# Patient Record
Sex: Female | Born: 1960 | Race: White | Hispanic: No | Marital: Married | Smoking: Current every day smoker
Health system: Southern US, Community
[De-identification: ages and names within clinical notes are randomized; demographics above are authoritative.]

## PROBLEM LIST (undated history)

## (undated) DIAGNOSIS — Z765 Malingerer [conscious simulation]: Secondary | ICD-10-CM

## (undated) DIAGNOSIS — M5136 Other intervertebral disc degeneration, lumbar region: Secondary | ICD-10-CM

## (undated) DIAGNOSIS — K279 Peptic ulcer, site unspecified, unspecified as acute or chronic, without hemorrhage or perforation: Secondary | ICD-10-CM

## (undated) DIAGNOSIS — G610 Guillain-Barre syndrome: Secondary | ICD-10-CM

## (undated) DIAGNOSIS — M1990 Unspecified osteoarthritis, unspecified site: Secondary | ICD-10-CM

## (undated) DIAGNOSIS — F419 Anxiety disorder, unspecified: Secondary | ICD-10-CM

## (undated) DIAGNOSIS — R768 Other specified abnormal immunological findings in serum: Secondary | ICD-10-CM

## (undated) DIAGNOSIS — H547 Unspecified visual loss: Secondary | ICD-10-CM

## (undated) DIAGNOSIS — K760 Fatty (change of) liver, not elsewhere classified: Secondary | ICD-10-CM

## (undated) DIAGNOSIS — R569 Unspecified convulsions: Secondary | ICD-10-CM

## (undated) DIAGNOSIS — F32A Depression, unspecified: Secondary | ICD-10-CM

## (undated) DIAGNOSIS — K7460 Unspecified cirrhosis of liver: Secondary | ICD-10-CM

## (undated) DIAGNOSIS — F1011 Alcohol abuse, in remission: Secondary | ICD-10-CM

## (undated) DIAGNOSIS — M549 Dorsalgia, unspecified: Secondary | ICD-10-CM

## (undated) DIAGNOSIS — E079 Disorder of thyroid, unspecified: Secondary | ICD-10-CM

## (undated) DIAGNOSIS — G8929 Other chronic pain: Secondary | ICD-10-CM

## (undated) DIAGNOSIS — F329 Major depressive disorder, single episode, unspecified: Secondary | ICD-10-CM

## (undated) DIAGNOSIS — K2210 Ulcer of esophagus without bleeding: Secondary | ICD-10-CM

## (undated) DIAGNOSIS — G629 Polyneuropathy, unspecified: Secondary | ICD-10-CM

## (undated) DIAGNOSIS — R109 Unspecified abdominal pain: Secondary | ICD-10-CM

## (undated) DIAGNOSIS — I9589 Other hypotension: Secondary | ICD-10-CM

## (undated) DIAGNOSIS — M51369 Other intervertebral disc degeneration, lumbar region without mention of lumbar back pain or lower extremity pain: Secondary | ICD-10-CM

## (undated) DIAGNOSIS — I519 Heart disease, unspecified: Secondary | ICD-10-CM

## (undated) DIAGNOSIS — N390 Urinary tract infection, site not specified: Secondary | ICD-10-CM

## (undated) DIAGNOSIS — D369 Benign neoplasm, unspecified site: Secondary | ICD-10-CM

## (undated) HISTORY — PX: FEMUR FRACTURE SURGERY: SHX633

## (undated) HISTORY — PX: TUBAL LIGATION: SHX77

## (undated) HISTORY — PX: ANKLE SURGERY: SHX546

## (undated) HISTORY — DX: Peptic ulcer, site unspecified, unspecified as acute or chronic, without hemorrhage or perforation: K27.9

## (undated) HISTORY — DX: Unspecified cirrhosis of liver: K74.60

## (undated) HISTORY — PX: CHOLECYSTECTOMY: SHX55

## (undated) HISTORY — PX: BACK SURGERY: SHX140

## (undated) HISTORY — DX: Benign neoplasm, unspecified site: D36.9

---

## 2000-07-29 ENCOUNTER — Encounter: Admission: RE | Admit: 2000-07-29 | Discharge: 2000-07-29 | Payer: Self-pay | Admitting: Neurosurgery

## 2000-10-27 ENCOUNTER — Encounter: Admission: RE | Admit: 2000-10-27 | Discharge: 2000-12-12 | Payer: Self-pay | Admitting: Anesthesiology

## 2003-06-20 ENCOUNTER — Other Ambulatory Visit: Admission: RE | Admit: 2003-06-20 | Discharge: 2003-06-20 | Payer: Self-pay | Admitting: Obstetrics and Gynecology

## 2004-01-31 ENCOUNTER — Ambulatory Visit (HOSPITAL_COMMUNITY): Admission: RE | Admit: 2004-01-31 | Discharge: 2004-01-31 | Payer: Self-pay | Admitting: Obstetrics and Gynecology

## 2004-01-31 ENCOUNTER — Encounter (INDEPENDENT_AMBULATORY_CARE_PROVIDER_SITE_OTHER): Payer: Self-pay | Admitting: Specialist

## 2006-04-09 ENCOUNTER — Ambulatory Visit: Payer: Self-pay | Admitting: Cardiology

## 2007-12-25 ENCOUNTER — Inpatient Hospital Stay (HOSPITAL_COMMUNITY): Admission: EM | Admit: 2007-12-25 | Discharge: 2008-01-04 | Payer: Self-pay | Admitting: Emergency Medicine

## 2008-01-04 ENCOUNTER — Inpatient Hospital Stay (HOSPITAL_COMMUNITY)
Admission: RE | Admit: 2008-01-04 | Discharge: 2008-02-01 | Payer: Self-pay | Admitting: Physical Medicine & Rehabilitation

## 2008-01-04 ENCOUNTER — Ambulatory Visit: Payer: Self-pay | Admitting: Physical Medicine & Rehabilitation

## 2008-02-29 ENCOUNTER — Encounter
Admission: RE | Admit: 2008-02-29 | Discharge: 2008-05-23 | Payer: Self-pay | Admitting: Physical Medicine & Rehabilitation

## 2008-03-01 ENCOUNTER — Ambulatory Visit: Payer: Self-pay | Admitting: Physical Medicine & Rehabilitation

## 2008-04-27 ENCOUNTER — Ambulatory Visit: Payer: Self-pay | Admitting: Physical Medicine & Rehabilitation

## 2008-05-23 ENCOUNTER — Encounter
Admission: RE | Admit: 2008-05-23 | Discharge: 2008-08-21 | Payer: Self-pay | Admitting: Physical Medicine & Rehabilitation

## 2008-05-24 ENCOUNTER — Ambulatory Visit: Payer: Self-pay | Admitting: Physical Medicine & Rehabilitation

## 2008-06-13 ENCOUNTER — Encounter: Admission: RE | Admit: 2008-06-13 | Discharge: 2008-08-14 | Payer: Self-pay | Admitting: Ophthalmology

## 2008-06-20 ENCOUNTER — Ambulatory Visit: Payer: Self-pay | Admitting: Physical Medicine & Rehabilitation

## 2008-07-25 ENCOUNTER — Ambulatory Visit: Payer: Self-pay | Admitting: Physical Medicine & Rehabilitation

## 2008-08-18 ENCOUNTER — Encounter
Admission: RE | Admit: 2008-08-18 | Discharge: 2008-11-16 | Payer: Self-pay | Admitting: Physical Medicine & Rehabilitation

## 2008-08-21 ENCOUNTER — Ambulatory Visit: Payer: Self-pay | Admitting: Physical Medicine & Rehabilitation

## 2008-09-25 ENCOUNTER — Ambulatory Visit: Payer: Self-pay | Admitting: Physical Medicine & Rehabilitation

## 2008-10-24 ENCOUNTER — Ambulatory Visit: Payer: Self-pay | Admitting: Physical Medicine & Rehabilitation

## 2008-11-17 ENCOUNTER — Encounter
Admission: RE | Admit: 2008-11-17 | Discharge: 2008-12-25 | Payer: Self-pay | Admitting: Physical Medicine & Rehabilitation

## 2008-11-24 ENCOUNTER — Ambulatory Visit: Payer: Self-pay | Admitting: Physical Medicine & Rehabilitation

## 2008-12-04 ENCOUNTER — Emergency Department (HOSPITAL_COMMUNITY): Admission: EM | Admit: 2008-12-04 | Discharge: 2008-12-04 | Payer: Self-pay | Admitting: Emergency Medicine

## 2008-12-25 ENCOUNTER — Ambulatory Visit: Payer: Self-pay | Admitting: Physical Medicine & Rehabilitation

## 2009-06-07 ENCOUNTER — Inpatient Hospital Stay (HOSPITAL_COMMUNITY): Admission: EM | Admit: 2009-06-07 | Discharge: 2009-06-09 | Payer: Self-pay | Admitting: Emergency Medicine

## 2009-06-07 ENCOUNTER — Ambulatory Visit: Payer: Self-pay | Admitting: Cardiology

## 2009-10-01 ENCOUNTER — Emergency Department (HOSPITAL_COMMUNITY): Admission: EM | Admit: 2009-10-01 | Discharge: 2009-10-01 | Payer: Self-pay | Admitting: Emergency Medicine

## 2009-10-02 ENCOUNTER — Inpatient Hospital Stay (HOSPITAL_COMMUNITY): Admission: EM | Admit: 2009-10-02 | Discharge: 2009-10-03 | Payer: Self-pay | Admitting: Emergency Medicine

## 2010-05-09 ENCOUNTER — Ambulatory Visit
Admission: RE | Admit: 2010-05-09 | Discharge: 2010-05-09 | Payer: Self-pay | Source: Home / Self Care | Attending: Internal Medicine | Admitting: Internal Medicine

## 2010-06-30 LAB — COMPREHENSIVE METABOLIC PANEL WITH GFR
ALT: 26 U/L (ref 0–35)
AST: 25 U/L (ref 0–37)
Albumin: 3.9 g/dL (ref 3.5–5.2)
Alkaline Phosphatase: 77 U/L (ref 39–117)
BUN: 4 mg/dL — ABNORMAL LOW (ref 6–23)
CO2: 31 meq/L (ref 19–32)
Calcium: 8.9 mg/dL (ref 8.4–10.5)
Chloride: 99 meq/L (ref 96–112)
Creatinine, Ser: 0.97 mg/dL (ref 0.4–1.2)
GFR calc non Af Amer: >60 mL/min
Glucose, Bld: 139 mg/dL — ABNORMAL HIGH (ref 70–99)
Potassium: 3.6 meq/L (ref 3.5–5.1)
Sodium: 137 meq/L (ref 135–145)
Total Bilirubin: 0.3 mg/dL (ref 0.3–1.2)
Total Protein: 7.4 g/dL (ref 6.0–8.3)

## 2010-06-30 LAB — URINALYSIS, ROUTINE W REFLEX MICROSCOPIC
Bilirubin Urine: NEGATIVE
Bilirubin Urine: NEGATIVE
Glucose, UA: NEGATIVE mg/dL
Glucose, UA: NEGATIVE mg/dL
Ketones, ur: NEGATIVE mg/dL
Ketones, ur: NEGATIVE mg/dL
Leukocytes, UA: NEGATIVE
Nitrite: NEGATIVE
Nitrite: NEGATIVE
Protein, ur: NEGATIVE mg/dL
Specific Gravity, Urine: 1.025 (ref 1.005–1.030)
Specific Gravity, Urine: 1.025 (ref 1.005–1.030)
Urobilinogen, UA: 0.2 mg/dL (ref 0.0–1.0)
Urobilinogen, UA: 0.2 mg/dL (ref 0.0–1.0)
pH: 5.5 (ref 5.0–8.0)
pH: 5.5 (ref 5.0–8.0)

## 2010-06-30 LAB — DIFFERENTIAL
Basophils Absolute: 0.1 10*3/uL (ref 0.0–0.1)
Basophils Absolute: 0.1 K/uL (ref 0.0–0.1)
Basophils Absolute: 0.2 10*3/uL — ABNORMAL HIGH (ref 0.0–0.1)
Basophils Relative: 0 % (ref 0–1)
Basophils Relative: 1 % (ref 0–1)
Eosinophils Absolute: 0 10*3/uL (ref 0.0–0.7)
Eosinophils Absolute: 0 K/uL (ref 0.0–0.7)
Eosinophils Relative: 0 % (ref 0–5)
Eosinophils Relative: 0 % (ref 0–5)
Lymphocytes Relative: 13 % (ref 12–46)
Lymphocytes Relative: 14 % (ref 12–46)
Lymphs Abs: 1.5 K/uL (ref 0.7–4.0)
Monocytes Absolute: 0.3 10*3/uL (ref 0.1–1.0)
Monocytes Absolute: 0.7 10*3/uL (ref 0.1–1.0)
Monocytes Absolute: 0.7 K/uL (ref 0.1–1.0)
Monocytes Relative: 3 % (ref 3–12)
Monocytes Relative: 6 % (ref 3–12)
Monocytes Relative: 6 % (ref 3–12)
Neutro Abs: 10.2 10*3/uL — ABNORMAL HIGH (ref 1.7–7.7)
Neutro Abs: 9.7 K/uL — ABNORMAL HIGH (ref 1.7–7.7)
Neutrophils Relative %: 71 % (ref 43–77)
Neutrophils Relative %: 81 % — ABNORMAL HIGH (ref 43–77)

## 2010-06-30 LAB — CBC
HCT: 37.5 % (ref 36.0–46.0)
HCT: 39.6 % (ref 36.0–46.0)
Hemoglobin: 12.2 g/dL (ref 12.0–15.0)
Hemoglobin: 12.7 g/dL (ref 12.0–15.0)
Hemoglobin: 13.3 g/dL (ref 12.0–15.0)
MCHC: 33.9 g/dL (ref 30.0–36.0)
MCV: 97.8 fL (ref 78.0–100.0)
MCV: 99 fL (ref 78.0–100.0)
Platelets: 172 10*3/uL (ref 150–400)
RBC: 3.67 MIL/uL — ABNORMAL LOW (ref 3.87–5.11)
RBC: 3.78 MIL/uL — ABNORMAL LOW (ref 3.87–5.11)
RBC: 4 MIL/uL (ref 3.87–5.11)
RDW: 13.9 % (ref 11.5–15.5)
RDW: 14.2 % (ref 11.5–15.5)

## 2010-06-30 LAB — BASIC METABOLIC PANEL WITH GFR
BUN: 6 mg/dL (ref 6–23)
CO2: 27 meq/L (ref 19–32)
Calcium: 7.9 mg/dL — ABNORMAL LOW (ref 8.4–10.5)
Chloride: 100 meq/L (ref 96–112)
Creatinine, Ser: 1.29 mg/dL — ABNORMAL HIGH (ref 0.4–1.2)
GFR calc non Af Amer: 44 mL/min — ABNORMAL LOW
Glucose, Bld: 112 mg/dL — ABNORMAL HIGH (ref 70–99)
Potassium: 4.5 meq/L (ref 3.5–5.1)
Sodium: 136 meq/L (ref 135–145)

## 2010-06-30 LAB — URINE MICROSCOPIC-ADD ON

## 2010-06-30 LAB — BLOOD GAS, ARTERIAL
Bicarbonate: 25.9 mEq/L — ABNORMAL HIGH (ref 20.0–24.0)
O2 Content: 6 L/min
Patient temperature: 37
TCO2: 23.6 mmol/L (ref 0–100)
TCO2: 24.6 mmol/L (ref 0–100)
pCO2 arterial: 55.7 mmHg — ABNORMAL HIGH (ref 35.0–45.0)
pCO2 arterial: 67.8 mmHg (ref 35.0–45.0)
pH, Arterial: 7.206 — ABNORMAL LOW (ref 7.350–7.400)
pH, Arterial: 7.286 — ABNORMAL LOW (ref 7.350–7.400)
pO2, Arterial: 69.8 mmHg — ABNORMAL LOW (ref 80.0–100.0)

## 2010-06-30 LAB — RAPID URINE DRUG SCREEN, HOSP PERFORMED
Amphetamines: NOT DETECTED
Barbiturates: NOT DETECTED
Benzodiazepines: POSITIVE — AB
Cocaine: NOT DETECTED
Opiates: POSITIVE — AB
Tetrahydrocannabinol: NOT DETECTED

## 2010-06-30 LAB — URINE CULTURE
Colony Count: NO GROWTH
Culture: NO GROWTH

## 2010-06-30 LAB — BASIC METABOLIC PANEL
CO2: 27 mEq/L (ref 19–32)
Calcium: 8 mg/dL — ABNORMAL LOW (ref 8.4–10.5)
Chloride: 101 mEq/L (ref 96–112)
Creatinine, Ser: 0.62 mg/dL (ref 0.4–1.2)
GFR calc Af Amer: >60 mL/min (ref >60)
Glucose, Bld: 91 mg/dL (ref 70–99)

## 2010-06-30 LAB — PREGNANCY, URINE: Preg Test, Ur: NEGATIVE

## 2010-06-30 LAB — GLUCOSE, CAPILLARY
Glucose-Capillary: 115 mg/dL — ABNORMAL HIGH (ref 70–99)
Glucose-Capillary: 116 mg/dL — ABNORMAL HIGH (ref 70–99)

## 2010-06-30 LAB — LIPASE, BLOOD: Lipase: 16 U/L (ref 11–59)

## 2010-06-30 LAB — MRSA PCR SCREENING: MRSA by PCR: NEGATIVE

## 2010-07-03 LAB — LIPID PANEL
Cholesterol: 167 mg/dL (ref 0–200)
HDL: 32 mg/dL — ABNORMAL LOW (ref >39)
LDL Cholesterol: 99 mg/dL (ref 0–99)
Triglycerides: 180 mg/dL — ABNORMAL HIGH (ref <150)

## 2010-07-03 LAB — COMPREHENSIVE METABOLIC PANEL
AST: 31 U/L (ref 0–37)
Albumin: 3.4 g/dL — ABNORMAL LOW (ref 3.5–5.2)
Calcium: 7.8 mg/dL — ABNORMAL LOW (ref 8.4–10.5)
Chloride: 100 mEq/L (ref 96–112)
Creatinine, Ser: 0.79 mg/dL (ref 0.4–1.2)
GFR calc Af Amer: >60 mL/min (ref >60)

## 2010-07-03 LAB — GLUCOSE, CAPILLARY
Glucose-Capillary: 74 mg/dL (ref 70–99)
Glucose-Capillary: 80 mg/dL (ref 70–99)

## 2010-07-03 LAB — DIFFERENTIAL
Basophils Absolute: 0.3 10*3/uL — ABNORMAL HIGH (ref 0.0–0.1)
Basophils Relative: 2 % — ABNORMAL HIGH (ref 0–1)
Eosinophils Relative: 0 % (ref 0–5)
Lymphocytes Relative: 18 % (ref 12–46)
Lymphs Abs: 1.9 10*3/uL (ref 0.7–4.0)
Monocytes Absolute: 0.9 10*3/uL (ref 0.1–1.0)
Neutro Abs: 10.9 10*3/uL — ABNORMAL HIGH (ref 1.7–7.7)
Neutrophils Relative %: 72 % (ref 43–77)

## 2010-07-03 LAB — CARDIAC PANEL(CRET KIN+CKTOT+MB+TROPI)
CK, MB: 8.6 ng/mL (ref 0.3–4.0)
Relative Index: 2.5 (ref 0.0–2.5)
Total CK: 349 U/L — ABNORMAL HIGH (ref 7–177)

## 2010-07-03 LAB — BASIC METABOLIC PANEL
CO2: 29 mEq/L (ref 19–32)
CO2: 30 mEq/L (ref 19–32)
Calcium: 8.3 mg/dL — ABNORMAL LOW (ref 8.4–10.5)
Creatinine, Ser: 1.42 mg/dL — ABNORMAL HIGH (ref 0.4–1.2)
GFR calc Af Amer: >60 mL/min (ref >60)
GFR calc non Af Amer: 39 mL/min — ABNORMAL LOW (ref >60)
GFR calc non Af Amer: >60 mL/min (ref >60)
Glucose, Bld: 117 mg/dL — ABNORMAL HIGH (ref 70–99)
Glucose, Bld: 84 mg/dL (ref 70–99)
Potassium: 3.7 mEq/L (ref 3.5–5.1)
Sodium: 134 mEq/L — ABNORMAL LOW (ref 135–145)

## 2010-07-03 LAB — CBC
MCHC: 33.1 g/dL (ref 30.0–36.0)
MCV: 97.3 fL (ref 78.0–100.0)
Platelets: 166 10*3/uL (ref 150–400)
Platelets: 195 10*3/uL (ref 150–400)
RDW: 15.2 % (ref 11.5–15.5)
WBC: 10.5 10*3/uL (ref 4.0–10.5)

## 2010-07-03 LAB — URINALYSIS, ROUTINE W REFLEX MICROSCOPIC
Bilirubin Urine: NEGATIVE
Leukocytes, UA: NEGATIVE
Nitrite: NEGATIVE
Specific Gravity, Urine: >1.03 — ABNORMAL HIGH (ref 1.005–1.030)
Urobilinogen, UA: 0.2 mg/dL (ref 0.0–1.0)

## 2010-07-03 LAB — PHOSPHORUS: Phosphorus: 2.2 mg/dL — ABNORMAL LOW (ref 2.3–4.6)

## 2010-07-03 LAB — TROPONIN I: Troponin I: 0.19 ng/mL — ABNORMAL HIGH (ref 0.00–0.06)

## 2010-07-03 LAB — CK TOTAL AND CKMB (NOT AT ARMC): CK, MB: 13.4 ng/mL (ref 0.3–4.0)

## 2010-07-03 LAB — URINE MICROSCOPIC-ADD ON

## 2010-08-27 NOTE — Group Therapy Note (Signed)
[name], TEMPESTA                 ACCOUNT NO.:  [id number]   MEDICAL RECORD NO.:  [id number]         PATIENT TYPE:  PINP   LOCATION:  A304                          FACILITY:  [facility]   PHYSICIAN:  Margaretmary Dys, M.D.DATE OF BIRTH:  June 03, 1960   DATE OF PROCEDURE:  01/02/2008  DATE OF DISCHARGE:                                 PROGRESS NOTE   SUBJECTIVE:  The patient has no complaints today.  She says she does  have some back pain, but rates about 2/10.  Patient is awaiting  placement.  The patient will likely need some rehab.  She continues to  have weakness in both lower extremities.   PHYSICAL EXAM:  CONSTITUTIONAL:  Shows conscious, alert, comfortable not  in acute distress.  Well-oriented in time, place and person.  VITAL SIGNS:  Blood pressure was 101/71 with a pulse of 78, respirations  12, temperature 98.6 degrees Fahrenheit, oxygen saturation was 96% on  room air.  HEENT exam normocephalic, atraumatic.  Oral mucosa was moist with no  exudates.  NECK:  Supple.  No JVD, lymphadenopathy.  LUNGS:  Clear clinically good air entry bilaterally.  '  HEART:  S1, S2 regular no murmurs, gallops or rubs.  ABDOMEN:  Soft, nontender.  Bowel sounds positive.  No masses palpable.  EXTREMITIES:  No edema.  No calf induration or tenderness.  CNS:  Exam the patient has bilateral lower extremity weakness about 4/5  strength.  Sensation was markedly reduced bilaterally.   LABORATORY/DIAGNOSTIC DATA:  There were no labs obtained today.  The  prior labs were unremarkable dated 11.   ASSESSMENT/PLAN:  1. 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  2. Urinary tract infection.  3. Hepatitis C infection.   PLAN:  1. The patient is awaiting placement for rehabilitation.  2. The patient has completed intravenous  immunoglobulin treatment.  3. The patient is to follow up in the future with gastroenterology for      hepatitis status.  4. Will continue with  physical therapy here while awaiting placement.  5. Her urinary tract infection has resolved.      Margaretmary Dys, M.D.  Electronically Signed     AM/MEDQ  D:  01/02/2008  T:  01/02/2008  Job:  [id number]

## 2010-08-27 NOTE — Consult Note (Signed)
[name], [name]                 ACCOUNT NO.:  [id number]   MEDICAL RECORD NO.:  [id number]          PATIENT TYPE:  IPS   LOCATION:  4037                         FACILITY:  [facility]   PHYSICIAN:  Melvyn Novas, M.D.  DATE OF BIRTH:  Mar 15, 1961   DATE OF CONSULTATION:  01/10/2008  DATE OF DISCHARGE:                                 CONSULTATION   REASON FOR CONSULTATION:  This consultation note became available for e-  signature on 05-26-08, several month after dictation.  Bilateral lower extremity weakness.   HISTORY OF PRESENT ILLNESS:  This is a 50 year old woman with past  medical history of Guillain-Barre'syndrome , diagnosed  after  lumbar  puncture - CSF showed high protein of 101 mg and glucose of in  her CSF,  as well as white blood cells count of 53 , clinically  associated with progressive ascending polyneuropathy and weakness , loss  of deep tendon reflexes -three weeks prior to LP.    The patient had been diagnosed with neuropathy in [facility] by Dr. Ninetta Lights  and with Virl Cagey dr. Gerilyn Pilgrim at [facility] on December 22, 2007.  She was  treated with 5 days of IVIG for Guillain-Barre' but continued  to have bilateral lower extremity weakness, pain and numbness.  According to the patient, this progressive weakness initially started  approximately 8 weeks ago with a stomach flu.  As time progressed, she states that her upper and lower extremities  became progressively weaker and she had decreased sensation.  The night  prior to being admitted to [facility] the patient states she  had lost the ability to walk secondary to decreased strength.  She was  discharged from [facility] to Rehab here at [facility].  At this time, she complained of decreased strength in the bilateral  lower and upper extremities and decreased sensation in the distal lower  extremities, however, she does state that sensation has been decreased  due to polyneuropathy in  her distal lower extremities in the past.   PAST MEDICAL HISTORY:  Her past medical history is positive for:  1. Diabetes.  2. Hypothyroidism.  3. Neuropathy.  4. Chronic back pain.  5. Severe anxiety.  6. Fibromyalgia.  7. Vitamin D deficiency.  8. Degenerative disc disease L5-S1.   MEDICATIONS:  1. Xanax.  2. Baclofen.  3. Vitamin D3.  4. Ensure.  5. Lexapro.  6. Duragesic.  7. Heparin.  8. Synthroid.  9. Magnesium oxide.  10.Robaxin.  11.Pamelor.  12.Protonix.  13.Lyrica.  14.Ambien.  15.Diphenhydramine.   ALLERGIES:  DARVOCET, NEURONTIN, TYLENOL and CODEINE.   SOCIAL HISTORY:  The patient is a 50 year old caucasian female ,who has  a Fiance' , and lives with him. She feels supported.   REVIEW OF SYSTEMS:  All 15 systems are reviewed.  She is positive for  weakness, palpitations, numbness, polyneuropathy, anxiety, weight  change, there was no pain mentioned in the ROS.   PHYSICAL EXAMINATION:  VITAL SIGNS:  Blood pressure 98/66 mmHg , pulse  80 bpm , respiratory rate 20 min , temperature 98.1  fahrenheit  MENTAL STATUS: Alert and oriented, fluent speech with ability to repeat  and name. logorhoic and tangential in her answers, appears hypomanic,  slightly agitated. .  Carries out two step commands without any  problems.  Cranial nerves:  Pupils equal, round, reactive to light and  accommodation, finger perimetry documented normal visual fields as  recorded by Felicie Morn, PA.  CN: Conjugate gaze.  Equal pupils, reactive to light .Extraocular  muscles are intact.  Visual fields are intact to finger perimetry.  Face  is symmetrical for sensory and motor testing.  Tongue /Uvula in  midline  without tremor or fasciculations. Neck ROM intact.    Shoulder shrug is 5/5.  Coordination finger-to-nose to finger is  intact.  Heel-to-shin normal. Rapid alternating/ Fine motor movements  are slow.  Gait deferred as the patient is sitting in a whelchair.  Motor :bilateral  biceps flexion is 4/5.  Bilateral triceps extension is  4-/5.  Bilateral grip is weaker than expected, possible embellishment?.  Bilateral hip flexion and extension is 4-/5.  Bilateral  knee extension is 5/5  Bilateral knee flexion is 4-/5.  Bilateral plantar flexion and plantar extension is 5/5.  triceps  extension bilaterally was most predominantly weak muscle .  Deep tendon reflexes were absent throughout the bilateral upper  extremities, 0/4 bilateral lower extremities from patella to Achilles TR  and bilateral downgoing toes.  Drift:  drift in upper and lower  extremities with pronation and drift at the shoulder as well. .   Sensory; diffuse aches and pains- at onset time supposingly ascending,  now generalized. Lack of propioception and single filament stimulus in  lower extremeies most pronounced.     Patient is seen by neurologists in [place] and [place] and begun  treatment with both physicians, will need to follow up withher  established doctors.   LABORATORY DATA:  Positive for hepatitis C.  Vitamin B12 is 650.  TSH is  high at 11.38.  Hemoglobin A1C is 4.7.  Magnesium is normal.  Urinalysis  normal.  Sodium 135, potassium 3.7, chloride 105, bicarb 24, BUN 5,  creatinine 0.35, glucose 95, white blood cells 7.1, hemoglobin 10.1,  hematocrit 32.0 and platelet count 346,000  Imaging tests:  MRI and CT unremarkable -brain.  MRI of C-spine normal.   ASSESSMENT:  A 50 year old white female now approaching fifth week with  bilateral  lower extremity weakness , partial recovery after  five days  of IVIG,   The patient was diagnosed by Dr. Gerilyn Pilgrim at [facility]  and  received IVIG at [facility]. She will return to his care after  discharge.   DX  Due to having limited  response to IVIG  therapy consider diagnosis to  convert from AIDP- Guillan Barre Syndrome to CIDP if not recovered in 3  month.  May respond to protein plasmapheresis.  1. EMG nerve conduction  velocity study of upper and lower extremities,      which would also diagnose polyneuropathy, if present with Dr.      Marisa Sprinkles after her discharge .     ______________________________  Felicie Morn, PA-C      Melvyn Novas, M.D.  Electronically Signed    DS/MEDQ  D:  01/19/2008  T:  01/19/2008  Job:  [id number]

## 2010-08-27 NOTE — Group Therapy Note (Signed)
[name], [name]                 ACCOUNT NO.:  [id number]   MEDICAL RECORD NO.:  [id number]          PATIENT TYPE:  INP   LOCATION:  A304                          FACILITY:  [facility]   PHYSICIAN:  Osvaldo Shipper, MD     DATE OF BIRTH:  [birth year]   DATE OF PROCEDURE:  12/28/2007  DATE OF DISCHARGE:                                 PROGRESS NOTE   SUBJECTIVE:  The patient is feeling slightly better.  She still has  generalized pain.  No new complaints offered today.   OBJECTIVE:  VITAL SIGNS:  She has been afebrile, heart rate in the 70s,  respiratory rate 16, saturations 97% on room air, blood pressure 118/77.  LUNGS:  Clear to auscultation bilaterally.  CARDIOVASCULAR:  S1, S2 normal regular.  ABDOMEN:  Soft, nontender, nondistended.  Bowel sounds are present.  No  mass or organomegaly is appreciated.  EXTREMITIES:  Do not show any edema.  NEUROLOGICAL:  Examination still has 3/5 to 4/5 strength bilaterally in  the lower extremities.   LABORATORY DATA:  Her potassium today is 3.2.  Renal function is normal.   ASSESSMENT/PLAN:  1. Guillain-Barre Syndrome.  She is currently on IV IG treatment.      This is day 5 of her 5-day course.  She is undergoing physical      therapy.  Physical therapy thinks that her strength has improved      compared to last week.  She is getting daily FVCs and NIFs, which      are all satisfactory.  Vital capacity was slightly low yesterday.      Dr. Gerilyn Pilgrim has been following her as well.  2. She has completed her treatment for urinary tract infection and is      stable in that respect.  3. She has had hepatitis C reactive on her blood work because she had      mildly abnormal LFTs.  However HCV RNA quantitative did not detect      any of the virus.  So the hepatitis C reactivity was likely not      accurate.  She will require followup for this as well.  I will      discuss also with our gastroenterology folks here as to what these      results really  mean.  4. She also has a lot of anxiety for which she is on Xanax.  She is on      some pain medications with narcotics which she should continue as      well for now.   So, the patient appears to be slowly making progress.  I still think she  will need some skilled nursing facility for rehabilitation until she is  stable to transfer and ambulate.  However, there is an issue because the  patient does not have insurance.  The patient informed me today that she  has an application pending for Medicaid.  I will inform this to the  social worker and we will see what we can come up with.  The patient  should be  ready for discharge in the next day or two.   ADDENDUM:  Discussed the Hep C results with Dr. Jena Gauss and he believes that with  negative PCR the screening test was likely a false positive.      Osvaldo Shipper, MD  Electronically Signed     GK/MEDQ  D:  12/28/2007  T:  12/28/2007  Job:  [id number]

## 2010-08-27 NOTE — Group Therapy Note (Signed)
NAMEAMISADAI, [name]                 ACCOUNT NO.:  [id number]   MEDICAL RECORD NO.:  [id number]          PATIENT TYPE:  INP   LOCATION:  A304                          FACILITY:  [facility]   PHYSICIAN:  Kofi A. Gerilyn Pilgrim, M.D. DATE OF BIRTH:  1960/12/26   DATE OF PROCEDURE:  12/27/2007  DATE OF DISCHARGE:                                 PROGRESS NOTE   The patient reported her symptoms are about the same. She continues to  have numbness, tingling, and weakness involving the legs. She continues  to have subsequent pain. She has tolerated the third dose of IV  immunoglobulin without any problems.   Temperature 97.8, pulse 92, respirations 20, blood pressure 91/64. The  patient can count up to about 23 in a single breath. I do not see any  recent NIF or FVC. She has 3+ to 4- strength involving the upper  extremities. She is a little weaker in the legs, just barely can  go  against gravity, more like 2/5. She continues to be areflexic.   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 Continue with watching  for pulmonary function. Should be able to complete 5-day course of  intravenous immunoglobulin. She will likely need long-term  rehabilitation.      Kofi A. Gerilyn Pilgrim, M.D.  Electronically Signed     KAD/MEDQ  D:  12/28/2007  T:  12/28/2007  Job:  [id number]

## 2010-08-27 NOTE — Group Therapy Note (Signed)
NAMEMARIELL, [name]              ACCOUNT NO.:  [id number]   MEDICAL RECORD NO.:  [id number]         PATIENT TYPE:  PINP   LOCATION:  A304                          FACILITY:  [facility]   PHYSICIAN:  Kofi A. Gerilyn Pilgrim, M.D. DATE OF BIRTH:  1960/10/20   DATE OF PROCEDURE:  DATE OF DISCHARGE:                                 PROGRESS NOTE   She complains of multiple joint pain, fatigue, and achiness.  She is  awake and alert.  Her is -60 and FVC is 2.2 L.  She has 4+ strength in  upper extremities and 3 in the legs.   ASSESSMENT AND PLAN:  Guillain-Barre syndrome, status post 5 days of IV  immunoglobulins.  She is being evaluated for rehab.  She does have a  vitamin D level hydroxy of 12, is very low.  We will increase her  replacement to 3000 units.      Kofi A. Gerilyn Pilgrim, M.D.  Electronically Signed     KAD/MEDQ  D:  01/03/2008  T:  01/04/2008  Job:  [id number]

## 2010-08-27 NOTE — Assessment & Plan Note (Signed)
HISTORY OF PRESENT ILLNESS:  [name] is a 50 year old female who I am  familiar with from the inpatient hospitalization, she had a [facility].  She has Guillain-Barre syndrome with neuropathic  pain.  She has been going through outpatient rehabilitation following  discharge.  She has seen Dr. Karleen Hampshire from [facility] in regards  to her visual complaints and per her report has been diagnosed with what  sounds like an optic nerve problem related to Guillain-Barre syndrome.  She has been getting outpatient therapy at [facility].   I did review a urine drug screen performed May 24, 2008 which was  appropriate for the medications prescribed including fentanyl and  hydrocodone.   She has had no new problems in the interval time.   I will send a copy to Dr. Loney Hering in [place], [place].  Her average  pain is 8/10, interferes with activity at 7/10.  Her pain is in her arms  and legs primarily and less so on her back.  She is in a wheelchair but  uses a walker now more and more.  She could walk about 2 minutes at a  time with a walker.  She does not climb steps or drive yet.  She needs  some assist with dressing, bathing, toileting, meal prep, household  duties, and shopping.   REVIEW OF SYSTEMS:  Her other review of system is positive for  depression, anxiety, trouble walking, spasms, dizziness, numbness,  tremor, tingling, bladder control problem, weakness, constipation,  nausea, and limb swelling.   PAST MEDICAL HISTORY:  Borderline diabetes, thyroid problems and  seizures.   PAST SURGICAL HISTORY:  Cholecystectomy, tubal ligation.  She has had  back surgery in the past, has been on chronic hydrocodone prior to this.  She is divorced and lives with her new fiance.   PHYSICAL EXAMINATION:  Blood pressure 115/72, pulse 98, respirations 18,  O2 sat 98% on room air.  Overweight female in no acute stress.  Orientation x3.  She  can distinguish how many fingers I am holding up  and she can recognize me when I came in the room, but she states she  cannot tell details such as reading my name off my lab coat or eye  color.   Her motor strength is 4-/5 bilateral deltoid, biceps, triceps, and grip  as well as hip flexion, knee extension, and ankle dorsiflexion.  She has  mild ataxia in all four extremities.  She also has truncal ataxia  evident by poor standing balance.  She could stand with her legs far  apart, but cannot stand with them close together.  She could take a few  short steps without assistive device.   She has some diminishment in the sensation to pinprick in bilateral  upper and lower extremities.   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   PLAN:  We will continue with her physical therapy at [facility].  She states she is also in a [employer] program.   I will see her back in a another month or so.  We will continue physical  therapy.  We will continue her current medicines other than increasing  her Lyrica to 100 mg b.i.d., continue her Duragesic 75 q.72 h. and her  Norco 10/325 up to 5 tablets per day.   We may need to increase her Lyrica further  next visit.      Erick Colace, M.D.  Electronically Signed     AEK/MedQ  D:  06/20/2008 16:32:56  T:  06/21/2008 03:53:48  Job #:  [id number]   cc:   Casimiro Needle A. Karleen Hampshire, M.D.  Fax: [phone]

## 2010-08-27 NOTE — Group Therapy Note (Signed)
[name], [name]                 ACCOUNT NO.:  [id number]   MEDICAL RECORD NO.:  [id number]          PATIENT TYPE:  OBV   LOCATION:  A304                          FACILITY:  [facility]   PHYSICIAN:  Kofi A. Gerilyn Pilgrim, M.D. DATE OF BIRTH:  02/13/1961   DATE OF PROCEDURE:  12/25/2007  DATE OF DISCHARGE:                                 PROGRESS NOTE   CHIEF COMPLAINT:  The patient continues to complain of paresthesias and  numbness, total body pain and weakness.   PHYSICAL EXAMINATION:  GENERAL:  She is awake and alert.  VITAL SIGNS:  Temperature 97.9 degrees, pulse 75, respirations 20, blood  pressure 118/80.  HEENT:  She continues to have a plum red appearance of the cheek.  She  converses well.  LUNGS:  No dyspnea has been observed.  NEUROLOGIC:  Again, she is areflexic throughout.  She continues to have  athetoid movements of the hands bilaterally.  She is areflexic.  She has  3/5 weakness of the legs and 4/5 of the upper extremities.   Spinal fluid analysis showed WBC 5, RBC 53, glucose 53, protein 101.  Blood tests:  RPR nonreactive.  Other blood tests are pending.   ASSESSMENT/PLAN:  The spinal fluid analysis is classic for Guillain-  Barre syndrome  and therefore she will be treated as such.  We will  start her on IV immunoglobulins 400 mg per kg per day for five days.  She should be continued on deep venous thrombosis prophylaxis.   Her lung function so far looks pretty good.  Her negative inspiratory  force is 57 and FVC is 2.8 liters.  May want to repeat this over the  next few days.  Pulmonary function can be significantly impaired with  Guillain-Barre syndrome and therefore will have to monitor this closely  to gauge against respiratory failure.  Because of the risk of  tachycardia dysrhythmias, she will be placed on telemetry while she is  being treated.  Will also go ahead and replace her vitamin D.  We also  may want to check chemistries while she is getting the  infusion.      Kofi A. Gerilyn Pilgrim, M.D.  Electronically Signed     KAD/MEDQ  D:  12/25/2007  T:  12/25/2007  Job:  [id number]

## 2010-08-27 NOTE — Assessment & Plan Note (Signed)
[name] returns to clinic today for followup evaluation.  She is  accompanied in the office by her longstanding boyfriend.  The patient is  a 50 year old female with a history of neuropathy diagnosed 3 weeks  prior to admission with progressive bilateral lower extremity weakness  and inability to walk with involuntary movements of the right upper  extremity with weakness.  She was admitted to [facility] on  December 22, 2007, for workup.  She was noted to be hypotensive with  abnormal liver function tests and urinary tract infection on admission.  Hepatitis panel was positive for hep C antibody.  Hepatitis C RNA was  not detected.  Vitamin B12 levels were 650, and Neurology thought the  patient was experiencing Guillain-Barre syndrome.  Lumbar puncture  showed increased protein of 101 with glucose of 53 and red blood cells  53.  CT of the brain and MRI scan showed no acute changes.  Abdominal  ultrasound showed a fatty liver.  Vitamin D levels were noted to be low  at 12.  TSH was noted to be 7.1-8.  Thyroid panel showed TSH of 6.49  with T3 uptake of 29.2 and free T4 0.95.  The patient was treated with a  5-day course of IVIG per Neurology input.  She eventually stabilized,  and was moved to the rehabilitation unit at [facility] on  January 04, 2008.  She remained there through discharge on February 01, 2008.   Since discharge, the patient has been living at her home with her  longstanding boyfriend.  She is receiving physical therapy 3 times per  week and occupational therapy twice per week.  She is able to ambulate  with  minimal assist.  She does need some help with her ADLs,  specifically for her feet and back side.  She is continent of bowel and  bladder and has good appetite.  She did have nerve conduction studies  and EMG with Dr. Vickey Huger.  She is due to follow up with Dr. Vickey Huger at  the end of this week for results of those tests.   The patient reports  that she is not getting a sufficient relief from her  Norco at the present time.  She was on fentanyl and Norco in the  hospital, but we have to stop the fentanyl due to budgetary  restrictions.  She is approved for Medicaid at this time, and we can  resume the fentanyl, which she was using in the hospital.  She reports  that she is getting health worker 4 hours per day, 5 days per week, and  she is on CAP list related to her Medicaid.  Her father and brother have  not pitched into help her, and she basically has to rely on her  longstanding boyfriend.   MEDICATIONS:  1. Robaxin 750 mg q.i.d. p.r.n.  2. Synthroid 0.05 mg daily.  3. Ambien 10 mg daily.  4. Lexapro 10 mg daily.  5. Magnesium oxide 400 mg b.i.d.  6. Xanax 1 mg t.i.d. p.r.n.  7. Pamelor 25 mg daily.  8. Lyrica 50 mg b.i.d.  9. Stool softeners p.r.n.  10.Norco 10/325 one tablet 5 times per day.  11.Vitamin D 50,000 international units weekly.  12.Flexeril 10 mg t.i.d. p.r.n.   REVIEW OF SYSTEMS:  Positive for night sweats, skin rash, constipation,  and urinary retention.   PHYSICAL EXAMINATION:  GENERAL:  Well-appearing, middle-aged adult  female in mild-to-moderate acute discomfort.  VITAL SIGNS:  Blood pressure 113/77 with a pulse of 81, respiratory rate  18, and O2 saturation 100% on room air.  EXTREMITIES:  She is able to stand and with minimal assistant, ambulates  with flat-footed gait with double up right AFO on the right lower  extremity.  She requires minimal assist to ambulate, and she has poor  balance.  She generally has 4+/5 strength throughout the bilateral upper  extremities and 4/5 strength throughout the bilateral lower extremities  with dorsiflexion of the ankle on the right.   DIAGNOSES:  1. Status post Guillain-Barre syndrome with history of polyneuropathy,      slowly improving.  2. Anxiety disorder.  3. Hypothyroidism.  4. Vitamin D deficiency.  5. Hypomagnesemia.  6. Positive hepatitis C  antibody.   PLAN:  In the office today, we did refill the patient's Norco, allowing  her to use up to 5 times per day on an as needed basis.  We also  refilled her fentanyl patch to 50 mcg trans, which she was using while  hospitalized.  She has Medicaid and has sufficient coverage for that at  the present time.  She will continue in-home health therapies.  We are  making a referral to Dr. Karleen Hampshire, one of the local neuro-  ophthalmologists.  She apparently went to see a ophthalmologist locally,  and was told that they do not feel that any corrective lenses would help  her vision.  They are suggesting that it may be related to the Guillain-  Barre syndrome or even possibly malingering.  We will plan on seeing the  patient in followup at this office in approximately 2 months' time.  She  will be following up with Dr. Vickey Huger as noted above the end of this  week.           ______________________________  Ellwood Dense, M.D.     DC/MedQ  D:  03/01/2008 09:52:44  T:  03/01/2008 21:57:42  Job #:  [id number]

## 2010-08-27 NOTE — Assessment & Plan Note (Signed)
A 50 year old female with Guillain-Barre syndrome and neuropathic pain.  She has optic nerve involvement, has been seeing Neuro-Ophthalmology for  this.  She has been getting outpatient rehabilitation PT, OT, at  [facility].   Her average pain is 8/10, which is actually equal to last time.  However, she has been off her pain medicines for about 4 days, did not  make her visit.  She does have transportation issues, rather difficult  for her to get here.  She states that as a result of running out of her  pain medicine, she is having more difficulty getting out of bed, needs  some assistance.  Her Oswestry score, however, is marginally worsened  from 74% to 80%.   REVIEW OF SYSTEMS:  Positive for numbness; tremor; tingling primarily in  her legs, but also on her arms; depression; anxiety, trouble walking;  spasms; constipation; and nausea.   ALLERGIES:  EGGS, TYLENOL, CODEINE, and DARVOCET.   She is divorced, lives with her fiance.   PAST HISTORY:  Significant tubal ligation, cholecystectomy, borderline  diabetes as well as lower extremity fractures, seizures, hypothyroidism.   CURRENT PAIN MEDICATIONS:  She is just on Lyrica 100 b.i.d. at the  current time.   Blood pressure 126/70, pulse 102, respirations 18, and O2 sat 94% on  room air.  General, no acute stress.  Orientation x3.  Affect is alert.  She is in wheelchair and also uses AFOs.  She takes a few steps without  assistive device with handheld assist.   Her lower extremity strength is 4/5 bilateral knee extensors.  Her ankle  dorsiflexor on the right is 2-, on the left is 3.  Her foot evertor on  the right is 2-, on the left is 3+.  Her sensation is absent below the  knees, but present at pinprick in the fingers.  She has no evidence of  intrinsic atrophy.  No evidence of fasciculations.   I reviewed MRIs from the last fall.  C-spine, L-spine, and brain, these  were all normal.   IMPRESSION:  1.  Guillain-Barre syndrome, optic nerve involvement, severe      neuropathic pain.  She has had marginally worsened function off of      the high-dose narcotic analgesics, in fact less declined than I      expected.  Therefore, we will keep her off her Duragesics, she has      already withdrawn from that.  We will reinstate Norco at 6 tablets      today up from 5 and increase her Lyrica to 100 t.i.d.  2. In order to monitor disease progress and recovery, we will repeat      EMG/NCV for extremities focusing more on the lowers given her      increased symptomatology, particularly right lower.   Discussed with the patient, agrees with plan.     Erick Colace, M.D.  Electronically Signed    AEK/MedQ  D:  07/25/2008 10:19:11  T:  07/26/2008 00:39:46  Job #:  [id number]   cc:   Ernestine Conrad, MD

## 2010-08-27 NOTE — Consult Note (Signed)
[name], [name]                 ACCOUNT NO.:  [id number]   MEDICAL RECORD NO.:  [id number]          PATIENT TYPE:  OBV   LOCATION:  A304                          FACILITY:  [facility]   PHYSICIAN:  [name], M.D. DATE OF BIRTH:  11-25-1960   DATE OF CONSULTATION:  DATE OF DISCHARGE:                                 CONSULTATION   REASON FOR CONSULTATION:  Generalized weakness and inability to  ambulate.   HISTORY OF PRESENT ILLNESS:  This is a 50 year old white female who has  a rather complicated history and somewhat of a poor historian.  The  patient apparently has some baseline problems which has been worked up  by a neurologist in [place], [place], over a year ago.  She was  diagnosed with fibromyalgia at that time.  It appeared that she was also  diagnosed with neuropathy and was told that neuropathy was possibly due  to vitamin D deficiency.  She was told to take vitamin D.  She  reportedly has been taking it for the past year, although she stopped  the last several days or so.  The history initially reports also that  she has had a 2-4 week history of gait problems and leg weakness but it  appears that the weakness has been present for only about 2 weeks.  She  has been seen at [facility] several times for different reasons including  abdominal pain and a couple of times thought to be due to narcotics  causing gastroparesis. I do not see, in reviewing the [facility] notes,  that she was actually seen for generalized weakness and gait impairment.  There is some suggestion that she may have had a CT, MRI of the lumbar  region but I do not see that in the [facility] notes.  In this situation,  she apparently has had a 2-week history of leg weakness.  She reports  having facial numbness.  She reports what appears to be dystonic  movements of the hands.  These problems seem to be recent over her  baseline severe pain from fibromyalgia.  She also reports some problems  with  talking and mild problems with swallowing.   PAST MEDICAL HISTORY:  Significant for  1. Fibromyalgia.  2. Borderline diabetes.  3. Narcotics-induced gastroparesis.  4. Seizure disorder.  5. Neuropathy.  6. UTI.  7. Vitamin D deficiency.   PAST SURGICAL HISTORY:  1. Tubal ligation.  2. Cholecystectomy.  3. Back surgery.  4. Fusion of leg due to fracture.   MEDICATIONS ON ADMISSION:  Potassium, Macrobid, alprazolam, hydrocodone  7.5, Levaquin, Ambien.   ALLERGIES:  No known drug allergies.   REVIEW OF SYSTEMS:  As stated in history of present illness.  She  reportedly has had EMG done over a year ago by Dr. Benson Dalton and results  are unknown.  Patient has a lot of concern that she may have MS,  although imaging seemed not to suggest this.  No headaches are reported.  No dyspnea is reported.   FAMILY HISTORY:  Positive for hypertension and coronary disease.   SOCIAL HISTORY:  She is married with her husband.  She has currently 2  grandchildren.   PHYSICAL EXAMINATION:  GENERAL APPEARANCE:  A moderately overweight lady  in no acute distress.  HEENT:  Neck is supple.  Head is normocephalic and atraumatic.  ABDOMEN:  Soft.  EXTREMITIES:  No significant edema.  NEUROLOGIC:  Mentation:  She is awake and alert.  She converses well.  Speech, language and cognition are intact.  Cranial nerve evaluation:  Pupils are equal, round, reactive to light and accommodation.  Extraocular movements are full.  Face and mouth is symmetric.  Tongue is  midline.  Uvula midline.  Shoulder shrugs normal.  Patient did have sort  of a bright red-like appearance over cheeks bilaterally, unclear  significance.  Motor examination shows that she has significant weakness  of the legs, 3/5.  Upper extremities are 3+ to 4-.  Reflexes are absent  throughout.  Plantar reflexes downgoing.  Coordination:  She seemed to  have dystonic-like movements involving the hands.  This seemed somewhat  unusual.  She also  has some past-pointing and dysmetria of the upper  extremities.  There is no rigidity or cogwheeling.  No tremors.   LABORATORY DATA:  Her CT scan is negative.  Her MRI of the brain is  reviewed in person and shows some significant atrophy for the patient's  age.  There is no white matter lesions, no acute process noted.  Blood  vessels on T2 looks good.   CPK 16.  TSH 7.  HCG negative.  WBC 6.5, hemoglobin 12.2, platelet count  298.  Sodium 138, potassium 3.4, chloride 101, CO2 27, BUN 6, creatinine  0.4, glucose 86.  Alkaline phosphatase 148, LFT 39, AST 60, total  bilirubin 0.5.  Urinalysis with bilirubin moderate, glucose 100,  nitrites negative, specific gravity 1030.  Microscopic wbc 0-2 and rbc 0-  2.   ASSESSMENT:  1. Acute gait impairment with areflexia, weakness, and ataxia. 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  2. She likely has a baseline neuropathy and gait problems.  3. Fibromyalgia.  4. Vitamin D deficiency.   RECOMMENDATIONS:  1. We will try to get some of the notes, including EMG and baseline      neurological exam done by Dr. Benson Dalton over a year ago.  This should      be instructive.  2. She needs to have a spinal tap to further evaluate for a Guillain-      Barre syndrome.  3. Deep venous thrombosis prophylaxis.  We will go ahead and give her      compression boots.  4. Additional blood tests for heavy metal, B12 level, homocystine      level and vitamin D level.  5. She will likely need to have IV immunoglobulins.  We will arrange      for this once spinal tap has been done.      She will need the 400 mg/kg per day x5 days.  Will also recommend      physical therapy.  She will need to have a baseline pulmonary      function as this could progress to affect her breathing.  We need      to follow this.   Thank you for this  consultation.      [name], M.D.  Electronically Signed  KAD/MEDQ  D:  12/24/2007  T:  12/24/2007  Job:  [id number]

## 2010-08-27 NOTE — Assessment & Plan Note (Signed)
A 50 year old female, who was previously diagnosed with Guillain-Barre  syndrome went through inpatient rehabilitation under Dr. Thomasena Edis.  She  was treated with IVIG which was not helpful for her.  She had a EMG per  Dr. 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  I repeated her EMG  on Aug 21, 2008, which showed absent sensory conductions in the upper  and lower extremity, but relatively intact motor.  There was some  minimal swelling of the motor conduction, but some of this she had  particularly slowing of her motor conduction.  She also had evidence of  left peroneal neuropathy.   She has been going to [facility] for further evaluation of her visual  complaints.  She was told that they do not think this is due to Alene Mires and looking into perhaps an optic nerve tumor.  In terms, her EMG  the differential for her sensory neuronopathy was basically Sjogren  syndrome verses paraneoplastic syndrome, and she will be seeing  Rheumatology for this.   Her pain is controlled has along the oxycodone works.  Lyrica seems to  help as well, but still latter in 10/10 level, she has some difficulty  with dressing, bathing, toileting, meal prep, household duties,  shopping.   REVIEW OF SYSTEMS:  Basically positive for everything under neuropsych,  GI, but negative for suicidal thoughts.   SOCIAL HISTORY:  Single, lives with her fiance.  She smokes at least a  pack a day.   Examination, she has decreased sensation to pinprick in the right lower  extremity greater than left lower extremity.  She has intact light touch  in the upper extremities.  She has intact proprioception in the left  lower extremity, but absent on the right lower extremity.  Her muscle  bulk is good.  There is no evidence of intrinsic atrophy.  No evidence  of fasciculations.   IMPRESSION:  Sensory neuronopathy.  We will send her to  Rheumatology to  develop Sjogren.  Given that there is some discussion from her  neuroophthalmologist that her visual deficits maybe tumor related that  this may reflect a paraneoplastic syndrome.  We will defer to Dr.  Kellie Simmering in terms of blood testing, however, potentially useful once at  least from a EMG standpoint would be anti-HU antibody, ANA, SSA, SSB.   I will see her back in about 2 months nursing visit in 1 month.      Erick Colace, M.D.  Electronically Signed     AEK/MedQ  D:  10/24/2008 10:06:41  T:  10/25/2008 01:58:29  Job #:  [id number]   cc:   Aundra Dubin, M.D.  [address]  [place]  [place] [address]

## 2010-08-27 NOTE — Assessment & Plan Note (Signed)
A 50 year old female with onset of GI symptoms, nausea, vomiting, UTI  August 2009.  She had couple weeks afterwards of gait disturbance and  weakness, slurred speech, as well numbness.  She was seen by Neurology  with diagnosis clinically with Guillain-Barre treated with IVIG which  was not particularly helpful for her.  She had EMG and CV per Dr. Terrace Arabia at  [facility] on February 18, 2008.  She had abnormal  sensory conductions, but normal motor conductions.  No temporal  dispersion, absent atrial reflexes.  Needle examination were normal.  The patient has complained of continued numbness.  She was read as  sensory neuronopathy rather than a Guillain-Barre syndrome and the  differential include a Sjogren syndrome as well as paraneoplastic  syndrome.   Because of persistent complaints, a repeat study was performed Aug 21, 2008.  Once again sensory responses were absent.  There is mild  prolongation of bilateral median motor responses which is felt to be  more due to a concomitant carpal tunnel.  There was mild slowing at the  right tibial and right peroneal motor, but this could be explained by  cold and temperature.  The only motor study that was significantly  abnormal was left peroneal, but EMG of the tibialis anterior muscles  normal.  It felt to be a conduction block at the fibular head most  likely.   Clinically, no weakness.   Examination, she has decreased sensation to pin, bilateral upper and  lower extremities in a glove stocking fashion.  She has absent deep  tendon reflexes throughout with the exception of the right knee.  She  has decreased sensitivity to light touch in the lower extremities, but  intact in the upper extremities.   Muscle bulk is good.  There is no evidence of fasciculations.  Gait is  wide base with a sensory ataxia.   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  I would like to send  her to Rheumatology  evaluate for Sjogren.  In addition, she has had some progressive visual  deficits.  In terms to her pain, we will change her from hydrocodone to  oxycodone 15 mg t.i.d.  She has been off her fentanyl patch.  She  continues on her Lyrica 100 mg t.i.d. which may be increased next visit.   Discussed in detail with the patient.  She agrees with plan.      Erick Colace, M.D.  Electronically Signed     AEK/MedQ  D:  09/25/2008 11:14:54  T:  09/26/2008 01:30:48  Job #:  [id number]

## 2010-08-27 NOTE — Group Therapy Note (Signed)
NAMESHAYLA, HEMING                 ACCOUNT NO.:  [id number]   MEDICAL RECORD NO.:  [id number]          PATIENT TYPE:  INP   LOCATION:  A304                          FACILITY:  [facility]   PHYSICIAN:  Kofi A. Gerilyn Pilgrim, M.D. DATE OF BIRTH:  1960/06/30   DATE OF PROCEDURE:  12/28/2007  DATE OF DISCHARGE:                                 PROGRESS NOTE   Patient essentially continues to have similar complaints of  paresthesias, weakness.   Temperature 98.5, blood pressure 118/77.  Her pulmonary function NIF is  -50 and FVC 1.5.  The FVC has gone down, but the NIF has only gone down  slightly.  She has tolerated the four doses of immunoglobulins and has  one more dose to go.  Strength remains about the same.  She lifts the  legs off the bed.  She has 3-4 strength in the upper extremities.   ASSESSMENT:  Guillain-Barre syndrome.  Continue with IV immunoglobulins,  physical placement since she will need to be placed for physical  therapy.      Kofi A. Gerilyn Pilgrim, M.D.  Electronically Signed     KAD/MEDQ  D:  12/29/2007  T:  12/29/2007  Job:  [id number]

## 2010-08-27 NOTE — H&P (Signed)
[name], Dalton                 ACCOUNT NO.:  [id number]   MEDICAL RECORD NO.:  [id number]          PATIENT TYPE:  IPS   LOCATION:  4037                         FACILITY:  [facility]   PHYSICIAN:  Ranelle Oyster, M.D.DATE OF BIRTH:  02/20/1961   DATE OF ADMISSION:  01/04/2008  DATE OF DISCHARGE:                              HISTORY & PHYSICAL   HISTORY OF PRESENT ILLNESS:  This is a 50 year old white female with a  diagnosis of neuropathy for 3 weeks prior to admission at [facility] on  December 22, 2007, with bilateral lower extremity weakness and inability  to walk and involuntary movements of right upper extremity.  The patient  with hypertension had normal LFTs, and upon workup, it was felt to have  Guillain-Barre syndrome.  LP was done which showed increased protein of  101, glucose 53, RBCs 53.  Hepatitis panel was positive hepatitis C  antibody and vitamin B12 650.  CT of the head and MRI were on  remarkable.  Abdominal ultrasound showed fatty liver.  The patient was  treated with 5 days of IVIG.  She continues to have bilateral lower  extremity weakness but more prominently pain and numbness in the lower  greater than upper extremities.  The patient is also supplemented for  vitamin D deficiency as well.  The patient continues to struggle with  mobility and self-care and thus was admitted to the inpatient rehab unit  today.   REVIEW OF SYSTEMS:  Notable for blurred vision and rash while taking  Neurontin previously.  She reports continence of bowel and bladder and  bowel movement in fact this morning.  Pain has been a prominent symptom  in the lower extremities.  She denies any shortness breath, chest pain,  etc.  Full review is in the written H&P.   PAST MEDICAL HISTORY:  Positive for neuropathy, back pain, bilateral  tubal ligation, insomnia, severe anxiety/panic disorder, chronic pain  syndrome, fibromyalgia, vitamin D deficiency, abdominal pain secondary  to  gastroparesis, diabetes type 2 borderline, severe degenerative disk  disease at L5-S1 with paracentral herniation.   FAMILY HISTORY:  Positive for CAD and hypertension.   SOCIAL HISTORY:  The patient is married.  She denies smoking or  drinking.   FUNCTIONAL HISTORY:  The patient was independent prior to presentation  to the hospital.  Currently, she is requiring moderate plus assist for  basic mobility and self-care.   ALLERGIES:  1. DARVOCET.  2. NEURONTIN.  3. TYLENOL.  4. CODEINE.   MEDICATIONS AT HOME:  1. Ambien 10 mg nightly.  2. Phenergan 25 mg p.r.n. nausea.  3. KCl 10 mEq daily.  4. Xanax 1 mg t.i.d.  5. Macrobid 100 mg b.i.d.  6. Levaquin 500 mg daily.  7. Hydrocodone 7.5 q.i.d.   LABORATORY:  Hemoglobin 10.6, white count 5.5, platelet counts 350,000.  Sodium 136, potassium 3.5, BUN 2, creatinine 0.34.  TSH 6.49.  Magnesium  1.9.  Vitamin D was less than 12.   PHYSICAL EXAMINATION:  VITAL SIGNS:  Blood pressure is 130/60, pulse is  84, respiratory rate 16.  She is afebrile.  GENERAL:  The patient is lying in bed and generally is anxious.  She is  in no acute distress.  HEENT:  Pupils equal, round, and reactive to light.  Ear, nose, and  throat exam is notable for borderline dentition.  Speech is clear in  general.  She states her tongue is numb.  NECK:  Supple without JVD or lymphadenopathy.  CHEST:  Clear to auscultation bilaterally without wheezes, rales, or  rhonchi.  HEART:  Regular rate and rhythm without murmur, rubs, or gallops.  ABDOMEN:  Soft and nontender.  Bowel sounds are positive.  SKIN:  Some flushing and perhaps some fine rash over the face;  otherwise, skin is generally intact with no signs of obvious breakdown.  NEUROLOGIC:  Cranial nerves II-XII are unremarkable.  Reflexes are 1+  throughout to trace.  She has decreased sensation, particularly below  the knees at 0/2 with notable dysesthesias.  The bilateral upper  extremities below the  wrist show 1/2 sensation with dysesthesias as  well.  Motor function, varied in the upper extremities from 2-3/5.  She  is weakest at the shoulder and hand intrinsics.  Lower extremity  strength is 1/5 proximally, 2+/5 distally at the feet.  The patient is  very ataxic, which generally is complicated by her overall weakness in  the extremities.  Judgment is generally intact, although she is anxious.  She is oriented x3.  She has fair to good memory.  She is tangential a  bit.  Mood is anxious.   ASSESSMENT/PLAN:  1. Functional deficits secondary to Guillain-Barre syndrome:  The      patient is admitted to the inpatient rehab unit today to receive      collaborative interdisciplinary care between the physiatrist, rehab      nursing staff, and therapy team.  The patient's level of medical      complexity and substantial therapy needs in context of the medical      necessity cannot be provided at a lesser intensity of care.      Physiatrist will provide 24-hour management, medical needs, as well      as oversight of the therapy plan and provide guidance as      appropriate regarding interaction of the 2.  24-hour rehab nursing      will assist in management of the patient's bowel and bladder      continence, skin care issues, pain management, appropriate      nutrition, and safety awareness.  They will also integrate therapy      concept techniques, education, etc.  PT will assess and treat her      for lower extremity strengthening, tolerance of use, and range of      motion.  Also work on transfer and basic mobility training.      Wheelchair goals will likely be in order with occasional mobility      goals depending on strengthening, strength improvement, and the      pain control.  OT will assess and treat for upper extremity use      with appropriate self-care and safety measures.  OT will also work      on [employer] and family education as appropriate.  Case       manager/social worker will assess for psychosocial issues and      discharge planning.  Team conferences will be held weekly to      establish goals, assess progress, and determine barriers at  discharge.  The patient will receive 3 hours of therapy per day at      least 5 days a week.   PROGNOSIS:  1. Fair to good.  Length of stay 2-3 weeks.  2. DVT prophylaxis, subcu Lovenox 40 mg daily.  3. Constipation:  Schedule Senokot daily with p.r.n. suppository and      sorbitol.  4. Anxiety:  We will begin low-dose Lexapro 5 mg nightly.  Continue      Xanax scheduled 1 mg t.i.d.  5. Fluid, electrolytes, and nutrition:  Continue current supplements.      We will check admission metabolic panel in the morning.  6. Pain management:  Change Lyrica 75 mg t.i.d. with scheduled Pamelor      10 mg nightly.  Schedule fentanyl 12 mcg q.72 h. with breakthrough      hydrocodone 10/325 for pain.  7. Phenergan p.r.n. for nausea.      Ranelle Oyster, M.D.  Electronically Signed     ZTS/MEDQ  D:  01/04/2008  T:  01/05/2008  Job:  [id number]

## 2010-08-27 NOTE — Discharge Summary (Signed)
NAMECHRISTENE, [name]                 ACCOUNT NO.:  [id number]   MEDICAL RECORD NO.:  [id number]          PATIENT TYPE:  INP   LOCATION:  A304                          FACILITY:  [facility]   PHYSICIAN:  Osvaldo Shipper, MD     DATE OF BIRTH:  08/20/1960   DATE OF ADMISSION:  12/22/2007  DATE OF DISCHARGE:  09/16/2009LH                               DISCHARGE SUMMARY   The patient does not have a primary medical doctor.   DISCHARGE DIAGNOSES:  1. 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 Severe weakness and deconditioning.  3. Severe anxiety disorder.  4. Chronic pain syndrome.  5. Status post treatment for urinary tract infection.   Please review H and P dictated at the time of admission for details  regarding the patient's presenting illness.   BRIEF HOSPITAL COURSE:  1. Weakness.  This is a 50 year old Caucasian female who presented to      the ED with complaints of generalized weakness.  She dated her      symptoms to about 3-4 weeks ago.  She mentioned that she was      admitted for the same at [facility] in [place].  No workup was      really done at that hospital according to the patient.  Anyway, the      patient was admitted here.  She underwent MRI of the brain which      did not show any acute abnormalities, moderate age advanced atrophy      was noted.  Kofi A. Doonquah, M.D. saw the patient and he ordered a      spinal tap on her which showed normal WBC with increased total      protein.  This suggested AIDP and the patient was started on IV      immunoglobulins.  A 5-day course was recommended and today is the      fifth day.  The patient is to get this dose later today.  The      patient's weakness in the lower extremities and her upper      extremities have improved to some extent, but she still requires      maximal assistance with even minimal transfers.  She has been      followed by physical therapy on a daily basis.  They  feel that she      will need rehabilitation in an inpatient setting.  FVCs and NIFs      have been done by the respiratory therapist and they are      satisfactory at this time.  She does not have any respiratory      distress.  2. She was being treated for UTI with Levaquin which the course of      which was completed.  Urine cultures were not sent out during this      admission.  3. Severe anxiety disorder.  She was continued on Xanax which seemed      to help her anxiety.  4. Chronic pain syndrome.  She was on  Lortab.  Guillain-Barre can also      cause some degree of pain, so she was continued on this on this      medication.  5. We noted abnormal LFTs when she presented to the hospital.  Her AST      was 60.  ALT was 39.  Alk-phos was 140.  Hepatitis profile was sent      off which showed that the hepatitis C antibody was reactive.  HCV      RNA quantitative was sent which was undetectable and so I am not      sure how to interpret this data at this time.  I will discuss with      our gastroenterology folks. LFT's have since normalized.  6. She also had low vitamin D levels for which she was started on      Vitamin D.  TSH was 7.12, free T4 and T3 uptake were normal.  1. Her iron profile studies were unremarkable.  B12 level was normal.      Her RPR was normal as well.  She was mildly anemic with no evidence      for overt blood loss.  She was also mildly hypokalemic.  This will      be replaced as well.  2. Cholesterol was also checked and the LDL was 110.  Total      cholesterol was 182.  No treatment at this time.   So overall at this time the patient continues to be stable.  I am hoping  a skilled nursing facility will be arranged by the end of this week.  There are some financial and insurance issues that need to be sorted  out.   DISCHARGE MEDICATIONS:  1. Lortab 7.5/500 one tablet every 4 hours as needed for pain.  2. Potassium chloride 20 mEq daily.  3. Xanax 1 mg  t.i.d.  4. Phenergan 25 mg every 6 hours as needed for nausea.  5. Ambien 10 mg p.o. nightly p.r.n. for insomnia.  6. Vitamin D 1000 units p.o. once daily.  7. Colace 100 mg p.o. b.i.d.  8. Ensure 1 can t.i.d. with meals.   DIET:  She can have a regular diet.  However, the patient will require  assistance with feeding.   PHYSICAL ACTIVITY:  She will need intensive physical therapy before we  can anticipate her ambulating.  At this time she is not able to  ambulate.   FOLLOW UP:  She does not have a primary medical doctor.  She will need  this to be set up.   ADDENDUM:  Positive screening test for Hep C is thought to be a false positive  considering undetectable PCR. This was discussed with Dr. Jena Gauss.      Osvaldo Shipper, MD  Electronically Signed     GK/MEDQ  D:  12/28/2007  T:  12/28/2007  Job:  [id number]   cc:   Darleen Crocker A. Gerilyn Pilgrim, M.D.  Fax: [phone]

## 2010-08-27 NOTE — H&P (Signed)
[name], Dalton                 ACCOUNT NO.:  [id number]   MEDICAL RECORD NO.:  [id number]          PATIENT TYPE:  OBV   LOCATION:  A304                          FACILITY:  [facility]   PHYSICIAN:  Margaretmary Dys, M.D.DATE OF BIRTH:  1960-04-30   DATE OF ADMISSION:  12/22/2007  DATE OF DISCHARGE:  LH                              HISTORY & PHYSICAL   PRIMARY CARE PHYSICIAN:  The patient is unassigned.   ADMISSION DIAGNOSES:  1. Generalized weakness.  2. Chronic pain syndrome.  3. Recent diagnosis of neuropathy of unknown origin.  4. History of recent urinary tract infection.  5. Severe anxiety with almost panic disorder.   CHIEF COMPLAINT:  Generalized weakness.   HISTORY OF PRESENT ILLNESS:  [name] is a 50 year old female who is a  poor historian.  She reports that over the past 3 weeks, she has felt  increasingly weak and tired.  The patient actually went to [facility]  where she had several tests done according to her including a CT scan  and MRI of her back.  She also had an EMG by a neurologist.  She does  not remember the name of the neurologist, but she was told that she had  neuropathy of unknown origin.  The patient does not remember having an  MRI of her brain.  The patient was discharged to home on  pain  medications.  The patient is continuously asking me for pain medications  here in the emergency room saying she hurts all over from her head down  to her toes.  She denies any nausea or vomiting.  She has had no fevers  or chills.  The patient states her vision occasionally gets blurry, but  she denies seeing any spots, diplopia or double vision.  She denies any  double vision.  She denies any headaches out of the ordinary.   The patient says she was recently diagnosed with an urinary tract  infection and has been on antibiotics for that.  The patient has no  family history of MS and has never been told that she had MS.  The  patient decided to come into the  hospital tonight because she was unable  to get around, her husband was unable to help her with her daily chores.  Evaluation in the emergency room really was unremarkable as discussed  below.   REVIEW OF SYSTEMS:  A 10-point review of systems otherwise negative  except as mentioned in the history of present illness.  No history of  weight loss.   PAST MEDICAL HISTORY:  1. Generalized neuropathy, recently diagnosed of unknown cause.  2. History of back surgery.  3. Status post cholecystectomy.  4. Status post bilateral tubal ligation.  5. History of fractured both legs.   MEDICATIONS:  1. She is on Ambien 10 mg p.o. at bedtime.  2. Potassium chloride 10 mg p.o. once a day.  3. Phenergan 25 mg as needed.  4. Levaquin 500 mg p.o. once a day.  5. Hydrocodone/acetaminophen 7.5/500 one tablet up to 4 times a day.  6. Alprazolam oral  1 mg p.o. t.i.d.  7. Macrobid 100 mg p.o. b.i.d.   ALLERGIES:  NO KNOWN DRUG ALLERGIES.   FAMILY HISTORY:  Positive for hypertension and coronary artery disease.  The patient denies any history of MS.  No history of other neurological  disorders.   SOCIAL HISTORY:  The patient is married, lives with her husband.  She  has 2 grandchildren.   PHYSICAL EXAMINATION:  GENERAL:  The patient was conscious, alert,  comfortable, not in acute distress, well oriented in time, place and  person.  VITAL SIGNS:  Her blood pressure was 130/70 with pulse of 68,  respiratory rate was 16, temperature 98.5 degrees Fahrenheit, oxygen  saturation was 98% on room air.  HEENT:  Normocephalic, atraumatic.  Oral mucosa was moist with no  exudates.  NECK:  Supple.  No JVD or lymphadenopathy.  LUNGS:  Clear clinically with good air entry bilaterally.  HEART:  S1-S2 regular.  No S3, S4, gallops or rubs.  ABDOMEN:  Soft, nontender.  Bowel sounds positive.  No masses palpable.  EXTREMITIES:  No pitting edema.  No calf induration or tenderness was  noted.  CNS:  The patient  was awake, alert and following commands.  The patient  did not have any focal neurological weakness.  She did seem to have some  wasted hand muscles though.   LABORATORY:  Sodium is 134, potassium 3.4, chloride 101, CO2 27, glucose  86, BUN of 6, creatinine 0.44, albumin 3.1, ALT 60, ALT 39, total  bilirubin 0.5.  Urinalysis was negative.  Urine microscopy was negative.  White blood cell count was 6.5, hemoglobin of 12.2, hematocrit 35.9,  platelet count was 298.   DIAGNOSTICS:  Head CT scan showed no acute intracranial abnormality.  There were some chronic small vessel white matter ischemic changes.   ASSESSMENT:  [name] is a 50 year old female who presents to the  emergency room with the above mentioned diagnosis.   PLAN:  1. I will admit the patient on observation.  2. We will control pain with IV Dilaudid and continue her home      medications.  3. We will review her with the health care services tomorrow, physical      therapy and social services.  The patient may need rehabilitation      especially if she does not have significant medical diagnosis.  4. I have requested Dr. Gerilyn Pilgrim, neurologist, to help evaluate her      further to rule out possibility of multiple sclerosis.  5. We will resume all the patient's previous home medications.      Margaretmary Dys, M.D.  Electronically Signed     AM/MEDQ  D:  12/23/2007  T:  12/23/2007  Job:  [id number]

## 2010-08-27 NOTE — Group Therapy Note (Signed)
[name], [name]                 ACCOUNT NO.:  [id number]   MEDICAL RECORD NO.:  [id number]          PATIENT TYPE:  INP   LOCATION:  A304                          FACILITY:  [facility]   PHYSICIAN:  Dorris Singh, DO    DATE OF BIRTH:  15-Feb-1961   DATE OF PROCEDURE:  DATE OF DISCHARGE:                                 PROGRESS NOTE   The patient seen today, resting comfortably in bed.  States that she  still feels extremely exhausted.  Spoke to Dr. Gerilyn Pilgrim today regarding  her plan of care.  He states the best place for her would be a skilled  nursing facility.  Apparently social services is working on her  regarding that.   PHYSICAL EXAMINATION:  Her vitals for today, do not have a blood  pressure recorded.  Her vitals from yesterday were pretty stable.  Will  check those.  HEAD:  Normocephalic, atraumatic.  HEART:  Regular rate and rhythm.  LUNGS:  Clear to auscultation bilaterally.  ABDOMEN:  Soft, nontender, nondistended.  EXTREMITIES:  No edema, ecchymosis, or cyanosis.  NEUROLOGIC:  She still has a 3/5 to 4/5 strength bilaterally in lower  extremities.   LABORATORY DATA:  Her labs for this morning are as follows, her sodium  is 137, potassium 3.8, chloride 108, CO2 26, glucose 86.  BUN 2 and  creatinine 0.34.   ASSESSMENT AND PLAN:  1. Guillain-Barre syndrome.  She is finishing her last immunoglobulin      treatment today or yesterday and she is also to undergo physical      therapy.  We recommend that she goes to a skilled nursing facility.  2. Urinary tract infection, which has resolved.  3. Hepatitis C.  She will need to be followed up by gastrointestinal      in the future to determine her hepatitis C status.   At this point in time we are waiting for her to get placed.  She has a  discharge summary that is dictated and we are just awaiting placement  for the patient right now.      Dorris Singh, DO  Electronically Signed     CB/MEDQ  D:  12/29/2007  T:   12/29/2007  Job:  [id number]

## 2010-08-27 NOTE — Assessment & Plan Note (Signed)
Ms. Demont returns to clinic today for followup evaluation.  She reports  that she is doing fairly well overall.  She has made excellent progress  with home health therapy and is walking actually on her own without a  device short distances.  She does have a wide base of support during  ambulation.  She does report that the fentanyl patch at 50 mcg per hour  strength is not giving her adequate relief.  She complains of pain  diffusely below neck level in her body.  She reports that she is using  the double upright ankle-foot orthosis for lower extremity support.  She  also uses the Norco approximately 3-5 times per day for breakthrough  pain.   MEDICATIONS:  1. Robaxin 750 mg q.i.d. p.r.n. (2-3 per day).  2. Synthroid 0.05 mg daily.  3. Ambien 10 mg daily.  4. Lexapro 10 mg daily.  5. Magnesium oxide 400 mg b.i.d.  6. Xanax 1 mg t.i.d. p.r.n.  7. Pamelor 25 mg daily.  8. Lyrica 50 mg b.i.d.  9. Stool softeners p.r.n.  10.Norco 10/325 one tablet 5 times per day.  11.Fentanyl patch 50 mcg per hour, change q.72 h.  12.Flexeril 10 mg t.i.d. p.r.n.  13.Vitamin D 50,000 international units weekly.   REVIEW OF SYSTEMS:  Positive for night sweats.   The patient reports that her pain is interfering with her traveling,  social life, sleeping, standing, walking, lifting, and all homemaking  and personal care.   PHYSICAL EXAMINATION:  GENERAL:  A well-appearing, middle-aged adult  female in mild-to-moderate acute discomfort.  VITAL SIGNS:  Blood pressure 112/58 with pulse of 85, respirations 18,  and O2 saturation 96% on room air.  EXTREMITIES:  She has 4+/5 strength throughout the bilateral upper  extremities and 4/5 strength in the bilateral lower extremities.  She  ambulates poorly in the office without any assistive device with no  contact assistance.   DIAGNOSES:  1. Status post Guillain-Barre syndrome with history of polyneuropathy,      slowly improving.  2. Anxiety disorder.  3. Chronic pain.  4. Hypothyroidism.  5. Positive hepatitis C antibody.   In the office today, we did increase the patient's fentanyl patch up to  a 75 mcg per hour strength, change q.72 h.  We also refilled her  hydrocodone as of today April 27, 2008.  She will continue in-home  health therapies and we will plan on seeing her in followup either  myself or the nurse in 1 month's time.          ______________________________  Ellwood Dense, M.D.    DC/MedQ  D:  04/27/2008 11:39:58  T:  04/28/2008 02:15:21  Job #:  [id number]

## 2010-08-30 NOTE — Discharge Summary (Signed)
[name], [name]                 ACCOUNT NO.:  [id number]   MEDICAL RECORD NO.:  [id number]          PATIENT TYPE:  IPS   LOCATION:  4037                         FACILITY:  [facility]   PHYSICIAN:  Ellwood Dense, M.D.   DATE OF BIRTH:  1960/08/06   DATE OF ADMISSION:  01/04/2008  DATE OF DISCHARGE:  02/01/2008                               DISCHARGE SUMMARY   DISCHARGE DIAGNOSES:  1. Guillain-Barre syndrome with polyneuropathy.  2. Anxiety disorder.  3. Hypothyroidism.  4. History of hypokalemia, resolved.  5. Vitamin D deficiency.  6. Hypomagnesemia.  7. Positive hepatitis C antibody.   HISTORY OF PRESENT ILLNESS:  [name] is a 50 year old female  with history of neuropathy diagnosed 3 weeks prior to admission with  progressive bilateral lower extremity weakness with inability to walk  and involuntary movements of right upper extremity with weakness.  She  was admitted to [facility] on December 22, 2007, for workup.  The  patient was noted to be hypotensive with abnormal LFTs and UTI at  admission.  Hepatitis panel done was positive for hep C antibody.  HCV  RNA was not detected.  Vitamin B12 levels were at 650.  Neurology  evaluated the patient and felt patient with GBS.  LP done showed  increased protein at 101, glucose 53, and rbc's 53.  CT of head and MRI  of brain done showed no acute changes.  Abdominal ultrasound showed  fatty liver.  Vitamin D levels checked, was noted to be at 12.  TSH was  noted to be at 7.128.  Thyroid panel of December 24, 2007, showed TSH  of 6.49 with T3U at 29.2 and free T4 at 0.95.  The patient was noted to  have hypokalemia at admission with potassium at 3.4 and magnesium level  at 1.9.  The patient was treated with a 5-day course of IVIG per  Neurology input.  Currently, she continues with bilateral lower  extremity weakness, more prominently pain and numbness, lower extremity  greater than bilateral upper extremities.  She was started  on vitamin D  supplement.  Currently, the patient continues to struggle with mobility  and self-care and was thus admitted to [facility] for progressive therapies.   PAST MEDICAL HISTORY:  Significant for:  1. Lumbar decompression in the past, with continued problems with DDD      and chronic pain for past 7 years, neuropathy diagnosed a few weeks      ago.  2. Bilateral tubal ligation.  3. Insomnia.  4. Chronic anxiety.  5. Question of panic disorder.  6. Recent diagnosis of fibromyalgia.  7. Vitamin D deficiency.  8. Question of DM type 2.   FAMILY HISTORY:  Positive for coronary artery disease and hypertension.   SOCIAL HISTORY:  The patient lives with a long-time partner of 27 years,  was independent until 3-4 weeks prior to admission.  Does not use any  tobacco or alcohol.   FUNCTIONAL HISTORY:  The patient was independent until 4 weeks ago, at  which time she has required total assist for ADLs and has  essentially  been bed bound.   FUNCTIONAL STATUS:  Currently, the patient is mod plus assist for basic  mobility and self-care.   PHYSICAL EXAMINATION:  GENERAL:  The patient is a well-nourished, well-  developed female, noted to be generally anxious, in no acute distress.  HEENT:  Pupils are equal, round, and reactive to light.  Ear, nose, and  throat exam shows borderline dentition.  Oral mucosa moist.  NECK:  Supple without JVD or adenopathy.  CHEST:  Clear to auscultation bilaterally without wheezes, rales, or  rhonchi.  HEART:  Regular rate and rhythm without murmurs or gallops.  ABDOMEN:  Soft and nontender with positive bowel sounds.  SKIN:  Some flushing and question of fine rash over face, otherwise skin  is intact without breakdown.  NEUROLOGIC:  Cranial nerves II through XII unremarkable.  Speech is  clear, however halting.  The patient's reflexes, numbness in oral motor  area with numbness of tongue.  The patient has decreased sensation,  particularly below the knee  is 0/5 with notable dysesthesias.  Bilateral  upper extremities below wrists show 1 to 2 sensation with dysesthesias  as well.  Motor function varies in upper extremities to -2 to 3/5.  She  is weakest at shoulder and hand intrinsics.  Lower extremity strength is  1/5 proximally and 2+/5 distally at feet.  The patient is very ataxic.  Judgment is generally intact, although the patient is extremely anxious.  She is oriented x3, has fair to good memory.  Speech tends to be  tangential.   HOSPITAL COURSE:  [name] was admitted to Rehab on January 02, 2008, for inpatient therapies to consist of PT, OT, and Speech at least  3 hours 5 days a week.  Physiatry, Rehab RN, and Therapy team have  worked together to provide customized collaborative interdisciplinary  care to help patient progress towards goals.  Weekly team conferences  were held to assess the patient's progress as well as discuss barriers  to discharge.  Rehab RN has been assisting with skin-care monitoring for  prevention of breakdown as well as bowel and bladder training to help  set bowel program for prevention and treatment of constipation.  They  have also been working closely with therapies to premedicate the patient  with pain meds prior to therapies to help improve her participation in  therapy.  Pain management was initiated with schedule of fentanyl patch  12 mcg q.72 h., Lyrica was increased to 75 mg p.o. t.i.d., and Pamelor  10 mg p.o. nightly was additionally scheduled to help with dysesthesias.  Secondary to her anxiety levels, Xanax was continued on t.i.d. basis,  and Lexapro was added additionally for mood stabilization.  Subcu  Lovenox was used for DVT prophylaxis during this stay.  Labs were done  on January 04, 2008, revealing hemoglobin 10.8, hematocrit 32.0, white  count 7.1, and platelets 346.  Check of lytes revealed sodium 135,  potassium 3.7, chloride 105, CO2 of 24, BUN 5, creatinine 0.35,  and  glucose 95.  LFTs showed improvement with AST at 33, ALT 17, T bili 0.7,  and albumin 2.7.  The patient's voiding was monitored with PVR checks,  and the patient was noted be voiding without signs of retention.  Initially, the patient has had issues with excessive amount of ataxia  throughout the trunk and extremities as well as extensor tone limiting  her mobility.  MRI of brain and C-spine was done for full workup; this  was done without contrast.  This showed premature atrophy with stable  small-vessel disease, no acute intracranial abnormality.  C-spine films  showed no structural abnormality or intrinsic cord lesion.  The patient  was started on some muscle relaxers to help with her spasticity.  Recheck of TSH on January 06, 2008, showed TSH at 11.4; therefore, the  patient was started on thyroid supplement on January 05, 2008.  TSH  levels were rechecked on January 18, 2008, showing improvement with TSH  at 4.216.  The patient is maintained on 25 mcg p.o. of Synthroid for  now.  She is to follow up with LMD for further routine checks of TSH in  the next 4 weeks, with adjustments of Synthroid as needed.  A.m.  cortisol level was also checked on January 06, 2008, showing cortisol  levels at 14.2.  Magnesium levels checked have been low normal, with 1.6  at admission.  The patient has been maintained on Mag-Ox b.i.d. on  b.i.d. basis throughout her stay.  Repeat magnesium level on January 25, 2008, shows magnesium level is stable at low normal at 1.6.  Routine  check of lytes have showed no evidence of hypokalemia, with most recent  lytes of January 31, 2008, revealing sodium 138, potassium 3.7, chloride  101, CO2 of 26, BUN 14, creatinine 0.54, and glucose 80.  CBC with diff  of January 31, 2008, shows hemoglobin 11.2, hematocrit 33.6, white count  6.2, and platelets 266.  Initially, the patient was maintained on  vitamin D supplement at 3000 units per day.  This was changed  over to  50,000 units every week on January 25, 2008, with the patient advised to  have followup vitamin D levels checked in LMD's office in the future.  Hemoglobin A1c was checked due to questionable history of borderline  diabetes.  Hemoglobin A1c was noted to be normal at 4.7.  A UA-UC was  done past admission.  Urine culture showed 100,000 colonies, multi  species.  A Neuro consult was ordered for input regarding the patient's  diagnosis and for question regarding further workup.  Dr. Vickey Huger, who  evaluated the patient, felt that the patient would require EMG nerve  conduction study of upper and lower extremities, which could help with  diagnosis of polyneuropathy if present.  We have been unable to do this  during this hospitalization.  The patient to have EMG nerve conduction  studies done on outpatient basis either at Neurology office or at [facility]  Additionally, repeat MRI of brain and C-spine were  done on January 11, 2008, with dye, and this showed no new findings.   The patient's pain control has been reasonable with use of hydrocodone  10-325 q.4-6 h. p.r.n.  The patient reported having no effect with  addition of Duragesic patch, therefore this was discontinued.  The  patient currently continues to use hydrocodone approximately q.4 h.  while awake.  Her blood pressures have been monitored on b.i.d. basis,  and these are noted to be on low normal side with systolics from 90s to  100s and diastolic from high 50s to low 81X.  The patient has had no  complaints of presyncopal symptoms.  Heart rate has been stable in the  70s range.  Rehab RN has been working with bowel and bladder training  with the patient being toileted q.4 h. to help maintain continence.  Senokot-S additionally was added nightly to help with constipation  issues  with the patient's frequent use of narcotics.  She did require  Senokot-S to be increased to b.i.d. basis to help normalize  her bowel  pattern.  Speech Therapy evaluation was ordered secondary to the  patient's complaints of dysarthria with difficulty chewing and decreased  oromotor function sensation.  The patient appeared to have oral  dysphagia secondary to decreased oromotor strength as well as decreased  range of motion and decrease in oral facial sensation.  Speech Therapy  has been working with the patient with oromotor exercises to increase  strength as well as range of motion for lingual and labial-mandibular  movement, and the patient is currently independent for performing these  exercises.  The patient has also improved oral motor control with  mastication strategies to help with p.o.'s at meals.  Currently, she is  able to complete meals without complaints of oral control.  The patient  did have issues with increasing blurry vision.  Lyrica was placed on  hold.  The patient has had continued complaints regarding neuropathy,  bilateral lower extremities greater than bilateral upper extremities.  She is unable to tolerate Neurontin due to side effects in the past.  Lyrica was reinitiated at a low dose of 25 mg b.i.d., which have  plus/minus improved her symptoms.  The patient feels that Pamelor has  been of best benefit for her symptoms currently.  The patient was also  noted to have increase in extensor tone with attempts at increasing  therapy.  She was started on Robaxin, as well as low-dose baclofen was  added to help assist with symptomatology.  Currently, baclofen has been  weaned off with spasticity much controlled on Robaxin 750 mg q.i.d.  Sleep hygiene has improved with Ambien nightly and anxiety level overall  greatly improved with Lexapro being increased to 10 mg nightly.  Dr.  Leonides Cave, Neuropsych, has also been following along for coping as well  as support during the patient's stay.  In interactions with her, the  patient has expressed positive outlook about her current situation.   At  the time of discharge, the patient has improved in her overall  mobility with decrease in apraxic movement.  Right upper extremity  control has increased with increase in finger motor control.  Currently  strength, right upper extremity, is 3/5.  Left upper extremity strength  is at 3/5 with active range of motion within functional limits with  decrease in apraxic movement.  Bilateral lower extremity strength is  currently at -4/5.  The patient does continue to have instability with  tendency for bilateral lower extremities to buckle without warning when  fatigued.  The patient was noted to have excessive inversion at  bilateral ankles; therefore, bilateral upright AFOs were ordered to help  with the patient's mobility and stability with transfers and ambulation.  At the time of admission, the patient was total assist +2 with motor  control deficits that were exacerbated by anxiety.  Currently, the  patient is still ataxic, but is gaining control over her movements  despite sensory deficits.  Currently, the patient is able to increase  weightbearing and control inversion of her ankles with bilateral AFO.  She is able to self-propel with bilateral lower extremities and  occasional use of upper extremities to propel her wheelchair.  At the  time of discharge, the patient is at min assist for transfers, with  close supervision downhill from bed to wheelchair.  She is able to  perform standing balance with a rolling  walker with head-turns with  letting go of one upper extremity.  She has been able to stand and even  walk for 3 minutes with min to mod assist for dynamic and static  standing balance.  She has been able to take a few steps, but gait is  not tested secondary to the patient's tendency to fall without warning,  requiring total assist +2 to catch to prevent falls.  OT has been  working with the patient for ADL training at sink and shower levels.  They have been focusing on activities  to help control upper extremity  movement with washing as well as attention to positioning of bilateral  lower extremities, especially her feet, with frequent visual checks.  Dressing was initially at edge of bed, with focus on recall for use of  adaptive techniques.  [employer] was used to address setup and preparation of meal trays with use of built-up utensils to help with  control for self-feeding.  For toilet transfers, the patient was min  assist to right and min to mod assist to left with the patient's  inability to maintain forward flexion during transfers.  Initially, the  patient was noted to have increase in tone in bilateral upper and lower  extremities with elevated anxiety levels.  She has gained control over  her apraxia and is currently able to maintain sitting balance with  distant supervision unsupported with bilateral upright AFOs to support  bilateral lower extremity ankles.  She can bathe at sink with min to mod  assist to stand, with caregivers to help with lower-body care.  She can  perform 2 out of 3 toileting tasks and can consistently transfer to drop-  arm commode with min assist.  Family education has been done with the  patient's boyfriend as well as father, with focus on having the patient  cue her caregivers to help her with self-care.  PT has also worked with  the patient's family for training for slide-board transfers as well as  emphasizing safety concerns with the patient's mobility for fall  prevention.  The patient is advised to continue wearing resting splints  at nighttime to help decrease tightness and spasticity of upper  extremities in morning.  She is also advised about wearing bilateral  upright AFOs when out of bed to help with control of bilateral feet and  for safety with transfers.  Home evaluation visit was done with therapy.  Family was advised to put up a ramp, which was done.  The patient is  currently able to enter house through  backdoor up a ramp.  Ramp was  noted to be too steep for the patient to propel self up the ramp, but  caregiver can help propel a wheelchair.  Boyfriend was able to bump the  patient over door sill.  The patient was able to navigate her house in  the wheelchair, with increased effort over carpeted areas noted.  Recommendations were to drive car as close to the ramp as possible, to  remove throw rugs, and use drop-arm bedside commode for toileting as the  patient is unable to propel her wheelchair into the bathroom.  They were  also advised to rearrange furniture in the bedroom to accommodate  hospital bed as well as rearranging furniture in living room to allow  access to couch for the patient to perform transfers.  The patient's  boyfriend can safely provide the assistance currently needed.  The  patient will continue to receive further  followup home health, PT and  OT, and RN by [facility] past discharge.  On February 01, 2008,  the patient is discharged to home.   DISCHARGE MEDICATIONS:  1. Robaxin 750 mg q.i.d.  2. Synthroid 50 mcg a day.  3. Ambien 10 mg nightly.  4. Lexapro 10 mg nightly, samples for 1-1/2 months provided.  5. Mag-Ox 400 mg b.i.d.  6. Xanax 1 mg 1 p.o. q.8 h.  7. Pamelor 20 mg nightly.  8. Lyrica 50 mg 1 p.o. b.i.d., samples for 2 months provided.  9. Senokot-S 2 p.o. b.i.d.  10.Norco 10-325 one p.o. q.4-6 h. p.r.n. pain, maximum 4-5 pills a      day, 140 pills prescribed.  11.Vitamin D 50,000 units 1 pill weekly on Tuesdays.   DIET:  Regular.   ACTIVITY LEVEL:  24-hour supervision.  No alcohol, no smoking, and no  driving.  Transfer only with assistance.   SPECIAL INSTRUCTIONS:  Wear splints on bilateral hands at bedtime, wear  support stockings and braces on bilateral lower extremities when out of  bed.  [facility] to provide PT and OT.   FOLLOWUP:  The patient to follow up with Dr. Thomasena Edis on March 01, 2008, at 09:20 for 09:40  appointment.  Follow up with Dr. Loney Hering on  February 21, 2008, at 11:30 a.m.  Follow up with Dr. Vickey Huger on March 03, 2008, at 10:30 a.m.      Greg Cutter, P.A.    ______________________________  Ellwood Dense, M.D.    PP/MEDQ  D:  02/02/2008  T:  02/02/2008  Job:  [id number]   cc:   Dierdre Highman. Loney Hering, MD  Melvyn Novas, M.D.

## 2010-08-30 NOTE — Op Note (Signed)
[name], [name]                 ACCOUNT NO.:  [id number]   MEDICAL RECORD NO.:  [id number]          PATIENT TYPE:  AMB   LOCATION:  SDC                           FACILITY:  [facility]   PHYSICIAN:  Michelle L. Grewal, M.D.DATE OF BIRTH:  03/20/1961   DATE OF PROCEDURE:  01/31/2004  DATE OF DISCHARGE:                                 OPERATIVE REPORT   PREOPERATIVE DIAGNOSIS:  Menorrhagia.   POSTOPERATIVE DIAGNOSIS:  Menorrhagia.   PROCEDURE:  1.  Dilatation and curettage.  2.  Hysteroscopy.  3.  THERMACHOICE endometrial ablation.   SURGEON:  Michelle L. Vincente Poli, M.D.   ANESTHESIA:  MAC with paracervical block.   PROCEDURE:  The patient was taken to the operating room after informed  consent was obtained.  She was prepped and draped in the usual sterile  fashion and her bladder was emptied with in-and-out catheter.  A speculum  was inserted into the vagina and the cervix was grasped with a tenaculum and  a paracervical block was performed in standard fashion.  The uterus was then  gently dilated using Pratt dilators.  A diagnostic hysteroscope was inserted  into the uterus and the entire endometrial cavity was inspected and noted to  be normal, but with a thickened endometrium.  The hysteroscope was removed  and a thorough sharp curettage was performed of all 4 walls of the uterus,  and all tissue was sent to pathology for analysis.  The THERMACHOICE III  balloon was inserted and endometrial ablation was performed in a standard  fashion according to the manufacturer's specification.  There was a good 8-  minute burn.  It was a pressure approximately in the 160s.  There was no  loss of pressure during the procedure.  The balloon was removed and noted to  be intact.  There was no bleeding noted.  All sponge, lap and instrument  counts were correct x2.  The patient went to recovery room in stable  condition.     [place]   MLG/MEDQ  D:  01/31/2004  T:  01/31/2004  Job:  [id number]

## 2010-08-30 NOTE — Assessment & Plan Note (Signed)
[name] is a 50 year old female previously diagnosed with Guillain-  Barre syndrome, but repeat EMGs showed sensory neuropathy.  Differential  included Sjogren syndrome versus a paraneoplastic syndrome.  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  She is being treated  for at [facility] although I do not have the treating physicians notes.  She has gone to Dr. Kellie Simmering for a rheumatology evaluation which revealed  no signs of Sjogren syndrome.   The patient has had no other new medical problems in the interval time.  Her pain is relatively well controlled on the current pain medications  which include oxycodone 15 mg q.i.d. and Lyrica 100 mg t.i.d.   She has continued with some physical therapy, has had some good progress  over the last few months to the point where she is ambulating short  distances without a device.   She walked 2-3 minutes at time, she does not climb steps or drives.  She  is wheelchaired for a long period.  She needs assistance with dressing,  bathing, toileting, meal prep, household duties and shopping but not as  much as before.   REVIEW OF SYSTEMS:  Positive for bladder control problems, numbness,  tingling, trouble walking, spasms, dizziness, depression, anxiety,  nausea, constipation, weight gain.   SOCIAL:  She is single, lives with her fiance.  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   EXAMINATION:  VITAL SIGNS:  Blood pressure 127/89, pulse 90,  respirations 60, O2 sat 97% room air.  GENERAL:  Overweight female in no acute distress.  Mood and affect is  brighter.  GAIT:  She uses a wheelchair but also gets and ambulates with a widened  base support, short step length, looks mildly unstable but only goes  short distances with standby assist.  MUSCULOSKELETAL:  Lower extremity strength is 4/5 strength in hip  flexion, knee extension, ankle dorsiflexors.  She is out of her AFOs  now.  Her upper extremities have  good strength.   IMPRESSION:  Sensory neuropathy improving given that she has no signs of  Sjogren's.  We will check anti-hue antibody.  Complete workup.   The patient does note some concerns financially given that her Medicaid  will be stopped at the end of this month because of her getting  disability payments.  She requests some medication that does not require  as much monitoring.  Therefore, we will switch her back to hydrocodone  10/325,  give her 180 tablets for 1 month supply.  She will call a  pharmacy for refills and they fax [place] a copy.  I will see her back in 3  months or sooner if we need to.      Erick Colace, M.D.  Electronically Signed     AEK/MedQ  D:  12/25/2008 16:32:49  T:  12/26/2008 05:42:52  Job #:  [id number]

## 2011-01-13 LAB — DIFFERENTIAL
Basophils Relative: 1
Eosinophils Absolute: 0.4
Eosinophils Absolute: 0.4
Lymphocytes Relative: 33
Lymphocytes Relative: 27
Lymphs Abs: 2.3
Lymphs Abs: 1.5
Neutro Abs: 2.8
Neutrophils Relative %: 50

## 2011-01-13 LAB — BASIC METABOLIC PANEL
BUN: 2 — ABNORMAL LOW
BUN: 2 — ABNORMAL LOW
CO2: 26
Calcium: 8.7
Chloride: 104
Chloride: 108
Chloride: 108
Creatinine, Ser: 0.34 — ABNORMAL LOW
Creatinine, Ser: 0.35 — ABNORMAL LOW
GFR calc Af Amer: >60
Glucose, Bld: 86
Glucose, Bld: 87
Potassium: 3.5
Sodium: 136

## 2011-01-13 LAB — CBC
HCT: 30.8 — ABNORMAL LOW
Hemoglobin: 10.8 — ABNORMAL LOW
Hemoglobin: 10.6 — ABNORMAL LOW
MCHC: 33.7
MCHC: 34.4
MCV: 109.4 — ABNORMAL HIGH
MCV: 110.9 — ABNORMAL HIGH
RBC: 2.92 — ABNORMAL LOW
RBC: 2.78 — ABNORMAL LOW

## 2011-01-13 LAB — COMPREHENSIVE METABOLIC PANEL
BUN: 5 — ABNORMAL LOW
CO2: 24
Calcium: 9.1
Creatinine, Ser: 0.35 — ABNORMAL LOW
GFR calc non Af Amer: >60
Glucose, Bld: 95

## 2011-01-13 LAB — URINALYSIS, ROUTINE W REFLEX MICROSCOPIC
Ketones, ur: NEGATIVE
Specific Gravity, Urine: 1.012

## 2011-01-13 LAB — TSH: TSH: 11.386 — ABNORMAL HIGH

## 2011-01-13 LAB — HEMOGLOBIN A1C
Hgb A1c MFr Bld: 4.7
Mean Plasma Glucose: 88

## 2011-01-13 LAB — URINE MICROSCOPIC-ADD ON

## 2011-01-13 LAB — CORTISOL-AM, BLOOD: Cortisol - AM: 14.2

## 2011-01-13 LAB — MAGNESIUM
Magnesium: 1.5
Magnesium: 1.5

## 2011-01-13 LAB — URINE CULTURE: Colony Count: >=100000

## 2011-01-14 LAB — CBC
HCT: 30 — ABNORMAL LOW
HCT: 33.6 — ABNORMAL LOW
MCHC: 33.5
MCV: 103.4 — ABNORMAL HIGH
MCV: 103.9 — ABNORMAL HIGH
Platelets: 229
Platelets: 266
RBC: 2.89 — ABNORMAL LOW
RDW: 13.6
WBC: 6.5

## 2011-01-14 LAB — BASIC METABOLIC PANEL
BUN: 3 — ABNORMAL LOW
BUN: 4 — ABNORMAL LOW
Chloride: 101
Chloride: 102
Glucose, Bld: 80
Potassium: 3.7
Potassium: 3.7

## 2011-01-14 LAB — TSH: TSH: 4.216

## 2011-01-15 LAB — DIFFERENTIAL
Basophils Absolute: 0
Basophils Absolute: 0.1
Basophils Relative: 0
Basophils Relative: 1
Basophils Relative: 1
Basophils Relative: 1
Eosinophils Absolute: 0.1
Eosinophils Absolute: 0.2
Eosinophils Absolute: 0.2
Eosinophils Absolute: 0.3
Eosinophils Relative: 2
Eosinophils Relative: 5
Eosinophils Relative: 6 — ABNORMAL HIGH
Lymphocytes Relative: 27
Lymphocytes Relative: 32
Lymphs Abs: 1.4
Lymphs Abs: 1.6
Monocytes Absolute: 0.3
Monocytes Absolute: 0.4
Monocytes Absolute: 0.6
Monocytes Relative: 12
Monocytes Relative: 6
Neutro Abs: 2.1
Neutro Abs: 2.2
Neutro Abs: 2.9
Neutro Abs: 3.8
Neutrophils Relative %: 52
Neutrophils Relative %: 62

## 2011-01-15 LAB — CBC
HCT: 29.6 — ABNORMAL LOW
HCT: 30.2 — ABNORMAL LOW
HCT: 35.3 — ABNORMAL LOW
HCT: 35.9 — ABNORMAL LOW
Hemoglobin: 10 — ABNORMAL LOW
Hemoglobin: 10.5 — ABNORMAL LOW
Hemoglobin: 12.1
Hemoglobin: 9.9 — ABNORMAL LOW
MCHC: 33.7
MCHC: 33.8
MCHC: 34.4
MCHC: 34.7
MCV: 113.9 — ABNORMAL HIGH
MCV: 116.9 — ABNORMAL HIGH
MCV: 117.1 — ABNORMAL HIGH
MCV: 117.8 — ABNORMAL HIGH
RBC: 2.49 — ABNORMAL LOW
RBC: 2.57 — ABNORMAL LOW
RBC: 2.65 — ABNORMAL LOW
RBC: 2.99 — ABNORMAL LOW
RBC: 3.07 — ABNORMAL LOW
RDW: 17.1 — ABNORMAL HIGH
RDW: 17.3 — ABNORMAL HIGH
RDW: 17.3 — ABNORMAL HIGH
WBC: 6.5

## 2011-01-15 LAB — CARDIAC PANEL(CRET KIN+CKTOT+MB+TROPI)
CK, MB: 0.6
Total CK: 17
Total CK: 18
Troponin I: 0.02

## 2011-01-15 LAB — CSF CELL COUNT WITH DIFFERENTIAL: Tube #: 1

## 2011-01-15 LAB — LIPID PANEL
Cholesterol: 182
HDL: 44
Triglycerides: 142

## 2011-01-15 LAB — TSH: TSH: 7.128 — ABNORMAL HIGH

## 2011-01-15 LAB — HEPATITIS PANEL, ACUTE
HCV Ab: REACTIVE — AB
Hep A IgM: NEGATIVE
Hep B C IgM: NEGATIVE
Hepatitis B Surface Ag: NEGATIVE

## 2011-01-15 LAB — COMPREHENSIVE METABOLIC PANEL
AST: 60 — ABNORMAL HIGH
Alkaline Phosphatase: 140 — ABNORMAL HIGH
BUN: 5 — ABNORMAL LOW
CO2: 26
CO2: 27
Calcium: 8.7
Chloride: 101
Creatinine, Ser: 0.41
Creatinine, Ser: 0.44
GFR calc Af Amer: >60
GFR calc non Af Amer: >60
GFR calc non Af Amer: >60
Glucose, Bld: 79
Potassium: 3.4 — ABNORMAL LOW
Total Bilirubin: 0.5
Total Protein: 6.4

## 2011-01-15 LAB — IRON AND TIBC: Iron: 48

## 2011-01-15 LAB — PHOSPHORUS: Phosphorus: 4

## 2011-01-15 LAB — VITAMIN D 1,25 DIHYDROXY: Vit D, 1,25-Dihydroxy: 18 pg/mL (ref 15–75)

## 2011-01-15 LAB — BASIC METABOLIC PANEL
CO2: 25
CO2: 26
CO2: 27
Calcium: 8.3 — ABNORMAL LOW
Chloride: 102
Chloride: 106
Creatinine, Ser: 0.39 — ABNORMAL LOW
GFR calc Af Amer: >60
Glucose, Bld: 82
Glucose, Bld: 89
Potassium: 3.5
Sodium: 135
Sodium: 139

## 2011-01-15 LAB — HEPATIC FUNCTION PANEL
Alkaline Phosphatase: 103
Bilirubin, Direct: 0.1
Indirect Bilirubin: 0.4
Total Bilirubin: 0.5

## 2011-01-15 LAB — PROTEIN AND GLUCOSE, CSF: Glucose, CSF: 53

## 2011-01-15 LAB — T4, FREE: Free T4: 0.95

## 2011-01-15 LAB — MAGNESIUM
Magnesium: 1.8
Magnesium: 1.9

## 2011-01-15 LAB — HCV RNA QUANT

## 2011-01-15 LAB — APTT: aPTT: 26

## 2011-01-15 LAB — URINALYSIS, ROUTINE W REFLEX MICROSCOPIC
Protein, ur: NEGATIVE
Urobilinogen, UA: 0.2

## 2011-01-15 LAB — VITAMIN D 25 HYDROXY (VIT D DEFICIENCY, FRACTURES): Vit D, 25-Hydroxy: 12 — ABNORMAL LOW (ref 30–89)

## 2011-01-15 LAB — PROTIME-INR: Prothrombin Time: 13.2

## 2011-07-25 ENCOUNTER — Emergency Department (HOSPITAL_COMMUNITY)
Admission: EM | Admit: 2011-07-25 | Discharge: 2011-07-26 | Disposition: A | Discharge status: Home / Self Care | Payer: Medicare Other | Attending: Emergency Medicine | Admitting: Emergency Medicine

## 2011-07-25 ENCOUNTER — Encounter (HOSPITAL_COMMUNITY): Payer: Self-pay | Admitting: Emergency Medicine

## 2011-07-25 ENCOUNTER — Emergency Department (HOSPITAL_COMMUNITY): Payer: Medicare Other

## 2011-07-25 DIAGNOSIS — G610 Guillain-Barre syndrome: Secondary | ICD-10-CM | POA: Insufficient documentation

## 2011-07-25 DIAGNOSIS — F17200 Nicotine dependence, unspecified, uncomplicated: Secondary | ICD-10-CM | POA: Insufficient documentation

## 2011-07-25 DIAGNOSIS — M79609 Pain in unspecified limb: Secondary | ICD-10-CM | POA: Insufficient documentation

## 2011-07-25 DIAGNOSIS — W19XXXA Unspecified fall, initial encounter: Secondary | ICD-10-CM | POA: Insufficient documentation

## 2011-07-25 DIAGNOSIS — M25539 Pain in unspecified wrist: Secondary | ICD-10-CM | POA: Insufficient documentation

## 2011-07-25 DIAGNOSIS — S93409A Sprain of unspecified ligament of unspecified ankle, initial encounter: Secondary | ICD-10-CM | POA: Insufficient documentation

## 2011-07-25 DIAGNOSIS — S60229A Contusion of unspecified hand, initial encounter: Secondary | ICD-10-CM

## 2011-07-25 DIAGNOSIS — M25579 Pain in unspecified ankle and joints of unspecified foot: Secondary | ICD-10-CM | POA: Insufficient documentation

## 2011-07-25 HISTORY — DX: Guillain-Barre syndrome: G61.0

## 2011-07-25 HISTORY — DX: Disorder of thyroid, unspecified: E07.9

## 2011-07-25 HISTORY — DX: Unspecified visual loss: H54.7

## 2011-07-25 HISTORY — DX: Polyneuropathy, unspecified: G62.9

## 2011-07-25 NOTE — ED Notes (Signed)
Pt brought in by EMS for multiple falls over the past tow days.  Pt cc of pain in left wrist, right foot.  CC of n/v/d as well.

## 2011-07-26 MED ORDER — ONDANSETRON HCL 4 MG PO TABS
4.0000 mg | ORAL_TABLET | Freq: Once | ORAL | Status: AC
  Administered 2011-07-26: 4 mg via ORAL
  Filled 2011-07-26: qty 1

## 2011-07-26 MED ORDER — HYDROCODONE-ACETAMINOPHEN 5-325 MG PO TABS: 1.0000 | ORAL_TABLET | ORAL | Status: AC | PRN

## 2011-07-26 MED ORDER — HYDROCODONE-ACETAMINOPHEN 5-325 MG PO TABS
2.0000 | ORAL_TABLET | Freq: Once | ORAL | Status: AC
  Administered 2011-07-26: 2 via ORAL
  Filled 2011-07-26: qty 2

## 2011-07-26 NOTE — ED Provider Notes (Signed)
History     CSN: [id number]  Arrival date & time 07/25/11  2120   First MD Initiated Contact with Patient 07/25/11 2349      Chief Complaint  Patient presents with  . Fall  . Nausea  . Emesis  . Diarrhea  . Hand Pain  . Foot Pain    (Consider location/radiation/quality/duration/timing/severity/associated sxs/prior treatment) HPI [name] is a 51 y.o. female who presents to the Emergency Department complaining of left wrist and right ankle pain status post a fall several days ago. Patient is in the process of recovering from [facility] syndrome, which has affected her balance. She uses a walker and a cane for ambulation. She denies loss of consciousness, head injury, dizziness, shortness of breath, chest pain. The physician pictures for her is at [facility]. Past Surgical History  Procedure Date  . Cholecystectomy   . Tubal ligation   . Femur fracture surgery   . Ankle surgery     No family history on file.  History  Substance Use Topics  . Smoking status: Current Everyday Smoker -- 0.5 packs/day    Types: Cigarettes  . Smokeless tobacco: Not on file  . Alcohol Use: No    OB History    Grav Para Term Preterm Abortions TAB SAB Ect Mult Living                  Review of Systems ROS: Statement: All systems negative except as marked or noted in the HPI; Constitutional: Negative for fever and chills. ; ; Eyes: Negative for eye pain, redness and discharge. ; ; ENMT: Negative for ear pain, hoarseness, nasal congestion, sinus pressure and sore throat. ; ; Cardiovascular: Negative for chest pain, palpitations, diaphoresis, dyspnea and peripheral edema. ; ; Respiratory: Negative for cough, wheezing and stridor. ; ; Gastrointestinal: Negative for nausea, vomiting, diarrhea, abdominal pain, blood in stool, hematemesis, jaundice and rectal bleeding. . ; ; Genitourinary: Negative for dysuria, flank pain and hematuria. ; ; Musculoskeletal: Negative for back pain and neck  pain. Negative for swelling and trauma.; ; Skin: Negative for pruritus, rash, abrasions, blisters,  and skin lesion.; ; Neuro: Negative for headache, lightheadedness and neck stiffness. Negative for weakness, altered level of consciousness , altered mental status, , involuntary movement, seizure and syncope.     Allergies  Acetaminophen; Eggs or egg-derived products; Neurontin; and Codeine  Home Medications  No current outpatient prescriptions on file.  BP 84/52  Pulse 77  Temp(Src) 97.7 F (36.5 C) (Oral)  Resp 18  Ht 5\' 3"  (1.6 m)  Wt 170 lb (77.111 kg)  BMI 30.11 kg/m2  SpO2 94%  Physical Exam Physical examination:  Nursing notes reviewed; Vital signs and O2 SAT reviewed;  Constitutional: Well developed, Well nourished, Well hydrated, In no acute distress; Head:  Normocephalic, atraumatic; Eyes: EOMI, PERRL, No scleral icterus; ENMT: Mouth and pharynx normal, Mucous membranes moist; Neck: Supple, Full range of motion, No lymphadenopathy; Cardiovascular: Regular rate and rhythm, No murmur, rub, or gallop; Respiratory: Breath sounds clear & equal bilaterally, No rales, rhonchi, wheezes, or rub, Normal respiratory effort/excursion; Chest: Nontender, Movement normal; Abdomen: Soft, Nontender, Nondistended, Normal bowel sounds; Genitourinary: No CVA tenderness; Extremities: Pulses normal, Swelling to right lateral ankle. FROM. Mild tenderness with palpation. Bruising to foot. Left hand with bruising and swelling to lateral hand and over the 3rd,4th,5th MCP, No calf edema or asymmetry.; Neuro: AA&Ox3, Major CN grossly intact.  No gross focal motor or sensory deficits in extremities.; Skin:Bruising to forarms  and lower legs in various stages of healing.Warm, Dry  ED Course  Procedures (including critical care time)  Labs Reviewed - No data to display Dg Wrist Complete Left  07/26/2011  *RADIOLOGY REPORT*  Clinical Data: Fall.  Wrist injury and pain.  LEFT WRIST - COMPLETE 3+ VIEW  Comparison:  None.  Findings: No evidence of fracture or dislocation.  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  Small accessory ossicles are also seen overlying the area of the triangular fibrocartilage.  IMPRESSION:  1.  No acute findings. 2.  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  Original Report Authenticated By: Danae Orleans, M.D.   Dg Ankle Complete Right  07/26/2011  *RADIOLOGY REPORT*  Clinical Data: Fall.  Ankle injury and pain.  RIGHT ANKLE - COMPLETE 3+ VIEW  Comparison: None.  Findings: No evidence of acute fracture or dislocation.  Mild degenerative spurring is seen involving the distal tibia.  Soft tissue swelling is seen overlying the lateral malleolus.  No definite evidence of ankle joint effusion.  IMPRESSION:  1.  Lateral soft tissue swelling.  No evidence of fracture or dislocation. 2.  Mild degenerative spurring of the distal tibia.  Original Report Authenticated By: Danae Orleans, M.D.    MDM  Patient status post fall here with complaints of left hand and wrist pain and right ankle pain. X-rays do not reveal any acute process. There is lateral soft tissue swelling on the ankle. Patient has been given analgesics. She was placed in an ASO splint.Pt stable in ED with no significant deterioration in condition.The patient appears reasonably screened and/or stabilized for discharge and I doubt any other medical condition or other [facility] requiring further screening, evaluation, or treatment in the ED at this time prior to discharge.  MDM Reviewed: nursing note and vitals Interpretation: x-ray           Nicoletta Dress. Colon Branch, MD 07/26/11 1610

## 2011-07-26 NOTE — Discharge Instructions (Signed)
The ankle elevated as much as possible. Apply ice for comfort. He may use ibuprofen for discomfort. Use a stronger pain medicine as needed. Be sure to use a 4 point cane or walker for walking around her home. Follow up with your doctors.

## 2011-07-30 ENCOUNTER — Encounter (HOSPITAL_COMMUNITY): Payer: Self-pay | Admitting: *Deleted

## 2011-07-30 ENCOUNTER — Emergency Department (HOSPITAL_COMMUNITY): Payer: Medicare Other

## 2011-07-30 ENCOUNTER — Emergency Department (HOSPITAL_COMMUNITY)
Admission: EM | Admit: 2011-07-30 | Discharge: 2011-07-30 | Disposition: A | Discharge status: Home / Self Care | Payer: Medicare Other | Attending: Emergency Medicine | Admitting: Emergency Medicine

## 2011-07-30 DIAGNOSIS — S92901A Unspecified fracture of right foot, initial encounter for closed fracture: Secondary | ICD-10-CM

## 2011-07-30 DIAGNOSIS — X500XXA Overexertion from strenuous movement or load, initial encounter: Secondary | ICD-10-CM | POA: Insufficient documentation

## 2011-07-30 DIAGNOSIS — S92909A Unspecified fracture of unspecified foot, initial encounter for closed fracture: Secondary | ICD-10-CM | POA: Insufficient documentation

## 2011-07-30 DIAGNOSIS — R443 Hallucinations, unspecified: Secondary | ICD-10-CM

## 2011-07-30 DIAGNOSIS — M25579 Pain in unspecified ankle and joints of unspecified foot: Secondary | ICD-10-CM | POA: Insufficient documentation

## 2011-07-30 DIAGNOSIS — G589 Mononeuropathy, unspecified: Secondary | ICD-10-CM | POA: Insufficient documentation

## 2011-07-30 DIAGNOSIS — R6889 Other general symptoms and signs: Secondary | ICD-10-CM | POA: Insufficient documentation

## 2011-07-30 HISTORY — DX: Dorsalgia, unspecified: M54.9

## 2011-07-30 HISTORY — DX: Unspecified osteoarthritis, unspecified site: M19.90

## 2011-07-30 LAB — BASIC METABOLIC PANEL
BUN: 6 mg/dL (ref 6–23)
Chloride: 93 mEq/L — ABNORMAL LOW (ref 96–112)
GFR calc Af Amer: >90 mL/min (ref >90)
GFR calc non Af Amer: >90 mL/min (ref >90)
Potassium: 3.1 mEq/L — ABNORMAL LOW (ref 3.5–5.1)

## 2011-07-30 LAB — RAPID URINE DRUG SCREEN, HOSP PERFORMED
Barbiturates: NOT DETECTED
Benzodiazepines: NOT DETECTED
Cocaine: NOT DETECTED

## 2011-07-30 LAB — DIFFERENTIAL
Basophils Absolute: 0 10*3/uL (ref 0.0–0.1)
Basophils Relative: 0 % (ref 0–1)
Eosinophils Absolute: 0.1 10*3/uL (ref 0.0–0.7)
Monocytes Relative: 6 % (ref 3–12)
Neutro Abs: 6.8 10*3/uL (ref 1.7–7.7)
Neutrophils Relative %: 75 % (ref 43–77)

## 2011-07-30 LAB — URINALYSIS, ROUTINE W REFLEX MICROSCOPIC
Ketones, ur: 40 mg/dL — AB
Leukocytes, UA: NEGATIVE
Nitrite: POSITIVE — AB
Specific Gravity, Urine: 1.025 (ref 1.005–1.030)
Urobilinogen, UA: 2 mg/dL — ABNORMAL HIGH (ref 0.0–1.0)
pH: 6 (ref 5.0–8.0)

## 2011-07-30 LAB — URINE MICROSCOPIC-ADD ON

## 2011-07-30 LAB — CBC
MCH: 40.2 pg — ABNORMAL HIGH (ref 26.0–34.0)
MCHC: 35.6 g/dL (ref 30.0–36.0)
Platelets: 105 10*3/uL — ABNORMAL LOW (ref 150–400)
RDW: 13.2 % (ref 11.5–15.5)

## 2011-07-30 MED ORDER — HYDROMORPHONE HCL PF 1 MG/ML IJ SOLN
INTRAMUSCULAR | Status: AC
  Administered 2011-07-30: 1 mg
  Filled 2011-07-30: qty 1

## 2011-07-30 MED ORDER — ALPRAZOLAM 0.5 MG PO TABS
1.0000 mg | ORAL_TABLET | Freq: Once | ORAL | Status: AC
  Administered 2011-07-30: 1 mg via ORAL
  Filled 2011-07-30: qty 1

## 2011-07-30 MED ORDER — LEVETIRACETAM 750 MG PO TABS: 750.0000 mg | ORAL_TABLET | Freq: Two times a day (BID) | ORAL | Status: DC

## 2011-07-30 MED ORDER — POTASSIUM CHLORIDE CRYS ER 20 MEQ PO TBCR
40.0000 meq | EXTENDED_RELEASE_TABLET | Freq: Once | ORAL | Status: DC
  Filled 2011-07-30: qty 2

## 2011-07-30 MED ORDER — HYDROMORPHONE HCL PF 1 MG/ML IJ SOLN
1.0000 mg | Freq: Once | INTRAMUSCULAR | Status: AC
  Administered 2011-07-30: 1 mg via INTRAMUSCULAR
  Filled 2011-07-30: qty 1

## 2011-07-30 MED ORDER — OXYCODONE-ACETAMINOPHEN 5-325 MG PO TABS
1.0000 | ORAL_TABLET | Freq: Once | ORAL | Status: AC
  Administered 2011-07-30: 1 via ORAL
  Filled 2011-07-30: qty 1

## 2011-07-30 MED ORDER — ALPRAZOLAM 1 MG PO TABS: 1.0000 mg | ORAL_TABLET | Freq: Three times a day (TID) | ORAL | Status: AC | PRN

## 2011-07-30 MED ORDER — ZOLPIDEM TARTRATE 10 MG PO TABS: 10.0000 mg | ORAL_TABLET | Freq: Every evening | ORAL | Status: DC | PRN

## 2011-07-30 MED ORDER — POTASSIUM CHLORIDE CRYS ER 20 MEQ PO TBCR
40.0000 meq | EXTENDED_RELEASE_TABLET | Freq: Once | ORAL | Status: AC
  Administered 2011-07-30: 40 meq via ORAL
  Filled 2011-07-30: qty 2

## 2011-07-30 MED ORDER — OXYCODONE-ACETAMINOPHEN 5-325 MG PO TABS: 2.0000 | ORAL_TABLET | ORAL | Status: AC | PRN

## 2011-07-30 NOTE — ED Notes (Signed)
Dr. Adriana Simas made aware of pt and family complaint of length of time in ER. Apologized to pt/family for length. Dr. Adriana Simas will go to see pt.

## 2011-07-30 NOTE — ED Notes (Signed)
Discharge instructions reviewed with pt, questions answered. Pt verbalized understanding.  

## 2011-07-30 NOTE — ED Notes (Signed)
C/o hallucinations/delusions since last night.  Reports seeing people that she knows that aren't there and seeing cars in front of her house that aren't there.  Pt reports having seizure x 3 days ago.  Also c/o headache x 1 wk.  C/o lower back pain also.

## 2011-07-30 NOTE — ED Notes (Signed)
Pt understands that she has to have CT of foot. Husband at [facility].

## 2011-07-30 NOTE — ED Notes (Signed)
Dr Cook in room with pt at this time  

## 2011-07-30 NOTE — ED Notes (Signed)
Pt given Potassium po earlier in shift. Clarified dose at 15:45 with Dr. Adriana Simas. Order to [employer]

## 2011-07-30 NOTE — ED Provider Notes (Signed)
History    This chart was scribed for Doug Sou, MD, MD by Smitty Pluck. The patient was seen in room APA04 and the patient's care was started at 11:56AM.   CSN: [id number]  Arrival date & time 07/30/11  4098   First MD Initiated Contact with Patient 07/30/11 1153      Chief Complaint  Patient presents with  . Hallucinations    (Consider location/radiation/quality/duration/timing/severity/associated sxs/prior treatment) The history is provided by the patient.   Brandy SMOLA is a 51 y.o. female who presents to the Emergency Department complaining of hallucinations onset 1 week ago. Pt reports that she sees her best friend talking to her although there is no one there. Pt reports hearing music in her head. She has been talking to her husband although he is not in the room. Pt's spouse reports that pt has had seizure 3 days ago (pt's body was tense and eyes bulging out). Pt has seen psychiatrist for depression. Denies ever having hallucinations before. She has seen Dr Estella Husk in the past as neurolgist, the last time 6 months ago. Pt uses xanax. Pt reports that she had stomach virus (nausea, emesis) onset 1 week ago with improved symptoms. Pt reports being in ED for foot injury due to twisting ankle while standing up from toilet and still has moderate pain. She reports having mild back pain. Pt has hx of blindness and Gullian-Barre.  PCP is Dr. Loney Hering  Past Medical History  Diagnosis Date  . Guillain-Barre   . Blind   . Neuropathy   . Thyroid disease   . Back pain   . Arthritis     Past Surgical History  Procedure Date  . Cholecystectomy   . Tubal ligation   . Femur fracture surgery   . Ankle surgery   . Back surgery     No family history on file.  History  Substance Use Topics  . Smoking status: Current Everyday Smoker -- 0.5 packs/day    Types: Cigarettes  . Smokeless tobacco: Not on file  . Alcohol Use: No    OB History    Grav Para Term Preterm Abortions TAB  SAB Ect Mult Living                  Review of Systems  Constitutional: Negative.   HENT: Negative.   Respiratory: Negative.   Cardiovascular: Negative.   Gastrointestinal: Negative.   Musculoskeletal: Negative.   Skin: Negative.   Neurological: Negative.   Hematological: Negative.   Psychiatric/Behavioral: Positive for hallucinations.    Allergies  Neurontin; Acetaminophen; Eggs or egg-derived products; and Codeine  Home Medications   Current Outpatient Rx  Name Route Sig Dispense Refill  . CYANOCOBALAMIN 1000 MCG/ML IJ SOLN Intramuscular Inject 1,000 mcg into the muscle once a week.    Marland Kitchen DICLOFENAC SODIUM 1 % TD GEL Topical Apply 1 application topically 4 (four) times daily as needed. For neuropathy pains    . FENTANYL 75 MCG/HR TD PT72 Transdermal Place 1 patch onto the skin every 3 (three) days.    . IBUPROFEN 200 MG PO TABS Oral Take 200-600 mg by mouth every 6 (six) hours as needed. For pain    . LEVOTHYROXINE SODIUM 100 MCG PO TABS Oral Take 100 mcg by mouth daily.    Marland Kitchen PREGABALIN 100 MG PO CAPS Oral Take 100 mg by mouth every morning.    Marland Kitchen HYDROCODONE-ACETAMINOPHEN 5-325 MG PO TABS Oral Take 1 tablet by mouth every 4 (four) hours as  needed for pain. 15 tablet 0    BP 159/114  Pulse 110  Temp(Src) 98.3 F (36.8 C) (Oral)  Resp 20  Ht 5\' 4"  (1.626 m)  Wt 170 lb (77.111 kg)  BMI 29.18 kg/m2  SpO2 98%  Physical Exam  Nursing note and vitals reviewed. Constitutional: She is oriented to person, place, and time. She appears well-developed and well-nourished. No distress.  HENT:  Head: Normocephalic and atraumatic.  Eyes: Conjunctivae are normal. Pupils are equal, round, and reactive to light.  Neck: Normal range of motion. Neck supple. No tracheal deviation present. No thyromegaly present.  Cardiovascular: Normal rate and regular rhythm.   No murmur heard. Pulmonary/Chest: Effort normal and breath sounds normal.  Abdominal: Soft. Bowel sounds are normal. She  exhibits no distension. There is no tenderness.  Musculoskeletal: Normal range of motion. She exhibits no edema and no tenderness.       Left foot ecchymotic and left ankle is ecchyomotic distally    Neurological: She is oriented to person, place, and time. No cranial nerve deficit. Coordination normal.  Skin: Skin is warm and dry. No rash noted.  Psychiatric: She has a normal mood and affect. Her behavior is normal.    ED Course  Procedures (including critical care time) DIAGNOSTIC STUDIES: Oxygen Saturation is 98% on room air, normal by my interpretation.    COORDINATION OF CARE: 12:10PM EDP discusses pt ED treatment with ptand husband.    Labs Reviewed  CBC - Abnormal; Notable for the following:    RBC 3.86 (*)    Hemoglobin 15.5 (*)    MCV 112.7 (*)    MCH 40.2 (*)    Platelets 105 (*)    All other components within normal limits  BASIC METABOLIC PANEL - Abnormal; Notable for the following:    Sodium 134 (*)    Potassium 3.1 (*)    Chloride 93 (*)    Glucose, Bld 103 (*)    Creatinine, Ser 0.45 (*)    All other components within normal limits  URINALYSIS, ROUTINE W REFLEX MICROSCOPIC - Abnormal; Notable for the following:    Hgb urine dipstick LARGE (*)    Bilirubin Urine MODERATE (*)    Ketones, ur 40 (*)    Protein, ur 100 (*)    Urobilinogen, UA 2.0 (*)    Nitrite POSITIVE (*)    All other components within normal limits  URINE MICROSCOPIC-ADD ON - Abnormal; Notable for the following:    Bacteria, UA MANY (*)    All other components within normal limits  DIFFERENTIAL  URINE RAPID DRUG SCREEN (HOSP PERFORMED)   Ct Head Wo Contrast  07/30/2011  *RADIOLOGY REPORT*  Clinical Data: Confusion, hallucinations, no known injury  CT HEAD WITHOUT CONTRAST  Technique:  Contiguous axial images were obtained from the base of the skull through the vertex without contrast.  Comparison: 12/22/2007  Findings: Normal ventricular morphology. No midline shift or mass effect. Normal  appearance of brain parenchyma. No intracranial hemorrhage, mass lesion, or evidence of acute infarction. No extra-axial fluid collections. Visualized paranasal sinuses and mastoid air cells clear. No acute osseous findings.  IMPRESSION: No acute intracranial abnormalities.  Original Report Authenticated By: Lollie Marrow, M.D.     No diagnosis found.    MDM  Spoke with Dr.Runheim,suggest outpatient MRI scan of brain with and without contrast in light of hallucinations and recent seizure. Prescription for Keppra 750 mg twice a day, one month supply Patient states she has run out of  Xanax. Will write prescription for 6 tablets. She is to followup with [facility] tomorrow for further refills I've spoken with [facility] and discussed my concerns that patient has polypharmacy and is running out of her medicines more frequently than prescribed. Also we'll write prescription for Ambien 10 mg dispensed or 3 tablets, as she's run out.  CT scan of foot ordered and pending to be checked by Dr.Cook Diagnoses #1 hallucinations #2 seizure #3 hypokalemia #4 closed fracture of right foot     I personally performed the services described in this documentation, which was scribed in my presence. The recorded information has been reviewed and considered.    Doug Sou, MD 07/30/11 [phone]

## 2011-07-30 NOTE — Discharge Instructions (Signed)
Foot Fracture Your caregiver has diagnosed you as having a foot fracture (broken bone). Your foot has many bones. You have a fracture, or break, in one of these bones. In some cases, your doctor may put on a splint or removable fracture boot until the swelling in your foot has lessened. A cast may or may not be required. HOME CARE INSTRUCTIONS  If you do not have a cast or splint:  You may bear weight on your injured foot as tolerated or advised.   Do not put any weight on your injured foot for as long as directed by your caregiver. Slowly increase the amount of time you walk on the foot as the pain and swelling allows or as advised.   Use crutches until you can bear weight without pain. A gradual increase in weight bearing may help.   Apply ice to the injury for 15 to 20 minutes each hour while awake for the first 2 days. Put the ice in a plastic bag and place a towel between the bag of ice and your skin.   If an ace bandage (stretchy, elastic wrapping bandage) was applied, you may re-wrap it if ankle is more painful or your toes become cold and swollen.  If you have a cast or splint:  Use your crutches for as long as directed by your caregiver.   To lessen the swelling, keep the injured foot elevated on pillows while lying down or sitting. Elevate your foot above your heart.   Apply ice to the injury for 15 to 20 minutes each hour while awake for the first 2 days. Put the ice in a plastic bag and place a thin towel between the bag of ice and your cast.   Plaster or fiberglass cast:   Do not try to scratch the skin under the cast using a sharp or pointed object down the cast.   Check the skin around the cast every day. You may put lotion on any red or sore areas.   Keep your cast clean and dry.   Plaster splint:   Wear the splint until you are seen for a follow-up examination.   You may loosen the elastic around the splint if your toes become numb, tingle, or turn blue or cold. Do  not rest it on anything harder than a pillow in the first 24 hours.   Do not put pressure on any part of your splint. Use your crutches as directed.   Keep your splint dry. It can be protected during bathing with a plastic bag. Do not lower the splint into water.   If you have a fracture boot you may remove it to shower. Bear weight only as instructed by your caregiver.   Only take over-the-counter or prescription medicines for pain, discomfort, or fever as directed by your caregiver.  SEEK IMMEDIATE MEDICAL CARE IF:   Your cast gets damaged or breaks.   You have continued severe pain or more swelling than you did before the cast was put on.   Your skin or nails of your casted foot turn blue, gray, feel cold or numb.   There is a bad smell from your cast.   There is severe pain with movement of your toes.   There are new stains and/or drainage coming from under the cast.  MAKE SURE YOU:   Understand these instructions.   Will watch your condition.   Will get help right away if you are not doing well or get   worse.  Document Released: 03/28/2000 Document Revised: 03/20/2011 Document Reviewed: 05/04/2008 [facility] Patient Information 2012 [place], [place].  You have a fracture of 4 bones in your foot. Additionally you have fractured the fibula bone which is on the ankle.  I've discussed your case with Dr. Romeo Apple. Call his office in the morning for a followup visit. Ankle brace, crutches, ice, elevate. Medication for pain and nausea.  Also have an MRI of your brain scheduled for tomorrow at 6 PM at [facility]

## 2011-07-30 NOTE — ED Notes (Signed)
Cam walker applied and crutch demonstration given to pt and pt voiced understanding.

## 2011-07-30 NOTE — ED Provider Notes (Signed)
History     CSN: [id number]  Arrival date & time 07/30/11  7829   First MD Initiated Contact with Patient 07/30/11 1153      Chief Complaint  Patient presents with  . Hallucinations    (Consider location/radiation/quality/duration/timing/severity/associated sxs/prior treatment) HPI  Past Medical History  Diagnosis Date  . Guillain-Barre   . Blind   . Neuropathy   . Thyroid disease   . Back pain   . Arthritis     Past Surgical History  Procedure Date  . Cholecystectomy   . Tubal ligation   . Femur fracture surgery   . Ankle surgery   . Back surgery     No family history on file.  History  Substance Use Topics  . Smoking status: Current Everyday Smoker -- 0.5 packs/day    Types: Cigarettes  . Smokeless tobacco: Not on file  . Alcohol Use: No    OB History    Grav Para Term Preterm Abortions TAB SAB Ect Mult Living                  Review of Systems  Allergies  Neurontin; Acetaminophen; Eggs or egg-derived products; and Codeine  Home Medications   Current Outpatient Rx  Name Route Sig Dispense Refill  . CYANOCOBALAMIN 1000 MCG/ML IJ SOLN Intramuscular Inject 1,000 mcg into the muscle once a week.    Marland Kitchen DICLOFENAC SODIUM 1 % TD GEL Topical Apply 1 application topically 4 (four) times daily as needed. For neuropathy pains    . FENTANYL 75 MCG/HR TD PT72 Transdermal Place 1 patch onto the skin every 3 (three) days.    . IBUPROFEN 200 MG PO TABS Oral Take 200-600 mg by mouth every 6 (six) hours as needed. For pain    . LEVOTHYROXINE SODIUM 100 MCG PO TABS Oral Take 100 mcg by mouth daily.    Marland Kitchen PREGABALIN 100 MG PO CAPS Oral Take 100 mg by mouth every morning.    Marland Kitchen HYDROCODONE-ACETAMINOPHEN 5-325 MG PO TABS Oral Take 1 tablet by mouth every 4 (four) hours as needed for pain. 15 tablet 0    BP 159/114  Pulse 110  Temp(Src) 98.3 F (36.8 C) (Oral)  Resp 20  Ht 5\' 4"  (1.626 m)  Wt 170 lb (77.111 kg)  BMI 29.18 kg/m2  SpO2 98%  Physical Exam  ED  Course  Procedures (including critical care time)  Labs Reviewed  CBC - Abnormal; Notable for the following:    RBC 3.86 (*)    Hemoglobin 15.5 (*)    MCV 112.7 (*)    MCH 40.2 (*)    Platelets 105 (*)    All other components within normal limits  BASIC METABOLIC PANEL - Abnormal; Notable for the following:    Sodium 134 (*)    Potassium 3.1 (*)    Chloride 93 (*)    Glucose, Bld 103 (*)    Creatinine, Ser 0.45 (*)    All other components within normal limits  URINALYSIS, ROUTINE W REFLEX MICROSCOPIC - Abnormal; Notable for the following:    Hgb urine dipstick LARGE (*)    Bilirubin Urine MODERATE (*)    Ketones, ur 40 (*)    Protein, ur 100 (*)    Urobilinogen, UA 2.0 (*)    Nitrite POSITIVE (*)    All other components within normal limits  URINE MICROSCOPIC-ADD ON - Abnormal; Notable for the following:    Bacteria, UA MANY (*)    All other components within  normal limits  DIFFERENTIAL  URINE RAPID DRUG SCREEN (HOSP PERFORMED)   No results found.   No diagnosis found.    MDM  Duplicate note        Doug Sou, MD 08/01/11 [phone]

## 2011-07-30 NOTE — ED Provider Notes (Addendum)
History     CSN: [id number]  Arrival date & time 07/30/11  4098   First MD Initiated Contact with Patient 07/30/11 1153      Chief Complaint  Patient presents with  . Hallucinations    (Consider location/radiation/quality/duration/timing/severity/associated sxs/prior treatment) HPI  Past Medical History  Diagnosis Date  . Guillain-Barre   . Blind   . Neuropathy   . Thyroid disease   . Back pain   . Arthritis     Past Surgical History  Procedure Date  . Cholecystectomy   . Tubal ligation   . Femur fracture surgery   . Ankle surgery   . Back surgery     No family history on file.  History  Substance Use Topics  . Smoking status: Current Everyday Smoker -- 0.5 packs/day    Types: Cigarettes  . Smokeless tobacco: Not on file  . Alcohol Use: No    OB History    Grav Para Term Preterm Abortions TAB SAB Ect Mult Living                  Review of Systems  Allergies  Neurontin; Acetaminophen; Eggs or egg-derived products; and Codeine  Home Medications   Current Outpatient Rx  Name Route Sig Dispense Refill  . ALPRAZOLAM 1 MG PO TABS Oral Take 1 mg by mouth 3 (three) times daily as needed. For anxiety    . CYANOCOBALAMIN 1000 MCG/ML IJ SOLN Intramuscular Inject 1,000 mcg into the muscle once a week.    . CYCLOBENZAPRINE HCL 10 MG PO TABS Oral Take 10 mg by mouth 2 (two) times daily.    Marland Kitchen DICLOFENAC SODIUM 1 % TD GEL Topical Apply 1 application topically 4 (four) times daily as needed. For neuropathy pains    . FENTANYL 100 MCG/HR TD PT72 Transdermal Place 1 patch onto the skin every 3 (three) days.    . IBUPROFEN 200 MG PO TABS Oral Take 200-600 mg by mouth every 6 (six) hours as needed. For pain    . LEVOTHYROXINE SODIUM 50 MCG PO TABS Oral Take 50 mcg by mouth every morning.    . OXYCODONE-ACETAMINOPHEN 10-325 MG PO TABS Oral Take 1 tablet by mouth 4 (four) times daily as needed. For break through pain    . PREGABALIN 100 MG PO CAPS Oral Take 100 mg by  mouth 3 (three) times daily.     Marland Kitchen PROMETHAZINE HCL 25 MG PO TABS Oral Take 25 mg by mouth every 6 (six) hours as needed. For nausea    . ZOLPIDEM TARTRATE 10 MG PO TABS Oral Take 10 mg by mouth at bedtime.    . ALPRAZOLAM 1 MG PO TABS Oral Take 1 tablet (1 mg total) by mouth 3 (three) times daily as needed for sleep or anxiety. 6 tablet 0  . HYDROCODONE-ACETAMINOPHEN 5-325 MG PO TABS Oral Take 1 tablet by mouth every 4 (four) hours as needed for pain. 15 tablet 0  . LEVETIRACETAM 750 MG PO TABS Oral Take 1 tablet (750 mg total) by mouth 2 (two) times daily. 60 tablet 0  . ZOLPIDEM TARTRATE 10 MG PO TABS Oral Take 1 tablet (10 mg total) by mouth at bedtime as needed for sleep. 3 tablet 0    BP 136/101  Pulse 121  Temp(Src) 98.3 F (36.8 C) (Oral)  Resp 17  Ht 5\' 4"  (1.626 m)  Wt 170 lb (77.111 kg)  BMI 29.18 kg/m2  SpO2 100%  Physical Exam  ED Course  Procedures (  including critical care time)  Labs Reviewed  CBC - Abnormal; Notable for the following:    RBC 3.86 (*)    Hemoglobin 15.5 (*)    MCV 112.7 (*)    MCH 40.2 (*)    Platelets 105 (*)    All other components within normal limits  BASIC METABOLIC PANEL - Abnormal; Notable for the following:    Sodium 134 (*)    Potassium 3.1 (*)    Chloride 93 (*)    Glucose, Bld 103 (*)    Creatinine, Ser 0.45 (*)    All other components within normal limits  URINALYSIS, ROUTINE W REFLEX MICROSCOPIC - Abnormal; Notable for the following:    Hgb urine dipstick LARGE (*)    Bilirubin Urine MODERATE (*)    Ketones, ur 40 (*)    Protein, ur 100 (*)    Urobilinogen, UA 2.0 (*)    Nitrite POSITIVE (*)    All other components within normal limits  URINE MICROSCOPIC-ADD ON - Abnormal; Notable for the following:    Bacteria, UA MANY (*)    All other components within normal limits  DIFFERENTIAL  URINE RAPID DRUG SCREEN (HOSP PERFORMED)  URINE CULTURE   Dg Ankle Complete Right  07/30/2011  *RADIOLOGY REPORT*  Clinical Data: fibular  fracture seen on CT scan; pain and bruising  RIGHT ANKLE - COMPLETE 3+ VIEW  Comparison: CT scan  performed earlier today and plain film dated July 25, 2011  Findings: Prominent soft tissue swelling is noted laterally over the ankle.  The ankle mortise is intact. An oblique fracture line is now seen within the distal fibular shaft.  The alignment remains normal.  IMPRESSION: Nondisplaced oblique fracture of the distal  right fibula.  Original Report Authenticated By: Brandon Melnick, M.D.   Ct Head Wo Contrast  07/30/2011  *RADIOLOGY REPORT*  Clinical Data: Confusion, hallucinations, no known injury  CT HEAD WITHOUT CONTRAST  Technique:  Contiguous axial images were obtained from the base of the skull through the vertex without contrast.  Comparison: 12/22/2007  Findings: Normal ventricular morphology. No midline shift or mass effect. Normal appearance of brain parenchyma. No intracranial hemorrhage, mass lesion, or evidence of acute infarction. No extra-axial fluid collections. Visualized paranasal sinuses and mastoid air cells clear. No acute osseous findings.  IMPRESSION: No acute intracranial abnormalities.  Original Report Authenticated By: Lollie Marrow, M.D.   Ct Foot Right Wo Contrast  07/30/2011  *RADIOLOGY REPORT*  Clinical Data: Injury, pain with foot fractures.  CT OF THE RIGHT FOOT WITHOUT CONTRAST  Technique:  Multidetector CT imaging was performed according to the standard protocol. Multiplanar CT image reconstructions were also generated.  Comparison: Plain films earlier this same day. Plain films of the right ankle 07/25/2011.  Findings: There is partial visualization of a fracture of the distal fibula.  The patient has a nondisplaced fracture through the proximal metaphysis of the second metatarsal.  Fracture through the proximal metaphysis of the third metatarsal shows minimal superior displacement of the distal fragment.  Also seen is a fracture through the base of the fourth metatarsal  which is comminuted with extension to the articular surface.  The metaphysis is laterally displaced 0.3 cm.  The patient also is a fracture of the neck of the fifth metatarsal which shows very mild medial angulation.  No other fracture is identified.  Degenerative change is seen at the tibiotalar first MTP joints.  Soft tissue swelling is present about the foot.  IMPRESSION:  1.  Fractures of the bases of the second, third and fourth metatarsals and neck of the fifth metatarsal. 2.  Distal fibular fracture.  Plain films of the ankle are recommended.  Please note that this fracture is not visible on the patient's comparison ankle plain films.  Original Report Authenticated By: Bernadene Bell. D'ALESSIO, M.D.   Dg Foot Complete Right  07/30/2011  *RADIOLOGY REPORT*  Clinical Data: Inverted foot, pain  RIGHT FOOT COMPLETE - 3+ VIEW  Comparison: None.  Findings: Fractures involving the base of the second, third, and fourth metatarsals.   The third and fourth metatarsals are mildly displaced laterally.  Lisfranc's ligament appears within normal limits.  Deformity of the distal fifth metatarsal, possibly reflecting a fracture, age indeterminate.  On the lateral view, there is an oblique lucency through the distal fibula, possibly reflecting a nondisplaced fracture.  Moderate soft tissue swelling.  IMPRESSION: Fracture at the base of the second metatarsal.  Displaced fractures at the base of the third and fourth metatarsals.  Deformity of the distal fifth metatarsal, possibly reflecting a fracture, age indeterminate.  Oblique lucency through the distal fibula, possibly reflecting a nondisplaced fracture. Correlate with the site of the patient's pain.  Lisfranc's ligament appears within normal limits.  However, given the number of fractures, CT should be considered for further evaluation.  These results were called by telephone on 07/30/2011  at  1610 hours to  Dr. Doug Sou, who verbally acknowledged these results.   Original Report Authenticated By: Charline Bills, M.D.     1. Hallucination       MDM  I discussed  foot fracture and ankle fracture with Dr. Romeo Apple. He will followup in the office. Ankle and foot immobilization and crutches ordered.  an MRI of her brain has been scheduled for Thursday 6 PM.  Prescriptions for pain and small prescription for Xanax given.  These findings were discussed with patient and her husband in great detail.        Donnetta Hutching, MD 07/30/11 2138  Donnetta Hutching, MD 07/30/11 2138

## 2011-07-31 ENCOUNTER — Telehealth: Payer: Self-pay | Admitting: Orthopedic Surgery

## 2011-07-31 ENCOUNTER — Inpatient Hospital Stay (HOSPITAL_COMMUNITY)
Admit: 2011-07-31 | Discharge: 2011-07-31 | Payer: Medicare Other | Attending: Emergency Medicine | Admitting: Emergency Medicine

## 2011-07-31 NOTE — Telephone Encounter (Signed)
Patient has called to request appointment, mainly for problem of Right ankle injury (?Fracture), folloiwing treatment at [facility] Emergency Department on 07/25/11 and 07/30/11.  Patient states she had fallen on 07/25/11, "and things have become confusing ever since."   She has had CT scans and MRI for other medical issues and is also following up as advised with her primary care physician. Please review Emergency room records and films and please advise about urgency of appointment for ankle.  Patient's ph# is [phone].

## 2011-08-01 LAB — URINE CULTURE: Culture  Setup Time: 201304180226

## 2011-08-01 NOTE — Telephone Encounter (Signed)
WAS UPPOSED TO CALL THURS   HAS MENTAL ISSUES

## 2011-08-02 NOTE — ED Notes (Signed)
+   Urine Chart sent to EDP office for review. 

## 2011-08-03 NOTE — ED Notes (Signed)
Chart returned from EDP office. Prescribed Bactrim DS 1 PO BID for 3 days. Prescribed by Betsey Holiday PA-C.

## 2011-08-04 ENCOUNTER — Ambulatory Visit (HOSPITAL_COMMUNITY): Payer: Medicare Other | Attending: Emergency Medicine

## 2011-08-04 NOTE — ED Notes (Signed)
Prescription for bactrim ds 1 po bid x 3 days called to [facility] pharmacy.

## 2011-08-12 ENCOUNTER — Ambulatory Visit (HOSPITAL_COMMUNITY): Admission: RE | Admit: 2011-08-12 | Payer: Medicare Other | Source: Ambulatory Visit

## 2011-10-06 ENCOUNTER — Ambulatory Visit (HOSPITAL_COMMUNITY): Payer: Medicare Other

## 2011-10-08 NOTE — Telephone Encounter (Signed)
No further response from patient, after attempts to reach.

## 2011-11-10 ENCOUNTER — Emergency Department (HOSPITAL_COMMUNITY): Payer: Medicare Other

## 2011-11-10 ENCOUNTER — Emergency Department (HOSPITAL_COMMUNITY)
Admission: EM | Admit: 2011-11-10 | Discharge: 2011-11-10 | Disposition: A | Discharge status: Home / Self Care | Payer: Medicare Other | Attending: Emergency Medicine | Admitting: Emergency Medicine

## 2011-11-10 ENCOUNTER — Encounter (HOSPITAL_COMMUNITY): Payer: Self-pay

## 2011-11-10 DIAGNOSIS — Z8739 Personal history of other diseases of the musculoskeletal system and connective tissue: Secondary | ICD-10-CM | POA: Insufficient documentation

## 2011-11-10 DIAGNOSIS — H543 Unqualified visual loss, both eyes: Secondary | ICD-10-CM | POA: Insufficient documentation

## 2011-11-10 DIAGNOSIS — E876 Hypokalemia: Secondary | ICD-10-CM

## 2011-11-10 DIAGNOSIS — F17200 Nicotine dependence, unspecified, uncomplicated: Secondary | ICD-10-CM | POA: Insufficient documentation

## 2011-11-10 DIAGNOSIS — R569 Unspecified convulsions: Secondary | ICD-10-CM | POA: Insufficient documentation

## 2011-11-10 DIAGNOSIS — Y92009 Unspecified place in unspecified non-institutional (private) residence as the place of occurrence of the external cause: Secondary | ICD-10-CM | POA: Insufficient documentation

## 2011-11-10 DIAGNOSIS — Z9089 Acquired absence of other organs: Secondary | ICD-10-CM | POA: Insufficient documentation

## 2011-11-10 DIAGNOSIS — S82843A Displaced bimalleolar fracture of unspecified lower leg, initial encounter for closed fracture: Secondary | ICD-10-CM | POA: Insufficient documentation

## 2011-11-10 DIAGNOSIS — E079 Disorder of thyroid, unspecified: Secondary | ICD-10-CM | POA: Insufficient documentation

## 2011-11-10 DIAGNOSIS — S82839A Other fracture of upper and lower end of unspecified fibula, initial encounter for closed fracture: Secondary | ICD-10-CM | POA: Insufficient documentation

## 2011-11-10 DIAGNOSIS — Z79899 Other long term (current) drug therapy: Secondary | ICD-10-CM | POA: Insufficient documentation

## 2011-11-10 DIAGNOSIS — S92309A Fracture of unspecified metatarsal bone(s), unspecified foot, initial encounter for closed fracture: Secondary | ICD-10-CM | POA: Insufficient documentation

## 2011-11-10 DIAGNOSIS — X500XXA Overexertion from strenuous movement or load, initial encounter: Secondary | ICD-10-CM | POA: Insufficient documentation

## 2011-11-10 HISTORY — DX: Unspecified convulsions: R56.9

## 2011-11-10 LAB — URINALYSIS, ROUTINE W REFLEX MICROSCOPIC
Nitrite: NEGATIVE
Specific Gravity, Urine: 1.025 (ref 1.005–1.030)
Urobilinogen, UA: 2 mg/dL — ABNORMAL HIGH (ref 0.0–1.0)
pH: 6 (ref 5.0–8.0)

## 2011-11-10 LAB — BASIC METABOLIC PANEL
BUN: 10 mg/dL (ref 6–23)
CO2: 33 mEq/L — ABNORMAL HIGH (ref 19–32)
Chloride: 93 mEq/L — ABNORMAL LOW (ref 96–112)
Glucose, Bld: 115 mg/dL — ABNORMAL HIGH (ref 70–99)
Potassium: 2.6 mEq/L — CL (ref 3.5–5.1)
Sodium: 138 mEq/L (ref 135–145)

## 2011-11-10 LAB — CBC WITH DIFFERENTIAL/PLATELET
Basophils Relative: 0 % (ref 0–1)
Eosinophils Relative: 0 % (ref 0–5)
Hemoglobin: 12.4 g/dL (ref 12.0–15.0)
MCH: 41.1 pg — ABNORMAL HIGH (ref 26.0–34.0)
MCV: 113.9 fL — ABNORMAL HIGH (ref 78.0–100.0)
Monocytes Relative: 7 % (ref 3–12)
Neutrophils Relative %: 83 % — ABNORMAL HIGH (ref 43–77)
Platelets: 97 10*3/uL — ABNORMAL LOW (ref 150–400)
RBC: 3.02 MIL/uL — ABNORMAL LOW (ref 3.87–5.11)
WBC: 7.4 10*3/uL (ref 4.0–10.5)

## 2011-11-10 LAB — URINE MICROSCOPIC-ADD ON

## 2011-11-10 MED ORDER — HYDROMORPHONE HCL PF 1 MG/ML IJ SOLN
1.0000 mg | Freq: Once | INTRAMUSCULAR | Status: AC
  Administered 2011-11-10: 1 mg via INTRAVENOUS
  Filled 2011-11-10 (×2): qty 1

## 2011-11-10 MED ORDER — OXYCODONE HCL 5 MG PO TABS: 5.0000 mg | ORAL_TABLET | ORAL | Status: AC | PRN

## 2011-11-10 MED ORDER — HYDROMORPHONE HCL PF 1 MG/ML IJ SOLN
INTRAMUSCULAR | Status: AC
  Administered 2011-11-10: 1 mg
  Filled 2011-11-10: qty 1

## 2011-11-10 MED ORDER — POTASSIUM CHLORIDE 10 MEQ/100ML IV SOLN
10.0000 meq | INTRAVENOUS | Status: AC
  Administered 2011-11-10 (×2): 10 meq via INTRAVENOUS
  Filled 2011-11-10 (×2): qty 100

## 2011-11-10 MED ORDER — POTASSIUM CHLORIDE ER 10 MEQ PO TBCR: 20.0000 meq | EXTENDED_RELEASE_TABLET | Freq: Two times a day (BID) | ORAL | Status: DC

## 2011-11-10 MED ORDER — POTASSIUM CHLORIDE CRYS ER 20 MEQ PO TBCR
40.0000 meq | EXTENDED_RELEASE_TABLET | Freq: Once | ORAL | Status: AC
  Administered 2011-11-10: 40 meq via ORAL
  Filled 2011-11-10: qty 2

## 2011-11-10 NOTE — ED Notes (Signed)
Pt didn't want crutches. EDP made aware. Pt instructed to not put any weight on extremity.

## 2011-11-10 NOTE — ED Notes (Signed)
EDP at patients bedside  

## 2011-11-10 NOTE — ED Notes (Signed)
Pt aware will be here approx 2 more hours for the 2 runs of potassium. Nad.

## 2011-11-10 NOTE — ED Notes (Signed)
Per EMS, family member stated pt has a " full blown" seizure that lasted about 45 seconds this morning. Pt tripped and fell last night and injured left lower leg.
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
## 2011-11-10 NOTE — ED Notes (Signed)
cbg 194 by ems 

## 2011-11-10 NOTE — ED Notes (Signed)
edp in with pt 

## 2011-11-10 NOTE — ED Notes (Signed)
CRITICAL VALUE ALERT  Critical value received:  Potassium 2.6  Date of notification:  10/31/11  Time of notification:  1343  Critical value read back: yes  Nurse who received alert:  Primitivo Gauze  MD notified (1st page):  1344  Time of first page:  1344  MD notified (2nd page):  Time of second page:  Responding MD:  Dr. Rubin Payor  Time MD responded:  1344

## 2012-01-26 ENCOUNTER — Emergency Department (HOSPITAL_COMMUNITY)
Admission: EM | Admit: 2012-01-26 | Discharge: 2012-01-26 | Disposition: A | Discharge status: Home / Self Care | Payer: Medicare Other | Attending: Emergency Medicine | Admitting: Emergency Medicine

## 2012-01-26 ENCOUNTER — Encounter (HOSPITAL_COMMUNITY): Payer: Self-pay | Admitting: *Deleted

## 2012-01-26 DIAGNOSIS — F17200 Nicotine dependence, unspecified, uncomplicated: Secondary | ICD-10-CM | POA: Insufficient documentation

## 2012-01-26 DIAGNOSIS — H543 Unqualified visual loss, both eyes: Secondary | ICD-10-CM | POA: Insufficient documentation

## 2012-01-26 DIAGNOSIS — G610 Guillain-Barre syndrome: Secondary | ICD-10-CM | POA: Insufficient documentation

## 2012-01-26 DIAGNOSIS — M129 Arthropathy, unspecified: Secondary | ICD-10-CM | POA: Insufficient documentation

## 2012-01-26 DIAGNOSIS — M25476 Effusion, unspecified foot: Secondary | ICD-10-CM | POA: Insufficient documentation

## 2012-01-26 DIAGNOSIS — M25579 Pain in unspecified ankle and joints of unspecified foot: Secondary | ICD-10-CM

## 2012-01-26 DIAGNOSIS — Z79899 Other long term (current) drug therapy: Secondary | ICD-10-CM | POA: Insufficient documentation

## 2012-01-26 DIAGNOSIS — M25473 Effusion, unspecified ankle: Secondary | ICD-10-CM | POA: Insufficient documentation

## 2012-01-26 MED ORDER — HYDROMORPHONE HCL PF 1 MG/ML IJ SOLN
2.0000 mg | Freq: Once | INTRAMUSCULAR | Status: AC
  Administered 2012-01-26: 2 mg via INTRAMUSCULAR
  Filled 2012-01-26: qty 2

## 2012-01-26 MED ORDER — HYDROMORPHONE HCL PF 1 MG/ML IJ SOLN: 1.0000 mg | Freq: Once | INTRAMUSCULAR | Status: DC
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
## 2012-01-26 NOTE — ED Provider Notes (Signed)
History     CSN: [id number]  Arrival date & time 01/26/12  1121   First MD Initiated Contact with Patient 01/26/12 1148      Chief Complaint  Patient presents with  . Leg Pain    (Consider location/radiation/quality/duration/timing/severity/associated sxs/prior treatment) HPI Comments: Patient with a history of a previous fracture to her proximal left fibula and left ankle that occurred July of this year complains of worsening pain for several days. She states she is currently being followed for this injury by Dr. Hilda Lias and has an appointment with him in 3 days. She states she comes to the emergency room because of increased pain.  Patient has been wearing a cam walker, but states her ankle hurts worse when she wears it.   She denies recent injury, erythema, or any open wounds. 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  Pain to her left ankle is worse with weightbearing and improves somewhat with rest.    The history is provided by the patient.    Past Medical History  Diagnosis Date  . Guillain-Barre   . Blind   . Neuropathy   . Thyroid disease   . Back pain   . Arthritis   . Seizures     Past Surgical History  Procedure Date  . Cholecystectomy   . Tubal ligation   . Femur fracture surgery   . Ankle surgery   . Back surgery     No family history on file.  History  Substance Use Topics  . Smoking status: Current Every Day Smoker -- 0.5 packs/day    Types: Cigarettes  . Smokeless tobacco: Not on file  . Alcohol Use: No    OB History    Grav Para Term Preterm Abortions TAB SAB Ect Mult Living                  Review of Systems  Constitutional: Negative for fever and chills.  Genitourinary: Negative for dysuria and difficulty urinating.  Musculoskeletal: Positive for joint swelling and arthralgias.  Skin: Negative for color change and wound.  All other systems reviewed and are  negative.    Allergies  Neurontin; Acetaminophen; Eggs or egg-derived products; and Codeine  Home Medications   Current Outpatient Rx  Name Route Sig Dispense Refill  . CYANOCOBALAMIN 1000 MCG/ML IJ SOLN Intramuscular Inject 1,000 mcg into the muscle as directed. On Tuesday & Friday    . DICLOFENAC SODIUM 1 % TD GEL Topical Apply 1 application topically 4 (four) times daily as needed. For neuropathy pains    . FENTANYL 100 MCG/HR TD PT72 Transdermal Place 1 patch onto the skin every other day.     . IBUPROFEN 200 MG PO TABS Oral Take 200-600 mg by mouth every 6 (six) hours as needed. For pain    . LEVETIRACETAM 750 MG PO TABS Oral Take 750 mg by mouth 2 (two) times daily.    Marland Kitchen LEVOTHYROXINE SODIUM 50 MCG PO TABS Oral Take 50 mcg by mouth every morning.    Marland Kitchen POTASSIUM CHLORIDE ER 10 MEQ PO TBCR Oral Take 2 tablets (20 mEq total) by mouth 2 (two) times daily. 10 tablet 0  . PREGABALIN 100 MG PO CAPS Oral Take 100 mg by mouth 3 (three) times daily.     Marland Kitchen PROMETHAZINE HCL 25 MG PO TABS Oral Take 25 mg by mouth every 6 (six) hours as needed. For nausea  BP 151/104  Pulse 77  Temp 98.6 F (37 C) (Oral)  Resp 20  Ht 5\' 4"  (1.626 m)  Wt 150 lb (68.04 kg)  BMI 25.75 kg/m2  SpO2 97%  Physical Exam  Nursing note and vitals reviewed. Constitutional: She is oriented to person, place, and time. She appears well-developed and well-nourished. No distress.  HENT:  Head: Normocephalic and atraumatic.  Cardiovascular: Normal rate, regular rhythm, normal heart sounds and intact distal pulses.   Pulmonary/Chest: Effort normal and breath sounds normal.  Musculoskeletal: She exhibits tenderness.       Left ankle: She exhibits decreased range of motion and swelling. She exhibits no ecchymosis, no deformity, no laceration and normal pulse. tenderness. Lateral malleolus and medial malleolus tenderness found. No head of 5th metatarsal tenderness found. Achilles tendon normal.       Feet:        Left proximal and distal ankle is ttp, mild STS is present.  Well-healed surgical scar is present to the medial aspect of the ankle.  DP pulse is brisk, distal sensation decreased, but grossly intact.  No erythema, abrasion, or bruising.  No posterior calf pain erythema or edema.  Neurological: She is alert and oriented to person, place, and time. She exhibits normal muscle tone. Coordination normal.  Skin: Skin is warm and dry.    ED Course  Procedures (including critical care time)  Labs Reviewed - No data to display No results found.   Aircast applied by the nursing staff, pain improved, remains neurovascularly intact.   MDM    Consulted Dr. Hilda Lias.  Pt has appt to see him on Thursday.  Will place her in a air cast.  Pt agrees to keep her scheduled appt.  No calf pain , erythema or edema.  Mild to moderate STS of the left ankle.  Decreased sensation which she states is chronic, CR> 3 sec.     Patient is feeling better. Has Duragesic patches for chronic pain.         Brandy Caraveo L. [name], [place] 01/26/12 1756

## 2012-01-28 NOTE — ED Provider Notes (Signed)
Medical screening examination/treatment/procedure(s) were performed by non-physician practitioner and as supervising physician I was immediately available for consultation/collaboration.   [name], MD 01/28/12 [phone]

## 2012-04-23 DIAGNOSIS — I519 Heart disease, unspecified: Secondary | ICD-10-CM

## 2012-04-23 HISTORY — DX: Heart disease, unspecified: I51.9

## 2012-04-25 ENCOUNTER — Emergency Department (HOSPITAL_COMMUNITY)
Admission: EM | Admit: 2012-04-25 | Discharge: 2012-04-25 | Disposition: A | Discharge status: Home / Self Care | Payer: Medicare Other | Source: Home / Self Care | Attending: Emergency Medicine | Admitting: Emergency Medicine

## 2012-04-25 ENCOUNTER — Encounter (HOSPITAL_COMMUNITY): Payer: Self-pay

## 2012-04-25 DIAGNOSIS — Z791 Long term (current) use of non-steroidal anti-inflammatories (NSAID): Secondary | ICD-10-CM | POA: Insufficient documentation

## 2012-04-25 DIAGNOSIS — G40909 Epilepsy, unspecified, not intractable, without status epilepticus: Secondary | ICD-10-CM | POA: Insufficient documentation

## 2012-04-25 DIAGNOSIS — G610 Guillain-Barre syndrome: Secondary | ICD-10-CM | POA: Insufficient documentation

## 2012-04-25 DIAGNOSIS — B349 Viral infection, unspecified: Secondary | ICD-10-CM

## 2012-04-25 DIAGNOSIS — B9789 Other viral agents as the cause of diseases classified elsewhere: Secondary | ICD-10-CM | POA: Insufficient documentation

## 2012-04-25 DIAGNOSIS — F411 Generalized anxiety disorder: Secondary | ICD-10-CM | POA: Insufficient documentation

## 2012-04-25 DIAGNOSIS — Z79899 Other long term (current) drug therapy: Secondary | ICD-10-CM | POA: Insufficient documentation

## 2012-04-25 DIAGNOSIS — F3289 Other specified depressive episodes: Secondary | ICD-10-CM | POA: Insufficient documentation

## 2012-04-25 DIAGNOSIS — Z87440 Personal history of urinary (tract) infections: Secondary | ICD-10-CM | POA: Insufficient documentation

## 2012-04-25 DIAGNOSIS — F17200 Nicotine dependence, unspecified, uncomplicated: Secondary | ICD-10-CM | POA: Insufficient documentation

## 2012-04-25 DIAGNOSIS — H543 Unqualified visual loss, both eyes: Secondary | ICD-10-CM | POA: Insufficient documentation

## 2012-04-25 DIAGNOSIS — F329 Major depressive disorder, single episode, unspecified: Secondary | ICD-10-CM | POA: Insufficient documentation

## 2012-04-25 DIAGNOSIS — Z8739 Personal history of other diseases of the musculoskeletal system and connective tissue: Secondary | ICD-10-CM | POA: Insufficient documentation

## 2012-04-25 DIAGNOSIS — M129 Arthropathy, unspecified: Secondary | ICD-10-CM | POA: Insufficient documentation

## 2012-04-25 DIAGNOSIS — R110 Nausea: Secondary | ICD-10-CM | POA: Insufficient documentation

## 2012-04-25 DIAGNOSIS — E079 Disorder of thyroid, unspecified: Secondary | ICD-10-CM | POA: Insufficient documentation

## 2012-04-25 DIAGNOSIS — G589 Mononeuropathy, unspecified: Secondary | ICD-10-CM | POA: Insufficient documentation

## 2012-04-25 DIAGNOSIS — E876 Hypokalemia: Secondary | ICD-10-CM | POA: Insufficient documentation

## 2012-04-25 HISTORY — DX: Anxiety disorder, unspecified: F41.9

## 2012-04-25 HISTORY — DX: Major depressive disorder, single episode, unspecified: F32.9

## 2012-04-25 HISTORY — DX: Depression, unspecified: F32.A

## 2012-04-25 HISTORY — DX: Urinary tract infection, site not specified: N39.0

## 2012-04-25 LAB — BASIC METABOLIC PANEL
BUN: 5 mg/dL — ABNORMAL LOW (ref 6–23)
Chloride: 105 mEq/L (ref 96–112)
Creatinine, Ser: 0.52 mg/dL (ref 0.50–1.10)
GFR calc Af Amer: >90 mL/min (ref >90)
GFR calc non Af Amer: >90 mL/min (ref >90)

## 2012-04-25 LAB — CBC WITH DIFFERENTIAL/PLATELET
Basophils Absolute: 0.1 10*3/uL (ref 0.0–0.1)
Lymphs Abs: 2.2 10*3/uL (ref 0.7–4.0)
MCH: 40.2 pg — ABNORMAL HIGH (ref 26.0–34.0)
MCHC: 35.1 g/dL (ref 30.0–36.0)
MCV: 114.6 fL — ABNORMAL HIGH (ref 78.0–100.0)
Monocytes Absolute: 0.4 10*3/uL (ref 0.1–1.0)
Platelets: 141 10*3/uL — ABNORMAL LOW (ref 150–400)
RDW: 13.5 % (ref 11.5–15.5)

## 2012-04-25 LAB — URINALYSIS, ROUTINE W REFLEX MICROSCOPIC
Bilirubin Urine: NEGATIVE
Protein, ur: NEGATIVE mg/dL
Urobilinogen, UA: 0.2 mg/dL (ref 0.0–1.0)

## 2012-04-25 MED ORDER — ONDANSETRON HCL 4 MG/2ML IJ SOLN
4.0000 mg | Freq: Once | INTRAMUSCULAR | Status: AC
  Administered 2012-04-25: 4 mg via INTRAVENOUS
  Filled 2012-04-25: qty 2

## 2012-04-25 MED ORDER — POTASSIUM CHLORIDE CRYS ER 20 MEQ PO TBCR: 20.0000 meq | EXTENDED_RELEASE_TABLET | Freq: Two times a day (BID) | ORAL | Status: DC

## 2012-04-25 MED ORDER — SODIUM CHLORIDE 0.9 % IV BOLUS (SEPSIS)
1000.0000 mL | Freq: Once | INTRAVENOUS | Status: AC
  Administered 2012-04-25: 1000 mL via INTRAVENOUS

## 2012-04-25 MED ORDER — POTASSIUM CHLORIDE CRYS ER 20 MEQ PO TBCR
40.0000 meq | EXTENDED_RELEASE_TABLET | Freq: Once | ORAL | Status: AC
  Administered 2012-04-25: 40 meq via ORAL
  Filled 2012-04-25: qty 2

## 2012-04-25 MED ORDER — HYDROMORPHONE HCL PF 1 MG/ML IJ SOLN
1.0000 mg | Freq: Once | INTRAMUSCULAR | Status: AC
  Administered 2012-04-25: 1 mg via INTRAVENOUS
  Filled 2012-04-25: qty 1

## 2012-04-25 MED ORDER — KETOROLAC TROMETHAMINE 30 MG/ML IJ SOLN
30.0000 mg | Freq: Once | INTRAMUSCULAR | Status: AC
  Administered 2012-04-25: 30 mg via INTRAVENOUS
  Filled 2012-04-25: qty 1

## 2012-04-25 NOTE — ED Notes (Signed)
Pt resting at this time, breathing even and unlabored.

## 2012-04-25 NOTE — ED Notes (Signed)
Discharge instructions reviewed with pt, questions answered. Pt verbalized understanding.  

## 2012-04-25 NOTE — ED Notes (Signed)
Patient given water per RN approval. 

## 2012-04-25 NOTE — ED Provider Notes (Signed)
History    This chart was scribed for Donnetta Hutching, MD by Marlin Canary. The patient was seen in room APA02/APA02. Patient's care was started at 1136.  CSN: [id number]  Arrival date & time 04/25/12  1115   First MD Initiated Contact with Patient 04/25/12 1136      Chief Complaint  Patient presents with  . Shortness of Breath    (Consider location/radiation/quality/duration/timing/severity/associated sxs/prior treatment) The history is provided by the EMS personnel. No language interpreter was used.   [name] is a 52 y.o. female   Complains of weakness, achiness, nausea.  No complaints of shortness of breath or chest pain. has been taking fluids.  No fevers, sweats, chills.  Nothing makes symptoms better or worse. Severity is mild to moderate.      Past Medical History  Diagnosis Date  . Guillain-Barre   . Blind   . Neuropathy   . Thyroid disease   . Back pain   . Arthritis   . Seizures   . UTI (lower urinary tract infection)   . Anxiety   . Depression     Past Surgical History  Procedure Date  . Cholecystectomy   . Tubal ligation   . Femur fracture surgery   . Ankle surgery   . Back surgery     No family history on file.  History  Substance Use Topics  . Smoking status: Current Every Day Smoker -- 0.5 packs/day    Types: Cigarettes  . Smokeless tobacco: Not on file  . Alcohol Use: No    OB History    Grav Para Term Preterm Abortions TAB SAB Ect Mult Living                  Review of Systems  Respiratory: Positive for shortness of breath.   Musculoskeletal: Positive for myalgias.  All other systems reviewed and are negative.   A complete 10 system review of systems was obtained and all systems are negative except as noted in the HPI and PMH.   Allergies  Neurontin; Acetaminophen; Eggs or egg-derived products; and Codeine  Home Medications   Current Outpatient Rx  Name  Route  Sig  Dispense  Refill  . CYANOCOBALAMIN 1000 MCG/ML IJ  SOLN   Intramuscular   Inject 1,000 mcg into the muscle as directed. On Tuesday & Friday         . DICLOFENAC SODIUM 1 % TD GEL   Topical   Apply 1 application topically 4 (four) times daily as needed. For neuropathy pains         . FENTANYL 100 MCG/HR TD PT72   Transdermal   Place 1 patch onto the skin every other day.          . IBUPROFEN 200 MG PO TABS   Oral   Take 200-600 mg by mouth every 6 (six) hours as needed. For pain         . LEVETIRACETAM 750 MG PO TABS   Oral   Take 750 mg by mouth 2 (two) times daily.         Marland Kitchen LEVOTHYROXINE SODIUM 50 MCG PO TABS   Oral   Take 50 mcg by mouth every morning.         Marland Kitchen POTASSIUM CHLORIDE ER 10 MEQ PO TBCR   Oral   Take 2 tablets (20 mEq total) by mouth 2 (two) times daily.   10 tablet   0   . PREGABALIN 100 MG PO  CAPS   Oral   Take 100 mg by mouth 3 (three) times daily.          Marland Kitchen PROMETHAZINE HCL 25 MG PO TABS   Oral   Take 25 mg by mouth every 6 (six) hours as needed. For nausea           BP 145/98  Pulse 88  Temp 98.1 F (36.7 C) (Oral)  Resp 20  Wt 200 lb (90.719 kg)  SpO2 98%  Physical Exam  Nursing note and vitals reviewed. Constitutional: She is oriented to person, place, and time. She appears well-developed and well-nourished.       Sluggish, but moves all extremities.  HENT:  Head: Normocephalic and atraumatic.  Eyes: Conjunctivae normal and EOM are normal. Pupils are equal, round, and reactive to light.  Neck: Normal range of motion. Neck supple.       No stiff neck  Cardiovascular: Normal rate, regular rhythm and normal heart sounds.   Pulmonary/Chest: Effort normal and breath sounds normal.  Abdominal: Soft. Bowel sounds are normal.  Musculoskeletal: Normal range of motion.  Neurological: She is alert and oriented to person, place, and time.  Skin: Skin is warm and dry.  Psychiatric: She has a normal mood and affect.    ED Course  Procedures (including critical care  time)  DIAGNOSTIC STUDIES: Oxygen Saturation is 98% on room air, Normal by my interpretation.    COORDINATION OF CARE:  1136-Ordered IV fluids and pain medication. Patient informed of current plan for treatment and evaluation and agrees with plan at this time.   Labs Reviewed  URINALYSIS, ROUTINE W REFLEX MICROSCOPIC - Abnormal; Notable for the following:    Specific Gravity, Urine <1.005 (*)     Hgb urine dipstick TRACE (*)     Leukocytes, UA SMALL (*)     All other components within normal limits  CBC WITH DIFFERENTIAL - Abnormal; Notable for the following:    RBC 3.16 (*)     MCV 114.6 (*)     MCH 40.2 (*)     Platelets 141 (*)     All other components within normal limits  BASIC METABOLIC PANEL - Abnormal; Notable for the following:    Sodium 147 (*)     Potassium 2.8 (*)     BUN 5 (*)     All other components within normal limits  URINE MICROSCOPIC-ADD ON   No results found.   No diagnosis found.    MDM  Hypokalemia noted.   Will replace.  Feels better after IV fluids. Has primary care followup.  No complaints of shortness of breath or chest pain with examiner. History and physical consistent with viral syndrome.    I personally performed the services described in this documentation, which was scribed in my presence. The recorded information has been reviewed and is accurate.        Donnetta Hutching, MD 04/25/12 [phone]

## 2012-04-25 NOTE — ED Notes (Signed)
Pt arrived from home by ems.  Sob for 1 week, has been sick w/ flu.  Combative w/ ems at arrival. Refused to answer simple ?'s

## 2012-04-25 NOTE — ED Notes (Signed)
Patient is upset and wanting her husband.

## 2012-04-28 ENCOUNTER — Encounter (HOSPITAL_COMMUNITY): Payer: Self-pay | Admitting: *Deleted

## 2012-04-28 ENCOUNTER — Inpatient Hospital Stay (HOSPITAL_COMMUNITY)
Admission: EM | Admit: 2012-04-28 | Discharge: 2012-05-01 | DRG: 690 | Disposition: A | Discharge status: Home / Self Care | Payer: Medicare Other | Attending: Internal Medicine | Admitting: Internal Medicine

## 2012-04-28 ENCOUNTER — Emergency Department (HOSPITAL_COMMUNITY): Payer: Medicare Other

## 2012-04-28 ENCOUNTER — Inpatient Hospital Stay (HOSPITAL_COMMUNITY): Payer: Medicare Other

## 2012-04-28 DIAGNOSIS — M129 Arthropathy, unspecified: Secondary | ICD-10-CM | POA: Diagnosis present

## 2012-04-28 DIAGNOSIS — Z765 Malingerer [conscious simulation]: Secondary | ICD-10-CM

## 2012-04-28 DIAGNOSIS — D7589 Other specified diseases of blood and blood-forming organs: Secondary | ICD-10-CM | POA: Diagnosis present

## 2012-04-28 DIAGNOSIS — R197 Diarrhea, unspecified: Secondary | ICD-10-CM

## 2012-04-28 DIAGNOSIS — D696 Thrombocytopenia, unspecified: Secondary | ICD-10-CM | POA: Diagnosis present

## 2012-04-28 DIAGNOSIS — R21 Rash and other nonspecific skin eruption: Secondary | ICD-10-CM | POA: Diagnosis present

## 2012-04-28 DIAGNOSIS — N200 Calculus of kidney: Secondary | ICD-10-CM

## 2012-04-28 DIAGNOSIS — R000 Tachycardia, unspecified: Secondary | ICD-10-CM | POA: Diagnosis present

## 2012-04-28 DIAGNOSIS — R809 Proteinuria, unspecified: Secondary | ICD-10-CM

## 2012-04-28 DIAGNOSIS — R0902 Hypoxemia: Secondary | ICD-10-CM | POA: Diagnosis present

## 2012-04-28 DIAGNOSIS — F3289 Other specified depressive episodes: Secondary | ICD-10-CM | POA: Diagnosis present

## 2012-04-28 DIAGNOSIS — G40909 Epilepsy, unspecified, not intractable, without status epilepticus: Secondary | ICD-10-CM

## 2012-04-28 DIAGNOSIS — G589 Mononeuropathy, unspecified: Secondary | ICD-10-CM | POA: Diagnosis present

## 2012-04-28 DIAGNOSIS — F1910 Other psychoactive substance abuse, uncomplicated: Secondary | ICD-10-CM | POA: Diagnosis present

## 2012-04-28 DIAGNOSIS — R739 Hyperglycemia, unspecified: Secondary | ICD-10-CM

## 2012-04-28 DIAGNOSIS — A498 Other bacterial infections of unspecified site: Secondary | ICD-10-CM | POA: Diagnosis present

## 2012-04-28 DIAGNOSIS — F411 Generalized anxiety disorder: Secondary | ICD-10-CM | POA: Diagnosis present

## 2012-04-28 DIAGNOSIS — E538 Deficiency of other specified B group vitamins: Secondary | ICD-10-CM

## 2012-04-28 DIAGNOSIS — F329 Major depressive disorder, single episode, unspecified: Secondary | ICD-10-CM | POA: Diagnosis present

## 2012-04-28 DIAGNOSIS — M5137 Other intervertebral disc degeneration, lumbosacral region: Secondary | ICD-10-CM | POA: Diagnosis present

## 2012-04-28 DIAGNOSIS — M51379 Other intervertebral disc degeneration, lumbosacral region without mention of lumbar back pain or lower extremity pain: Secondary | ICD-10-CM | POA: Diagnosis present

## 2012-04-28 DIAGNOSIS — F10239 Alcohol dependence with withdrawal, unspecified: Secondary | ICD-10-CM

## 2012-04-28 DIAGNOSIS — N390 Urinary tract infection, site not specified: Principal | ICD-10-CM | POA: Diagnosis present

## 2012-04-28 DIAGNOSIS — R319 Hematuria, unspecified: Secondary | ICD-10-CM | POA: Diagnosis present

## 2012-04-28 DIAGNOSIS — R7989 Other specified abnormal findings of blood chemistry: Secondary | ICD-10-CM | POA: Diagnosis present

## 2012-04-28 DIAGNOSIS — Z87898 Personal history of other specified conditions: Secondary | ICD-10-CM

## 2012-04-28 DIAGNOSIS — F10939 Alcohol use, unspecified with withdrawal, unspecified: Secondary | ICD-10-CM | POA: Diagnosis present

## 2012-04-28 DIAGNOSIS — R112 Nausea with vomiting, unspecified: Secondary | ICD-10-CM

## 2012-04-28 DIAGNOSIS — F1010 Alcohol abuse, uncomplicated: Secondary | ICD-10-CM

## 2012-04-28 DIAGNOSIS — Z79899 Other long term (current) drug therapy: Secondary | ICD-10-CM

## 2012-04-28 DIAGNOSIS — E860 Dehydration: Secondary | ICD-10-CM | POA: Diagnosis present

## 2012-04-28 DIAGNOSIS — R945 Abnormal results of liver function studies: Secondary | ICD-10-CM

## 2012-04-28 DIAGNOSIS — R109 Unspecified abdominal pain: Secondary | ICD-10-CM

## 2012-04-28 DIAGNOSIS — F17200 Nicotine dependence, unspecified, uncomplicated: Secondary | ICD-10-CM | POA: Diagnosis present

## 2012-04-28 DIAGNOSIS — E876 Hypokalemia: Secondary | ICD-10-CM | POA: Diagnosis present

## 2012-04-28 DIAGNOSIS — W19XXXA Unspecified fall, initial encounter: Secondary | ICD-10-CM

## 2012-04-28 DIAGNOSIS — E039 Hypothyroidism, unspecified: Secondary | ICD-10-CM

## 2012-04-28 HISTORY — DX: Other intervertebral disc degeneration, lumbar region: M51.36

## 2012-04-28 HISTORY — DX: Other intervertebral disc degeneration, lumbar region without mention of lumbar back pain or lower extremity pain: M51.369

## 2012-04-28 LAB — CBC
HCT: 37.3 % (ref 36.0–46.0)
Platelets: 125 10*3/uL — ABNORMAL LOW (ref 150–400)
RDW: 13.3 % (ref 11.5–15.5)
WBC: 9.7 10*3/uL (ref 4.0–10.5)

## 2012-04-28 LAB — CBC WITH DIFFERENTIAL/PLATELET
Basophils Absolute: 0 10*3/uL (ref 0.0–0.1)
Eosinophils Absolute: 0 10*3/uL (ref 0.0–0.7)
HCT: 37.3 % (ref 36.0–46.0)
Lymphocytes Relative: 14 % (ref 12–46)
MCHC: 34.6 g/dL (ref 30.0–36.0)
Monocytes Absolute: 0.6 10*3/uL (ref 0.1–1.0)
Neutro Abs: 7.7 10*3/uL (ref 1.7–7.7)
RDW: 13.3 % (ref 11.5–15.5)

## 2012-04-28 LAB — URINALYSIS, ROUTINE W REFLEX MICROSCOPIC
Glucose, UA: 100 mg/dL — AB
Specific Gravity, Urine: >1.03 — ABNORMAL HIGH (ref 1.005–1.030)
pH: 6.5 (ref 5.0–8.0)

## 2012-04-28 LAB — CREATININE, SERUM
Creatinine, Ser: 0.53 mg/dL (ref 0.50–1.10)
GFR calc Af Amer: >90 mL/min (ref >90)
GFR calc non Af Amer: >90 mL/min (ref >90)

## 2012-04-28 LAB — COMPREHENSIVE METABOLIC PANEL
AST: 44 U/L — ABNORMAL HIGH (ref 0–37)
Albumin: 3.9 g/dL (ref 3.5–5.2)
Calcium: 9.2 mg/dL (ref 8.4–10.5)
Chloride: 93 mEq/L — ABNORMAL LOW (ref 96–112)
Creatinine, Ser: 0.5 mg/dL (ref 0.50–1.10)
Total Protein: 8 g/dL (ref 6.0–8.3)

## 2012-04-28 LAB — MAGNESIUM: Magnesium: 1.2 mg/dL — ABNORMAL LOW (ref 1.5–2.5)

## 2012-04-28 LAB — ETHANOL: Alcohol, Ethyl (B): <11 mg/dL (ref 0–11)

## 2012-04-28 LAB — RETICULOCYTES: Retic Count, Absolute: 32 10*3/uL (ref 19.0–186.0)

## 2012-04-28 LAB — URINE MICROSCOPIC-ADD ON

## 2012-04-28 LAB — PHOSPHORUS: Phosphorus: 2.3 mg/dL (ref 2.3–4.6)

## 2012-04-28 MED ORDER — ALBUTEROL SULFATE (5 MG/ML) 0.5% IN NEBU: 2.5000 mg | INHALATION_SOLUTION | RESPIRATORY_TRACT | Status: DC | PRN

## 2012-04-28 MED ORDER — MORPHINE SULFATE 4 MG/ML IJ SOLN
4.0000 mg | INTRAMUSCULAR | Status: DC | PRN
  Administered 2012-04-28 – 2012-04-29 (×4): 4 mg via INTRAVENOUS
  Filled 2012-04-28 (×4): qty 1

## 2012-04-28 MED ORDER — POTASSIUM CHLORIDE 10 MEQ/100ML IV SOLN
10.0000 meq | Freq: Once | INTRAVENOUS | Status: AC
  Administered 2012-04-28: 10 meq via INTRAVENOUS
  Filled 2012-04-28: qty 100

## 2012-04-28 MED ORDER — THIAMINE HCL 100 MG/ML IJ SOLN
Freq: Once | INTRAVENOUS | Status: AC
  Administered 2012-04-28: 23:00:00 via INTRAVENOUS
  Filled 2012-04-28: qty 1000

## 2012-04-28 MED ORDER — ALUM & MAG HYDROXIDE-SIMETH 200-200-20 MG/5ML PO SUSP: 30.0000 mL | Freq: Four times a day (QID) | ORAL | Status: DC | PRN

## 2012-04-28 MED ORDER — SODIUM CHLORIDE 0.9 % IV SOLN
INTRAVENOUS | Status: DC
  Administered 2012-04-28: 18:00:00 via INTRAVENOUS

## 2012-04-28 MED ORDER — ENOXAPARIN SODIUM 40 MG/0.4ML ~~LOC~~ SOLN
40.0000 mg | SUBCUTANEOUS | Status: DC
  Administered 2012-04-28 – 2012-04-30 (×3): 40 mg via SUBCUTANEOUS
  Filled 2012-04-28 (×3): qty 0.4

## 2012-04-28 MED ORDER — PREGABALIN 75 MG PO CAPS
100.0000 mg | ORAL_CAPSULE | Freq: Three times a day (TID) | ORAL | Status: DC
  Administered 2012-04-29 – 2012-05-01 (×6): 100 mg via ORAL
  Filled 2012-04-28 (×4): qty 2
  Filled 2012-04-28: qty 1
  Filled 2012-04-28: qty 2
  Filled 2012-04-28: qty 1
  Filled 2012-04-28: qty 2

## 2012-04-28 MED ORDER — MORPHINE SULFATE 4 MG/ML IJ SOLN
4.0000 mg | INTRAMUSCULAR | Status: DC | PRN
  Administered 2012-04-28: 4 mg via INTRAVENOUS
  Filled 2012-04-28: qty 1

## 2012-04-28 MED ORDER — IOHEXOL 300 MG/ML  SOLN
50.0000 mL | Freq: Once | INTRAMUSCULAR | Status: AC | PRN
  Administered 2012-04-28: 50 mL via ORAL

## 2012-04-28 MED ORDER — POTASSIUM CHLORIDE 10 MEQ/100ML IV SOLN
10.0000 meq | INTRAVENOUS | Status: AC
  Administered 2012-04-28 – 2012-04-29 (×4): 10 meq via INTRAVENOUS
  Filled 2012-04-28 (×4): qty 100

## 2012-04-28 MED ORDER — THIAMINE HCL 100 MG/ML IJ SOLN
INTRAMUSCULAR | Status: AC
  Filled 2012-04-28: qty 2

## 2012-04-28 MED ORDER — FENTANYL CITRATE 0.05 MG/ML IJ SOLN
100.0000 ug | INTRAMUSCULAR | Status: DC | PRN
  Administered 2012-04-28: 100 ug via INTRAVENOUS
  Filled 2012-04-28: qty 2

## 2012-04-28 MED ORDER — SODIUM CHLORIDE 0.9 % IJ SOLN
3.0000 mL | Freq: Two times a day (BID) | INTRAMUSCULAR | Status: DC
  Administered 2012-04-29 – 2012-04-30 (×2): 3 mL via INTRAVENOUS

## 2012-04-28 MED ORDER — PROMETHAZINE HCL 25 MG/ML IJ SOLN
12.5000 mg | Freq: Four times a day (QID) | INTRAMUSCULAR | Status: DC | PRN
  Administered 2012-04-28 – 2012-04-29 (×4): 12.5 mg via INTRAVENOUS
  Filled 2012-04-28 (×4): qty 1

## 2012-04-28 MED ORDER — ASPIRIN EC 81 MG PO TBEC
81.0000 mg | DELAYED_RELEASE_TABLET | Freq: Every day | ORAL | Status: DC
  Administered 2012-04-29 – 2012-05-01 (×3): 81 mg via ORAL
  Filled 2012-04-28 (×3): qty 1

## 2012-04-28 MED ORDER — FOLIC ACID 5 MG/ML IJ SOLN
INTRAMUSCULAR | Status: AC
  Filled 2012-04-28: qty 0.2

## 2012-04-28 MED ORDER — M.V.I. ADULT IV INJ
INJECTION | INTRAVENOUS | Status: AC
  Filled 2012-04-28: qty 10

## 2012-04-28 MED ORDER — ONDANSETRON HCL 4 MG/2ML IJ SOLN
4.0000 mg | INTRAMUSCULAR | Status: DC | PRN
  Administered 2012-04-28: 4 mg via INTRAVENOUS
  Filled 2012-04-28: qty 2

## 2012-04-28 MED ORDER — SODIUM CHLORIDE 0.9 % IV BOLUS (SEPSIS)
500.0000 mL | Freq: Once | INTRAVENOUS | Status: AC
  Administered 2012-04-28: 18:00:00 via INTRAVENOUS

## 2012-04-28 MED ORDER — LEVOTHYROXINE SODIUM 50 MCG PO TABS
50.0000 ug | ORAL_TABLET | Freq: Every day | ORAL | Status: DC
  Administered 2012-04-29 – 2012-04-30 (×2): 50 ug via ORAL
  Filled 2012-04-28 (×2): qty 1

## 2012-04-28 NOTE — ED Notes (Signed)
When pt was asked if she was wearing her fentanyl pt she stated that she called her pmd on Monday and told him that  It was too strong for her and she stopped using it. Denies patch on her body at this time.

## 2012-04-28 NOTE — ED Provider Notes (Signed)
History     CSN: [id number]  Arrival date & time 04/28/12  1520   First MD Initiated Contact with Patient 04/28/12 1701      Chief Complaint  Patient presents with  . Emesis     HPI Pt was seen at 1715.  Per pt, c/o gradual onset and persistence of multiple intermittent episodes of N/V/D for the 6 days.  Describes the stools as "watery."  Has been associated with generalized body aches/fatigue, generalized abd pain, and decreased PO intake.  Describes the abd pain as "cramping."  Denies CP/SOB, no back pain, no fevers, no black or blood in stools or emesis. Pt states she was eval in the ED for same 3 days ago for same and "doesn't feel any better."     Past Medical History  Diagnosis Date  . Guillain-Barre   . Blind   . Neuropathy   . Thyroid disease   . Back pain   . Arthritis   . Seizures   . UTI (lower urinary tract infection)   . Anxiety   . Depression     Past Surgical History  Procedure Date  . Cholecystectomy   . Tubal ligation   . Femur fracture surgery   . Ankle surgery   . Back surgery     History  Substance Use Topics  . Smoking status: Current Every Day Smoker -- 0.5 packs/day    Types: Cigarettes  . Smokeless tobacco: Not on file  . Alcohol Use: No      Review of Systems ROS: Statement: All systems negative except as marked or noted in the HPI; Constitutional: Negative for fever and chills. +generalized body aches/fatigue; ; Eyes: Negative for eye pain, redness and discharge. ; ; ENMT: Negative for ear pain, hoarseness, nasal congestion, sinus pressure and sore throat. ; ; Cardiovascular: Negative for chest pain, palpitations, diaphoresis, dyspnea and peripheral edema. ; ; Respiratory: Negative for cough, wheezing and stridor. ; ; Gastrointestinal: +N/V/D, abd pain. Negative for blood in stool, hematemesis, jaundice and rectal bleeding. . ; ; Genitourinary: Negative for dysuria, flank pain and hematuria. ; ; Musculoskeletal: Negative for back pain and  neck pain. Negative for swelling and trauma.; ; Skin: Negative for pruritus, rash, abrasions, blisters, bruising and skin lesion.; ; Neuro: Negative for headache, lightheadedness and neck stiffness. Negative for weakness, altered level of consciousness , altered mental status, extremity weakness, paresthesias, involuntary movement, seizure and syncope.       Allergies  Neurontin; Acetaminophen; Eggs or egg-derived products; and Codeine  Home Medications   Current Outpatient Rx  Name  Route  Sig  Dispense  Refill  . CYANOCOBALAMIN 1000 MCG/ML IJ SOLN   Intramuscular   Inject 1,000 mcg into the muscle once a week. Sunday         . DICLOFENAC SODIUM 1 % TD GEL   Topical   Apply 1 application topically 4 (four) times daily as needed. For neuropathy pains         . IBUPROFEN 200 MG PO TABS   Oral   Take 200 mg by mouth every 6 (six) hours as needed. For pain         . LEVOTHYROXINE SODIUM 50 MCG PO TABS   Oral   Take 50 mcg by mouth every morning.         Marland Kitchen POTASSIUM CHLORIDE CRYS ER 20 MEQ PO TBCR   Oral   Take 1 tablet (20 mEq total) by mouth 2 (two) times daily.  20 tablet   0   . PREGABALIN 100 MG PO CAPS   Oral   Take 100 mg by mouth 3 (three) times daily.            BP 120/91  Pulse 107  Temp 99.5 F (37.5 C) (Oral)  Resp 20  Ht 5\' 4"  (1.626 m)  Wt 170 lb (77.111 kg)  BMI 29.18 kg/m2  SpO2 99%  Physical Exam 1720: Physical examination:  Nursing notes reviewed; Vital signs and O2 SAT reviewed;  Constitutional: Well developed, Well nourished, In no acute distress; Head:  Normocephalic, atraumatic; Eyes: EOMI, PERRL, No scleral icterus; ENMT: Mouth and pharynx normal, Mucous membranes dry; Neck: Supple, Full range of motion, No lymphadenopathy; Cardiovascular: Regular rate and rhythm, No murmur, rub, or gallop; Respiratory: Breath sounds clear & equal bilaterally, No rales, rhonchi, wheezes.  Speaking full sentences with ease, Normal respiratory  effort/excursion; Chest: Nontender, Movement normal; Abdomen: Soft, +mild diffuse tenderness to palp. No rebound or guarding. Nondistended, Normal bowel sounds; Genitourinary: No CVA tenderness; Extremities: Pulses normal, No tenderness, No edema, No calf edema or asymmetry.; Neuro: AA&Ox3, Major CN grossly intact.  Speech clear. No gross focal motor or sensory deficits in extremities.; Skin: Color normal, Warm, Dry.   ED Course  Procedures    MDM  MDM Reviewed: nursing note, vitals and previous chart Reviewed previous: labs Interpretation: labs and x-ray   Results for orders placed during the hospital encounter of 04/28/12  CBC WITH DIFFERENTIAL      Component Value Range   WBC 9.7  4.0 - 10.5 K/uL   RBC 3.22 (*) 3.87 - 5.11 MIL/uL   Hemoglobin 12.9  12.0 - 15.0 g/dL   HCT 16.1  09.6 - 04.5 %   MCV 115.8 (*) 78.0 - 100.0 fL   MCH 40.1 (*) 26.0 - 34.0 pg   MCHC 34.6  30.0 - 36.0 g/dL   RDW 40.9  81.1 - 91.4 %   Platelets 123 (*) 150 - 400 K/uL   Neutrophils Relative 80 (*) 43 - 77 %   Lymphocytes Relative 14  12 - 46 %   Monocytes Relative 6  3 - 12 %   Eosinophils Relative 0  0 - 5 %   Basophils Relative 0  0 - 1 %   Neutro Abs 7.7  1.7 - 7.7 K/uL   Lymphs Abs 1.4  0.7 - 4.0 K/uL   Monocytes Absolute 0.6  0.1 - 1.0 K/uL   Eosinophils Absolute 0.0  0.0 - 0.7 K/uL   Basophils Absolute 0.0  0.0 - 0.1 K/uL  COMPREHENSIVE METABOLIC PANEL      Component Value Range   Sodium 142  135 - 145 mEq/L   Potassium 2.4 (*) 3.5 - 5.1 mEq/L   Chloride 93 (*) 96 - 112 mEq/L   CO2 36 (*) 19 - 32 mEq/L   Glucose, Bld 128 (*) 70 - 99 mg/dL   BUN 12  6 - 23 mg/dL   Creatinine, Ser 7.82  0.50 - 1.10 mg/dL   Calcium 9.2  8.4 - 95.6 mg/dL   Total Protein 8.0  6.0 - 8.3 g/dL   Albumin 3.9  3.5 - 5.2 g/dL   AST 44 (*) 0 - 37 U/L   ALT 23  0 - 35 U/L   Alkaline Phosphatase 303 (*) 39 - 117 U/L   Total Bilirubin 2.0 (*) 0.3 - 1.2 mg/dL   GFR calc non Af Amer >90  >90 mL/min  GFR calc Af  Amer >90  >90 mL/min  URINALYSIS, ROUTINE W REFLEX MICROSCOPIC      Component Value Range   Color, Urine AMBER (*) YELLOW   APPearance CLEAR  CLEAR   Specific Gravity, Urine >1.030 (*) 1.005 - 1.030   pH 6.5  5.0 - 8.0   Glucose, UA 100 (*) NEGATIVE mg/dL   Hgb urine dipstick LARGE (*) NEGATIVE   Bilirubin Urine MODERATE (*) NEGATIVE   Ketones, ur 15 (*) NEGATIVE mg/dL   Protein, ur >960 (*) NEGATIVE mg/dL   Urobilinogen, UA 2.0 (*) 0.0 - 1.0 mg/dL   Nitrite POSITIVE (*) NEGATIVE   Leukocytes, UA NEGATIVE  NEGATIVE  LIPASE, BLOOD      Component Value Range   Lipase 5 (*) 11 - 59 U/L  URINE MICROSCOPIC-ADD ON      Component Value Range   Squamous Epithelial / LPF FEW (*) RARE   WBC, UA 3-6  <3 WBC/hpf   RBC / HPF 11-20  <3 RBC/hpf   Bacteria, UA MANY (*) RARE   Dg Abd Acute W/chest 04/28/2012  *RADIOLOGY REPORT*  Clinical Data: Abdominal pain.  Nausea and vomiting.  ACUTE ABDOMEN SERIES (ABDOMEN 2 VIEW & CHEST 1 VIEW)  Comparison: One-view chest to 11/10/2011.  CT abdomen pelvis 10/01/2009.  Findings: The heart size is exaggerated by low lung volumes.  The lungs are clear.  Supine and upright views of the abdomen demonstrate nonspecific bowel gas pattern.  There is no evidence for obstruction or free air.  Surgical clips are present in the right upper quadrant. Phleboliths are noted in the anatomic pelvis.  Degenerative changes are present in the lower lumbar spine.  IMPRESSION:  1.  No acute abnormality of the chest or abdomen.   Original Report Authenticated By: Marin Roberts, M.D.     1930:  Pt has had small stool while in the ED.  Continues to c/o nausea and "pain all over" despite pain meds.  Abd continues without focal tenderness. VSS.  Climbs on and off stretcher to commode by herself without difficulty.  Potassium repleted IV.  IVF continues. Dx and testing d/w pt.  Questions answered.  Verb understanding, agreeable to admit.  T/C to [facility] Dr. Phillips Odor, case discussed,  including:  HPI, pertinent PM/SHx, VS/PE, dx testing, ED course and treatment:  Agreeable to admit, requests she will come to ED for eval.            Laray Anger, DO 04/29/12 1709

## 2012-04-28 NOTE — ED Notes (Signed)
Lab called and K is 2.4

## 2012-04-28 NOTE — ED Notes (Signed)
Pt reports being sick since Thursday of last week.  Flu like symptoms, was seen er for the same Sunday. Cont. To not feel weel.

## 2012-04-28 NOTE — ED Notes (Signed)
Patient up on bedside commode, tolerated well 

## 2012-04-28 NOTE — ED Notes (Signed)
Vomiting and diarrhea , aching all over,  Dysuria, with foul odor  And abd cramps.

## 2012-04-29 ENCOUNTER — Inpatient Hospital Stay (HOSPITAL_COMMUNITY): Payer: Medicare Other

## 2012-04-29 DIAGNOSIS — R7989 Other specified abnormal findings of blood chemistry: Secondary | ICD-10-CM

## 2012-04-29 DIAGNOSIS — E860 Dehydration: Secondary | ICD-10-CM

## 2012-04-29 LAB — COMPREHENSIVE METABOLIC PANEL
AST: 36 U/L (ref 0–37)
Albumin: 3.3 g/dL — ABNORMAL LOW (ref 3.5–5.2)
BUN: 15 mg/dL (ref 6–23)
Calcium: 7.8 mg/dL — ABNORMAL LOW (ref 8.4–10.5)
Chloride: 96 mEq/L (ref 96–112)
Creatinine, Ser: 0.44 mg/dL — ABNORMAL LOW (ref 0.50–1.10)
Total Protein: 6.8 g/dL (ref 6.0–8.3)

## 2012-04-29 LAB — HEMOGLOBIN A1C
Hgb A1c MFr Bld: 4.9 % (ref <5.7)
Mean Plasma Glucose: 94 mg/dL (ref <117)

## 2012-04-29 LAB — RAPID URINE DRUG SCREEN, HOSP PERFORMED
Amphetamines: NOT DETECTED
Benzodiazepines: POSITIVE — AB
Opiates: POSITIVE — AB

## 2012-04-29 LAB — INFLUENZA PANEL BY PCR (TYPE A & B)
H1N1 flu by pcr: NOT DETECTED
Influenza B By PCR: NEGATIVE

## 2012-04-29 LAB — SEDIMENTATION RATE: Sed Rate: 20 mm/hr (ref 0–22)

## 2012-04-29 LAB — GLUCOSE, CAPILLARY
Glucose-Capillary: 86 mg/dL (ref 70–99)
Glucose-Capillary: 81 mg/dL (ref 70–99)

## 2012-04-29 LAB — IRON AND TIBC
Iron: 281 ug/dL — ABNORMAL HIGH (ref 42–135)
UIBC: <15 ug/dL — ABNORMAL LOW (ref 125–400)

## 2012-04-29 LAB — BASIC METABOLIC PANEL
CO2: 32 mEq/L (ref 19–32)
Calcium: 7.7 mg/dL — ABNORMAL LOW (ref 8.4–10.5)
GFR calc Af Amer: >90 mL/min (ref >90)
GFR calc non Af Amer: >90 mL/min (ref >90)
Sodium: 136 mEq/L (ref 135–145)

## 2012-04-29 LAB — CLOSTRIDIUM DIFFICILE BY PCR: Toxigenic C. Difficile by PCR: NEGATIVE

## 2012-04-29 LAB — TSH: TSH: 6.165 u[IU]/mL — ABNORMAL HIGH (ref 0.350–4.500)

## 2012-04-29 LAB — VITAMIN B12
Vitamin B-12: 1496 pg/mL — ABNORMAL HIGH (ref 211–911)
Vitamin B-12: 1371 pg/mL — ABNORMAL HIGH (ref 211–911)

## 2012-04-29 LAB — CBC
HCT: 32.8 % — ABNORMAL LOW (ref 36.0–46.0)
Hemoglobin: 11.2 g/dL — ABNORMAL LOW (ref 12.0–15.0)
WBC: 9.4 10*3/uL (ref 4.0–10.5)

## 2012-04-29 LAB — FOLATE: Folate: 2 ng/mL — ABNORMAL LOW

## 2012-04-29 LAB — HIV ANTIBODY (ROUTINE TESTING W REFLEX): HIV: NONREACTIVE

## 2012-04-29 MED ORDER — MORPHINE SULFATE 2 MG/ML IJ SOLN
2.0000 mg | INTRAMUSCULAR | Status: DC | PRN
  Administered 2012-04-30 – 2012-05-01 (×6): 2 mg via INTRAVENOUS
  Filled 2012-04-29 (×7): qty 1

## 2012-04-29 MED ORDER — LORAZEPAM 0.5 MG PO TABS: 1.0000 mg | ORAL_TABLET | Freq: Four times a day (QID) | ORAL | Status: DC | PRN

## 2012-04-29 MED ORDER — K PHOS MONO-SOD PHOS DI & MONO 155-852-130 MG PO TABS
500.0000 mg | ORAL_TABLET | Freq: Two times a day (BID) | ORAL | Status: DC
  Administered 2012-04-30 – 2012-05-01 (×4): 500 mg via ORAL
  Filled 2012-04-29 (×9): qty 2

## 2012-04-29 MED ORDER — LORAZEPAM 2 MG/ML IJ SOLN: 0.0000 mg | Freq: Two times a day (BID) | INTRAMUSCULAR | Status: DC

## 2012-04-29 MED ORDER — ONDANSETRON HCL 4 MG/2ML IJ SOLN
4.0000 mg | Freq: Four times a day (QID) | INTRAMUSCULAR | Status: DC | PRN
  Administered 2012-04-30: 4 mg via INTRAVENOUS
  Filled 2012-04-29: qty 2

## 2012-04-29 MED ORDER — MORPHINE SULFATE 4 MG/ML IJ SOLN
4.0000 mg | INTRAMUSCULAR | Status: DC | PRN
  Administered 2012-04-29 (×2): 4 mg via INTRAVENOUS
  Filled 2012-04-29 (×2): qty 1

## 2012-04-29 MED ORDER — LOPERAMIDE HCL 2 MG PO CAPS
2.0000 mg | ORAL_CAPSULE | ORAL | Status: DC | PRN
  Administered 2012-04-29 – 2012-04-30 (×3): 2 mg via ORAL
  Filled 2012-04-29 (×3): qty 1

## 2012-04-29 MED ORDER — LORAZEPAM 2 MG/ML IJ SOLN
1.0000 mg | Freq: Four times a day (QID) | INTRAMUSCULAR | Status: DC | PRN
  Filled 2012-04-29 (×2): qty 1

## 2012-04-29 MED ORDER — DEXTROSE 5 % IV SOLN
INTRAVENOUS | Status: AC
  Filled 2012-04-29: qty 10

## 2012-04-29 MED ORDER — BOOST / RESOURCE BREEZE PO LIQD
1.0000 | Freq: Three times a day (TID) | ORAL | Status: DC
  Administered 2012-04-30 (×3): 1 via ORAL

## 2012-04-29 MED ORDER — LORAZEPAM 2 MG/ML IJ SOLN
0.0000 mg | Freq: Four times a day (QID) | INTRAMUSCULAR | Status: DC
  Administered 2012-04-29 – 2012-04-30 (×2): 2 mg via INTRAVENOUS
  Administered 2012-04-30 (×3): 1 mg via INTRAVENOUS
  Administered 2012-05-01: 2 mg via INTRAVENOUS
  Filled 2012-04-29 (×5): qty 1

## 2012-04-29 MED ORDER — POTASSIUM CHLORIDE 10 MEQ/100ML IV SOLN
10.0000 meq | INTRAVENOUS | Status: AC
  Administered 2012-04-30 (×3): 10 meq via INTRAVENOUS
  Filled 2012-04-29: qty 300

## 2012-04-29 MED ORDER — SODIUM CHLORIDE 0.9 % IJ SOLN
10.0000 mL | INTRAMUSCULAR | Status: DC | PRN
  Administered 2012-04-29 (×2): 10 mL via INTRAVENOUS

## 2012-04-29 MED ORDER — DEXTROSE 5 % IV SOLN
1.0000 g | INTRAVENOUS | Status: DC
  Administered 2012-04-29 – 2012-05-01 (×3): 1 g via INTRAVENOUS
  Filled 2012-04-29 (×5): qty 10

## 2012-04-29 MED ORDER — FOLIC ACID 1 MG PO TABS
1.0000 mg | ORAL_TABLET | Freq: Every day | ORAL | Status: DC
  Administered 2012-04-29 – 2012-05-01 (×3): 1 mg via ORAL
  Filled 2012-04-29 (×3): qty 1

## 2012-04-29 MED ORDER — MAGNESIUM SULFATE 40 MG/ML IJ SOLN
2.0000 g | Freq: Once | INTRAMUSCULAR | Status: AC
  Administered 2012-04-29: 2 g via INTRAVENOUS
  Filled 2012-04-29: qty 50

## 2012-04-29 MED ORDER — POTASSIUM CHLORIDE CRYS ER 20 MEQ PO TBCR
40.0000 meq | EXTENDED_RELEASE_TABLET | Freq: Two times a day (BID) | ORAL | Status: AC
  Administered 2012-04-29 (×2): 40 meq via ORAL
  Filled 2012-04-29 (×2): qty 2

## 2012-04-29 MED ORDER — ADULT MULTIVITAMIN W/MINERALS CH
1.0000 | ORAL_TABLET | Freq: Every day | ORAL | Status: DC
  Administered 2012-04-29 – 2012-04-30 (×2): 1 via ORAL
  Filled 2012-04-29 (×3): qty 1

## 2012-04-29 MED ORDER — VITAMIN B-1 100 MG PO TABS
100.0000 mg | ORAL_TABLET | Freq: Every day | ORAL | Status: DC
  Administered 2012-04-29 – 2012-05-01 (×3): 100 mg via ORAL
  Filled 2012-04-29 (×3): qty 1

## 2012-04-29 MED ORDER — POTASSIUM CHLORIDE 10 MEQ/100ML IV SOLN
10.0000 meq | Freq: Once | INTRAVENOUS | Status: AC
  Administered 2012-04-29: 10 meq via INTRAVENOUS
  Filled 2012-04-29: qty 100

## 2012-04-29 MED ORDER — THIAMINE HCL 100 MG/ML IJ SOLN: 100.0000 mg | Freq: Every day | INTRAMUSCULAR | Status: DC

## 2012-04-29 MED ORDER — ZOLPIDEM TARTRATE 5 MG PO TABS
5.0000 mg | ORAL_TABLET | Freq: Every day | ORAL | Status: DC
  Administered 2012-04-29: 5 mg via ORAL
  Filled 2012-04-29: qty 1

## 2012-04-29 NOTE — Progress Notes (Signed)
Patient is not tolerating contrast very well, Phenergan has been given, called and made Dr. Phillips Odor aware

## 2012-04-29 NOTE — Code Documentation (Addendum)
Code Blue Note  Interval History: Code Blue called on [name] at apx. 20:00. [name] was using bedside commode when she collapsed to the floor and was witnessed by NT to have had LOC, cyanosis and brief tonic clonic movements. When I arrived to the room she was lying flat on her back, awake, tremulous, confused and agitated. She was placed on the the monitor and Zoll pads place. She had sinus tachycardia, her blood pressure was moderately elevated apx 150/100, HR was 110's, sats low 90s. Before patient was moved off of floor her C-Spine was stabilized with a C-Collar and I examined her completely for any evidence of unstable fractures or injuries. She was a 5 person assist off the floor. Further examination reveal skin tears on her knees, possible chipped tooth, no obvious head truma, she is oriented to person, but not place, and is confused. At 1700 she received one dose of 4mg  IV morphine and 12.5 of IV phenergan for persistent nausea. Previous she has tolerated these medications well.  Assessment:  Strong suspicion for occult ETOH abuse with active withdrawal seizure- symptoms consistent with ETOH withdrawal possibly in setting of her viral illness. She denies regular ETOH use on admission but also reported on week ago have had several drinks. She told me she quit drinking heavily in 2010 on admission. Her clinical exam findings suggest possile early DT, she is very tremulous, has diarrhea, labs also suggest ETOH abuse with low mag, low platelets and abnormal LFTs, macrocytosis.  Plan:  1. Initiated CIWA protocol with IV Ativan scheduled based on assessment score. 2. LR used for IV fluids will transition to daily Thiamine and folate. 3. Magnesium low at 1.2 and is probably contributing to hypokalemia, replete and recheck in AM 4. Check 12 lead for arrythmias, if DTs may have elevated cardiac markers, Mag low and check phos. For nutritional depletion 5. Loperimide for diarrhea, infectious w/u  negative. 6. CSW consult for resources. 7. Patient will need close monitoring, may need to consider transferring her to Step-down if she deteriorates. 8. CT head and neck to r/o head injury or C-Spine injury.   Time: 45 minutes at bedside actively managing a critically ill patient and 15 minutes spent documenting and reviewing history. Total time 60 minutes.   2300: Phos 1.9 low, repleted with neutra phos PO, can consider IV replacement per pharmacy if continues to not respond to oral. K also remains low despite aggressive repletion, will give 3 more IV runs in addition to Mag and Immodium for diarrhea. Post event order results reviewed.   Will continue to follow.

## 2012-04-29 NOTE — Progress Notes (Signed)
CRITICAL VALUE ALERT  Critical value received:  K+ 2.4  Date of notification:  04/29/12  Time of notification:  2525  Critical value read back: yes  Nurse who received alert:  Foye Deer RN  MD notified (1st page):  Aileen Fass  Time of first page:  2227  MD notified (2nd page):  Time of second page:  Responding MD:  Dr. Phillips Odor  Time MD responded:  04/29/12 see MAR

## 2012-04-29 NOTE — Progress Notes (Signed)
UR Chart Review Completed  

## 2012-04-29 NOTE — Progress Notes (Signed)
CRITICAL VALUE ALERT  Critical value received: Potassium 2.6  Date of notification:  04/29/12  Time of notification:  0643  Critical value read back:yes  Nurse who received alert:  Norton Pastel, RN  MD notified (1st page):  Dr. Phillips Odor  Time of first page:  [phone]  MD notified (2nd page):  Time of second page:  Responding MD:   Time MD responded:    Slight improvement from previous lab result, will send page to Dr. Phillips Odor to inform

## 2012-04-29 NOTE — Progress Notes (Signed)
Subjective: This lady gives a 2 to three-week history of nausea vomiting and diarrhea. 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 I have not heard of this associated with this syndrome.. She is requesting pain medications.           Physical Exam: Blood pressure 150/90, pulse 94, temperature 100.2 F (37.9 C), temperature source Oral, resp. rate 16, height 5\' 4"  (1.626 m), weight 86.9 kg (191 lb 9.3 oz), SpO2 89.00%. She looks systemically well. She appears to have some malar flush in her face. Abdomen is soft and largely nontender. Lung fields are clear. She is alert and orientated.   Investigations:  Recent Results (from the past 240 hour(s))  CLOSTRIDIUM DIFFICILE BY PCR     Status: Normal   Collection Time   04/29/12  2:58 AM      Component Value Range Status Comment   C difficile by pcr NEGATIVE  NEGATIVE Final      Basic Metabolic Panel:  Basename 04/29/12 0423 04/28/12 2158 04/28/12 1554  NA 141 -- 142  K 2.6* -- 2.4*  CL 96 -- 93*  CO2 35* -- 36*  GLUCOSE 84 -- 128*  BUN 15 -- 12  CREATININE 0.44* 0.53 --  CALCIUM 7.8* -- 9.2  MG -- 1.2* --  PHOS -- 2.3 --   Liver Function Tests:  [facility] 04/29/12 0423 04/28/12 1554  AST 36 44*  ALT 17 23  ALKPHOS 234* 303*  BILITOT 2.0* 2.0*  PROT 6.8 8.0  ALBUMIN 3.3* 3.9     CBC:  Basename 04/29/12 0423 04/28/12 2158 04/28/12 1554  WBC 9.4 9.7 --  NEUTROABS -- -- 7.7  HGB 11.2* 13.0 --  HCT 32.8* 37.3 --  MCV 117.1* 116.6* --  PLT 103* 125* --    Ct Abdomen Pelvis Wo Contrast  04/29/2012  *RADIOLOGY REPORT*  Clinical Data: Hematuria.  Abnormal liver function tests. Abdominal pain.  Diarrhea.  Vomiting.  The patient refused intravenous contrast material due to history of multiple allergies and fear of reaction.  CT ABDOMEN AND PELVIS WITHOUT CONTRAST  Technique:  Multidetector CT imaging of the abdomen and pelvis was  performed following the standard protocol without intravenous contrast.  Comparison: 10/01/2009  Findings: Lung bases are clear.  Surgical absence of the gallbladder.  Diffuse low attenuation change in the liver suggesting diffuse fatty infiltration.  Normal spleen size. No adrenal gland nodules.  Scattered calcifications in the abdominal aorta without aneurysm.  There is hazy infiltration in the retroperitoneal fat and pericolic gutters with small lymph nodes suggesting inflammatory process.  The pancreas is atrophic. No significant mesenteric lymphadenopathy.  The kidneys appear symmetrical in size and shape.  No pyelocaliectasis or ureterectasis.  No renal, ureteral, or bladder stones.  Calcified phleboliths in the pelvis.  No bladder wall thickening.  The stomach, small bowel, and colon are decompressed.  Decompression of the colon limits evaluation for wall thickening.  No free fluid or free air in the abdomen.  Pelvis:  The uterus and adnexal structures are not enlarged.  No free or loculated pelvic fluid collections.  No significant pelvic lymphadenopathy.  The appendix is normal.  No evidence of diverticulitis.  Degenerative changes in the lumbar spine. Focal gas in the subcutaneous tissues of the right lower quadrant likely represent injection site.  IMPRESSION: Infiltration in the retroperitoneal fat and pericolic gutters with small lymph nodes present.  Changes  suggest inflammatory process. Consider retroperitoneal fibrosis or fat necrosis.  Colitis, bilateral pyelonephritis, duodenitis or pancreatitis could potentially give this appearance, although no specific abnormality of these organs is identified.   Original Report Authenticated By: Burman Nieves, M.D.    Dg Abd Acute W/chest  04/28/2012  *RADIOLOGY REPORT*  Clinical Data: Abdominal pain.  Nausea and vomiting.  ACUTE ABDOMEN SERIES (ABDOMEN 2 VIEW & CHEST 1 VIEW)  Comparison: One-view chest to 11/10/2011.  CT abdomen pelvis 10/01/2009.   Findings: The heart size is exaggerated by low lung volumes.  The lungs are clear.  Supine and upright views of the abdomen demonstrate nonspecific bowel gas pattern.  There is no evidence for obstruction or free air.  Surgical clips are present in the right upper quadrant. Phleboliths are noted in the anatomic pelvis.  Degenerative changes are present in the lower lumbar spine.  IMPRESSION:  1.  No acute abnormality of the chest or abdomen.   Original Report Authenticated By: Marin Roberts, M.D.       Medications: I have reviewed the patient's current medications.  Impression: 1. Nausea and vomiting and diarrhea, picture of viral gastroenteritis. 2. Elevated liver enzymes, previously documented to have hepatitis C. 3. Microcytosis, previously documented to have vitamin B 12 deficiency I believe. 4. Abnormal CT scan, unclear significance. Her abdomen appears to be benign on examination. 5. Hypokalemia. Likely secondary to diarrhea.     Plan: 1. Replete potassium. 2. Hepatitis panel. 3. Check B12 levels and folate levels.     LOS: 1 day   Wilson Singer Pager [phone]  04/29/2012, 10:43 AM

## 2012-04-29 NOTE — Progress Notes (Signed)
Patient left floor via wheelchair to go to CT

## 2012-04-29 NOTE — Progress Notes (Signed)
INITIAL NUTRITION ASSESSMENT  DOCUMENTATION CODES Per approved criteria  -Obesity Unspecified   INTERVENTION: Resource Breeze po TID, each supplement provides 250 kcal and 9 grams of protein.  NUTRITION DIAGNOSIS: Inadequate oral intake related to nausea and vomiting as evidenced by pt diet hx review and current diet of clear liquids, with emesis.   Goal: Pt to meet >/= 90% of their estimated nutrition needs; not met  Monitor:  Diet progression and tolerance, po intake, labs  Reason for Assessment: Malnutrition Screen  52 y.o. female  Admitting Dx: Abdominal pain  ASSESSMENT: Pt reports wt loss over past week due to nausea, vomiting and diarrhea. She is unable to quantify amount. Poor po intake and episode of clear emesis earlier today. Unable to verify wt change at this time but certainly her nutrition intake is inadequate to meet estimated needs currently and she is at risk for malnutrition.   Height: Ht Readings from Last 1 Encounters:  04/28/12 5\' 4"  (1.626 m)    Weight: Wt Readings from Last 1 Encounters:  04/28/12 191 lb 9.3 oz (86.9 kg)    Ideal Body Weight: 120# (54.5kg)  % Ideal Body Weight: 159%  Wt Readings from Last 10 Encounters:  04/28/12 191 lb 9.3 oz (86.9 kg)  04/25/12 200 lb (90.719 kg)  01/26/12 150 lb (68.04 kg)  11/10/11 168 lb (76.204 kg)  07/30/11 170 lb (77.111 kg)  07/25/11 170 lb (77.111 kg)    Usual Body Weight: 165-168#   BMI:  Body mass index is 32.88 kg/(m^2). Obesity Class I  Estimated Nutritional Needs: Kcal: 1400-1700 Protein: 80-90 gr Fluid: 1 ml/kcal  Skin: no issues noted  Diet Order: Clear Liquid  EDUCATION NEEDS: -No education needs identified at this time   Intake/Output Summary (Last 24 hours) at 04/29/12 1536 Last data filed at 04/29/12 1529  Gross per 24 hour  Intake      0 ml  Output   1400 ml  Net  -1400 ml    Last BM: 04/29/12  Labs:   Lab 04/29/12 0423 04/28/12 2158 04/28/12 1554 04/25/12  1239  NA 141 -- 142 147*  K 2.6* -- 2.4* 2.8*  CL 96 -- 93* 105  CO2 35* -- 36* 32  BUN 15 -- 12 5*  CREATININE 0.44* 0.53 0.50 --  CALCIUM 7.8* -- 9.2 8.5  MG -- 1.2* -- --  PHOS -- 2.3 -- --  GLUCOSE 84 -- 128* 87    CBG (last 3)   Basename 04/29/12 1201 04/29/12 0754  GLUCAP 81 86    Scheduled Meds:   . aspirin EC  81 mg Oral Daily  . cefTRIAXone (ROCEPHIN)  IV  1 g Intravenous Q24H  . enoxaparin (LOVENOX) injection  40 mg Subcutaneous Q24H  . levothyroxine  50 mcg Oral QAC breakfast  . potassium chloride  40 mEq Oral BID  . pregabalin  100 mg Oral TID  . sodium chloride  3 mL Intravenous Q12H  . zolpidem  5 mg Oral QHS    Continuous Infusions:   Past Medical History  Diagnosis Date  . Guillain-Barre   . Blind   . Neuropathy   . Thyroid disease   . Back pain   . Arthritis   . Seizures   . UTI (lower urinary tract infection)   . Anxiety   . Depression   . DDD (degenerative disc disease), lumbar     Past Surgical History  Procedure Date  . Cholecystectomy   . Tubal ligation   .  Femur fracture surgery   . Ankle surgery   . Back surgery     [phone]

## 2012-04-29 NOTE — H&P (Signed)
[facility] History and Physical  [name] ZOX:[id number] DOB: May 08, 1960 DOA: 04/28/2012  Referring physician: Weber Cooks  PCP: Ernestine Conrad, MD  Specialists: none  Chief Complaint: Abdominal pain, diarrhea, body aches.  HPI: [name] is a 52 y.o. female with a past medical history significant for Guillain-Barr syndrome, bipolar disease, and arthritis who presents to the emergency department for the second time this week complaining of generalized body aching, upper respiratory symptoms including cough and congestion, abdominal pain with profuse diarrhea every hour which she describes as watery, posttussive emesis versus vomiting, poor by mouth intake, and worsening fatigue and weakness. She came into the ED this evening because instead of her symptoms getting better they have been progressively getting worse she was previously diagnosed with a viral syndrome and discharged from the emergency department 2 days ago. Admission is requested today based on her abnormal laboratory findings of severe hypokalemia at 2.4, dehydration, abdominal pain and hematuria/proteinuria.    Review of Systems: Review of Systems  Constitutional: Positive for fever, chills, weight loss and malaise/fatigue.  HENT: Positive for congestion and sore throat.   Eyes: Positive for blurred vision.  Respiratory: Positive for cough and sputum production. Negative for hemoptysis, shortness of breath and wheezing.   Cardiovascular: Positive for chest pain. Negative for palpitations, orthopnea and leg swelling.  Gastrointestinal: Positive for nausea, vomiting, abdominal pain and diarrhea. Negative for constipation, blood in stool and melena.  Genitourinary: Positive for hematuria and flank pain. Negative for dysuria, urgency and frequency.  Musculoskeletal: Positive for myalgias, back pain and joint pain.  Skin: Positive for rash.  Neurological: Positive for dizziness, weakness and headaches. Negative for  tremors, sensory change, speech change, focal weakness and seizures.  Endo/Heme/Allergies: Bruises/bleeds easily.  Psychiatric/Behavioral: Positive for depression. Negative for hallucinations and substance abuse. The patient is nervous/anxious and has insomnia.   All other systems reviewed and are negative.      Past Medical History  Diagnosis Date  . Guillain-Barre   . Blind   . Neuropathy   . Thyroid disease   . Back pain   . Arthritis   . Seizures   . UTI (lower urinary tract infection)   . Anxiety   . Depression   . DDD (degenerative disc disease), lumbar    Past Surgical History  Procedure Date  . Cholecystectomy   . Tubal ligation   . Femur fracture surgery   . Ankle surgery   . Back surgery    Social History:  reports that she has been smoking Cigarettes.  She has been smoking about .5 packs per day. She does not have any smokeless tobacco history on file. She reports that she does not drink alcohol or use illicit drugs.   Allergies  Allergen Reactions  . Neurontin (Gabapentin) Anaphylaxis  . Acetaminophen Itching and Nausea And Vomiting  . Eggs Or Egg-Derived Products Nausea And Vomiting  . Codeine Itching and Nausea And Vomiting    Patient is able to take morphine, demerol and fentanyl with out any type of side effects    History reviewed. No pertinent family history. Family History positive for HTN and CAD.  Prior to Admission medications   Medication Sig Start Date End Date Taking? Authorizing Provider  cyanocobalamin (,VITAMIN B-12,) 1000 MCG/ML injection Inject 1,000 mcg into the muscle once a week. Sunday   Yes Historical Provider, MD  diclofenac sodium (VOLTAREN) 1 % GEL Apply 1 application topically 4 (four) times daily as needed. For neuropathy pains  Yes Historical Provider, MD  ibuprofen (ADVIL,MOTRIN) 200 MG tablet Take 200 mg by mouth every 6 (six) hours as needed. For pain   Yes Historical Provider, MD  levothyroxine (SYNTHROID, LEVOTHROID) 50  MCG tablet Take 50 mcg by mouth every morning.   Yes Historical Provider, MD  potassium chloride SA (K-DUR,KLOR-CON) 20 MEQ tablet Take 1 tablet (20 mEq total) by mouth 2 (two) times daily. 04/25/12  Yes Donnetta Hutching, MD  pregabalin (LYRICA) 100 MG capsule Take 100 mg by mouth 3 (three) times daily.    Yes Historical Provider, MD   Physical Exam: Filed Vitals:   04/28/12 2000 04/28/12 2044 04/28/12 2100 04/28/12 2244  BP: 99/80 99/80 127/81 138/96  Pulse: 96 102 91 92  Temp:    100 F (37.8 C)  TempSrc:    Oral  Resp: 12 24 11 24   Height:    5\' 4"  (1.626 m)  Weight:    86.9 kg (191 lb 9.3 oz)  SpO2: 94% 97% 98% 92%     General:  Appears older than stated age, moderately ill, her face has a red raised rash  Eyes: Conjunctivae is red, there is a hyperemic area on her right sclera, possible area of conjunctival hemorrhage  ENT: Dry mucous membranes, poor dentition  Neck: No adenopathy  Cardiovascular: Tachycardic but regular no murmurs rubs or gallops  Respiratory: Scattered wheezes but fairly good air movement  Abdomen: Abdomen is nondistended she is not guarding there is some mild right upper quadrant tenderness and flank tenderness  Skin: Multiple areas of ecchymosis and petechiae mostly on extremities  Musculoskeletal: She has changes of osteoarthritis in her hands, wrists, knees. She has a left ankle joint deformity from an old fracture  Psychiatric: Appropriate no obvious abnormalities  Neurologic: Nonfocal  Labs on Admission:  Basic Metabolic Panel:  Lab 04/28/12 1191 04/28/12 1554 04/25/12 1239  NA -- 142 147*  K -- 2.4* 2.8*  CL -- 93* 105  CO2 -- 36* 32  GLUCOSE -- 128* 87  BUN -- 12 5*  CREATININE 0.53 0.50 0.52  CALCIUM -- 9.2 8.5  MG 1.2* -- --  PHOS 2.3 -- --   Liver Function Tests:  Lab 04/28/12 1554  AST 44*  ALT 23  ALKPHOS 303*  BILITOT 2.0*  PROT 8.0  ALBUMIN 3.9    Lab 04/28/12 1554  LIPASE 5*  AMYLASE --   No results found for this  basename: AMMONIA:5 in the last 168 hours CBC:  Lab 04/28/12 2158 04/28/12 1554 04/25/12 1239  WBC 9.7 9.7 5.4  NEUTROABS -- 7.7 2.6  HGB 13.0 12.9 12.7  HCT 37.3 37.3 36.2  MCV 116.6* 115.8* 114.6*  PLT 125* 123* 141*    Radiological Exams on Admission: Ct Abdomen Pelvis Wo Contrast  04/29/2012  *RADIOLOGY REPORT*  Clinical Data: Hematuria.  Abnormal liver function tests. Abdominal pain.  Diarrhea.  Vomiting.  The patient refused intravenous contrast material due to history of multiple allergies and fear of reaction.  CT ABDOMEN AND PELVIS WITHOUT CONTRAST  Technique:  Multidetector CT imaging of the abdomen and pelvis was performed following the standard protocol without intravenous contrast.  Comparison: 10/01/2009  Findings: Lung bases are clear.  Surgical absence of the gallbladder.  Diffuse low attenuation change in the liver suggesting diffuse fatty infiltration.  Normal spleen size. No adrenal gland nodules.  Scattered calcifications in the abdominal aorta without aneurysm.  There is hazy infiltration in the retroperitoneal fat and pericolic gutters with small lymph nodes  suggesting inflammatory process.  The pancreas is atrophic. No significant mesenteric lymphadenopathy.  The kidneys appear symmetrical in size and shape.  No pyelocaliectasis or ureterectasis.  No renal, ureteral, or bladder stones.  Calcified phleboliths in the pelvis.  No bladder wall thickening.  The stomach, small bowel, and colon are decompressed.  Decompression of the colon limits evaluation for wall thickening.  No free fluid or free air in the abdomen.  Pelvis:  The uterus and adnexal structures are not enlarged.  No free or loculated pelvic fluid collections.  No significant pelvic lymphadenopathy.  The appendix is normal.  No evidence of diverticulitis.  Degenerative changes in the lumbar spine. Focal gas in the subcutaneous tissues of the right lower quadrant likely represent injection site.  IMPRESSION:  Infiltration in the retroperitoneal fat and pericolic gutters with small lymph nodes present.  Changes suggest inflammatory process. Consider retroperitoneal fibrosis or fat necrosis.  Colitis, bilateral pyelonephritis, duodenitis or pancreatitis could potentially give this appearance, although no specific abnormality of these organs is identified.   Original Report Authenticated By: Burman Nieves, M.D.    Dg Abd Acute W/chest  04/28/2012  *RADIOLOGY REPORT*  Clinical Data: Abdominal pain.  Nausea and vomiting.  ACUTE ABDOMEN SERIES (ABDOMEN 2 VIEW & CHEST 1 VIEW)  Comparison: One-view chest to 11/10/2011.  CT abdomen pelvis 10/01/2009.  Findings: The heart size is exaggerated by low lung volumes.  The lungs are clear.  Supine and upright views of the abdomen demonstrate nonspecific bowel gas pattern.  There is no evidence for obstruction or free air.  Surgical clips are present in the right upper quadrant. Phleboliths are noted in the anatomic pelvis.  Degenerative changes are present in the lower lumbar spine.  IMPRESSION:  1.  No acute abnormality of the chest or abdomen.   Original Report Authenticated By: Marin Roberts, M.D.     EKG: Independently reviewed. Abnormal EKG with low voltage QRS and a left axis no signs of acute ischemia or ST elevation. T wave flattening in the inferior leads compared to old EKGs.  Assessment/Plan Principal Problem:  *Abdominal pain Active Problems:  Hypokalemia  Dehydration  Hematuria  Thrombocytopenia  Abnormal LFTs  Macrocytosis  Hyperglycemia  Proteinuria  Hypoxia  Malar rash  Nausea vomiting and diarrhea   1. Abdominal Pain with Nausea, Vomiting and Diarrhea and associated elevated Alk phosphatase, dehydration with hypokalemia and hematuria. CT of abdomen showed retrocolic, paracolic gutter adenopathy, reactive but non-specific etiology, patient could not tolerate oral contrast do study was limited. C. Diff is negative. Lipase normal. No focal  Abscess. Unclear etiology. Stool Studies pending. IV fluid hydration. Symptom management with anti-emetics. 2. Hypokalemia-presumed due to GI loss, repleted-Mag pending 3. Hematuria with >300 Proteinuria, concerning for GN, but BUN/SCr normal. No renal stones on CT. Check ANA and Hepatitis panel. 4. Significant Macrocytosis MCV 115, denies being a heavy drinker, however questionable, last drink was 1 weeks ago- "3-4 mixed drinks"-sent for peripheral smear and anemia panel, suspect B12 or folate deficiency vs. Virally mediated. Check HIV. 5. Thrombocytopenia: peripheral smear, ?post-viral, she has scattered extremity bruising. 6. Malar rash, erythroderma v. [place]: she has significant arthritis in her hands but in OA distribution-age advanced-may be autoimmune process going on. Await ANA.She says face rash is new. 7. Hypoxia, chronic lung disease - intitial O2 sat 85% resolved with O2 [place].Unclear etiology-possible opiate overuse. 8. Chronic Pain- mostly from OA, used to see pain doctor, now changing her PCP. May be abnormal pattern of pain medication use vs.  Pseudo-addiction. 9. Slightly abnormal EKG with low-voltage pattern we'll obtain a 2-D echo.  Code Status:[place] Family Communication: Discussed with patient at bedside Disposition Plan: Home when medically stable Time spent: 70 minutes  [facility] Pager [phone]  If 7PM-7AM, please contact night-coverage [email or web address] Password [facility] 04/29/2012, 4:17 AM

## 2012-04-30 ENCOUNTER — Inpatient Hospital Stay (HOSPITAL_COMMUNITY): Payer: Medicare Other

## 2012-04-30 DIAGNOSIS — E039 Hypothyroidism, unspecified: Secondary | ICD-10-CM | POA: Diagnosis present

## 2012-04-30 DIAGNOSIS — G40909 Epilepsy, unspecified, not intractable, without status epilepticus: Secondary | ICD-10-CM | POA: Diagnosis present

## 2012-04-30 DIAGNOSIS — E876 Hypokalemia: Secondary | ICD-10-CM

## 2012-04-30 DIAGNOSIS — E538 Deficiency of other specified B group vitamins: Secondary | ICD-10-CM | POA: Diagnosis present

## 2012-04-30 DIAGNOSIS — F1010 Alcohol abuse, uncomplicated: Secondary | ICD-10-CM | POA: Diagnosis present

## 2012-04-30 LAB — MAGNESIUM: Magnesium: 1.8 mg/dL (ref 1.5–2.5)

## 2012-04-30 LAB — COMPREHENSIVE METABOLIC PANEL
Alkaline Phosphatase: 189 U/L — ABNORMAL HIGH (ref 39–117)
BUN: 7 mg/dL (ref 6–23)
GFR calc Af Amer: >90 mL/min (ref >90)
GFR calc non Af Amer: >90 mL/min (ref >90)
Glucose, Bld: 78 mg/dL (ref 70–99)
Potassium: 2.7 mEq/L — CL (ref 3.5–5.1)
Total Bilirubin: 1 mg/dL (ref 0.3–1.2)
Total Protein: 6.4 g/dL (ref 6.0–8.3)

## 2012-04-30 LAB — URINE CULTURE: Colony Count: >=100000

## 2012-04-30 LAB — PATHOLOGIST SMEAR REVIEW

## 2012-04-30 LAB — FOLATE RBC: RBC Folate: 186 ng/mL — ABNORMAL LOW (ref >=366)

## 2012-04-30 LAB — HEPATITIS PANEL, ACUTE
HCV Ab: REACTIVE — AB
HCV Ab: REACTIVE — AB
Hep A IgM: NEGATIVE
Hep B C IgM: NEGATIVE

## 2012-04-30 LAB — CBC
HCT: 28.8 % — ABNORMAL LOW (ref 36.0–46.0)
Hemoglobin: 9.9 g/dL — ABNORMAL LOW (ref 12.0–15.0)
MCHC: 34.4 g/dL (ref 30.0–36.0)

## 2012-04-30 MED ORDER — PROMETHAZINE HCL 25 MG/ML IJ SOLN
12.5000 mg | Freq: Four times a day (QID) | INTRAMUSCULAR | Status: DC | PRN
  Administered 2012-04-30 (×2): 12.5 mg via INTRAVENOUS
  Filled 2012-04-30 (×3): qty 1

## 2012-04-30 MED ORDER — POTASSIUM CHLORIDE CRYS ER 20 MEQ PO TBCR
40.0000 meq | EXTENDED_RELEASE_TABLET | Freq: Three times a day (TID) | ORAL | Status: DC
  Administered 2012-04-30: 40 meq via ORAL
  Filled 2012-04-30: qty 2

## 2012-04-30 MED ORDER — BIOTENE DRY MOUTH MT LIQD
15.0000 mL | Freq: Two times a day (BID) | OROMUCOSAL | Status: DC
  Administered 2012-04-30 (×2): 15 mL via OROMUCOSAL

## 2012-04-30 MED ORDER — LOPERAMIDE HCL 2 MG PO CAPS
4.0000 mg | ORAL_CAPSULE | Freq: Four times a day (QID) | ORAL | Status: DC
  Administered 2012-05-01 (×2): 4 mg via ORAL
  Filled 2012-04-30 (×2): qty 2

## 2012-04-30 MED ORDER — POTASSIUM CHLORIDE 10 MEQ/100ML IV SOLN
10.0000 meq | INTRAVENOUS | Status: AC
  Administered 2012-04-30 (×6): 10 meq via INTRAVENOUS
  Filled 2012-04-30: qty 300
  Filled 2012-04-30: qty 600
  Filled 2012-04-30: qty 100

## 2012-04-30 MED ORDER — LEVOTHYROXINE SODIUM 75 MCG PO TABS
75.0000 ug | ORAL_TABLET | Freq: Every day | ORAL | Status: DC
Start: 2012-05-01 — End: 2012-05-01
  Administered 2012-05-01: 75 ug via ORAL
  Filled 2012-04-30: qty 1

## 2012-04-30 MED ORDER — ACETAMINOPHEN 10 MG/ML IV SOLN
1000.0000 mg | Freq: Four times a day (QID) | INTRAVENOUS | Status: DC
  Administered 2012-05-01 (×2): 1000 mg via INTRAVENOUS
  Filled 2012-04-30 (×4): qty 100

## 2012-04-30 NOTE — Progress Notes (Addendum)
Called by RN re: HR 170-200 on monitor. Patient seen and evaluated. Patient complains of agitation asking for more frequent ativan. Denies CP. Worsening SOB, now wheezing, c/o cough, dry heaves, fever of 102. Per RN patient startling, talking to herself in room. Orientation is questionable, she struggles with date, cannot add simple numbers, says she is at [facility]. Tremor is mild, intention type. Current CIWA is 20 by my assessment.  Obtained 12 lead EKG-sinus tachycardia at 92 which is what I auscultated-but on the monitor she does appear to have a fluctuation HR and there is also some artifact which I believe is why she is registering in the 200's-will try replacing leads. Fever may be driving tachycardia. No obvious source of infection other than a UTI which has been treated since admission with Rocephin and should not be causing a temp spike at this point.  1. Sinus Tacycardia, Rate 90-110's on 12 lead-tele shows rates in 100's-140's w/o artifact. Probably related to fever and ETOH withdrawal syndrome. If persists can consider B-Blocker, IV Tylenol for fever, ativan for withdrawal. May need step-down level of care if Ativan needs are higher and more frequent.Check a troponin. Repeat BMET. Check CBC. Scheduled loperimide.  2. Fever 102.9, worsening chest congestion and wheezing. Check portable CXR. Continue Rocephin. Will avoid albuterol which may make K and tremor/tachy worse. O2 sats normal.  3. ETOH withdrawal: Suspect this is the primary underlying condition she is now at apx. 48 hour mark since admission-explains most of her symptoms. Her Ativan is being under-dosed currently. Nursing order written to give ativan exactly as ordered by protocol and do full CIWA assessment.  Will give additional dose of Ativan 2mg . Will transfer to step down-discussed with RN and [facility], she is requiring very frequent monitoring and higher dose of ativan which may lead to sedation- but she was barely affected  by 2 mg IV.  Restarted IV fluids NS with 40Kcl at 100cc/hr for 24 hours.  Time: 45 minutes. Prolonged care.

## 2012-04-30 NOTE — Progress Notes (Signed)
*  PRELIMINARY RESULTS* Echocardiogram 2D Echocardiogram has been performed.  Conrad Sterling 04/30/2012, 11:18 AM

## 2012-04-30 NOTE — Progress Notes (Signed)
Subjective: This lady gives a 2 to three-week history of nausea vomiting and diarrhea. 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 I have not heard of this associated with this syndrome.. She is requesting pain medications. Events of yesterday evening noted.           Physical Exam: Blood pressure 120/83, pulse 80, temperature 97.8 F (36.6 C), temperature source Oral, resp. rate 16, height 5\' 4"  (1.626 m), weight 86.9 kg (191 lb 9.3 oz), SpO2 96.00%. She looks systemically well. She appears to have some malar flush in her face. Abdomen is soft and largely nontender. Lung fields are clear. She is alert and orientated.   Investigations:  Recent Results (from the past 240 hour(s))  URINE CULTURE     Status: Normal (Preliminary result)   Collection Time   04/28/12  5:30 PM      Component Value Range Status Comment   Specimen Description URINE, CATHETERIZED   Final    Special Requests NONE   Final    Culture  Setup Time 04/29/2012 01:32   Final    Colony Count >=100,000 COLONIES/ML   Final    Culture ESCHERICHIA COLI   Final    Report Status PENDING   Incomplete   CLOSTRIDIUM DIFFICILE BY PCR     Status: Normal   Collection Time   04/29/12  2:58 AM      Component Value Range Status Comment   C difficile by pcr NEGATIVE  NEGATIVE Final      Basic Metabolic Panel:  Basename 04/30/12 0339 04/29/12 2115 04/29/12 2114 04/28/12 2158  NA 137 -- 136 --  K 2.7* -- 2.4* --  CL 97 -- 96 --  CO2 34* -- 32 --  GLUCOSE 78 -- 87 --  BUN 7 -- 11 --  CREATININE 0.41* -- 0.42* --  CALCIUM 7.7* -- 7.7* --  MG 1.8 -- -- 1.2*  PHOS 4.0 1.9* -- --   Liver Function Tests:  [facility] 04/30/12 0339 04/29/12 0423  AST 33 36  ALT 13 17  ALKPHOS 189* 234*  BILITOT 1.0 2.0*  PROT 6.4 6.8  ALBUMIN 3.1* 3.3*     CBC:  Basename 04/30/12 0339 04/29/12 0423 04/28/12 1554  WBC 5.5 9.4 --  NEUTROABS --  -- 7.7  HGB 9.9* 11.2* --  HCT 28.8* 32.8* --  MCV 117.1* 117.1* --  PLT 89* 103* --    Ct Abdomen Pelvis Wo Contrast  04/29/2012  *RADIOLOGY REPORT*  Clinical Data: Hematuria.  Abnormal liver function tests. Abdominal pain.  Diarrhea.  Vomiting.  The patient refused intravenous contrast material due to history of multiple allergies and fear of reaction.  CT ABDOMEN AND PELVIS WITHOUT CONTRAST  Technique:  Multidetector CT imaging of the abdomen and pelvis was performed following the standard protocol without intravenous contrast.  Comparison: 10/01/2009  Findings: Lung bases are clear.  Surgical absence of the gallbladder.  Diffuse low attenuation change in the liver suggesting diffuse fatty infiltration.  Normal spleen size. No adrenal gland nodules.  Scattered calcifications in the abdominal aorta without aneurysm.  There is hazy infiltration in the retroperitoneal fat and pericolic gutters with small lymph nodes suggesting inflammatory process.  The pancreas is atrophic. No significant mesenteric lymphadenopathy.  The kidneys appear symmetrical in size and shape.  No pyelocaliectasis or ureterectasis.  No renal, ureteral, or bladder stones.  Calcified phleboliths in the pelvis.  No bladder wall thickening.  The stomach, small bowel, and colon are decompressed.  Decompression of the colon limits evaluation for wall thickening.  No free fluid or free air in the abdomen.  Pelvis:  The uterus and adnexal structures are not enlarged.  No free or loculated pelvic fluid collections.  No significant pelvic lymphadenopathy.  The appendix is normal.  No evidence of diverticulitis.  Degenerative changes in the lumbar spine. Focal gas in the subcutaneous tissues of the right lower quadrant likely represent injection site.  IMPRESSION: Infiltration in the retroperitoneal fat and pericolic gutters with small lymph nodes present.  Changes suggest inflammatory process. Consider retroperitoneal fibrosis or fat necrosis.   Colitis, bilateral pyelonephritis, duodenitis or pancreatitis could potentially give this appearance, although no specific abnormality of these organs is identified.   Original Report Authenticated By: Burman Nieves, M.D.    Ct Head Wo Contrast  04/29/2012  *RADIOLOGY REPORT*  Clinical Data:  Fall, Seizure, confusion while on inpatient floor,seizure.  CT HEAD WITHOUT CONTRAST CT CERVICAL SPINE WITHOUT CONTRAST  Technique:  Multidetector CT imaging of the head and cervical spine was performed following the standard protocol without IV contrast. Multiplanar CT image reconstructions of the cervical spine were also generated.  Comparison: 07/30/2011  CT HEAD  Findings: Atherosclerotic and physiologic intracranial calcifications.  Mild atrophy. There is no evidence of acute intracranial hemorrhage, brain edema, mass lesion, acute infarction,   mass effect, or midline shift. Acute infarct may be inapparent on noncontrast CT.  No other intra-axial abnormalities are seen, and the ventricles and sulci are within normal limits in size and symmetry.   No abnormal extra-axial fluid collections or masses are identified.  No significant calvarial abnormality.  IMPRESSION: 1. Negative for bleed or other acute intracranial process.  CT CERVICAL SPINE  Findings: Normal alignment.  No prevertebral soft tissue swelling. Vertebral body and intervertebral disc height well maintained throughout.  Negative for fracture.  No significant osseous degenerative change.  Patient motion degrades reconstructions. Partially calcified bilateral carotid bifurcation plaque.  IMPRESSION:  1.  Negative for fracture or other acute bony abnormality. 2.  Bilateral carotid bifurcation plaque.   Original Report Authenticated By: D. Andria Rhein, MD    Ct Cervical Spine Wo Contrast  04/29/2012  *RADIOLOGY REPORT*  Clinical Data:  Fall, Seizure, confusion while on inpatient floor,seizure.  CT HEAD WITHOUT CONTRAST CT CERVICAL SPINE WITHOUT CONTRAST   Technique:  Multidetector CT imaging of the head and cervical spine was performed following the standard protocol without IV contrast. Multiplanar CT image reconstructions of the cervical spine were also generated.  Comparison: 07/30/2011  CT HEAD  Findings: Atherosclerotic and physiologic intracranial calcifications.  Mild atrophy. There is no evidence of acute intracranial hemorrhage, brain edema, mass lesion, acute infarction,   mass effect, or midline shift. Acute infarct may be inapparent on noncontrast CT.  No other intra-axial abnormalities are seen, and the ventricles and sulci are within normal limits in size and symmetry.   No abnormal extra-axial fluid collections or masses are identified.  No significant calvarial abnormality.  IMPRESSION: 1. Negative for bleed or other acute intracranial process.  CT CERVICAL SPINE  Findings: Normal alignment.  No prevertebral soft tissue swelling. Vertebral body and intervertebral disc height well maintained throughout.  Negative for fracture.  No significant osseous degenerative change.  Patient motion degrades reconstructions. Partially calcified bilateral carotid bifurcation plaque.  IMPRESSION:  1.  Negative for fracture or other acute bony abnormality. 2.  Bilateral carotid bifurcation plaque.  Original Report Authenticated By: D. Andria Rhein, MD    US Abdomen Complete  04/29/2012  *RADIOLOGY REPORT*  Clinical Data:  History of abnormal liver function test.  History of previous cholecystectomy.  ABDOMINAL ULTRASOUND COMPLETE  Comparison:  CT 04/29/2012.  Ultrasound 05/03/2010.  Findings:  Gallbladder: There is history of prior cholecystectomy.  No gallbladder is evident.  CBD: Normal in caliber post cholecystectomy measuring 8.4 mm. No choledocholithiasis is evident.  Liver:  Normal size with increased echogenicity of the hepatic parenchymal echotexture without focal parenchymal abnormality. Portal vein is patent with hepatopetal flow.  IVC:  Patent throughout  its visualized course in the abdomen.  Pancreas:  Although the pancreas is difficult to visualize in its entirety, no focal pancreatic abnormality is identified.  Spleen:  Normal size and echotexture without focal abnormality. Length is 6 cm.  Right kidney:  No hydronephrosis.  Well-preserved cortex.  Normal parenchymal echotexture without focal abnormalities.  Right renal length is 12.4 cm.  Left kidney:  No hydronephrosis.  Well-preserved cortex.  Normal parenchymal echotexture without focal abnormalities.  Left renal length is 12.1 cm.  Aorta:  Maximum diameter is 2.8 cm.  No aneurysm is evident. There is some atherosclerotic plaquing.  Ascites:  None.  IMPRESSION: Post cholecystectomy.  Normal appearance of bile ducts post cholecystectomy.  Increased echogenicity of hepatic parenchymal echotexture.  Most commonly this is associated with fatty infiltration of the liver but is not specific for it.  On previous CT there was decreased attenuation of the liver compared to spleen consistent with element of fatty infiltration.  Atherosclerotic plaquing of abdominal aorta without evidence of aneurysm.   Original Report Authenticated By: Onalee Hua Call    Dg Abd Acute W/chest  04/28/2012  *RADIOLOGY REPORT*  Clinical Data: Abdominal pain.  Nausea and vomiting.  ACUTE ABDOMEN SERIES (ABDOMEN 2 VIEW & CHEST 1 VIEW)  Comparison: One-view chest to 11/10/2011.  CT abdomen pelvis 10/01/2009.  Findings: The heart size is exaggerated by low lung volumes.  The lungs are clear.  Supine and upright views of the abdomen demonstrate nonspecific bowel gas pattern.  There is no evidence for obstruction or free air.  Surgical clips are present in the right upper quadrant. Phleboliths are noted in the anatomic pelvis.  Degenerative changes are present in the lower lumbar spine.  IMPRESSION:  1.  No acute abnormality of the chest or abdomen.   Original Report Authenticated By: Marin Roberts, M.D.       Medications: I have  reviewed the patient's current medications.  Impression: 1. Nausea and vomiting and diarrhea, picture of viral gastroenteritis. 2. Elevated liver enzymes, previously documented to have hepatitis C. Probable alcohol abuse. 3. Microcytosis, previously documented to have vitamin B 12 deficiency I believe. 4. Abnormal CT scan, unclear significance. Her abdomen appears to be benign on examination. 5. Hypokalemia. Likely secondary to diarrhea. 6. Possible alcohol withdrawal syndrome. 7. Escherichia coli UTI.      Plan: 1. Replete potassium IV. 2. Continue with intravenous Rocephin. 3. I will not increase any opioid analgesia for this patient at this time.      LOS: 2 days   Wilson Singer Pager [phone]  04/30/2012, 10:02 AM

## 2012-04-30 NOTE — Progress Notes (Signed)
Patients IV infiltrated while receiving IV K+ runs.  R FA slightly red and swollen.  IV removed and new one started.  Heat applied to infiltration site.  Will continue to monitor.

## 2012-05-01 DIAGNOSIS — F1010 Alcohol abuse, uncomplicated: Secondary | ICD-10-CM

## 2012-05-01 DIAGNOSIS — R569 Unspecified convulsions: Secondary | ICD-10-CM

## 2012-05-01 DIAGNOSIS — F10239 Alcohol dependence with withdrawal, unspecified: Secondary | ICD-10-CM

## 2012-05-01 LAB — COMPREHENSIVE METABOLIC PANEL
Albumin: 3.2 g/dL — ABNORMAL LOW (ref 3.5–5.2)
Alkaline Phosphatase: 173 U/L — ABNORMAL HIGH (ref 39–117)
BUN: 4 mg/dL — ABNORMAL LOW (ref 6–23)
Chloride: 95 mEq/L — ABNORMAL LOW (ref 96–112)
Potassium: 3.2 mEq/L — ABNORMAL LOW (ref 3.5–5.1)
Total Bilirubin: 0.6 mg/dL (ref 0.3–1.2)

## 2012-05-01 LAB — MRSA PCR SCREENING: MRSA by PCR: NEGATIVE

## 2012-05-01 LAB — MAGNESIUM: Magnesium: 1.7 mg/dL (ref 1.5–2.5)

## 2012-05-01 LAB — CBC
MCH: 40 pg — ABNORMAL HIGH (ref 26.0–34.0)
MCH: 40.5 pg — ABNORMAL HIGH (ref 26.0–34.0)
MCHC: 34.5 g/dL (ref 30.0–36.0)
MCV: 117.4 fL — ABNORMAL HIGH (ref 78.0–100.0)
Platelets: 78 10*3/uL — ABNORMAL LOW (ref 150–400)
Platelets: 82 10*3/uL — ABNORMAL LOW (ref 150–400)
RBC: 2.52 MIL/uL — ABNORMAL LOW (ref 3.87–5.11)
RDW: 13.3 % (ref 11.5–15.5)
RDW: 13.5 % (ref 11.5–15.5)
WBC: 4 10*3/uL (ref 4.0–10.5)

## 2012-05-01 LAB — BASIC METABOLIC PANEL
BUN: 5 mg/dL — ABNORMAL LOW (ref 6–23)
Calcium: 8 mg/dL — ABNORMAL LOW (ref 8.4–10.5)
GFR calc non Af Amer: >90 mL/min (ref >90)
Glucose, Bld: 83 mg/dL (ref 70–99)
Sodium: 134 mEq/L — ABNORMAL LOW (ref 135–145)

## 2012-05-01 LAB — TROPONIN I: Troponin I: <0.3 ng/mL (ref <0.30)

## 2012-05-01 MED ORDER — POTASSIUM CHLORIDE CRYS ER 20 MEQ PO TBCR: 20.0000 meq | EXTENDED_RELEASE_TABLET | Freq: Two times a day (BID) | ORAL | Status: DC

## 2012-05-01 MED ORDER — FOLIC ACID 1 MG PO TABS: 1.0000 mg | ORAL_TABLET | Freq: Every day | ORAL | Status: DC

## 2012-05-01 MED ORDER — MORPHINE SULFATE 15 MG PO TABS: 15.0000 mg | ORAL_TABLET | ORAL | Status: DC | PRN

## 2012-05-01 MED ORDER — POTASSIUM CHLORIDE IN NACL 40-0.9 MEQ/L-% IV SOLN
INTRAVENOUS | Status: DC
  Administered 2012-05-01: via INTRAVENOUS

## 2012-05-01 MED ORDER — LORAZEPAM 1 MG PO TABS: 1.0000 mg | ORAL_TABLET | Freq: Three times a day (TID) | ORAL | Status: DC

## 2012-05-01 MED ORDER — ADULT MULTIVITAMIN W/MINERALS CH: 1.0000 | ORAL_TABLET | Freq: Every day | ORAL | Status: DC

## 2012-05-01 MED ORDER — PROMETHAZINE HCL 12.5 MG PO TABS: 12.5000 mg | ORAL_TABLET | Freq: Four times a day (QID) | ORAL | Status: DC | PRN

## 2012-05-01 MED ORDER — LORAZEPAM 2 MG/ML IJ SOLN
2.0000 mg | Freq: Once | INTRAMUSCULAR | Status: AC
  Administered 2012-05-01: 2 mg via INTRAVENOUS

## 2012-05-01 MED ORDER — ACETAMINOPHEN 10 MG/ML IV SOLN
INTRAVENOUS | Status: AC
  Filled 2012-05-01: qty 200

## 2012-05-01 MED ORDER — LEVOTHYROXINE SODIUM 75 MCG PO TABS: 75.0000 ug | ORAL_TABLET | Freq: Every morning | ORAL | Status: DC

## 2012-05-01 MED ORDER — THIAMINE HCL 100 MG PO TABS: 100.0000 mg | ORAL_TABLET | Freq: Every day | ORAL | Status: DC

## 2012-05-01 NOTE — Discharge Summary (Signed)
Physician Discharge Summary  [name] JWJ:[id number] DOB: 1960-09-08 DOA: 04/28/2012  PCP: Ernestine Conrad, MD  Admit date: 04/28/2012 Discharge date: 05/01/2012  Time spent: Greater than 30 minutes  Recommendations for Outpatient Follow-up:  1. Follow with new primary care physician, apparently this is Dr. Delbert Harness.  Discharge Diagnoses:  1. Escherichia coli UTI. 2. Alcohol withdrawal syndrome. 3. Possible drug seeking behavior.   Discharge Condition: Stable and improved.  Diet recommendation: Regular. No alcohol.  Filed Weights   04/28/12 1537 04/28/12 2244 05/01/12 0100  Weight: 77.111 kg (170 lb) 86.9 kg (191 lb 9.3 oz) 90 kg (198 lb 6.6 oz)    History of present illness:  This 52 year old lady presents to the hospital with symptoms of abdominal pain, nausea and vomiting. Please see initial history as outlined below, HPI: [name] is a 52 y.o. female with a past medical history significant for Guillain-Barr syndrome, bipolar disease, and arthritis who presents to the emergency department for the second time this week complaining of generalized body aching, upper respiratory symptoms including cough and congestion, abdominal pain with profuse diarrhea every hour which she describes as watery, posttussive emesis versus vomiting, poor by mouth intake, and worsening fatigue and weakness. She came into the ED this evening because instead of her symptoms getting better they have been progressively getting worse she was previously diagnosed with a viral syndrome and discharged from the emergency department 2 days ago. Admission is requested today based on her abnormal laboratory findings of severe hypokalemia at 2.4, dehydration, abdominal pain and hematuria/proteinuria.  Hospital Course:  Patient was admitted this initially for gastroenteritis, however there is no further diarrhea seen and no vomiting seen in the hospital. C. difficile toxin was negative. During her hospitalization  she had symptoms and signs probably of alcohol withdrawal. She did give a history of increased alcohol intake. She also had somewhat unexplained symptomatology without any hard findings. Urine culture did show growth of Escherichia coli which was appropriately treated with intravenous Rocephin. She Requesting Pain Medicines, Phenergan and Ativan throughout Her Hospitalization. She Is Now Medically Stable for Discharge.  Procedures:  None.   Consultations:  None.  Discharge Exam: Filed Vitals:   05/01/12 0300 05/01/12 0400 05/01/12 0500 05/01/12 0600  BP: 137/89  111/67 97/56  Pulse: 100     Temp:  101 F (38.3 C)    TempSrc:  Oral    Resp: 21 24 21 18   Height:      Weight:      SpO2:        General: She looks systemically well. She is alert and orientated. Cardiovascular:  Heart murmurs. She is in sinus rhythm.sounds are present without Respiratory:  lung fields show some mild wheezing which I think is her baseline secondary to her tobacco abuse. She is alert and orientated. There are no signs whatsoever at this time of alcohol withdrawal. She is not delirious. There are no focal neurological signs.   Discharge Instructions  Discharge Orders    Future Orders Please Complete By Expires   Diet - low sodium heart healthy      Increase activity slowly          Medication List     As of 05/01/2012  8:19 AM    STOP taking these medications         ibuprofen 200 MG tablet   Commonly known as: ADVIL,MOTRIN      TAKE these medications  cyanocobalamin 1000 MCG/ML injection   Commonly known as: (VITAMIN B-12)   Inject 1,000 mcg into the muscle once a week. Sunday      diclofenac sodium 1 % Gel   Commonly known as: VOLTAREN   Apply 1 application topically 4 (four) times daily as needed. For neuropathy pains      folic acid 1 MG tablet   Commonly known as: FOLVITE   Take 1 tablet (1 mg total) by mouth daily.      levothyroxine 75 MCG tablet   Commonly known as:  SYNTHROID, LEVOTHROID   Take 1 tablet (75 mcg total) by mouth every morning.      LORazepam 1 MG tablet   Commonly known as: ATIVAN   Take 1 tablet (1 mg total) by mouth every 8 (eight) hours.      morphine 15 MG tablet   Commonly known as: MSIR   Take 1 tablet (15 mg total) by mouth every 4 (four) hours as needed for pain.      multivitamin with minerals Tabs   Take 1 tablet by mouth daily.      potassium chloride SA 20 MEQ tablet   Commonly known as: K-DUR,KLOR-CON   Take 1 tablet (20 mEq total) by mouth 2 (two) times daily.      pregabalin 100 MG capsule   Commonly known as: LYRICA   Take 100 mg by mouth 3 (three) times daily.      promethazine 12.5 MG tablet   Commonly known as: PHENERGAN   Take 1 tablet (12.5 mg total) by mouth every 6 (six) hours as needed for nausea.      thiamine 100 MG tablet   Take 1 tablet (100 mg total) by mouth daily.          The results of significant diagnostics from this hospitalization (including imaging, microbiology, ancillary and laboratory) are listed below for reference.    Significant Diagnostic Studies: Ct Abdomen Pelvis Wo Contrast  04/29/2012  *RADIOLOGY REPORT*  Clinical Data: Hematuria.  Abnormal liver function tests. Abdominal pain.  Diarrhea.  Vomiting.  The patient refused intravenous contrast material due to history of multiple allergies and fear of reaction.  CT ABDOMEN AND PELVIS WITHOUT CONTRAST  Technique:  Multidetector CT imaging of the abdomen and pelvis was performed following the standard protocol without intravenous contrast.  Comparison: 10/01/2009  Findings: Lung bases are clear.  Surgical absence of the gallbladder.  Diffuse low attenuation change in the liver suggesting diffuse fatty infiltration.  Normal spleen size. No adrenal gland nodules.  Scattered calcifications in the abdominal aorta without aneurysm.  There is hazy infiltration in the retroperitoneal fat and pericolic gutters with small lymph nodes  suggesting inflammatory process.  The pancreas is atrophic. No significant mesenteric lymphadenopathy.  The kidneys appear symmetrical in size and shape.  No pyelocaliectasis or ureterectasis.  No renal, ureteral, or bladder stones.  Calcified phleboliths in the pelvis.  No bladder wall thickening.  The stomach, small bowel, and colon are decompressed.  Decompression of the colon limits evaluation for wall thickening.  No free fluid or free air in the abdomen.  Pelvis:  The uterus and adnexal structures are not enlarged.  No free or loculated pelvic fluid collections.  No significant pelvic lymphadenopathy.  The appendix is normal.  No evidence of diverticulitis.  Degenerative changes in the lumbar spine. Focal gas in the subcutaneous tissues of the right lower quadrant likely represent injection site.  IMPRESSION: Infiltration in the retroperitoneal fat and  pericolic gutters with small lymph nodes present.  Changes suggest inflammatory process. Consider retroperitoneal fibrosis or fat necrosis.  Colitis, bilateral pyelonephritis, duodenitis or pancreatitis could potentially give this appearance, although no specific abnormality of these organs is identified.   Original Report Authenticated By: Burman Nieves, M.D.    Ct Head Wo Contrast  04/29/2012  *RADIOLOGY REPORT*  Clinical Data:  Fall, Seizure, confusion while on inpatient floor,seizure.  CT HEAD WITHOUT CONTRAST CT CERVICAL SPINE WITHOUT CONTRAST  Technique:  Multidetector CT imaging of the head and cervical spine was performed following the standard protocol without IV contrast. Multiplanar CT image reconstructions of the cervical spine were also generated.  Comparison: 07/30/2011  CT HEAD  Findings: Atherosclerotic and physiologic intracranial calcifications.  Mild atrophy. There is no evidence of acute intracranial hemorrhage, brain edema, mass lesion, acute infarction,   mass effect, or midline shift. Acute infarct may be inapparent on noncontrast CT.   No other intra-axial abnormalities are seen, and the ventricles and sulci are within normal limits in size and symmetry.   No abnormal extra-axial fluid collections or masses are identified.  No significant calvarial abnormality.  IMPRESSION: 1. Negative for bleed or other acute intracranial process.  CT CERVICAL SPINE  Findings: Normal alignment.  No prevertebral soft tissue swelling. Vertebral body and intervertebral disc height well maintained throughout.  Negative for fracture.  No significant osseous degenerative change.  Patient motion degrades reconstructions. Partially calcified bilateral carotid bifurcation plaque.  IMPRESSION:  1.  Negative for fracture or other acute bony abnormality. 2.  Bilateral carotid bifurcation plaque.   Original Report Authenticated By: D. Andria Rhein, MD    Ct Cervical Spine Wo Contrast  04/29/2012  *RADIOLOGY REPORT*  Clinical Data:  Fall, Seizure, confusion while on inpatient floor,seizure.  CT HEAD WITHOUT CONTRAST CT CERVICAL SPINE WITHOUT CONTRAST  Technique:  Multidetector CT imaging of the head and cervical spine was performed following the standard protocol without IV contrast. Multiplanar CT image reconstructions of the cervical spine were also generated.  Comparison: 07/30/2011  CT HEAD  Findings: Atherosclerotic and physiologic intracranial calcifications.  Mild atrophy. There is no evidence of acute intracranial hemorrhage, brain edema, mass lesion, acute infarction,   mass effect, or midline shift. Acute infarct may be inapparent on noncontrast CT.  No other intra-axial abnormalities are seen, and the ventricles and sulci are within normal limits in size and symmetry.   No abnormal extra-axial fluid collections or masses are identified.  No significant calvarial abnormality.  IMPRESSION: 1. Negative for bleed or other acute intracranial process.  CT CERVICAL SPINE  Findings: Normal alignment.  No prevertebral soft tissue swelling. Vertebral body and  intervertebral disc height well maintained throughout.  Negative for fracture.  No significant osseous degenerative change.  Patient motion degrades reconstructions. Partially calcified bilateral carotid bifurcation plaque.  IMPRESSION:  1.  Negative for fracture or other acute bony abnormality. 2.  Bilateral carotid bifurcation plaque.   Original Report Authenticated By: D. Andria Rhein, MD    US Abdomen Complete  04/29/2012  *RADIOLOGY REPORT*  Clinical Data:  History of abnormal liver function test.  History of previous cholecystectomy.  ABDOMINAL ULTRASOUND COMPLETE  Comparison:  CT 04/29/2012.  Ultrasound 05/03/2010.  Findings:  Gallbladder: There is history of prior cholecystectomy.  No gallbladder is evident.  CBD: Normal in caliber post cholecystectomy measuring 8.4 mm. No choledocholithiasis is evident.  Liver:  Normal size with increased echogenicity of the hepatic parenchymal echotexture without focal parenchymal abnormality. Portal vein is patent with  hepatopetal flow.  IVC:  Patent throughout its visualized course in the abdomen.  Pancreas:  Although the pancreas is difficult to visualize in its entirety, no focal pancreatic abnormality is identified.  Spleen:  Normal size and echotexture without focal abnormality. Length is 6 cm.  Right kidney:  No hydronephrosis.  Well-preserved cortex.  Normal parenchymal echotexture without focal abnormalities.  Right renal length is 12.4 cm.  Left kidney:  No hydronephrosis.  Well-preserved cortex.  Normal parenchymal echotexture without focal abnormalities.  Left renal length is 12.1 cm.  Aorta:  Maximum diameter is 2.8 cm.  No aneurysm is evident. There is some atherosclerotic plaquing.  Ascites:  None.  IMPRESSION: Post cholecystectomy.  Normal appearance of bile ducts post cholecystectomy.  Increased echogenicity of hepatic parenchymal echotexture.  Most commonly this is associated with fatty infiltration of the liver but is not specific for it.  On previous  CT there was decreased attenuation of the liver compared to spleen consistent with element of fatty infiltration.  Atherosclerotic plaquing of abdominal aorta without evidence of aneurysm.   Original Report Authenticated By: Onalee Hua Call    Dg Chest Port 1 View  04/30/2012  *RADIOLOGY REPORT*  Clinical Data: Cough, congestion, and wheezing.  PORTABLE CHEST - 1 VIEW  Comparison: 04/28/2012  Findings: Borderline heart size with normal pulmonary vascularity. No focal airspace consolidation in the lungs.  No blunting of costophrenic angles.  No pneumothorax.  Mediastinal contours appear intact.  No significant changes since the previous study.  IMPRESSION: No evidence of active pulmonary disease.   Original Report Authenticated By: Burman Nieves, M.D.    Dg Abd Acute W/chest  04/28/2012  *RADIOLOGY REPORT*  Clinical Data: Abdominal pain.  Nausea and vomiting.  ACUTE ABDOMEN SERIES (ABDOMEN 2 VIEW & CHEST 1 VIEW)  Comparison: One-view chest to 11/10/2011.  CT abdomen pelvis 10/01/2009.  Findings: The heart size is exaggerated by low lung volumes.  The lungs are clear.  Supine and upright views of the abdomen demonstrate nonspecific bowel gas pattern.  There is no evidence for obstruction or free air.  Surgical clips are present in the right upper quadrant. Phleboliths are noted in the anatomic pelvis.  Degenerative changes are present in the lower lumbar spine.  IMPRESSION:  1.  No acute abnormality of the chest or abdomen.   Original Report Authenticated By: Marin Roberts, M.D.     Microbiology: Recent Results (from the past 240 hour(s))  URINE CULTURE     Status: Normal   Collection Time   04/28/12  5:30 PM      Component Value Range Status Comment   Specimen Description URINE, CATHETERIZED   Final    Special Requests NONE   Final    Culture  Setup Time 04/29/2012 01:32   Final    Colony Count >=100,000 COLONIES/ML   Final    Culture ESCHERICHIA COLI   Final    Report Status 04/30/2012 FINAL    Final    Organism ID, Bacteria ESCHERICHIA COLI   Final   CLOSTRIDIUM DIFFICILE BY PCR     Status: Normal   Collection Time   04/29/12  2:58 AM      Component Value Range Status Comment   C difficile by pcr NEGATIVE  NEGATIVE Final   CULTURE, BLOOD (ROUTINE X 2)     Status: Normal (Preliminary result)   Collection Time   05/01/12 12:15 AM      Component Value Range Status Comment   Specimen Description BLOOD LEFT ANTECUBITAL  Final    Special Requests BOTTLES DRAWN AEROBIC AND ANAEROBIC 5CC   Final    Culture PENDING   Incomplete    Report Status PENDING   Incomplete   MRSA PCR SCREENING     Status: Normal   Collection Time   05/01/12  1:19 AM      Component Value Range Status Comment   MRSA by PCR NEGATIVE  NEGATIVE Final      Labs: Basic Metabolic Panel:  Lab 05/01/12 7829 04/30/12 2338 04/30/12 0339 04/29/12 2115 04/29/12 2114 04/29/12 0423 04/28/12 2158  NA 132* 134* 137 -- 136 141 --  K 3.2* 3.4* 2.7* -- 2.4* 2.6* --  CL 95* 96 97 -- 96 96 --  CO2 28 29 34* -- 32 35* --  GLUCOSE 101* 83 78 -- 87 84 --  BUN 4* 5* 7 -- 11 15 --  CREATININE 0.39* 0.39* 0.41* -- 0.42* 0.44* --  CALCIUM 7.7* 8.0* 7.7* -- 7.7* 7.8* --  MG -- 1.7 1.8 -- -- -- 1.2*  PHOS -- -- 4.0 1.9* -- -- 2.3   Liver Function Tests:  Lab 05/01/12 0449 04/30/12 0339 04/29/12 0423 04/28/12 1554  AST 50* 33 36 44*  ALT 15 13 17 23   ALKPHOS 173* 189* 234* 303*  BILITOT 0.6 1.0 2.0* 2.0*  PROT 6.6 6.4 6.8 8.0  ALBUMIN 3.2* 3.1* 3.3* 3.9    Lab 04/28/12 1554  LIPASE 5*  AMYLASE --    CBC:  Lab 05/01/12 0449 04/30/12 2338 04/30/12 0339 04/29/12 0423 04/28/12 2158 04/28/12 1554 04/25/12 1239  WBC 4.0 4.3 5.5 9.4 9.7 -- --  NEUTROABS -- -- -- -- -- 7.7 2.6  HGB 9.4* 10.2* 9.9* 11.2* 13.0 -- --  HCT 27.6* 29.6* 28.8* 32.8* 37.3 -- --  MCV 117.4* 117.5* 117.1* 117.1* 116.6* -- --  PLT 78* 82* 89* 103* 125* -- --   Cardiac Enzymes:  Lab 04/30/12 2338 04/29/12 2115  CKTOTAL -- --  CKMB -- --    CKMBINDEX -- --  TROPONINI <0.30 <0.30    CBG:  Lab 04/29/12 2143 04/29/12 1626 04/29/12 1201 04/29/12 0754  GLUCAP 76 66* 81 86       Signed:  Zana Biancardi C  [facility] 05/01/2012, 8:19 AM

## 2012-05-01 NOTE — Progress Notes (Signed)
Reviewed discharge instructions with client, instructed on medications, and medication schedule. Prescriptions given. Client voiced understanding to discharge instructions and medications. Client discharged to home in stable condition. Clients husband escorting client home.

## 2012-05-02 LAB — STOOL CULTURE

## 2012-05-03 MED FILL — Medication: Qty: 1 | Status: AC

## 2012-05-06 LAB — CULTURE, BLOOD (ROUTINE X 2): Culture: NO GROWTH

## 2012-11-01 ENCOUNTER — Encounter (HOSPITAL_COMMUNITY): Payer: Self-pay

## 2012-11-01 ENCOUNTER — Inpatient Hospital Stay (HOSPITAL_COMMUNITY): Payer: Medicare Other

## 2012-11-01 ENCOUNTER — Other Ambulatory Visit: Payer: Self-pay

## 2012-11-01 ENCOUNTER — Inpatient Hospital Stay (HOSPITAL_COMMUNITY)
Admission: EM | Admit: 2012-11-01 | Discharge: 2012-11-03 | DRG: 392 | Disposition: A | Discharge status: Home / Self Care | Payer: Medicare Other | Attending: Family Medicine | Admitting: Family Medicine

## 2012-11-01 DIAGNOSIS — F3289 Other specified depressive episodes: Secondary | ICD-10-CM | POA: Diagnosis present

## 2012-11-01 DIAGNOSIS — E538 Deficiency of other specified B group vitamins: Secondary | ICD-10-CM | POA: Diagnosis present

## 2012-11-01 DIAGNOSIS — R945 Abnormal results of liver function studies: Secondary | ICD-10-CM

## 2012-11-01 DIAGNOSIS — E039 Hypothyroidism, unspecified: Secondary | ICD-10-CM | POA: Diagnosis present

## 2012-11-01 DIAGNOSIS — R112 Nausea with vomiting, unspecified: Secondary | ICD-10-CM | POA: Diagnosis present

## 2012-11-01 DIAGNOSIS — E872 Acidosis, unspecified: Secondary | ICD-10-CM | POA: Diagnosis present

## 2012-11-01 DIAGNOSIS — G8929 Other chronic pain: Secondary | ICD-10-CM | POA: Diagnosis present

## 2012-11-01 DIAGNOSIS — E874 Mixed disorder of acid-base balance: Secondary | ICD-10-CM | POA: Diagnosis present

## 2012-11-01 DIAGNOSIS — E079 Disorder of thyroid, unspecified: Secondary | ICD-10-CM

## 2012-11-01 DIAGNOSIS — G894 Chronic pain syndrome: Secondary | ICD-10-CM | POA: Diagnosis present

## 2012-11-01 DIAGNOSIS — R739 Hyperglycemia, unspecified: Secondary | ICD-10-CM

## 2012-11-01 DIAGNOSIS — D7589 Other specified diseases of blood and blood-forming organs: Secondary | ICD-10-CM

## 2012-11-01 DIAGNOSIS — B1920 Unspecified viral hepatitis C without hepatic coma: Secondary | ICD-10-CM | POA: Diagnosis present

## 2012-11-01 DIAGNOSIS — F329 Major depressive disorder, single episode, unspecified: Secondary | ICD-10-CM | POA: Diagnosis present

## 2012-11-01 DIAGNOSIS — E860 Dehydration: Secondary | ICD-10-CM | POA: Diagnosis present

## 2012-11-01 DIAGNOSIS — R1110 Vomiting, unspecified: Secondary | ICD-10-CM

## 2012-11-01 DIAGNOSIS — A498 Other bacterial infections of unspecified site: Secondary | ICD-10-CM | POA: Diagnosis present

## 2012-11-01 DIAGNOSIS — G40909 Epilepsy, unspecified, not intractable, without status epilepticus: Secondary | ICD-10-CM

## 2012-11-01 DIAGNOSIS — D696 Thrombocytopenia, unspecified: Secondary | ICD-10-CM

## 2012-11-01 DIAGNOSIS — M51379 Other intervertebral disc degeneration, lumbosacral region without mention of lumbar back pain or lower extremity pain: Secondary | ICD-10-CM | POA: Diagnosis present

## 2012-11-01 DIAGNOSIS — M129 Arthropathy, unspecified: Secondary | ICD-10-CM | POA: Diagnosis present

## 2012-11-01 DIAGNOSIS — F1010 Alcohol abuse, uncomplicated: Secondary | ICD-10-CM | POA: Diagnosis present

## 2012-11-01 DIAGNOSIS — K219 Gastro-esophageal reflux disease without esophagitis: Secondary | ICD-10-CM | POA: Diagnosis present

## 2012-11-01 DIAGNOSIS — E669 Obesity, unspecified: Secondary | ICD-10-CM | POA: Diagnosis present

## 2012-11-01 DIAGNOSIS — F411 Generalized anxiety disorder: Secondary | ICD-10-CM | POA: Diagnosis present

## 2012-11-01 DIAGNOSIS — R9431 Abnormal electrocardiogram [ECG] [EKG]: Secondary | ICD-10-CM | POA: Diagnosis present

## 2012-11-01 DIAGNOSIS — D649 Anemia, unspecified: Secondary | ICD-10-CM | POA: Diagnosis present

## 2012-11-01 DIAGNOSIS — R197 Diarrhea, unspecified: Secondary | ICD-10-CM

## 2012-11-01 DIAGNOSIS — Z79899 Other long term (current) drug therapy: Secondary | ICD-10-CM

## 2012-11-01 DIAGNOSIS — A088 Other specified intestinal infections: Principal | ICD-10-CM | POA: Diagnosis present

## 2012-11-01 DIAGNOSIS — F17200 Nicotine dependence, unspecified, uncomplicated: Secondary | ICD-10-CM | POA: Diagnosis present

## 2012-11-01 DIAGNOSIS — F419 Anxiety disorder, unspecified: Secondary | ICD-10-CM | POA: Diagnosis present

## 2012-11-01 DIAGNOSIS — R0902 Hypoxemia: Secondary | ICD-10-CM

## 2012-11-01 DIAGNOSIS — R809 Proteinuria, unspecified: Secondary | ICD-10-CM

## 2012-11-01 DIAGNOSIS — E876 Hypokalemia: Secondary | ICD-10-CM | POA: Diagnosis present

## 2012-11-01 DIAGNOSIS — H543 Unqualified visual loss, both eyes: Secondary | ICD-10-CM | POA: Diagnosis present

## 2012-11-01 DIAGNOSIS — R748 Abnormal levels of other serum enzymes: Secondary | ICD-10-CM | POA: Diagnosis present

## 2012-11-01 DIAGNOSIS — N390 Urinary tract infection, site not specified: Secondary | ICD-10-CM | POA: Diagnosis present

## 2012-11-01 DIAGNOSIS — K7689 Other specified diseases of liver: Secondary | ICD-10-CM

## 2012-11-01 DIAGNOSIS — R319 Hematuria, unspecified: Secondary | ICD-10-CM

## 2012-11-01 DIAGNOSIS — G610 Guillain-Barre syndrome: Secondary | ICD-10-CM | POA: Diagnosis present

## 2012-11-01 DIAGNOSIS — Z720 Tobacco use: Secondary | ICD-10-CM | POA: Diagnosis present

## 2012-11-01 DIAGNOSIS — D529 Folate deficiency anemia, unspecified: Secondary | ICD-10-CM | POA: Diagnosis present

## 2012-11-01 DIAGNOSIS — R109 Unspecified abdominal pain: Secondary | ICD-10-CM

## 2012-11-01 DIAGNOSIS — M5137 Other intervertebral disc degeneration, lumbosacral region: Secondary | ICD-10-CM | POA: Diagnosis present

## 2012-11-01 DIAGNOSIS — R7401 Elevation of levels of liver transaminase levels: Secondary | ICD-10-CM | POA: Diagnosis present

## 2012-11-01 DIAGNOSIS — R21 Rash and other nonspecific skin eruption: Secondary | ICD-10-CM

## 2012-11-01 LAB — COMPREHENSIVE METABOLIC PANEL
ALT: 14 U/L (ref 0–35)
AST: 49 U/L — ABNORMAL HIGH (ref 0–37)
Calcium: 9.2 mg/dL (ref 8.4–10.5)
Sodium: 137 mEq/L (ref 135–145)
Total Protein: 8 g/dL (ref 6.0–8.3)

## 2012-11-01 LAB — RETICULOCYTES
RBC.: 3.02 MIL/uL — ABNORMAL LOW (ref 3.87–5.11)
Retic Count, Absolute: 12.1 10*3/uL — ABNORMAL LOW (ref 19.0–186.0)
Retic Ct Pct: 0.4 % (ref 0.4–3.1)

## 2012-11-01 LAB — TROPONIN I
Troponin I: <0.3 ng/mL (ref <0.30)
Troponin I: <0.3 ng/mL (ref <0.30)
Troponin I: <0.3 ng/mL (ref <0.30)

## 2012-11-01 LAB — CBC WITH DIFFERENTIAL/PLATELET
Basophils Relative: 0 % (ref 0–1)
Eosinophils Absolute: 0 10*3/uL (ref 0.0–0.7)
HCT: 32.4 % — ABNORMAL LOW (ref 36.0–46.0)
Hemoglobin: 11.5 g/dL — ABNORMAL LOW (ref 12.0–15.0)
Lymphs Abs: 1.4 10*3/uL (ref 0.7–4.0)
MCH: 37.7 pg — ABNORMAL HIGH (ref 26.0–34.0)
MCHC: 35.5 g/dL (ref 30.0–36.0)
Monocytes Absolute: 0.3 10*3/uL (ref 0.1–1.0)
Monocytes Relative: 4 % (ref 3–12)
Neutro Abs: 5.8 10*3/uL (ref 1.7–7.7)
RBC: 3.05 MIL/uL — ABNORMAL LOW (ref 3.87–5.11)

## 2012-11-01 LAB — BASIC METABOLIC PANEL
CO2: 35 mEq/L — ABNORMAL HIGH (ref 19–32)
Calcium: 7.7 mg/dL — ABNORMAL LOW (ref 8.4–10.5)
Calcium: 7.8 mg/dL — ABNORMAL LOW (ref 8.4–10.5)
Creatinine, Ser: 0.53 mg/dL (ref 0.50–1.10)
GFR calc Af Amer: >90 mL/min (ref >90)
GFR calc Af Amer: >90 mL/min (ref >90)
GFR calc non Af Amer: >90 mL/min (ref >90)
GFR calc non Af Amer: >90 mL/min (ref >90)
Glucose, Bld: 92 mg/dL (ref 70–99)
Potassium: 3.1 mEq/L — ABNORMAL LOW (ref 3.5–5.1)
Sodium: 137 mEq/L (ref 135–145)
Sodium: 135 mEq/L (ref 135–145)

## 2012-11-01 LAB — URINALYSIS, ROUTINE W REFLEX MICROSCOPIC
Bilirubin Urine: NEGATIVE
Nitrite: NEGATIVE
Protein, ur: NEGATIVE mg/dL
Urobilinogen, UA: 1 mg/dL (ref 0.0–1.0)

## 2012-11-01 LAB — URINE MICROSCOPIC-ADD ON

## 2012-11-01 LAB — IRON AND TIBC
Saturation Ratios: 94 % — ABNORMAL HIGH (ref 20–55)
UIBC: 15 ug/dL — ABNORMAL LOW (ref 125–400)

## 2012-11-01 LAB — VITAMIN B12: Vitamin B-12: 958 pg/mL — ABNORMAL HIGH (ref 211–911)

## 2012-11-01 MED ORDER — PREGABALIN 50 MG PO CAPS
100.0000 mg | ORAL_CAPSULE | Freq: Three times a day (TID) | ORAL | Status: DC
  Administered 2012-11-01 – 2012-11-03 (×3): 100 mg via ORAL
  Filled 2012-11-01 (×3): qty 2

## 2012-11-01 MED ORDER — POTASSIUM CHLORIDE 10 MEQ/100ML IV SOLN
10.0000 meq | INTRAVENOUS | Status: AC
  Administered 2012-11-01 (×3): 10 meq via INTRAVENOUS
  Filled 2012-11-01: qty 300

## 2012-11-01 MED ORDER — SODIUM CHLORIDE 0.9 % IJ SOLN
3.0000 mL | Freq: Two times a day (BID) | INTRAMUSCULAR | Status: DC
  Administered 2012-11-01: 3 mL via INTRAVENOUS
  Filled 2012-11-01: qty 3

## 2012-11-01 MED ORDER — POTASSIUM CHLORIDE IN NACL 20-0.9 MEQ/L-% IV SOLN
INTRAVENOUS | Status: DC
  Administered 2012-11-01: 10:00:00 via INTRAVENOUS
  Filled 2012-11-01: qty 1000

## 2012-11-01 MED ORDER — POTASSIUM CHLORIDE CRYS ER 20 MEQ PO TBCR
40.0000 meq | EXTENDED_RELEASE_TABLET | Freq: Once | ORAL | Status: AC
  Administered 2012-11-01: 40 meq via ORAL
  Filled 2012-11-01: qty 2

## 2012-11-01 MED ORDER — MORPHINE SULFATE 4 MG/ML IJ SOLN
4.0000 mg | Freq: Once | INTRAMUSCULAR | Status: AC
  Administered 2012-11-01: 4 mg via INTRAVENOUS
  Filled 2012-11-01: qty 1

## 2012-11-01 MED ORDER — MAGNESIUM SULFATE 40 MG/ML IJ SOLN
2.0000 g | Freq: Once | INTRAMUSCULAR | Status: DC
  Filled 2012-11-01: qty 50

## 2012-11-01 MED ORDER — ONDANSETRON HCL 4 MG PO TABS
4.0000 mg | ORAL_TABLET | Freq: Four times a day (QID) | ORAL | Status: DC | PRN
  Administered 2012-11-02: 4 mg via ORAL
  Filled 2012-11-01: qty 1

## 2012-11-01 MED ORDER — SODIUM CHLORIDE 0.9 % IV BOLUS (SEPSIS)
1000.0000 mL | Freq: Once | INTRAVENOUS | Status: AC
  Administered 2012-11-01: 1000 mL via INTRAVENOUS

## 2012-11-01 MED ORDER — POTASSIUM CHLORIDE 10 MEQ/100ML IV SOLN
10.0000 meq | INTRAVENOUS | Status: AC
  Administered 2012-11-01 (×5): 10 meq via INTRAVENOUS
  Filled 2012-11-01 (×5): qty 100

## 2012-11-01 MED ORDER — TRAZODONE HCL 50 MG PO TABS
25.0000 mg | ORAL_TABLET | Freq: Every evening | ORAL | Status: DC | PRN
  Administered 2012-11-01: 25 mg via ORAL
  Filled 2012-11-01: qty 2
  Filled 2012-11-01: qty 1

## 2012-11-01 MED ORDER — PROMETHAZINE HCL 25 MG/ML IJ SOLN
12.5000 mg | Freq: Once | INTRAMUSCULAR | Status: AC
  Administered 2012-11-01: 12.5 mg via INTRAVENOUS
  Filled 2012-11-01: qty 1

## 2012-11-01 MED ORDER — FOLIC ACID 5 MG/ML IJ SOLN
1.0000 mg | Freq: Once | INTRAMUSCULAR | Status: AC
  Administered 2012-11-01: 1 mg via INTRAVENOUS
  Filled 2012-11-01: qty 0.2

## 2012-11-01 MED ORDER — ALUM & MAG HYDROXIDE-SIMETH 200-200-20 MG/5ML PO SUSP: 30.0000 mL | Freq: Four times a day (QID) | ORAL | Status: DC | PRN

## 2012-11-01 MED ORDER — POTASSIUM CHLORIDE IN NACL 40-0.9 MEQ/L-% IV SOLN
INTRAVENOUS | Status: DC
  Administered 2012-11-01 – 2012-11-02 (×3): via INTRAVENOUS

## 2012-11-01 MED ORDER — FOLIC ACID 5 MG/ML IJ SOLN
INTRAMUSCULAR | Status: AC
  Filled 2012-11-01: qty 0.2

## 2012-11-01 MED ORDER — ONDANSETRON HCL 4 MG/2ML IJ SOLN
4.0000 mg | Freq: Four times a day (QID) | INTRAMUSCULAR | Status: DC | PRN
  Administered 2012-11-01 (×2): 4 mg via INTRAVENOUS
  Filled 2012-11-01 (×2): qty 2

## 2012-11-01 MED ORDER — ENOXAPARIN SODIUM 40 MG/0.4ML ~~LOC~~ SOLN
40.0000 mg | SUBCUTANEOUS | Status: DC
  Administered 2012-11-01 – 2012-11-02 (×2): 40 mg via SUBCUTANEOUS
  Filled 2012-11-01 (×2): qty 0.4

## 2012-11-01 MED ORDER — POTASSIUM CHLORIDE 10 MEQ/100ML IV SOLN
10.0000 meq | Freq: Once | INTRAVENOUS | Status: AC
  Administered 2012-11-01: 10 meq via INTRAVENOUS
  Filled 2012-11-01: qty 100

## 2012-11-01 MED ORDER — HYDROMORPHONE HCL PF 1 MG/ML IJ SOLN
0.5000 mg | INTRAMUSCULAR | Status: DC | PRN
  Administered 2012-11-01 – 2012-11-02 (×5): 0.5 mg via INTRAVENOUS
  Filled 2012-11-01 (×5): qty 1

## 2012-11-01 MED ORDER — THIAMINE HCL 100 MG/ML IJ SOLN
100.0000 mg | Freq: Every day | INTRAMUSCULAR | Status: DC
  Filled 2012-11-01: qty 2

## 2012-11-01 NOTE — ED Notes (Signed)
Patient ambulatory to restroom. No distress.

## 2012-11-01 NOTE — ED Provider Notes (Signed)
History    CSN: [id number] Arrival date & time 11/01/12  0507  First MD Initiated Contact with Patient 11/01/12 0457     Chief Complaint  Patient presents with  . Emesis    Patient is a 52 y.o. female presenting with vomiting. The history is provided by the patient.  Emesis Severity:  Moderate Duration:  7 days Timing:  Intermittent Progression:  Worsening Chronicity:  New Relieved by:  Nothing Worsened by:  Liquids Associated symptoms: abdominal pain, chills, diarrhea and myalgias    Past Medical History  Diagnosis Date  . Guillain-Barre   . Blind   . Neuropathy   . Thyroid disease   . Back pain   . Arthritis   . Seizures   . UTI (lower urinary tract infection)   . Anxiety   . Depression   . DDD (degenerative disc disease), lumbar    Past Surgical History  Procedure Laterality Date  . Cholecystectomy    . Tubal ligation    . Femur fracture surgery    . Ankle surgery    . Back surgery     History reviewed. No pertinent family history. History  Substance Use Topics  . Smoking status: Current Every Day Smoker -- 0.50 packs/day    Types: Cigarettes  . Smokeless tobacco: Not on file  . Alcohol Use: No   OB History   Grav Para Term Preterm Abortions TAB SAB Ect Mult Living                 Review of Systems  Constitutional: Positive for chills.  Cardiovascular: Positive for chest pain.       Chest pain with vomiting   Gastrointestinal: Positive for vomiting, abdominal pain and diarrhea.       Dark stools   Musculoskeletal: Positive for myalgias and back pain.  Skin: Negative for color change.  Neurological: Positive for weakness.  All other systems reviewed and are negative.    Allergies  Neurontin; Acetaminophen; Eggs or egg-derived products; and Codeine  Home Medications   Current Outpatient Rx  Name  Route  Sig  Dispense  Refill  . cyanocobalamin (,VITAMIN B-12,) 1000 MCG/ML injection   Intramuscular   Inject 1,000 mcg into the muscle  once a week. Sunday         . diclofenac sodium (VOLTAREN) 1 % GEL   Topical   Apply 1 application topically 4 (four) times daily as needed. For neuropathy pains         . folic acid (FOLVITE) 1 MG tablet   Oral   Take 1 tablet (1 mg total) by mouth daily.   30 tablet   0   . levothyroxine (SYNTHROID, LEVOTHROID) 75 MCG tablet   Oral   Take 1 tablet (75 mcg total) by mouth every morning.   30 tablet   0   . LORazepam (ATIVAN) 1 MG tablet   Oral   Take 1 tablet (1 mg total) by mouth every 8 (eight) hours.   30 tablet   0   . morphine (MSIR) 15 MG tablet   Oral   Take 1 tablet (15 mg total) by mouth every 4 (four) hours as needed for pain.   30 tablet   0   . Multiple Vitamin (MULTIVITAMIN WITH MINERALS) TABS   Oral   Take 1 tablet by mouth daily.   30 tablet   0   . potassium chloride SA (K-DUR,KLOR-CON) 20 MEQ tablet   Oral   Take  1 tablet (20 mEq total) by mouth 2 (two) times daily.   60 tablet   0   . pregabalin (LYRICA) 100 MG capsule   Oral   Take 100 mg by mouth 3 (three) times daily.          . promethazine (PHENERGAN) 12.5 MG tablet   Oral   Take 1 tablet (12.5 mg total) by mouth every 6 (six) hours as needed for nausea.   30 tablet   0   . thiamine 100 MG tablet   Oral   Take 1 tablet (100 mg total) by mouth daily.   30 tablet   0    BP 110/80  Pulse 89  Temp(Src) 98.6 F (37 C) (Oral)  Resp 16  Ht 5\' 4"  (1.626 m)  Wt 170 lb (77.111 kg)  BMI 29.17 kg/m2  SpO2 100% Physical Exam CONSTITUTIONAL: obese, mildly anxious HEAD: Normocephalic/atraumatic ENMT: Mucous membranes dry NECK: supple no meningeal signs SPINE:entire spine nontender CV: S1/S2 noted, no murmurs/rubs/gallops noted LUNGS: Lungs are clear to auscultation bilaterally, no apparent distress ABDOMEN: soft, nontender, no rebound or guarding GU:no cva tenderness Rectal - stool color brown.  No melena or blood noted NEURO: Pt is awake/alert, moves all  extremitiesx4 EXTREMITIES: pulses normal, full ROM SKIN: warm, color normal She has two fentanyl patches on her back PSYCH: mildly anxious  ED Course  Procedures  Labs Reviewed  COMPREHENSIVE METABOLIC PANEL  CBC WITH DIFFERENTIAL  LIPASE, BLOOD  ETHANOL   6:28 AM Pt here with vomiting/diarrhea for a week  She reports she feels weak and has "total body pain" Her abdominal exam is benign She does have abnormal LFTs, though similar to prior Her EKG indicates prolonged QTc, and this is likely due to severe hypoKalemia noted on labs She will require admission for IV fluids and potassium replenishment D/w [facility] Rito Ehrlich, will admit to tele Pt updated on plan  MDM  Nursing notes including past medical history and social history reviewed and considered in documentation Labs/vital reviewed and considered Previous records reviewed and considered - admission January 2014 for vomiting/diarrhea/hypoKalemia.  Reported etoh abuse but pt denies this.     Date: 11/01/2012  Rate: 91  Rhythm: normal sinus rhythm  QRS Axis: normal  Intervals: QT prolonged  ST/T Wave abnormalities: nonspecific ST changes and u waves noted  Conduction Disutrbances:none  Narrative Interpretation:   Old EKG Reviewed: changes noted QTc prolonged on today's EKG    Joya Gaskins, MD 11/01/12 0630

## 2012-11-01 NOTE — ED Notes (Signed)
Notified by lab of K+ 2 and CO2 40.  EDP aware.

## 2012-11-01 NOTE — ED Notes (Signed)
Magnesium level WNL. Dr Bebe Shaggy consulted about giving magnesium IV as ordered prior to level. Order to hold magnesium at this time.

## 2012-11-01 NOTE — ED Notes (Signed)
Report given to Armanda Magic, RN. Ready to receive patient.

## 2012-11-01 NOTE — ED Notes (Signed)
Attempted to call report. RN to call back. 

## 2012-11-01 NOTE — ED Notes (Signed)
Patient requesting pain and nausea medication. Informed patient that she was not able to have either yet. No vomiting no dry heaves. Dilaudid could be repeated at 1245 and Zofran at 1545. Patient gave verbal understanding.

## 2012-11-01 NOTE — ED Notes (Signed)
Nausea,vomiting and diarrhea for one week. Total body pain 10/10. Chest pain for 24 hours, onset while vomiting yesterday.also c/o hands cramping.

## 2012-11-01 NOTE — H&P (Signed)
[facility] History and Physical  RUTHIE BERCH WUJ:[id number] DOB: 1960/12/04 DOA: 11/01/2012  Referring physician:  PCP: Ernestine Conrad, MD  Specialists:   Chief Complaint: Persistent nausea vomiting diarrhea and abdominal pain  HPI: [name] is a 52 y.o. female with a past medical history significant for Katheran Awe syndrome arthritis, seizure disorder, thyroid disease, chronic pain syndrome, EtOH abuse, tobacco abuse who presents to the emergency department with the chief complaint of persistent nausea vomiting, abdominal pain and diarrhea over the last 9 days. Information is obtained from the patient. She states that 9 days ago she developed sudden nausea and vomiting with abdominal pain upon awakening. She states prior this time she was in her usual state of health. At initial morning she reports emesis 3 times clear in nature. She denies any coffee ground emesis. Shortly thereafter she developed diarrhea. She describes her stool as watery and brown. She reports she had 3 episodes of watery stool that day. She states that her emesis and diarrhea improved but nausea persisted to the point that she was unable to keep any food or liquids down. She states she has been unable to take her medications as well. At the end of the day she was feeling a little better and tried some food and reports prompt emesis thereafter. She describes the same cycle for the last 9 days specifically on awakening with severe nausea brief emesis and diarrhea feeling better as the day wore on and then attempting to take food and liquid causing the nausea and vomiting to resume. She states each day she's had 2-3 episodes of emesis and 2-3 episodes of liquid stool. During this time she describes an constant "nagging" achiness throughout her abdomen but particularly in her left lower quadrant. Associated symptoms include chills and fever of 102 orally each night, and severe acid reflux. She states that she took extra  strength Maalox for 2 days with some brief relief. She denies any dysuria hematuria frequency or urgency. She states that at  3 AM this morning she was having difficulty sleeping and developed some shortness of breath with chest pain that she attributed to anxiety over prolonged illness and inability to sleep. She decided to come to the hospital to "get something to help her sleep". Lab work in the emergency room is significant for potassium of 2.0, CO2 40, glucose 104, alkaline phosphatase 155, AST 39, hemoglobin 11.5 with MCV 106.2. EKG yields a normal sinus rhythm. Left anterior fascicular block,ST & T wave abnormality, consider anterior ischemia. Prolonged QT.Abnormal ECG When compared with ECG of 30-Apr-2012 23:09. Symptoms came on suddenly have have persisted and characterized as moderate. [facility] are asked to admit.  Review of Systems: The patient denies  weight loss,, decreased hearing, hoarseness,  syncope, dyspnea on exertion, peripheral edema, balance deficits, hemoptysis,  melena, hematochezia,  hematuria, incontinence, genital sores, muscle weakness, suspicious skin lesions,  difficulty walking,  unusual weight change, abnormal bleeding, enlarged lymph nodes, angioedema, and breast masses.    Past Medical History  Diagnosis Date  . Guillain-Barre   . Blind   . Neuropathy   . Thyroid disease   . Back pain   . Arthritis   . Seizures   . UTI (lower urinary tract infection)   . Anxiety   . Depression   . DDD (degenerative disc disease), lumbar    Past Surgical History  Procedure Laterality Date  . Cholecystectomy    . Tubal ligation    . Femur fracture surgery    .  Ankle surgery    . Back surgery     Social History:  reports that she has been smoking Cigarettes.  She has been smoking about 0.50 packs per day. She does not have any smokeless tobacco history on file. She reports that she does not drink alcohol or use illicit drugs. Patient lives at home with her  husband. She is unemployed as she is on disability. He is fairly independent with ADLs. Reports her last EtOH consumption was 6 months ago Allergies  Allergen Reactions  . Neurontin (Gabapentin) Anaphylaxis  . Acetaminophen Itching and Nausea And Vomiting  . Eggs Or Egg-Derived Products Nausea And Vomiting  . Codeine Itching and Nausea And Vomiting    Patient is able to take morphine, demerol and fentanyl with out any type of side effects    History reviewed. No pertinent family history. patient states mother's past medical history significant for hypertension. She is unaware of medical history of any other family members  Prior to Admission medications   Medication Sig Start Date End Date Taking? Authorizing Provider  cyanocobalamin (,VITAMIN B-12,) 1000 MCG/ML injection Inject 1,000 mcg into the muscle once a week. Sunday   Yes Historical Provider, MD  diclofenac sodium (VOLTAREN) 1 % GEL Apply 1 application topically 4 (four) times daily as needed. For neuropathy pains   Yes Historical Provider, MD  levothyroxine (SYNTHROID, LEVOTHROID) 75 MCG tablet Take 1 tablet (75 mcg total) by mouth every morning. 05/01/12  Yes Nimish Normajean Glasgow, MD  potassium chloride SA (K-DUR,KLOR-CON) 20 MEQ tablet Take 1 tablet (20 mEq total) by mouth 2 (two) times daily. 05/01/12  Yes Nimish Normajean Glasgow, MD  pregabalin (LYRICA) 100 MG capsule Take 100 mg by mouth 3 (three) times daily.    Yes Historical Provider, MD  promethazine (PHENERGAN) 12.5 MG tablet Take 1 tablet (12.5 mg total) by mouth every 6 (six) hours as needed for nausea. 05/01/12  Yes Nimish Normajean Glasgow, MD  thiamine 100 MG tablet Take 1 tablet (100 mg total) by mouth daily. 05/01/12  Yes Wilson Singer, MD   Physical Exam: Filed Vitals:   11/01/12 0503 11/01/12 0704 11/01/12 0713 11/01/12 0850  BP: 110/80  127/91 132/95  Pulse: 89  77 91  Temp: 98.6 F (37 C)     TempSrc: Oral     Resp: 16 17 18 20   Height: 5\' 4"  (1.626 m)     Weight: 170 lb  (77.111 kg)     SpO2: 100%  98% 97%     General:  Obese, alert. She appears in no acute distress but somewhat anxious  Eyes: PE RRL, EOMI, no scleral icterus  ENT: Ears clear, nose without drainage. Her oropharynx is without erythema or exudate. Mucous membranes of her mouth pink and dry  Neck: Supple without JVD. There is full range of motion and no lymphadenopathy  Cardiovascular: Regular rate and rhythm without murmur gallop or rub. Her lower extremities exhibit no edema.  Respiratory: Respiratory effort is normal. She has diffuse expiratory faint wheezes. Breath sounds without rhonchi or crackles.  Abdomen: Her abdomen is obese but soft. There are normal bowel sounds throughout 4 quadrants. She exhibits mild tenderness diffusely but particularly in the lower quadrants. There is no guarding or rebound.  Skin: Her skin is warm and dry. There are no rashes or lesions.  Musculoskeletal: She moves all extremities. There is no clubbing or cyanosis.  Psychiatric: Somewhat anxious but cooperative.  Neurologic: Cranial nerves II through XII grossly intact. Her  speech is clear. She exhibits facial symmetry.  Labs on Admission:  Basic Metabolic Panel:  Recent Labs Lab 11/01/12 0514 11/01/12 0620  NA 137  --   K 2.0*  --   CL 86*  --   CO2 40*  --   GLUCOSE 104*  --   BUN 12  --   CREATININE 0.58  --   CALCIUM 9.2  --   MG  --  2.1   Liver Function Tests:  Recent Labs Lab 11/01/12 0514  AST 49*  ALT 14  ALKPHOS 155*  BILITOT 2.8*  PROT 8.0  ALBUMIN 3.6    Recent Labs Lab 11/01/12 0514  LIPASE 7*   No results found for this basename: AMMONIA,  in the last 168 hours CBC:  Recent Labs Lab 11/01/12 0514  WBC 7.5  NEUTROABS 5.8  HGB 11.5*  HCT 32.4*  MCV 106.2*  PLT 151   Cardiac Enzymes: No results found for this basename: CKTOTAL, CKMB, CKMBINDEX, TROPONINI,  in the last 168 hours  BNP (last 3 results) No results found for this basename: PROBNP,  in  the last 8760 hours CBG: No results found for this basename: GLUCAP,  in the last 168 hours  Radiological Exams on Admission: No results found.  EKG: EKG yields a normal sinus rhythm. Left anterior fascicular block,ST & T wave abnormality, consider anterior ischemia. Prolonged QT.Abnormal ECG   Assessment/Plan Principal Problem: Nausea vomiting and diarrhea/Abdominal pain: Etiology uncertain but sounds like possible viral gastroenteritis. Will admit to telemetry. Will provide anti-emetic in the form of Zofran. Will provide clear liquids. Will check stool for C. difficile and GI pathogen panel. We'll provide pain medicine as well. Of note last month patient was provided with antibiotic for a urinary tract infection however she did not complete the course. I will get a urinalysis for completeness as he also has a history of Escherichia coli UTI her last hospitalization at the beginning of this year. Her lipase was 7. If no improvement will consider abdominal CT. Currently the patient is afebrile and hemodynamically stable.    Hypokalemia: Most likely related to #1. Patient did receive 40 mEq by mouth in the emergency room but shortly thereafter had an episode of vomiting. Continue her 5 runs of potassium as well as maintenance IV fluids with 20 of KCl added. If unable to tolerate will provide PICC line. Her magnesium level is within the limits of normal Will check a be met every 6 hours. Active Problems:   Dehydration: Do to GI losses from #1. Will support with intravenous normal saline. Will monitor her intake and output. Will get serial bmet's.     Metabolic alkalosis: Mild secondary to #1. Provide intravenous normal saline with KCl. Will check bmet every 6 hours x2 and then again in the morning. We'll correct as results indicate.    Abnormal EKG: Widening QT segment since last admission. Possibly secondary to hypokalemia or related to Phenergan use? Will admit to telemetry for close monitoring.  Will hold Phenergan.  Chest pain: No history of CAD. Patient pain free at my exam. She indicates she believes related to anxiety. Will cycle cardiac enzymes and monitor on telemetry. Will repeat EKG in the morning.    Anemia: Macrocytic. Chart review indicates patient with history of same. Chart review indicates six-month ago B12 elevated and folate level low. Patient's home medications include weekly vitamin B12 injection and daily thiamine tablet. Will hold for now due to #1. Will recheck anemia panel. Chart  review indicates her baseline range between 9 and 11. Currently her hemoglobin is over 11 but I suspect this is related to dehydration. Will check CBC in the a.m. and will expect a slight decrease in her hemoglobin. Will monitor for FOBT.    Hypothyroid: Will check TSH. Will provide Synthroid IV.    Folate deficiency: History of same. Will check anemia panel. Anemia panel noted for a total iron of 227, TIBC of 242, ferritin of 1499, folate of 1.1, and vitamin B12 of 958. Folate deficiency is likely contributing to macrocytosis. History of EtOH abuse as well. Reports no EtOH for 6 months.    Seizure disorder: Patient reports no seizure activity and 7 months. Will monitor closely.     Chronic pain: Patient reports pain "from head to toe constantly". History does include neuropathy. She is on Lyrica. Will hold for now second to #1. Will provide IV pain medicine as indicated.    Anxiety: History of same. Appears to be at baseline. She was requesting something "for nerves and sleep at discharge"    Tobacco abuse: Counseled regarding smoking cessation. Examination revealed mild wheezes likely related to her tobacco use. We'll order an albuterol inhaler. Will get a chest x-ray for completeness. Oxygen saturation level is over 90% on room air.  Obesity: Will request nutritional once acute phase of illness is over and patient is tolerating a diet.  Elevated transaminase: Chart review indicates  history of hepatitis C therefore suspect this is somewhat chronic. Currently her AST level is the same as it was 6 months ago and for alkaline phosphatase level is actually better than it was 6 months ago.   Code Status: Full Family Communication: None at bedside Disposition Plan: Home when ready. Hopefully 2 days or so  Time spent: 75 minutes  Gwenyth Bender [facility] Pager [phone]  If 7PM-7AM, please contact night-coverage [email or web address] Password [email or web address] 11/01/2012, 8:57 AM     Attending:  The patient was seen and examined. She was discussed with NP, Ms. black. Agree with findings except for above noted changes. The patient's GI symptoms appear to be related to an acute gastroenteritis, although, C. difficile colitis will need to be ruled out given her recent antibiotic treatment for UTI. Her serum potassium has improved from 2.0 to 3.0. We'll continue IV potassium chloride runs and supplementation in the maintenance IV fluids. It is likely that her prolonged QT interval is secondary to hypokalemia. Her magnesium level is within normal limits. We'll need to recheck her EKG following complete repletion of her potassium. We'll give the patient 1 dose of IV folate for folate deficiency as her serum folate is low at 1.1, then start oral folate once she is taking medications without nausea vomiting. Her TSH is within normal limits.  Elliot Cousin M.D. [phone]

## 2012-11-01 NOTE — ED Notes (Signed)
Pt ambulated to restroom with 2 staff assistance. When pt returned to room she became very dizzy with a unsteady gait.

## 2012-11-01 NOTE — ED Notes (Signed)
Admitting NP at bedside

## 2012-11-02 DIAGNOSIS — D7589 Other specified diseases of blood and blood-forming organs: Secondary | ICD-10-CM

## 2012-11-02 DIAGNOSIS — R112 Nausea with vomiting, unspecified: Secondary | ICD-10-CM

## 2012-11-02 DIAGNOSIS — N390 Urinary tract infection, site not specified: Secondary | ICD-10-CM | POA: Diagnosis present

## 2012-11-02 LAB — BASIC METABOLIC PANEL
BUN: 7 mg/dL (ref 6–23)
Calcium: 8.2 mg/dL — ABNORMAL LOW (ref 8.4–10.5)
Chloride: 98 mEq/L (ref 96–112)
Creatinine, Ser: 0.49 mg/dL — ABNORMAL LOW (ref 0.50–1.10)
GFR calc Af Amer: >90 mL/min (ref >90)

## 2012-11-02 LAB — COMPREHENSIVE METABOLIC PANEL
ALT: 10 U/L (ref 0–35)
AST: 39 U/L — ABNORMAL HIGH (ref 0–37)
Albumin: 3 g/dL — ABNORMAL LOW (ref 3.5–5.2)
Alkaline Phosphatase: 122 U/L — ABNORMAL HIGH (ref 39–117)
Calcium: 8 mg/dL — ABNORMAL LOW (ref 8.4–10.5)
GFR calc Af Amer: >90 mL/min (ref >90)
Potassium: 3.2 mEq/L — ABNORMAL LOW (ref 3.5–5.1)
Sodium: 139 mEq/L (ref 135–145)
Total Protein: 6.7 g/dL (ref 6.0–8.3)

## 2012-11-02 LAB — CBC
HCT: 27.2 % — ABNORMAL LOW (ref 36.0–46.0)
Hemoglobin: 9.2 g/dL — ABNORMAL LOW (ref 12.0–15.0)
MCH: 36.7 pg — ABNORMAL HIGH (ref 26.0–34.0)
MCHC: 33.8 g/dL (ref 30.0–36.0)
MCV: 108.4 fL — ABNORMAL HIGH (ref 78.0–100.0)
RDW: 14.1 % (ref 11.5–15.5)

## 2012-11-02 MED ORDER — HYDROMORPHONE HCL PF 1 MG/ML IJ SOLN: 0.5000 mg | INTRAMUSCULAR | Status: DC | PRN

## 2012-11-02 MED ORDER — DEXTROSE 5 % IV SOLN
1.0000 g | Freq: Every day | INTRAVENOUS | Status: DC
  Administered 2012-11-02 – 2012-11-03 (×2): 1 g via INTRAVENOUS
  Filled 2012-11-02 (×3): qty 10

## 2012-11-02 MED ORDER — POTASSIUM CHLORIDE 10 MEQ/100ML IV SOLN
10.0000 meq | INTRAVENOUS | Status: AC
  Administered 2012-11-02 (×3): 10 meq via INTRAVENOUS
  Filled 2012-11-02 (×3): qty 100

## 2012-11-02 MED ORDER — LEVOTHYROXINE SODIUM 75 MCG PO TABS
75.0000 ug | ORAL_TABLET | Freq: Every day | ORAL | Status: DC
  Administered 2012-11-03: 75 ug via ORAL
  Filled 2012-11-02: qty 1

## 2012-11-02 MED ORDER — POTASSIUM CHLORIDE CRYS ER 20 MEQ PO TBCR
20.0000 meq | EXTENDED_RELEASE_TABLET | Freq: Two times a day (BID) | ORAL | Status: DC
  Administered 2012-11-02 – 2012-11-03 (×3): 20 meq via ORAL
  Filled 2012-11-02 (×3): qty 1

## 2012-11-02 MED ORDER — DEXTROSE 5 % IV SOLN
INTRAVENOUS | Status: AC
  Filled 2012-11-02: qty 10

## 2012-11-02 NOTE — Progress Notes (Signed)
UR chart review completed.  

## 2012-11-02 NOTE — Care Management Note (Signed)
    Page 1 of 1   11/03/2012     10:16:27 AM   CARE MANAGEMENT NOTE 11/03/2012  Patient:  [name], [name]   Account Number:  [id number]  Date Initiated:  11/02/2012  Documentation initiated by:  Sharrie Rothman  Subjective/Objective Assessment:   Pt admitted from home with dehydration, and UTI. Pt lives with her fiance and will return home at discharge. Pt stated that she does not need a cane or walker at this time. Pt has requested someone to come in and help her with cooking and     Action/Plan:   cleaning. CM explained to pt that was a service that we could not provide. CM offered HH but pt declined at this time. Cm did offer list of private duty care agencies that she could contact for that service if needed. Will continue to follo.   Anticipated DC Date:  11/04/2012   Anticipated DC Plan:  HOME/SELF CARE      DC Planning Services  CM consult      Choice offered to / List presented to:             Status of service:  Completed, signed off Medicare Important Message given?  NA - LOS <3 / Initial given by admissions (If response is "NO", the following Medicare IM given date fields will be blank) Date Medicare IM given:   Date Additional Medicare IM given:    Discharge Disposition:  HOME/SELF CARE  Per UR Regulation:    If discussed at Long Length of Stay Meetings, dates discussed:    Comments:  11/02/12 1435 Arlyss Queen, RN BSN CM

## 2012-11-02 NOTE — Progress Notes (Signed)
[facility] PROGRESS NOTE  [name]:[id number] DOB: 20-May-1960 DOA: 11/01/2012 PCP: Ernestine Conrad, MD  Assessment/Plan: Nausea vomiting and diarrhea/Abdominal pain: Likely due to viral gastroenteritis. Some improvement this am. No further emesis since yesterday. Complains of continued intermittent nausea but requesting food. I will advance diet to full liquids to see how she tolerates. Continue Zofran. Stool for C. difficile and GI pathogen panel not submitted yet as patient has had no bowel movements since admission.  Of note, last month patient was provided with antibiotic for a urinary tract infection however she did not complete the course. Urinalysis consistent with UTI. Await urine cultures. Pt does have a history of Escherichia coli UTI. Rocephin day #1.   Hypokalemia: Most likely related to #1. Improving. S/P 8 runs of potassium in addition to additive in her maintenance fluids. Will provide 3 more runs of potassium today and will resume her home dose of BID. Recheck bmet this afternoon. Her magnesium level is within the limits of normal    Dehydration: Due to GI losses from #1. Improved. Pt tolerating po fluids and requesting food. I will continue IV fluids but will decrease the rate.    Metabolic alkalosis: Mild secondary to #1. Resolved with intravenous normal saline with KCl.    Abnormal EKG: Widening QT segment since last admission. Possibly secondary to hypokalemia or related to Phenergan use?  No events on tele.   Chest pain: No history of CAD. Patient remains chest pain free. Cardiac enzymes are negative to date.    Anemia: Macrocytic. Chart review indicates patient with history of same. Chart review indicates six-month ago B12 elevated and folate level low. Anemia panel with Vit B12 958, folate 1.1, iron 227, TIBC 242, ferritin 1499. Given one dose IV folate yesterday. Will resume home meds when pt tolerating full liquid diet. Hopefully this  afternoon  Hypothyroid: TSH 1.2. Continue synthroid   Folate deficiency: History of same. . Anemia panel noted for a total iron of 227, TIBC of 242, ferritin of 1499, folate of 1.1, and vitamin B12 of 958. Folate deficiency is likely contributing to macrocytosis. History of EtOH abuse as well. Reports no EtOH for 6 months.   Seizure disorder: Stable.  Patient reports no seizure activity and 7 months. Will monitor closely.   Chronic pain: Patient reports pain "from head to toe constantly". History does include neuropathy. Appears stable at baseline. We will continue pain medicine and resume home Lyrica once tolerating diet.    Anxiety: History of same. Remains at baseline. She did verbalize concern regarding her ability to continue to care for self at home. Stated "i need some help". I will request SW to consult for any referrals for home assistance as appropriate.  She was requesting something "for nerves and sleep at discharge"   Tobacco abuse: Counseled regarding smoking cessation. BS with less wheezing today. We will continue  albuterol inhaler. Chest xray obtained and shows no acute cardiopulmonary disease.   Obesity: BMI 34.7.  Will request nutritional consult.    Elevated transaminase: Chart review indicates history of hepatitis C therefore suspect this is somewhat chronic. Currently her AST level is the same as it was 6 months ago and for alkaline phosphatase level is actually better than it was 6 months ago.   Code Status: full Family Communication:  Disposition Plan: home hopefully tomorrow   Consultants:  none  Procedures:  none  Antibiotics:  Rocephin 11/02/12>>  HPI/Subjective: Awake. Reports continued intermittent nausea but requesting food. Abdominal  pain improved. Continues with chronic pain.  Objective: Filed Vitals:   11/01/12 0850 11/01/12 1045 11/01/12 1100 11/01/12 1415  BP: 132/95 150/108 114/86 132/74  Pulse: 91 75  70  Temp:    98.4 F (36.9 C)   TempSrc:    Oral  Resp: 20 22 17 20   Height:    5\' 4"  (1.626 m)  Weight:    201 lb 11.5 oz (91.5 kg)  SpO2: 97% 100%  98%    Intake/Output Summary (Last 24 hours) at 11/02/12 1049 Last data filed at 11/02/12 0016  Gross per 24 hour  Intake    240 ml  Output    100 ml  Net    140 ml   Filed Weights   11/01/12 0503 11/01/12 1415  Weight: 170 lb (77.111 kg) 201 lb 11.5 oz (91.5 kg)    Exam:   General:  Obese, lying in bed in  NAD  Cardiovascular: S1 and S2. No murmur, no gallup. Lower extremities exhibit no edema  Respiratory: Patient has normal respiratory effort. Breath sounds are without wheeze or rhonchi.   Abdomen: her abdomen is obese but soft. There are positive bowel sounds in all 4 quadrants. She continues to exhibit mild tenderness in lower quadrants. There is no rebound or guarding.   Musculoskeletal: There is no clubbing or cyanosis. Her joints are non-tender and without erythema or swelling.    Data Reviewed: Basic Metabolic Panel:  Recent Labs Lab 11/01/12 0514 11/01/12 0620 11/01/12 1202 11/01/12 2120 11/02/12 0507  NA 137  --  137 135 139  K 2.0*  --  3.0* 3.1* 3.2*  CL 86*  --  95* 96 99  CO2 40*  --  35* 31 29  GLUCOSE 104*  --  88 92 95  BUN 12  --  12 10 8   CREATININE 0.58  --  0.53 0.49* 0.45*  CALCIUM 9.2  --  7.7* 7.8* 8.0*  MG  --  2.1  --   --   --    Liver Function Tests:  Recent Labs Lab 11/01/12 0514 11/02/12 0507  AST 49* 39*  ALT 14 10  ALKPHOS 155* 122*  BILITOT 2.8* 1.3*  PROT 8.0 6.7  ALBUMIN 3.6 3.0*    Recent Labs Lab 11/01/12 0514  LIPASE 7*   No results found for this basename: AMMONIA,  in the last 168 hours CBC:  Recent Labs Lab 11/01/12 0514 11/02/12 0507  WBC 7.5 5.4  NEUTROABS 5.8  --   HGB 11.5* 9.2*  HCT 32.4* 27.2*  MCV 106.2* 108.4*  PLT 151 135*   Cardiac Enzymes:  Recent Labs Lab 11/01/12 0920 11/01/12 1523 11/01/12 2120  TROPONINI <0.30 <0.30 <0.30   BNP (last 3 results) No  results found for this basename: PROBNP,  in the last 8760 hours CBG: No results found for this basename: GLUCAP,  in the last 168 hours  No results found for this or any previous visit (from the past 240 hour(s)).   Studies: Dg Chest Port 1 View  11/01/2012   *RADIOLOGY REPORT*  Clinical Data: Wheezing.  Weakness.  Nausea and vomiting.  PORTABLE CHEST - 1 VIEW  Comparison: Chest x-ray 04/30/2012.  Findings: Lung volumes are normal.  No consolidative airspace disease.  No pleural effusions.  No pneumothorax.  No pulmonary nodule or mass noted.  Pulmonary vasculature and the cardiomediastinal silhouette are within normal limits.  IMPRESSION: 1. No radiographic evidence of acute cardiopulmonary disease.   Original Report Authenticated  By: Trudie Reed, M.D.    Scheduled Meds: . cefTRIAXone (ROCEPHIN)  IV  1 g Intravenous Q0600  . enoxaparin (LOVENOX) injection  40 mg Subcutaneous Q24H  . potassium chloride  10 mEq Intravenous Q1 Hr x 3  . pregabalin  100 mg Oral TID  . sodium chloride  3 mL Intravenous Q12H   Continuous Infusions: . 0.9 % NaCl with KCl 40 mEq / L 100 mL/hr at 11/02/12 9604    Principal Problem:   Hypokalemia Active Problems:   Dehydration   Nausea vomiting and diarrhea   Abdominal pain   Hypothyroid   Folate deficiency   Seizure disorder   Metabolic acidosis   Abnormal EKG   Anemia   Chronic pain   Anxiety   Tobacco abuse   Elevated alkaline phosphatase level   Elevated AST (SGOT)   Obesity   UTI (urinary tract infection)    Time spent: 35 minutes    [facility] M  [facility] Pager [phone]. If 7PM-7AM, please contact night-coverage at [email or web address], password [facility] 11/02/2012, 10:49 AM  LOS: 1 day     Attending: The patient was seen and examined. She was discussed with nurse practitioner, Ms. Vedia Coffer. Agree with exam, assessment, and plan. The patient is improving symptomatically. Continue potassium chloride repletion orally and in the IV  fluids. As her intake continues to improve, we'll transition her to oral potassium supplements only. We'll order PT evaluation. The patient will need to be out of bed to the chair to increase mobility.   Elliot Cousin, M.D. [phone]

## 2012-11-02 NOTE — Progress Notes (Signed)
PT Cancellation Note  Patient Details Name: [name] MRN: [id number] DOB: [birth year]   Cancelled Treatment:    Reason Eval/Treat Not Completed: Patient declined, no reason specified Pt is up, sitting at EOB, with CNA sitting nearby.  Pt immediately declined to do any PT today, "maybe next week if I'm still here".  She appeared to be emotionally upset but would not tell me what was wrong except to say she had some pain.  MD was notified.  We will try tomorrow.  Myrlene Broker L 11/02/2012, 2:55 PM

## 2012-11-02 NOTE — Clinical Social Work Psychosocial (Signed)
Clinical Social Work Department BRIEF PSYCHOSOCIAL ASSESSMENT 11/02/2012  Patient:  [name], [name]     Account Number:  [id number]     Admit date:  11/01/2012  Clinical Social Worker:  Nancie Neas  Date/Time:  11/02/2012 02:30 PM  Referred by:  Physician  Date Referred:  11/02/2012 Referred for  Psychosocial assessment   Other Referral:   Interview type:  Patient Other interview type:    PSYCHOSOCIAL DATA Living Status:  FRIEND(S) Admitted from facility:   Level of care:   Primary support name:  Sam Primary support relationship to patient:  FRIEND Degree of support available:   supportive per pt    CURRENT CONCERNS Current Concerns  None Noted   Other Concerns:    SOCIAL WORK ASSESSMENT / PLAN CSW met with pt at bedside following referral from MD. MD states pt was requesting assistance at home. Pt alert and oriented and irritated with CSW upon entering room. She states this CSW was supposed to see her at 1400 and it was 1430. CSW informed her that a set time had not been made for visit. Pt reports she lives with a friend and is fairly independent with ADLs. She used to have a CAP aid, but about 18 months ago this stopped when she lost Medicaid. Pt is unable to clearly explain why she no longer has Medicaid, but she states she won't be able to qualify now. CSW explained that all private duty care is paid privately and insurance does not cover this. She states she will be fine on her own then, as she cannot afford this. Pt refused home health with CM per note. CM provided private duty care list if needed. Pt states she has transportation.   Assessment/plan status:  Referral to [facility] Other assessment/ plan:   Information/referral to community resources:   [facility] Guide    PATIENT'S/FAMILY'S RESPONSE TO PLAN OF CARE: Pt reports no CSW needs at this time. CSW left [facility] Guide in pt's room. CSW will sign off but can be  reconsulted if needed.       Derenda Fennel, [place] [phone]

## 2012-11-03 LAB — BASIC METABOLIC PANEL
BUN: 5 mg/dL — ABNORMAL LOW (ref 6–23)
CO2: 29 mEq/L (ref 19–32)
Calcium: 8.6 mg/dL (ref 8.4–10.5)
Chloride: 99 mEq/L (ref 96–112)
Creatinine, Ser: 0.42 mg/dL — ABNORMAL LOW (ref 0.50–1.10)

## 2012-11-03 MED ORDER — ONDANSETRON HCL 4 MG PO TABS: 4.0000 mg | ORAL_TABLET | Freq: Three times a day (TID) | ORAL | Status: DC | PRN

## 2012-11-03 MED ORDER — ZOLPIDEM TARTRATE ER 6.25 MG PO TBCR: 12.5000 mg | EXTENDED_RELEASE_TABLET | Freq: Every evening | ORAL | Status: DC | PRN

## 2012-11-03 MED ORDER — CEFUROXIME AXETIL 500 MG PO TABS: 500.0000 mg | ORAL_TABLET | Freq: Two times a day (BID) | ORAL | Status: AC

## 2012-11-03 MED ORDER — PROMETHAZINE HCL 12.5 MG PO TABS: 12.5000 mg | ORAL_TABLET | Freq: Four times a day (QID) | ORAL | Status: DC | PRN

## 2012-11-03 MED ORDER — ZOLPIDEM TARTRATE ER 6.25 MG PO TBCR: 6.2500 mg | EXTENDED_RELEASE_TABLET | Freq: Every evening | ORAL | Status: DC | PRN

## 2012-11-03 MED ORDER — ZOLPIDEM TARTRATE ER 12.5 MG PO TBCR: 12.5000 mg | EXTENDED_RELEASE_TABLET | Freq: Every evening | ORAL | Status: DC | PRN

## 2012-11-03 MED ORDER — CIPROFLOXACIN HCL 250 MG PO TABS: 250.0000 mg | ORAL_TABLET | Freq: Two times a day (BID) | ORAL | Status: DC

## 2012-11-03 NOTE — Progress Notes (Signed)
Physical Therapy Discharge Patient Details Name: [name] MRN: [id number] DOB: 15-Dec-1960 Today's Date: 11/03/2012    Patient discharged from PT services secondary to patient has refused 3 (three) consecutive times without medical reason.  Pt states she has all equipment she needs and has no mobility issues.  Please see latest therapy progress note for current level of functioning and progress toward goals.    Progress and discharge plan discussed with patient and/or caregiver: Patient/Caregiver agrees with plan  GP     RUSSELL,CINDY 11/03/2012, 11:05 AM

## 2012-11-03 NOTE — Progress Notes (Signed)
Pt discharged home today per Dr. Irene Limbo. Pt's IV site d/c'd and WNL. Pt's VS stable at this time. Pt provided with home medication list, discharge instructions and prescriptions. Verbalized understanding. Pt left floor via WC in stable condition accompanied by RN.

## 2012-11-03 NOTE — Progress Notes (Deleted)
Error

## 2012-11-03 NOTE — Discharge Summary (Signed)
Patient seen, independently examined and chart reviewed. I agree with exam, assessment and plan discussed with Toya Smothers, NP. I agree with discharge home.  52 year old woman presented with persistent nausea, vomiting, abdominal pain and diarrhea for 7 or more days with associated poor oral intake. She also reported fever and severe acid reflux. Initial workup was notable for severe hypokalemia with metabolic alkalosis. She was admitted for nausea, vomiting, diarrhea and abdominal pain, consider viral gastroenteritis as well as profound hypokalemia thought to be secondary to vomiting and poor oral intake, dehydration. Also notable was prolonged QT.  She rapidly improved with potassium repletion currently feels well and would like to go home. She appears calm and comfortable. Potassium is normalized and basic metabolic panel is unremarkable. Repeat EKG shows sinus rhythm with decrease in QTC, still prolonged but similar to previous EKGs. No acute changes seen. She has been on Phenergan for a long time. Most likely her prolonged QT was secondary to hypokalemia.   She can be discharged home today, complete treatment for UTI. Followup culture results. Recommend appropriate usage of Phenergan.    [name], MD [facility] [phone]     [name], MD [facility] [phone]

## 2012-11-03 NOTE — Discharge Summary (Signed)
Physician Discharge Summary  [name] WUJ:[id number] DOB: 06/24/1960 DOA: 11/01/2012  PCP: Ernestine Conrad, MD  Admit date: 11/01/2012 Discharge date: 11/03/2012  Time spent: 40 minutes  Recommendations for Outpatient Follow-up:  1. PCP in 1 week for evaluation of symptoms. Recommend BMET for evaluation of potassium level.   Discharge Diagnoses:  Principal Problem:   Hypokalemia Active Problems:   Dehydration   Nausea vomiting and diarrhea   Abdominal pain   Hypothyroid   Folate deficiency   Seizure disorder   Metabolic acidosis   Abnormal EKG   Anemia   Chronic pain   Anxiety   Tobacco abuse   Elevated alkaline phosphatase level   Elevated AST (SGOT)   Obesity   UTI (urinary tract infection)   Discharge Condition: stable  Diet recommendation: regular  Filed Weights   11/01/12 0503 11/01/12 1415  Weight: 170 lb (77.111 kg) 201 lb 11.5 oz (91.5 kg)    History of present illness:  [name] is a 52 y.o. female with a past medical history significant for Katheran Awe syndrome arthritis, seizure disorder, thyroid disease, chronic pain syndrome, EtOH abuse, tobacco abuse who presented to the emergency department on 11/01/12 with the chief complaint of persistent nausea vomiting, abdominal pain and diarrhea over the previous 9 days. Information obtained from the patient. She stated that 9 days prior she developed sudden nausea and vomiting with abdominal pain upon awakening. She stated prior to this time she was in her usual state of health. At initial morning she reported emesis 3 times clear in nature. She denied any coffee ground emesis. Shortly thereafter she developed diarrhea. She described her stool as watery and brown. She reported she had 3 episodes of watery stool that day. She stated that her emesis and diarrhea improved but nausea persisted to the point that she was unable to keep any food or liquids down. She stated she had been unable to take her medications as  well. At the end of the day she was feeling a little better and tried some food and reported prompt emesis thereafter. She described the same cycle for 9 days. She stated each day she'd had 2-3 episodes of emesis and 2-3 episodes of liquid stool. During this time she described a constant "nagging" achiness throughout her abdomen but particularly in her left lower quadrant. Associated symptoms include chills and fever of 102 orally each night, and severe acid reflux. She stated that she took extra strength Maalox for 2 days with some brief relief. She denied any dysuria hematuria frequency or urgency. She stated that at 3 AM the morning of admission she was having difficulty sleeping and developed some shortness of breath with chest pain that she attributed to anxiety over prolonged illness and inability to sleep. She decided to come to the hospital to "get something to help her sleep". Lab work in the emergency room was significant for potassium of 2.0, CO2 40, glucose 104, alkaline phosphatase 155, AST 39, hemoglobin 11.5 with MCV 106.2. EKG yields a normal sinus rhythm. Left anterior fascicular block,ST & T wave abnormality, consider anterior ischemia. Prolonged QT.Abnormal ECG  When compared with ECG of 30-Apr-2012 23:09.   Hospital Course:  Nausea vomiting and diarrhea/Abdominal pain: Likely due to viral gastroenteritis and/or UTI. Admitted to tele and provided with clear liquids.She had no further emesis and her diet advanced on 11/02/12. Stool for cdiff and GI pathogen panel not complete as patient had no stools during hospitalization. She was provided with anti-emetics as  needed. At discharge tolerating regular diet without problem. Of note, last month patient was provided with antibiotic for a urinary tract infection however she did not complete the course. Urinalysis done during this hospitalization consistent with E Coli UTI. She received 2 days of Rocephin and will be discharged with 5 days ceftin.    Hypokalemia: Most likely related to #1. S/P 11 runs of potassium in addition to additive in her maintenance fluids.  At discharge potassium 4.1. Will resume her home dose of BID. Her magnesium level is within the limits of normal. Recommend follow up with PCP 1 week for evaluation of potassium.   Dehydration: Due to GI losses from #1. IV fluids provided. Resolved at discharge. At discharge pt tolerating regular diet without problem   Metabolic alkalosis: Mild secondary to #1. Resolved with intravenous normal saline with KCl.   Abnormal EKG: Widening QT segment since last admission. Possibly secondary to hypokalemia or related to Phenergan use? No events on tele. Repeat EKG on day of discharge yields  Chest pain: No history of CAD. Patient remained chest pain free. Cardiac enzymes negative. .  Anemia: Macrocytic. Chart review indicates patient with history of same. Chart review indicates six-month ago B12 elevated and folate level low. Anemia panel with Vit B12 958, folate 1.1, iron 227, TIBC 242, ferritin 1499. Given one dose IV folate 7/20. Home meds include B-12 injections weekly and thiamin daily.  Will resume home meds.   Hypothyroid: TSH 1.2. Continue synthroid   Folate deficiency: History of same. . Anemia panel noted for a total iron of 227, TIBC of 242, ferritin of 1499, folate of 1.1, and vitamin B12 of 958. Folate deficiency is likely contributing to macrocytosis. History of EtOH abuse as well.   Seizure disorder: Stable. Patient reports no seizure activity and 7 months. Will monitor closely.   Chronic pain: Patient reports pain "from head to toe constantly". History does include neuropathy. Remained stable at baseline.    Anxiety: History of same. Remained at baseline.    Tobacco abuse: Counseled regarding smoking cessation.  Chest xray obtained and showed no acute cardiopulmonary disease.   Obesity: BMI 34.7. Nutritional consult .  Elevated transaminase: Chart review  indicates history of hepatitis C therefore suspect this is somewhat chronic. Currently her AST level is the same as it was 6 months ago and for alkaline phosphatase level is actually better than it was 6 months ago.      Procedures:    Consultations:  none  Discharge Exam: Filed Vitals:   11/01/12 1100 11/01/12 1415 11/02/12 2016 11/03/12 0552  BP: 114/86 132/74 147/94 149/93  Pulse:  70 86 92  Temp:  98.4 F (36.9 C) 98.4 F (36.9 C) 98.7 F (37.1 C)  TempSrc:  Oral Oral Oral  Resp: 17 20 20 20   Height:  5\' 4"  (1.626 m)    Weight:  201 lb 11.5 oz (91.5 kg)    SpO2:  98% 92% 98%    General: obese, sitting in chair. NAD Cardiovascular: RRR No MGR No LE edema Respiratory: normal effort BS clear bilaterally without wheeze or rhonchi  Discharge Instructions      Discharge Orders   Future Orders Complete By Expires     Diet - low sodium heart healthy  As directed     Discharge instructions  As directed     Comments:      Take medications as directed. If symptoms re-occur please seek medical attention See PCP 1 week for evaluation  of symptoms. Recommend BMET to evaluate potassium level    Increase activity slowly  As directed         Medication List         cefUROXime 500 MG tablet  Commonly known as:  CEFTIN  Take 1 tablet (500 mg total) by mouth 2 (two) times daily.     cyanocobalamin 1000 MCG/ML injection  Commonly known as:  (VITAMIN B-12)  Inject 1,000 mcg into the muscle once a week. Sunday     diclofenac sodium 1 % Gel  Commonly known as:  VOLTAREN  Apply 1 application topically 4 (four) times daily as needed. For neuropathy pains     levothyroxine 75 MCG tablet  Commonly known as:  SYNTHROID, LEVOTHROID  Take 1 tablet (75 mcg total) by mouth every morning.     potassium chloride SA 20 MEQ tablet  Commonly known as:  K-DUR,KLOR-CON  Take 1 tablet (20 mEq total) by mouth 2 (two) times daily.     pregabalin 100 MG capsule  Commonly known as:   LYRICA  Take 100 mg by mouth 3 (three) times daily.     promethazine 12.5 MG tablet  Commonly known as:  PHENERGAN  Take 1 tablet (12.5 mg total) by mouth every 6 (six) hours as needed for nausea.     thiamine 100 MG tablet  Take 1 tablet (100 mg total) by mouth daily.     zolpidem 6.25 MG CR tablet  Commonly known as:  AMBIEN CR  Take 1 tablet (6.25 mg total) by mouth at bedtime as needed for sleep.       Allergies  Allergen Reactions  . Neurontin (Gabapentin) Anaphylaxis  . Acetaminophen Itching and Nausea And Vomiting  . Eggs Or Egg-Derived Products Nausea And Vomiting  . Codeine Itching and Nausea And Vomiting    Patient is able to take morphine, demerol and fentanyl with out any type of side effects   Follow-up Information   Follow up with Ernestine Conrad, MD. Schedule an appointment as soon as possible for a visit in 1 week. (evaluation of symptoms)    Contact information:   [facility] [address] [place] [address] [phone]        The results of significant diagnostics from this hospitalization (including imaging, microbiology, ancillary and laboratory) are listed below for reference.    Significant Diagnostic Studies: Dg Chest Port 1 View  11/01/2012   *RADIOLOGY REPORT*  Clinical Data: Wheezing.  Weakness.  Nausea and vomiting.  PORTABLE CHEST - 1 VIEW  Comparison: Chest x-ray 04/30/2012.  Findings: Lung volumes are normal.  No consolidative airspace disease.  No pleural effusions.  No pneumothorax.  No pulmonary nodule or mass noted.  Pulmonary vasculature and the cardiomediastinal silhouette are within normal limits.  IMPRESSION: 1. No radiographic evidence of acute cardiopulmonary disease.   Original Report Authenticated By: Trudie Reed, M.D.    Microbiology: Recent Results (from the past 240 hour(s))  URINE CULTURE     Status: None   Collection Time    11/01/12  6:20 PM      Result Value Range Status   Specimen Description URINE,  CLEAN CATCH   Final   Special Requests NONE   Final   Culture  Setup Time 11/02/2012 03:22   Final   Colony Count >=100,000 COLONIES/ML   Final   Culture ESCHERICHIA COLI   Final   Report Status PENDING   Incomplete     Labs: Basic Metabolic Panel:  Recent Labs Lab 11/01/12 0620 11/01/12 1202 11/01/12 2120 11/02/12 0507 11/02/12 1602 11/03/12 0548  NA  --  137 135 139 136 137  K  --  3.0* 3.1* 3.2* 4.1 4.1  CL  --  95* 96 99 98 99  CO2  --  35* 31 29 31 29   GLUCOSE  --  88 92 95 90 97  BUN  --  12 10 8 7  5*  CREATININE  --  0.53 0.49* 0.45* 0.49* 0.42*  CALCIUM  --  7.7* 7.8* 8.0* 8.2* 8.6  MG 2.1  --   --   --   --   --    Liver Function Tests:  Recent Labs Lab 11/01/12 0514 11/02/12 0507  AST 49* 39*  ALT 14 10  ALKPHOS 155* 122*  BILITOT 2.8* 1.3*  PROT 8.0 6.7  ALBUMIN 3.6 3.0*    Recent Labs Lab 11/01/12 0514  LIPASE 7*    Recent Labs Lab 11/02/12 1441  AMMONIA 59   CBC:  Recent Labs Lab 11/01/12 0514 11/02/12 0507  WBC 7.5 5.4  NEUTROABS 5.8  --   HGB 11.5* 9.2*  HCT 32.4* 27.2*  MCV 106.2* 108.4*  PLT 151 135*   Cardiac Enzymes:  Recent Labs Lab 11/01/12 0920 11/01/12 1523 11/01/12 2120  TROPONINI <0.30 <0.30 <0.30   BNP: BNP (last 3 results) No results found for this basename: PROBNP,  in the last 8760 hours CBG: No results found for this basename: GLUCAP,  in the last 168 hours     Signed:  Gwenyth Bender  [facility] 11/03/2012, 2:21 PM

## 2012-11-04 LAB — URINE CULTURE: Colony Count: >=100000

## 2013-01-17 ENCOUNTER — Emergency Department (HOSPITAL_COMMUNITY)
Admission: EM | Admit: 2013-01-17 | Discharge: 2013-01-17 | Disposition: A | Discharge status: Home / Self Care | Payer: Medicare Other | Attending: Emergency Medicine | Admitting: Emergency Medicine

## 2013-01-17 ENCOUNTER — Encounter (HOSPITAL_COMMUNITY): Payer: Self-pay | Admitting: *Deleted

## 2013-01-17 DIAGNOSIS — Z79899 Other long term (current) drug therapy: Secondary | ICD-10-CM | POA: Insufficient documentation

## 2013-01-17 DIAGNOSIS — F17200 Nicotine dependence, unspecified, uncomplicated: Secondary | ICD-10-CM | POA: Insufficient documentation

## 2013-01-17 DIAGNOSIS — G40909 Epilepsy, unspecified, not intractable, without status epilepticus: Secondary | ICD-10-CM | POA: Insufficient documentation

## 2013-01-17 DIAGNOSIS — E079 Disorder of thyroid, unspecified: Secondary | ICD-10-CM | POA: Insufficient documentation

## 2013-01-17 DIAGNOSIS — N611 Abscess of the breast and nipple: Secondary | ICD-10-CM

## 2013-01-17 DIAGNOSIS — Z87440 Personal history of urinary (tract) infections: Secondary | ICD-10-CM | POA: Insufficient documentation

## 2013-01-17 DIAGNOSIS — G589 Mononeuropathy, unspecified: Secondary | ICD-10-CM | POA: Insufficient documentation

## 2013-01-17 DIAGNOSIS — M129 Arthropathy, unspecified: Secondary | ICD-10-CM | POA: Insufficient documentation

## 2013-01-17 DIAGNOSIS — N61 Mastitis without abscess: Secondary | ICD-10-CM | POA: Insufficient documentation

## 2013-01-17 DIAGNOSIS — Z8659 Personal history of other mental and behavioral disorders: Secondary | ICD-10-CM | POA: Insufficient documentation

## 2013-01-17 LAB — CBC WITH DIFFERENTIAL/PLATELET
Basophils Absolute: 0 10*3/uL (ref 0.0–0.1)
Basophils Relative: 0 % (ref 0–1)
Eosinophils Absolute: 0.1 10*3/uL (ref 0.0–0.7)
Hemoglobin: 12 g/dL (ref 12.0–15.0)
MCH: 41 pg — ABNORMAL HIGH (ref 26.0–34.0)
MCHC: 34.5 g/dL (ref 30.0–36.0)
Monocytes Absolute: 0.6 10*3/uL (ref 0.1–1.0)
Monocytes Relative: 6 % (ref 3–12)
Neutro Abs: 7.1 10*3/uL (ref 1.7–7.7)
Neutrophils Relative %: 71 % (ref 43–77)
RDW: 14.9 % (ref 11.5–15.5)

## 2013-01-17 LAB — BASIC METABOLIC PANEL
BUN: 5 mg/dL — ABNORMAL LOW (ref 6–23)
Creatinine, Ser: 0.41 mg/dL — ABNORMAL LOW (ref 0.50–1.10)
GFR calc Af Amer: >90 mL/min (ref >90)
GFR calc non Af Amer: >90 mL/min (ref >90)
Potassium: 2.9 mEq/L — ABNORMAL LOW (ref 3.5–5.1)

## 2013-01-17 MED ORDER — MORPHINE SULFATE 4 MG/ML IJ SOLN
4.0000 mg | Freq: Once | INTRAMUSCULAR | Status: AC
  Administered 2013-01-17: 4 mg via INTRAVENOUS
  Filled 2013-01-17: qty 1

## 2013-01-17 MED ORDER — CLOTRIMAZOLE 1 % EX CREA: TOPICAL_CREAM | CUTANEOUS | Status: DC

## 2013-01-17 MED ORDER — ONDANSETRON HCL 4 MG/2ML IJ SOLN
4.0000 mg | Freq: Once | INTRAMUSCULAR | Status: AC
  Administered 2013-01-17: 4 mg via INTRAVENOUS
  Filled 2013-01-17: qty 2

## 2013-01-17 MED ORDER — SULFAMETHOXAZOLE-TRIMETHOPRIM 800-160 MG PO TABS: 2.0000 | ORAL_TABLET | Freq: Two times a day (BID) | ORAL | Status: DC

## 2013-01-17 MED ORDER — LIDOCAINE HCL (PF) 2 % IJ SOLN
10.0000 mL | Freq: Once | INTRAMUSCULAR | Status: AC
  Administered 2013-01-17: 10 mL
  Filled 2013-01-17: qty 10

## 2013-01-17 MED ORDER — TRAMADOL HCL 50 MG PO TABS: 50.0000 mg | ORAL_TABLET | Freq: Four times a day (QID) | ORAL | Status: DC | PRN

## 2013-01-17 MED ORDER — HYDROMORPHONE HCL PF 1 MG/ML IJ SOLN
1.0000 mg | Freq: Once | INTRAMUSCULAR | Status: AC
  Administered 2013-01-17: 1 mg via INTRAVENOUS
  Filled 2013-01-17: qty 1

## 2013-01-17 NOTE — ED Notes (Signed)
Large abscess to left abscess x 1 week. States numbness to left arm began yesterday, hurts to move the arm.

## 2013-01-17 NOTE — ED Provider Notes (Signed)
CSN: [id number]     Arrival date & time 01/17/13  1209 History  This chart was scribed for Gilda Crease, MD by Quintella Reichert, ED scribe.  This patient was seen in room APA04/APA04 and the patient's care was started at 1:42 PM.   Chief Complaint  Patient presents with  . Abscess    The history is provided by the patient. No language interpreter was used.    HPI Comments: [name] is a 52 y.o. female who presents to the Emergency Department complaining of a progressively-worsening painful large abscess to her left breast that she first noticed one week ago.  Pt states she initially developed a sharp pain in that area and 5 days ago noticed a small white spot.  Since then it has developed into a large abscess with associated severe pain extending from the breast into the left arm.  Pain is greatly exacerbated by palpation and arm movement.  She denies fever or any other associated symptoms.   Past Medical History  Diagnosis Date  . Guillain-Barre   . Blind   . Neuropathy   . Thyroid disease   . Back pain   . Arthritis   . Seizures   . UTI (lower urinary tract infection)   . Anxiety   . Depression   . DDD (degenerative disc disease), lumbar     Past Surgical History  Procedure Laterality Date  . Cholecystectomy    . Tubal ligation    . Femur fracture surgery    . Ankle surgery    . Back surgery      No family history on file.   History  Substance Use Topics  . Smoking status: Current Every Day Smoker -- 0.50 packs/day    Types: Cigarettes  . Smokeless tobacco: Not on file  . Alcohol Use: No    OB History   Grav Para Term Preterm Abortions TAB SAB Ect Mult Living                  Review of Systems  Constitutional: Negative for fever.  Skin:       Abscess  All other systems reviewed and are negative.     Allergies  Neurontin; Acetaminophen; Eggs or egg-derived products; and Codeine  Home Medications   Current Outpatient Rx  Name   Route  Sig  Dispense  Refill  . cyanocobalamin (,VITAMIN B-12,) 1000 MCG/ML injection   Intramuscular   Inject 1,000 mcg into the muscle once a week. Sunday         . diclofenac sodium (VOLTAREN) 1 % GEL   Topical   Apply 1 application topically 4 (four) times daily as needed. For neuropathy pains         . levothyroxine (SYNTHROID, LEVOTHROID) 75 MCG tablet   Oral   Take 1 tablet (75 mcg total) by mouth every morning.   30 tablet   0   . potassium chloride SA (K-DUR,KLOR-CON) 20 MEQ tablet   Oral   Take 1 tablet (20 mEq total) by mouth 2 (two) times daily.   60 tablet   0   . pregabalin (LYRICA) 100 MG capsule   Oral   Take 100 mg by mouth 3 (three) times daily.          . promethazine (PHENERGAN) 12.5 MG tablet   Oral   Take 1 tablet (12.5 mg total) by mouth every 6 (six) hours as needed for nausea.   30 tablet   0   .  thiamine 100 MG tablet   Oral   Take 1 tablet (100 mg total) by mouth daily.   30 tablet   0   . zolpidem (AMBIEN CR) 6.25 MG CR tablet   Oral   Take 1 tablet (6.25 mg total) by mouth at bedtime as needed for sleep.   12 tablet   0    BP 104/76  Pulse 95  Temp(Src) 98.5 F (36.9 C) (Oral)  Resp 18  Ht 5' 4.5" (1.638 m)  Wt 180 lb (81.647 kg)  BMI 30.43 kg/m2  SpO2 94%  Physical Exam  Nursing note and vitals reviewed. Constitutional: She is oriented to person, place, and time. She appears well-developed and well-nourished. No distress.  HENT:  Head: Normocephalic and atraumatic.  Right Ear: Hearing normal.  Left Ear: Hearing normal.  Nose: Nose normal.  Mouth/Throat: Oropharynx is clear and moist and mucous membranes are normal.  Eyes: Conjunctivae and EOM are normal. Pupils are equal, round, and reactive to light.  Neck: Normal range of motion. Neck supple.  Cardiovascular: Regular rhythm, S1 normal and S2 normal.  Exam reveals no gallop and no friction rub.   No murmur heard. Pulmonary/Chest: Effort normal and breath sounds  normal. No respiratory distress. She exhibits no tenderness.  Abdominal: Soft. Normal appearance and bowel sounds are normal. There is no hepatosplenomegaly. There is no tenderness. There is no rebound, no guarding, no tenderness at McBurney's point and negative Murphy's sign. No hernia.  Musculoskeletal: Normal range of motion.  Neurological: She is alert and oriented to person, place, and time. She has normal strength. No cranial nerve deficit or sensory deficit. Coordination normal. GCS eye subscore is 4. GCS verbal subscore is 5. GCS motor subscore is 6.  Skin: Skin is warm, dry and intact. No rash noted. No cyanosis.     Psychiatric: She has a normal mood and affect. Her speech is normal and behavior is normal. Thought content normal.    ED Course  Procedures (including critical care time)  INCISION AND DRAINAGE Performed by: Gilda Crease. Consent: Verbal consent obtained. Risks and benefits: risks, benefits and alternatives were discussed Type: abscess  Body area: left breast  Anesthesia: local infiltration  Incision was made with a scalpel.  Local anesthetic: lidocaine 2% without epinephrine  Anesthetic total: 8 ml  Complexity: complex Blunt dissection to break up loculations  Drainage: purulent  Drainage amount: copious  Packing material: 1/4 in iodoform gauze  Patient tolerance: Patient tolerated the procedure well with no immediate complications.     DIAGNOSTIC STUDIES: Oxygen Saturation is 94% on room air, adequate by my interpretation.    COORDINATION OF CARE: 1:46 PM-Discussed treatment plan which includes I&D with pt at bedside and pt agreed to plan.    Labs Review Labs Reviewed  CBC WITH DIFFERENTIAL - Abnormal; Notable for the following:    RBC 2.93 (*)    HCT 34.8 (*)    MCV 118.8 (*)    MCH 41.0 (*)    All other components within normal limits  BASIC METABOLIC PANEL - Abnormal; Notable for the following:    Potassium 2.9 (*)     BUN 5 (*)    Creatinine, Ser 0.41 (*)    All other components within normal limits  CULTURE, ROUTINE-ABSCESS   Imaging Review  No results found.  MDM  Diagnosis: 1. Left Breast Abscess 2. Intertrigo, bilateral Breast  Presents with a large left breast abscess. Recommend incision and drainage. The procedure was described, she  did give verbal consent after risks and benefits were discussed. Surgery was performed, a large amount of pus was drained from the area the area was packed. Patient will be followed up in 2 days for packing removal and likely repacking. Patient is to call general surgery on-call, see if they can see her in 2 days, otherwise return to the ER. Ultram for pain, Bactrim DS 2 tabs PO BID for 10 days.  Patient also has a rash under both breasts it is consistent with intertrigo. Will be treated with clotrimazole cream   I personally performed the services described in this documentation, which was scribed in my presence. The recorded information has been reviewed and is accurate.     Gilda Crease, MD 01/17/13 [phone]

## 2013-01-17 NOTE — Progress Notes (Signed)
ED/CM noted patient did not have health insurance and/or PCP listed in the computer.  Patient was given the [place] resource handout with information on the clinics, food pantries, and the handout for new health insurance sign-up.  Patient expressed appreciation for this. 

## 2013-01-17 NOTE — ED Notes (Signed)
Pt alert & oriented x4, stable gait. Patient given discharge instructions, paperwork & prescription(s). Patient  instructed to stop at the registration desk to finish any additional paperwork. Patient verbalized understanding. Pt left department w/ no further questions. 

## 2013-01-24 LAB — CULTURE, ROUTINE-ABSCESS

## 2013-01-25 NOTE — ED Notes (Signed)
Post ED Visit - Positive Culture Follow-up: Successful Patient Follow-Up  Culture assessed and recommendations reviewed by: [x]  Wes Dulaney, Pharm.D., BCPS []  Celedonio Miyamoto, Pharm.D., BCPS []  Georgina Pillion, [address].D., BCPS []  [place], [address].D., BCPS, AAHIVP []  Estella Husk, Pharm.D., BCPS, AAHIVP  Positive Routine  culture  []  Patient discharged without antimicrobial prescription and treatment is now indicated [x]  Organism is resistant to prescribed ED discharge antimicrobial []  Patient with positive blood cultures  Changes discussed with ED provider: Ebbie Ridge New antibiotic prescription Start Keflex  500 mg po QID x 7 days Called to [place] [phone]  Contacted patient-Patient notified  date 01/25/2013  time 1837  Larena Sox 01/25/2013, 6:35 PM

## 2013-01-25 NOTE — Progress Notes (Signed)
ED Antimicrobial Stewardship Positive Culture Follow Up   [name] is an 52 y.o. female who presented to [facility] on 01/17/2013 with a chief complaint of  Chief Complaint  Patient presents with  . Abscess    Recent Results (from the past 720 hour(s))  CULTURE, ROUTINE-ABSCESS     Status: None   Collection Time    01/17/13  3:58 PM      Result Value Range Status   Specimen Description ABSCESS LEFT BREAST   Final   Special Requests NONE   Final   Gram Stain     Final   Value: MODERATE WBC PRESENT, PREDOMINANTLY PMN     RARE SQUAMOUS EPITHELIAL CELLS PRESENT     RARE GRAM POSITIVE COCCI     IN CLUSTERS     Performed at [facility]   Culture     Final   Value: MODERATE STAPHYLOCOCCUS SPECIES (COAGULASE NEGATIVE)     Performed at [facility]   Report Status 01/24/2013 FINAL   Final   Organism ID, Bacteria STAPHYLOCOCCUS SPECIES (COAGULASE NEGATIVE)   Final    [x]  Treated with Bactrim, organism may be resistant to prescribed antimicrobial []  Patient discharged originally without antimicrobial agent and treatment is now indicated  Recommendation: Perform symptom check. If symptoms (pain, fever, abscess formation) improving, complete Bactrim therapy and follow-up as advised. If symptoms persist/worsening, start Keflex 500mg  PO QID x 7 days and follow-up as advised.   ED Provider: Ebbie Ridge, PA-C   [name] 01/25/2013, 4:13 PM Infectious Diseases Pharmacist Phone# [phone]

## 2013-03-19 ENCOUNTER — Inpatient Hospital Stay (HOSPITAL_COMMUNITY)
Admission: EM | Admit: 2013-03-19 | Discharge: 2013-03-26 | DRG: 384 | Disposition: A | Discharge status: Home Health | Payer: Medicare Other | Attending: Family Medicine | Admitting: Family Medicine

## 2013-03-19 ENCOUNTER — Encounter (HOSPITAL_COMMUNITY): Payer: Self-pay | Admitting: Emergency Medicine

## 2013-03-19 ENCOUNTER — Emergency Department (HOSPITAL_COMMUNITY): Payer: Medicare Other

## 2013-03-19 DIAGNOSIS — D649 Anemia, unspecified: Secondary | ICD-10-CM

## 2013-03-19 DIAGNOSIS — H543 Unqualified visual loss, both eyes: Secondary | ICD-10-CM | POA: Diagnosis present

## 2013-03-19 DIAGNOSIS — G589 Mononeuropathy, unspecified: Secondary | ICD-10-CM | POA: Diagnosis present

## 2013-03-19 DIAGNOSIS — F1010 Alcohol abuse, uncomplicated: Secondary | ICD-10-CM

## 2013-03-19 DIAGNOSIS — R0902 Hypoxemia: Secondary | ICD-10-CM | POA: Diagnosis present

## 2013-03-19 DIAGNOSIS — F329 Major depressive disorder, single episode, unspecified: Secondary | ICD-10-CM | POA: Diagnosis present

## 2013-03-19 DIAGNOSIS — E669 Obesity, unspecified: Secondary | ICD-10-CM | POA: Diagnosis present

## 2013-03-19 DIAGNOSIS — Z87440 Personal history of urinary (tract) infections: Secondary | ICD-10-CM

## 2013-03-19 DIAGNOSIS — Z833 Family history of diabetes mellitus: Secondary | ICD-10-CM

## 2013-03-19 DIAGNOSIS — K279 Peptic ulcer, site unspecified, unspecified as acute or chronic, without hemorrhage or perforation: Principal | ICD-10-CM | POA: Diagnosis present

## 2013-03-19 DIAGNOSIS — N390 Urinary tract infection, site not specified: Secondary | ICD-10-CM

## 2013-03-19 DIAGNOSIS — G610 Guillain-Barre syndrome: Secondary | ICD-10-CM | POA: Diagnosis present

## 2013-03-19 DIAGNOSIS — H548 Legal blindness, as defined in USA: Secondary | ICD-10-CM | POA: Diagnosis present

## 2013-03-19 DIAGNOSIS — D62 Acute posthemorrhagic anemia: Secondary | ICD-10-CM | POA: Diagnosis not present

## 2013-03-19 DIAGNOSIS — K5900 Constipation, unspecified: Secondary | ICD-10-CM | POA: Diagnosis present

## 2013-03-19 DIAGNOSIS — M129 Arthropathy, unspecified: Secondary | ICD-10-CM | POA: Diagnosis present

## 2013-03-19 DIAGNOSIS — D72829 Elevated white blood cell count, unspecified: Secondary | ICD-10-CM

## 2013-03-19 DIAGNOSIS — R112 Nausea with vomiting, unspecified: Secondary | ICD-10-CM

## 2013-03-19 DIAGNOSIS — M5137 Other intervertebral disc degeneration, lumbosacral region: Secondary | ICD-10-CM | POA: Diagnosis present

## 2013-03-19 DIAGNOSIS — J45909 Unspecified asthma, uncomplicated: Secondary | ICD-10-CM | POA: Diagnosis present

## 2013-03-19 DIAGNOSIS — T4275XA Adverse effect of unspecified antiepileptic and sedative-hypnotic drugs, initial encounter: Secondary | ICD-10-CM | POA: Diagnosis present

## 2013-03-19 DIAGNOSIS — E860 Dehydration: Secondary | ICD-10-CM | POA: Diagnosis present

## 2013-03-19 DIAGNOSIS — T3995XA Adverse effect of unspecified nonopioid analgesic, antipyretic and antirheumatic, initial encounter: Secondary | ICD-10-CM | POA: Diagnosis present

## 2013-03-19 DIAGNOSIS — F1020 Alcohol dependence, uncomplicated: Secondary | ICD-10-CM | POA: Diagnosis present

## 2013-03-19 DIAGNOSIS — R7989 Other specified abnormal findings of blood chemistry: Secondary | ICD-10-CM

## 2013-03-19 DIAGNOSIS — K7030 Alcoholic cirrhosis of liver without ascites: Secondary | ICD-10-CM | POA: Diagnosis present

## 2013-03-19 DIAGNOSIS — K2100 Gastro-esophageal reflux disease with esophagitis, without bleeding: Secondary | ICD-10-CM | POA: Diagnosis present

## 2013-03-19 DIAGNOSIS — D539 Nutritional anemia, unspecified: Secondary | ICD-10-CM | POA: Diagnosis present

## 2013-03-19 DIAGNOSIS — E876 Hypokalemia: Secondary | ICD-10-CM | POA: Diagnosis present

## 2013-03-19 DIAGNOSIS — F17200 Nicotine dependence, unspecified, uncomplicated: Secondary | ICD-10-CM | POA: Diagnosis present

## 2013-03-19 DIAGNOSIS — R5381 Other malaise: Secondary | ICD-10-CM | POA: Diagnosis present

## 2013-03-19 DIAGNOSIS — R319 Hematuria, unspecified: Secondary | ICD-10-CM | POA: Diagnosis present

## 2013-03-19 DIAGNOSIS — M51379 Other intervertebral disc degeneration, lumbosacral region without mention of lumbar back pain or lower extremity pain: Secondary | ICD-10-CM | POA: Diagnosis present

## 2013-03-19 DIAGNOSIS — B1920 Unspecified viral hepatitis C without hepatic coma: Secondary | ICD-10-CM | POA: Diagnosis present

## 2013-03-19 DIAGNOSIS — F3289 Other specified depressive episodes: Secondary | ICD-10-CM | POA: Diagnosis present

## 2013-03-19 DIAGNOSIS — D696 Thrombocytopenia, unspecified: Secondary | ICD-10-CM | POA: Diagnosis present

## 2013-03-19 DIAGNOSIS — F411 Generalized anxiety disorder: Secondary | ICD-10-CM | POA: Diagnosis present

## 2013-03-19 DIAGNOSIS — R945 Abnormal results of liver function studies: Secondary | ICD-10-CM | POA: Diagnosis present

## 2013-03-19 DIAGNOSIS — K449 Diaphragmatic hernia without obstruction or gangrene: Secondary | ICD-10-CM | POA: Diagnosis present

## 2013-03-19 DIAGNOSIS — K7010 Alcoholic hepatitis without ascites: Secondary | ICD-10-CM | POA: Diagnosis present

## 2013-03-19 DIAGNOSIS — Z9181 History of falling: Secondary | ICD-10-CM

## 2013-03-19 DIAGNOSIS — K209 Esophagitis, unspecified without bleeding: Secondary | ICD-10-CM | POA: Diagnosis present

## 2013-03-19 DIAGNOSIS — K311 Adult hypertrophic pyloric stenosis: Secondary | ICD-10-CM | POA: Diagnosis present

## 2013-03-19 DIAGNOSIS — E039 Hypothyroidism, unspecified: Secondary | ICD-10-CM | POA: Diagnosis present

## 2013-03-19 DIAGNOSIS — R109 Unspecified abdominal pain: Secondary | ICD-10-CM

## 2013-03-19 HISTORY — DX: Other specified abnormal immunological findings in serum: R76.8

## 2013-03-19 LAB — CBC WITH DIFFERENTIAL/PLATELET
Basophils Relative: 0 % (ref 0–1)
Eosinophils Relative: 1 % (ref 0–5)
HCT: 25.1 % — ABNORMAL LOW (ref 36.0–46.0)
Hemoglobin: 8.7 g/dL — ABNORMAL LOW (ref 12.0–15.0)
Lymphs Abs: 1.7 10*3/uL (ref 0.7–4.0)
MCH: 39.9 pg — ABNORMAL HIGH (ref 26.0–34.0)
MCV: 115.1 fL — ABNORMAL HIGH (ref 78.0–100.0)
Monocytes Relative: 5 % (ref 3–12)
Neutro Abs: 9.8 10*3/uL — ABNORMAL HIGH (ref 1.7–7.7)
RBC: 2.18 MIL/uL — ABNORMAL LOW (ref 3.87–5.11)
WBC: 12.2 10*3/uL — ABNORMAL HIGH (ref 4.0–10.5)

## 2013-03-19 LAB — COMPREHENSIVE METABOLIC PANEL
AST: 112 U/L — ABNORMAL HIGH (ref 0–37)
BUN: 11 mg/dL (ref 6–23)
CO2: 31 mEq/L (ref 19–32)
Chloride: 88 mEq/L — ABNORMAL LOW (ref 96–112)
Creatinine, Ser: 0.56 mg/dL (ref 0.50–1.10)
GFR calc Af Amer: >90 mL/min (ref >90)
GFR calc non Af Amer: >90 mL/min (ref >90)
Glucose, Bld: 101 mg/dL — ABNORMAL HIGH (ref 70–99)
Total Bilirubin: 5.9 mg/dL — ABNORMAL HIGH (ref 0.3–1.2)

## 2013-03-19 LAB — BILIRUBIN, FRACTIONATED(TOT/DIR/INDIR)
Bilirubin, Direct: 3.8 mg/dL — ABNORMAL HIGH (ref 0.0–0.3)
Indirect Bilirubin: 1.5 mg/dL — ABNORMAL HIGH (ref 0.3–0.9)
Total Bilirubin: 5.3 mg/dL — ABNORMAL HIGH (ref 0.3–1.2)

## 2013-03-19 LAB — URINE MICROSCOPIC-ADD ON

## 2013-03-19 LAB — URINALYSIS, ROUTINE W REFLEX MICROSCOPIC
Glucose, UA: 100 mg/dL — AB
Ketones, ur: 15 mg/dL — AB
Protein, ur: 100 mg/dL — AB

## 2013-03-19 LAB — AMMONIA: Ammonia: 46 umol/L (ref 11–60)

## 2013-03-19 LAB — ETHANOL: Alcohol, Ethyl (B): <11 mg/dL (ref 0–11)

## 2013-03-19 MED ORDER — ONDANSETRON HCL 4 MG/2ML IJ SOLN
4.0000 mg | Freq: Once | INTRAMUSCULAR | Status: AC
  Administered 2013-03-19: 4 mg via INTRAVENOUS
  Filled 2013-03-19: qty 2

## 2013-03-19 MED ORDER — ONDANSETRON HCL 4 MG/2ML IJ SOLN: 4.0000 mg | Freq: Three times a day (TID) | INTRAMUSCULAR | Status: DC | PRN

## 2013-03-19 MED ORDER — IOHEXOL 300 MG/ML  SOLN
100.0000 mL | Freq: Once | INTRAMUSCULAR | Status: AC | PRN
  Administered 2013-03-19: 100 mL via INTRAVENOUS

## 2013-03-19 MED ORDER — ONDANSETRON HCL 4 MG PO TABS: 4.0000 mg | ORAL_TABLET | Freq: Four times a day (QID) | ORAL | Status: DC | PRN

## 2013-03-19 MED ORDER — POTASSIUM CHLORIDE CRYS ER 20 MEQ PO TBCR
20.0000 meq | EXTENDED_RELEASE_TABLET | Freq: Two times a day (BID) | ORAL | Status: DC
  Administered 2013-03-20 – 2013-03-26 (×10): 20 meq via ORAL
  Filled 2013-03-19 (×15): qty 1

## 2013-03-19 MED ORDER — SODIUM CHLORIDE 0.9 % IV SOLN: INTRAVENOUS | Status: DC

## 2013-03-19 MED ORDER — HYDROMORPHONE HCL PF 1 MG/ML IJ SOLN
1.0000 mg | Freq: Once | INTRAMUSCULAR | Status: AC
  Administered 2013-03-19: 1 mg via INTRAVENOUS
  Filled 2013-03-19: qty 1

## 2013-03-19 MED ORDER — DOCUSATE SODIUM 100 MG PO CAPS
100.0000 mg | ORAL_CAPSULE | Freq: Two times a day (BID) | ORAL | Status: DC
  Administered 2013-03-20 – 2013-03-24 (×8): 100 mg via ORAL
  Filled 2013-03-19 (×12): qty 1

## 2013-03-19 MED ORDER — LEVOTHYROXINE SODIUM 75 MCG PO TABS
75.0000 ug | ORAL_TABLET | Freq: Every morning | ORAL | Status: DC
Start: 2013-03-20 — End: 2013-03-26
  Administered 2013-03-20 – 2013-03-26 (×6): 75 ug via ORAL
  Filled 2013-03-19 (×7): qty 1

## 2013-03-19 MED ORDER — POTASSIUM CHLORIDE 10 MEQ/100ML IV SOLN
10.0000 meq | INTRAVENOUS | Status: AC
  Administered 2013-03-19 – 2013-03-20 (×4): 10 meq via INTRAVENOUS
  Filled 2013-03-19 (×4): qty 100

## 2013-03-19 MED ORDER — SODIUM CHLORIDE 0.9 % IV SOLN: INTRAVENOUS | Status: AC

## 2013-03-19 MED ORDER — HYDROMORPHONE HCL PF 1 MG/ML IJ SOLN
0.5000 mg | INTRAMUSCULAR | Status: DC | PRN
  Administered 2013-03-19 – 2013-03-23 (×23): 0.5 mg via INTRAVENOUS
  Filled 2013-03-19 (×23): qty 1

## 2013-03-19 MED ORDER — DEXTROSE 5 % IV SOLN
1.0000 g | INTRAVENOUS | Status: DC
  Administered 2013-03-20 – 2013-03-21 (×2): 1 g via INTRAVENOUS
  Filled 2013-03-19 (×3): qty 10

## 2013-03-19 MED ORDER — DEXTROSE 5 % IV SOLN
1.0000 g | Freq: Once | INTRAVENOUS | Status: AC
  Administered 2013-03-19: 1 g via INTRAVENOUS
  Filled 2013-03-19: qty 10

## 2013-03-19 MED ORDER — ONDANSETRON HCL 4 MG/2ML IJ SOLN
4.0000 mg | Freq: Four times a day (QID) | INTRAMUSCULAR | Status: DC | PRN
  Administered 2013-03-20 – 2013-03-22 (×4): 4 mg via INTRAVENOUS
  Filled 2013-03-19 (×4): qty 2

## 2013-03-19 MED ORDER — PREGABALIN 50 MG PO CAPS
100.0000 mg | ORAL_CAPSULE | Freq: Three times a day (TID) | ORAL | Status: DC
  Administered 2013-03-20 – 2013-03-26 (×17): 100 mg via ORAL
  Filled 2013-03-19 (×21): qty 2

## 2013-03-19 MED ORDER — FOLIC ACID 1 MG PO TABS
1.0000 mg | ORAL_TABLET | Freq: Every day | ORAL | Status: DC
  Administered 2013-03-22 – 2013-03-26 (×5): 1 mg via ORAL
  Filled 2013-03-19 (×8): qty 1

## 2013-03-19 MED ORDER — POLYETHYLENE GLYCOL 3350 17 G PO PACK
17.0000 g | PACK | Freq: Every day | ORAL | Status: DC
  Administered 2013-03-22 – 2013-03-24 (×3): 17 g via ORAL
  Filled 2013-03-19 (×3): qty 1

## 2013-03-19 MED ORDER — VITAMIN B-1 100 MG PO TABS
100.0000 mg | ORAL_TABLET | Freq: Every day | ORAL | Status: DC
  Administered 2013-03-20 – 2013-03-26 (×6): 100 mg via ORAL
  Filled 2013-03-19 (×8): qty 1

## 2013-03-19 NOTE — H&P (Signed)
[facility] History and Physical  [name] ZOX:[id number] DOB: 10/20/1960 DOA: 03/19/2013   PCP: Ernestine Conrad, MD  Specialists: Followed by a neurologist in [facility] for her history of GBS  Chief Complaint: Fall on Sunday followed by nausea, vomiting since Monday  HPI: [name] is a 52 y.o. female with a past medical history of hepatitis C, GBS, legal blindness, arthritis, obesity, who was in her usual state of health till this past Sunday, when she was walking with her walker and she tripped on something and fell on her walker. Denies any syncopal episode. She fell on the right side and as a result of it the right side of her body hurts including her back. Since, then she's had generalized weakness. And, then on Monday she started having nausea, vomiting. Has been innumerable amount of times. No blood in the emesis. The abdominal pain is located in the lower abdomen history 10/10 in intensity, described as a sharp pain without any radiation. No precipitating, aggravating or relieving factors. She's had some painful urination, with blood in the urine, but denies any fever or chills. She is very dry in her mouth. She's had very poor oral intake in the last many days due to her symptoms. Denies any diarrhea, and actually has been constipated and her last bowel movement was last Saturday. She's had a cough with clear expectoration. Denies any chest pain or shortness of breath. Denies being placed on any new medications recently.  Home Medications: Prior to Admission medications   Medication Sig Start Date End Date Taking? Authorizing Provider  cyanocobalamin (,VITAMIN B-12,) 1000 MCG/ML injection Inject 1,000 mcg into the muscle once a week. Sunday   Yes Historical Provider, MD  diclofenac sodium (VOLTAREN) 1 % GEL Apply 1 application topically 4 (four) times daily as needed. For neuropathy pains   Yes Historical Provider, MD  levothyroxine (SYNTHROID, LEVOTHROID) 75 MCG tablet Take 1  tablet (75 mcg total) by mouth every morning. 05/01/12  Yes Nimish Normajean Glasgow, MD  potassium chloride SA (K-DUR,KLOR-CON) 20 MEQ tablet Take 1 tablet (20 mEq total) by mouth 2 (two) times daily. 05/01/12  Yes Nimish Normajean Glasgow, MD  pregabalin (LYRICA) 100 MG capsule Take 100 mg by mouth 3 (three) times daily.    Yes Historical Provider, MD  thiamine 100 MG tablet Take 1 tablet (100 mg total) by mouth daily. 05/01/12  Yes Nimish Normajean Glasgow, MD    Allergies:  Allergies  Allergen Reactions  . Neurontin [Gabapentin] Anaphylaxis  . Acetaminophen Itching and Nausea And Vomiting  . Eggs Or Egg-Derived Products Nausea And Vomiting  . Codeine Itching and Nausea And Vomiting    Patient is able to take morphine, demerol and fentanyl with out any type of side effects    Past Medical History: Past Medical History  Diagnosis Date  . Guillain-Barre   . Blind   . Neuropathy   . Thyroid disease   . Back pain   . Arthritis   . Seizures   . UTI (lower urinary tract infection)   . Anxiety   . Depression   . DDD (degenerative disc disease), lumbar   . Hepatitis C     Past Surgical History  Procedure Laterality Date  . Cholecystectomy    . Tubal ligation    . Femur fracture surgery    . Ankle surgery    . Back surgery      Social History: Patient lives with her husband in [place]. Smokes half a pack  cigarettes on a daily basis. Very occasional alcohol use. No illicit drug use. Uses a walker to ambulate  Family History: She states a history of diabetes, and arthritis in the family  Review of Systems - History obtained from the patient General ROS: positive for  - fatigue Psychological ROS: positive for - anxiety Ophthalmic ROS: positive for - legal blindness ENT ROS: negative Allergy and Immunology ROS: negative Hematological and Lymphatic ROS: negative Endocrine ROS: negative Respiratory ROS: as in hpi Cardiovascular ROS: no chest pain or dyspnea on exertion Gastrointestinal ROS: as in  hpi Genito-Urinary ROS: as in hpi Musculoskeletal ROS: as in hpi Neurological ROS: as in hpi Dermatological ROS: negative  Physical Examination  Filed Vitals:   03/19/13 1614 03/19/13 1812 03/19/13 1829 03/19/13 1900  BP:  108/75  100/82  Pulse: 93 111 102 107  Temp:      TempSrc:      Resp:      Height:      Weight:      SpO2: 93% 96% 97% 95%    General appearance: alert, cooperative, appears stated age, no distress and moderately obese Head: Normocephalic, without obvious abnormality, atraumatic Eyes: conjunctivae/corneas clear. PERRL, EOM's intact.  Throat: dry mm Neck: no adenopathy, no carotid bruit, no JVD, supple, symmetrical, trachea midline and thyroid not enlarged, symmetric, no tenderness/mass/nodules Back: symmetric, no curvature. ROM normal. No CVA tenderness. Resp: clear to auscultation bilaterally Cardio: regular rate and rhythm, S1, S2 normal, no murmur, click, rub or gallop GI: Abdomen is soft, obese, tenderness in the lower part, as well as over the epigastric area. Hepatomegaly is appreciated. Bowel sounds are present. No other masses appreciated. No rebound, rigidity, or guarding. Extremities: extremities normal, atraumatic, no cyanosis or edema Pulses: 2+ and symmetric Skin: Skin color, texture, turgor normal. No rashes or lesions Lymph nodes: Cervical, supraclavicular, and axillary nodes normal. Neurologic: She is alert and oriented x3. No focal neurological deficits are present. She did have some difficulty raising her legs off the bed, but this is chronic as per the patient due to her GBS  Laboratory Data: Results for orders placed during the hospital encounter of 03/19/13 (from the past 48 hour(s))  URINALYSIS, ROUTINE W REFLEX MICROSCOPIC     Status: Abnormal   Collection Time    03/19/13  3:56 PM      Result Value Range   Color, Urine BROWN (*) YELLOW   Comment: BIOCHEMICALS MAY BE AFFECTED BY COLOR   APPearance CLOUDY (*) CLEAR   Specific  Gravity, Urine 1.025  1.005 - 1.030   pH 6.5  5.0 - 8.0   Glucose, UA 100 (*) NEGATIVE mg/dL   Hgb urine dipstick NEGATIVE  NEGATIVE   Bilirubin Urine LARGE (*) NEGATIVE   Ketones, ur 15 (*) NEGATIVE mg/dL   Protein, ur 161 (*) NEGATIVE mg/dL   Urobilinogen, UA >0.9 (*) 0.0 - 1.0 mg/dL   Nitrite POSITIVE (*) NEGATIVE   Leukocytes, UA TRACE (*) NEGATIVE  URINE MICROSCOPIC-ADD ON     Status: Abnormal   Collection Time    03/19/13  3:56 PM      Result Value Range   WBC, UA 7-10  <3 WBC/hpf   RBC / HPF 3-6  <3 RBC/hpf   Bacteria, UA MANY (*) RARE  SEDIMENTATION RATE     Status: Abnormal   Collection Time    03/19/13  3:58 PM      Result Value Range   Sed Rate 73 (*) 0 - 22 mm/hr  CBC WITH DIFFERENTIAL     Status: Abnormal   Collection Time    03/19/13  4:01 PM      Result Value Range   WBC 12.2 (*) 4.0 - 10.5 K/uL   RBC 2.18 (*) 3.87 - 5.11 MIL/uL   Hemoglobin 8.7 (*) 12.0 - 15.0 g/dL   HCT 16.1 (*) 09.6 - 04.5 %   MCV 115.1 (*) 78.0 - 100.0 fL   MCH 39.9 (*) 26.0 - 34.0 pg   MCHC 34.7  30.0 - 36.0 g/dL   RDW 40.9  81.1 - 91.4 %   Platelets 112 (*) 150 - 400 K/uL   Comment: SPECIMEN CHECKED FOR CLOTS     LARGE PLATELETS PRESENT     PLATELET COUNT CONFIRMED BY SMEAR   Neutrophils Relative % 80 (*) 43 - 77 %   Lymphocytes Relative 14  12 - 46 %   Monocytes Relative 5  3 - 12 %   Eosinophils Relative 1  0 - 5 %   Basophils Relative 0  0 - 1 %   Neutro Abs 9.8 (*) 1.7 - 7.7 K/uL   Lymphs Abs 1.7  0.7 - 4.0 K/uL   Monocytes Absolute 0.6  0.1 - 1.0 K/uL   Eosinophils Absolute 0.1  0.0 - 0.7 K/uL   Basophils Absolute 0.0  0.0 - 0.1 K/uL   RBC Morphology OVAL MACROCYTES    COMPREHENSIVE METABOLIC PANEL     Status: Abnormal   Collection Time    03/19/13  4:01 PM      Result Value Range   Sodium 134 (*) 135 - 145 mEq/L   Potassium 3.3 (*) 3.5 - 5.1 mEq/L   Chloride 88 (*) 96 - 112 mEq/L   CO2 31  19 - 32 mEq/L   Glucose, Bld 101 (*) 70 - 99 mg/dL   BUN 11  6 - 23 mg/dL    Creatinine, Ser 7.82  0.50 - 1.10 mg/dL   Calcium 8.5  8.4 - 95.6 mg/dL   Total Protein 7.3  6.0 - 8.3 g/dL   Albumin 2.4 (*) 3.5 - 5.2 g/dL   AST 213 (*) 0 - 37 U/L   ALT 31  0 - 35 U/L   Alkaline Phosphatase 242 (*) 39 - 117 U/L   Total Bilirubin 5.9 (*) 0.3 - 1.2 mg/dL   GFR calc non Af Amer >90  >90 mL/min   GFR calc Af Amer >90  >90 mL/min   Comment: (NOTE)     The eGFR has been calculated using the CKD EPI equation.     This calculation has not been validated in all clinical situations.     eGFR's persistently <90 mL/min signify possible Chronic Kidney     Disease.  LIPASE, BLOOD     Status: Abnormal   Collection Time    03/19/13  4:01 PM      Result Value Range   Lipase 8 (*) 11 - 59 U/L  ETHANOL     Status: None   Collection Time    03/19/13  4:01 PM      Result Value Range   Alcohol, Ethyl (B) <11  0 - 11 mg/dL   Comment:            LOWEST DETECTABLE LIMIT FOR     SERUM ALCOHOL IS 11 mg/dL     FOR MEDICAL PURPOSES ONLY  ACETAMINOPHEN LEVEL     Status: None   Collection Time    03/19/13  4:01 PM      Result Value Range   Acetaminophen (Tylenol), Serum <15.0  10 - 30 ug/mL   Comment:            THERAPEUTIC CONCENTRATIONS VARY     SIGNIFICANTLY. A RANGE OF 10-30     ug/mL MAY BE AN EFFECTIVE     CONCENTRATION FOR MANY PATIENTS.     HOWEVER, SOME ARE BEST TREATED     AT CONCENTRATIONS OUTSIDE THIS     RANGE.     ACETAMINOPHEN CONCENTRATIONS     >150 ug/mL AT 4 HOURS AFTER     INGESTION AND >50 ug/mL AT 12     HOURS AFTER INGESTION ARE     OFTEN ASSOCIATED WITH TOXIC     REACTIONS.    Radiology Reports: Dg Chest 1 View  03/19/2013   CLINICAL DATA:  Fall 1 week ago  EXAM: CHEST - 1 VIEW  COMPARISON:  11/01/2012  FINDINGS: The heart size and mediastinal contours are within normal limits. Both lungs are clear. The visualized skeletal structures are unremarkable.  IMPRESSION: No active disease.   Electronically Signed   By: Signa Kell M.D.   On: 03/19/2013  17:17   Dg Lumbar Spine Complete  03/19/2013   CLINICAL DATA:  Fall.  EXAM: LUMBAR SPINE - COMPLETE 4+ VIEW  COMPARISON:  None.  FINDINGS: There is no evidence of lumbar spine fracture. Alignment is normal. Disc space narrowing and ventral spurring is noted at L5-S1.  IMPRESSION: 1. No acute findings. 2. L5-S1 degenerative disc disease   Electronically Signed   By: Signa Kell M.D.   On: 03/19/2013 17:16   Dg Hip Complete Right  03/19/2013   CLINICAL DATA:  Fall.  One week ago  EXAM: RIGHT HIP - COMPLETE 2+ VIEW  COMPARISON:  None  FINDINGS: Healed fracture deformity involving the proximal right femur is identified. There is no acute fracture or subluxation identified.  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  IMPRESSION: 1. No acute findings. 2. Healed right proximal femur fracture.   Electronically Signed   By: Signa Kell M.D.   On: 03/19/2013 17:14   Ct Abdomen Pelvis W Contrast  03/19/2013   CLINICAL DATA:  Fall 1 week ago with right-sided pain.  EXAM: CT ABDOMEN AND PELVIS WITH CONTRAST  TECHNIQUE: Multidetector CT imaging of the abdomen and pelvis was performed using the standard protocol following bolus administration of intravenous contrast.  CONTRAST:  OMNIPAQUE IOHEXOL 300 MG/ML  SOLN  COMPARISON:  04/29/2012  FINDINGS: Lung bases are clear.  Negative for free air.  There is diffuse low-attenuation of the liver consistent with hepatic steatosis. Liver is large for size. Scattered areas of fatty sparing throughout the liver. The gallbladder has been removed. Normal appearance of the spleen, adrenal glands, pancreas and both kidneys. There is chronic stranding in the retroperitoneum and right paracolic gutter. Incidentally, the patient has a retroaortic left renal vein. Stable stranding around the root of the IMA. No significant free fluid or lymphadenopathy. Atherosclerotic calcifications in the aorta and visceral vessels without aneurysmal dilatation. No gross abnormality to  the uterus or adnexa tissue. Small amount of fluid in the urinary bladder. No gross abnormality to the appendix. No acute bone abnormality.  IMPRESSION: Hepatomegaly with diffuse low-attenuation of the liver. Findings are suggestive for hepatic steatosis. Cirrhosis cannot be excluded.  There is chronic stranding in the abdomen, particularly in the retroperitoneum. Findings could represent prior inflammation or fibrosis.   Electronically Signed  By: Richarda Overlie M.D.   On: 03/19/2013 17:34    Problem List  Principal Problem:   Nausea and vomiting in adult Active Problems:   Dehydration   Abnormal LFTs   Hypothyroid   UTI (urinary tract infection)   Hyperbilirubinemia   Macrocytic anemia   GBS (Guillain Barre syndrome)   Hepatitis C   Assessment: This is a 52 year old, Caucasian female, who presents with nausea, vomiting, and abdominal pain since Monday. She had a mechanical fall on Sunday. X-ray does not show any fractures. Reason for nausea, vomiting is not entirely clear. She does have a UTI, which could be causing her symptoms. She is also noted to have significant liver function abnormalities, along with hepatomegaly. She does have a history of hepatitis C.  Plan: #1 mechanical fall on Sunday: No obvious fracture is identified on imaging studies. Pain control will be provided. She'll be seen by physical and occupational therapy.  #2 nausea and vomiting: She'll be given symptomatic treatment. CT does not show any obstruction. Nausea, vomiting could be from her UTI.  #3 urinary tract infection: Continue with ceftriaxone. Follow up on Urine cultures . Previous cultures have grown Escherichia coli, sensitive to ceftriaxone. She appears to have a recurrent episodes of UTI. She may benefit from being referred to a urologist as an outpatient.  #4 abnormal LFTs with hepatomegaly: She does have significant hyperbilirubinemia as well as icterus. We will fractionate the bilirubin. She is known to  have hepatitis C. We will consult gastroenterology to assist with management. Home medication list was reviewed and none of them is a known inciting agent to cause liver abnormalities. Will check PT/INR. We'll check ammonia level. Ultrasound of the abdomen will be obtained to get a better look at the hepatobiliary system. She denies significant alcohol use.  #5 macrocytic anemia: She has known folate deficiency. She is on vitamin B12 injections. She may benefit from being on folic acid as well. Hemoglobin is lower than her baseline. No overt bleeding is identified. Continue to monitor counts closely. Anemia panel was checked recently in July.  #6 history of GBS: This was diagnosed 4 years ago. She has residual deficits even now. She is legally blind. She is followed by a neurologist in [place].  #7 history of hypothyroidism: Continue with her Synthroid. Due to her constipation will check a thyroid function test.   DVT Prophylaxis: SCDs Code Status: Full code Family Communication: Discussed with the patient and her husband  Disposition Plan: Admit to MedSurg   Further management decisions will depend on results of further testing and patient's response to treatment.  [facility]  [facility] Pager [phone]  If 7PM-7AM, please contact night-coverage [email or web address] Password [email or web address]  03/19/2013, 8:01 PM

## 2013-03-19 NOTE — ED Notes (Addendum)
Pt is here for multiple complaints-Pt with abd pain since Monday with N/V, fall since Sunday with right ankle/leg pain, states unable to walk, states uses walker, pt thinks she is dehydrated

## 2013-03-19 NOTE — ED Notes (Signed)
Attempted to call report, nurse in report and was told that nurse would call back for report on pt.

## 2013-03-19 NOTE — ED Provider Notes (Signed)
CSN: [id number]     Arrival date & time 03/19/13  1524 History  This chart was scribed for Vida Roller, MD by Quintella Reichert, ED scribe.  This patient was seen in room APA10/APA10 and the patient's care was started at 3:43 PM.   Chief Complaint  Patient presents with  . Abdominal Pain  . Fall    The history is provided by the patient. No language interpreter was used.    HPI Comments:s [name] is a 52 y.o. female who presents to the Emergency Department complaining of abdominal pain with associated nausea and vomiting that began 5 days ago after a fall the day before.  Pt had fall from standing when she lost her balance (legally blind - occurred while walking around her home). 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  Since then she has had severe pain in her back and her right hip.  Hip pain is greatly worsened by flexion and rotation.  Her pain has persisted since then and has not subsided at all.  5 days ago pt states she developed a "stomach virus" with symptoms including severe cramping abdominal pain worst on the right side.  She has also been vomiting "several times" every day.  She complains of associated decreased urination and states she thinks she is dehydrated and "losing potassium."  Pt also notes that she has not moved her bowels at all in a week.  She states she has asthma and wheezes frequently.  She denies h/o COPD.  She denies known h/o HTN, DM, heart problems, or kidney problems.  She admits to h/o cholecystectomy and tubal ligation.  She denies any other abdominal surgeries.  She does not drink.  She is a current smoker.  She lives at home with her husband.  The pain in her abdomin is constant, daily, gradually worsening.  PCP is Dr. Loney Hering in [place], [place]   Past Medical History  Diagnosis Date  . Guillain-Barre   . Blind   . Neuropathy   . Thyroid  disease   . Back pain   . Arthritis   . Seizures   . UTI (lower urinary tract infection)   . Anxiety   . Depression   . DDD (degenerative disc disease), lumbar   . Hepatitis C     Past Surgical History  Procedure Laterality Date  . Cholecystectomy    . Tubal ligation    . Femur fracture surgery    . Ankle surgery    . Back surgery      History reviewed. No pertinent family history.   History  Substance Use Topics  . Smoking status: Current Every Day Smoker -- 0.50 packs/day    Types: Cigarettes  . Smokeless tobacco: Not on file  . Alcohol Use: No    OB History   Grav Para Term Preterm Abortions TAB SAB Ect Mult Living                  Review of Systems  All other systems reviewed and are negative.     Allergies  Neurontin; Acetaminophen; Eggs or egg-derived products; and Codeine  Home Medications   Current Outpatient Rx  Name  Route  Sig  Dispense  Refill  . cyanocobalamin (,VITAMIN B-12,) 1000 MCG/ML injection   Intramuscular   Inject 1,000 mcg into the muscle once a  week. Sunday         . diclofenac sodium (VOLTAREN) 1 % GEL   Topical   Apply 1 application topically 4 (four) times daily as needed. For neuropathy pains         . levothyroxine (SYNTHROID, LEVOTHROID) 75 MCG tablet   Oral   Take 1 tablet (75 mcg total) by mouth every morning.   30 tablet   0   . potassium chloride SA (K-DUR,KLOR-CON) 20 MEQ tablet   Oral   Take 1 tablet (20 mEq total) by mouth 2 (two) times daily.   60 tablet   0   . pregabalin (LYRICA) 100 MG capsule   Oral   Take 100 mg by mouth 3 (three) times daily.          Marland Kitchen thiamine 100 MG tablet   Oral   Take 1 tablet (100 mg total) by mouth daily.   30 tablet   0    BP 111/82  Pulse 94  Temp(Src) 97.9 F (36.6 C) (Oral)  Resp 18  Ht 5\' 4"  (1.626 m)  Wt 170 lb (77.111 kg)  BMI 29.17 kg/m2  SpO2 100%  Physical Exam  Nursing note and vitals reviewed. Constitutional: She is oriented to person,  place, and time. She appears well-developed and well-nourished. No distress.  HENT:  Head: Normocephalic and atraumatic.  MM appear dehydrated, OP without exudate, erythema or asymetry  Eyes: Conjunctivae are normal. Pupils are equal, round, and reactive to light. No scleral icterus.  Slight perioribtal erythema, and scabbing / scaling  Neck: Neck supple.  Cardiovascular: Normal rate, regular rhythm, normal heart sounds and intact distal pulses.   No murmur heard. Pulmonary/Chest: Effort normal and breath sounds normal. No stridor. No respiratory distress. She has no rales.  Abdominal: Soft. She exhibits no distension and no mass. There is tenderness ( ttp in the RUQ and RLQ with mild guarding, mild ttp in the L side of the abd.  no peritoneal signs). There is no rebound.  Tympanitic in RLQ  Musculoskeletal: Normal range of motion. She exhibits tenderness ( ttp with ROM of the R hip with flexion past 90 degrees and at extremes of rotation.  no deformity of RLE, no leg length discrepancy). She exhibits no edema.  Neurological: She is alert and oriented to person, place, and time.  Skin: Skin is warm and dry. No rash noted.  Psychiatric: She has a normal mood and affect. Her behavior is normal.    ED Course  Procedures (including critical care time)  DIAGNOSTIC STUDIES: Oxygen Saturation is 100% on room air, normal by my interpretation.    COORDINATION OF CARE: 3:49 PM-Discussed treatment plan which includes IV fluids, pain medication, anti-emetics, imaging and labs with pt at bedside and pt agreed to plan.    Labs Review Labs Reviewed  CBC WITH DIFFERENTIAL - Abnormal; Notable for the following:    WBC 12.2 (*)    RBC 2.18 (*)    Hemoglobin 8.7 (*)    HCT 25.1 (*)    MCV 115.1 (*)    MCH 39.9 (*)    Platelets 112 (*)    Neutrophils Relative % 80 (*)    Neutro Abs 9.8 (*)    All other components within normal limits  COMPREHENSIVE METABOLIC PANEL - Abnormal; Notable for the  following:    Sodium 134 (*)    Potassium 3.3 (*)    Chloride 88 (*)    Glucose, Bld 101 (*)  Albumin 2.4 (*)    AST 112 (*)    Alkaline Phosphatase 242 (*)    Total Bilirubin 5.9 (*)    All other components within normal limits  LIPASE, BLOOD - Abnormal; Notable for the following:    Lipase 8 (*)    All other components within normal limits  URINALYSIS, ROUTINE W REFLEX MICROSCOPIC - Abnormal; Notable for the following:    Color, Urine BROWN (*)    APPearance CLOUDY (*)    Glucose, UA 100 (*)    Bilirubin Urine LARGE (*)    Ketones, ur 15 (*)    Protein, ur 100 (*)    Urobilinogen, UA >8.0 (*)    Nitrite POSITIVE (*)    Leukocytes, UA TRACE (*)    All other components within normal limits  SEDIMENTATION RATE - Abnormal; Notable for the following:    Sed Rate 73 (*)    All other components within normal limits  URINE MICROSCOPIC-ADD ON - Abnormal; Notable for the following:    Bacteria, UA MANY (*)    All other components within normal limits  URINE CULTURE    Imaging Review Dg Chest 1 View  03/19/2013   CLINICAL DATA:  Fall 1 week ago  EXAM: CHEST - 1 VIEW  COMPARISON:  11/01/2012  FINDINGS: The heart size and mediastinal contours are within normal limits. Both lungs are clear. The visualized skeletal structures are unremarkable.  IMPRESSION: No active disease.   Electronically Signed   By: Signa Kell M.D.   On: 03/19/2013 17:17   Dg Lumbar Spine Complete  03/19/2013   CLINICAL DATA:  Fall.  EXAM: LUMBAR SPINE - COMPLETE 4+ VIEW  COMPARISON:  None.  FINDINGS: There is no evidence of lumbar spine fracture. Alignment is normal. Disc space narrowing and ventral spurring is noted at L5-S1.  IMPRESSION: 1. No acute findings. 2. L5-S1 degenerative disc disease   Electronically Signed   By: Signa Kell M.D.   On: 03/19/2013 17:16   Dg Hip Complete Right  03/19/2013   CLINICAL DATA:  Fall.  One week ago  EXAM: RIGHT HIP - COMPLETE 2+ VIEW  COMPARISON:  None  FINDINGS:  Healed fracture deformity involving the proximal right femur is identified. There is no acute fracture or subluxation identified.  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  IMPRESSION: 1. No acute findings. 2. Healed right proximal femur fracture.   Electronically Signed   By: Signa Kell M.D.   On: 03/19/2013 17:14   Ct Abdomen Pelvis W Contrast  03/19/2013   CLINICAL DATA:  Fall 1 week ago with right-sided pain.  EXAM: CT ABDOMEN AND PELVIS WITH CONTRAST  TECHNIQUE: Multidetector CT imaging of the abdomen and pelvis was performed using the standard protocol following bolus administration of intravenous contrast.  CONTRAST:  OMNIPAQUE IOHEXOL 300 MG/ML  SOLN  COMPARISON:  04/29/2012  FINDINGS: Lung bases are clear.  Negative for free air.  There is diffuse low-attenuation of the liver consistent with hepatic steatosis. Liver is large for size. Scattered areas of fatty sparing throughout the liver. The gallbladder has been removed. Normal appearance of the spleen, adrenal glands, pancreas and both kidneys. There is chronic stranding in the retroperitoneum and right paracolic gutter. Incidentally, the patient has a retroaortic left renal vein. Stable stranding around the root of the IMA. No significant free fluid or lymphadenopathy. Atherosclerotic calcifications in the aorta and visceral vessels without aneurysmal dilatation. No gross abnormality to the uterus or adnexa tissue. Small amount  of fluid in the urinary bladder. No gross abnormality to the appendix. No acute bone abnormality.  IMPRESSION: Hepatomegaly with diffuse low-attenuation of the liver. Findings are suggestive for hepatic steatosis. Cirrhosis cannot be excluded.  There is chronic stranding in the abdomen, particularly in the retroperitoneum. Findings could represent prior inflammation or fibrosis.   Electronically Signed   By: Richarda Overlie M.D.   On: 03/19/2013 17:34     EKG Interpretation   None       MDM   1. Nausea  and vomiting in adult   2. Hyperbilirubinemia   3. UTI (lower urinary tract infection)   4. Leukocytosis   5. Anemia   6. Transaminitis    The pt appears dehydrated - she has vomiting actively in th ED and clothing was covered in emesis.  She has a tender abdomen but has not had a BM in 7 days - concern for bowel obstruction, needs evaluation with labs, imaging and will need supportive care with fluids / zofran, pain meds during evaluation.  Pt does have hx of hep C, alcohol use and has hx of macrocytic anemia likely to the prior - she has had hx of elevated transaminases as well as anemia.  Today she has several abnormalities including in the list is a leukocytosis, a hyperblirubinemia > 5 and a hypokalemia.  She has a UTI (cath specimen) which accounts for her dark urine on gross inspection.  Her anemia is slightly lower than usual, her SG is high and she has proteinuria and ketonuria.     Started Abx and Fluis, CT scan pending to eval for bowel obstruction - will need hemoccult of all stools.  D/w hospitalist - will admit.  Meds given in ED:  Medications  cefTRIAXone (ROCEPHIN) 1 g in dextrose 5 % 50 mL IVPB (not administered)  ondansetron (ZOFRAN) injection 4 mg (4 mg Intravenous Given 03/19/13 1606)  HYDROmorphone (DILAUDID) injection 1 mg (1 mg Intravenous Given 03/19/13 1606)  iohexol (OMNIPAQUE) 300 MG/ML solution 100 mL (100 mLs Intravenous Contrast Given 03/19/13 1647)      I personally performed the services described in this documentation, which was scribed in my presence. The recorded information has been reviewed and is accurate.      Vida Roller, MD 03/19/13 [phone]

## 2013-03-20 ENCOUNTER — Inpatient Hospital Stay (HOSPITAL_COMMUNITY): Payer: Medicare Other

## 2013-03-20 DIAGNOSIS — R112 Nausea with vomiting, unspecified: Secondary | ICD-10-CM

## 2013-03-20 DIAGNOSIS — R109 Unspecified abdominal pain: Secondary | ICD-10-CM

## 2013-03-20 DIAGNOSIS — R7989 Other specified abnormal findings of blood chemistry: Secondary | ICD-10-CM

## 2013-03-20 LAB — COMPREHENSIVE METABOLIC PANEL
AST: 93 U/L — ABNORMAL HIGH (ref 0–37)
Albumin: 2.4 g/dL — ABNORMAL LOW (ref 3.5–5.2)
Alkaline Phosphatase: 231 U/L — ABNORMAL HIGH (ref 39–117)
BUN: 11 mg/dL (ref 6–23)
Calcium: 8.3 mg/dL — ABNORMAL LOW (ref 8.4–10.5)
Chloride: 93 mEq/L — ABNORMAL LOW (ref 96–112)
Creatinine, Ser: 0.57 mg/dL (ref 0.50–1.10)
Total Bilirubin: 6 mg/dL — ABNORMAL HIGH (ref 0.3–1.2)
Total Protein: 7.1 g/dL (ref 6.0–8.3)

## 2013-03-20 LAB — CBC
HCT: 24.3 % — ABNORMAL LOW (ref 36.0–46.0)
Hemoglobin: 8 g/dL — ABNORMAL LOW (ref 12.0–15.0)
MCH: 38.8 pg — ABNORMAL HIGH (ref 26.0–34.0)
MCV: 118 fL — ABNORMAL HIGH (ref 78.0–100.0)
Platelets: 104 10*3/uL — ABNORMAL LOW (ref 150–400)
RBC: 2.06 MIL/uL — ABNORMAL LOW (ref 3.87–5.11)
RDW: 14.8 % (ref 11.5–15.5)
WBC: 9.9 10*3/uL (ref 4.0–10.5)

## 2013-03-20 LAB — T4, FREE: Free T4: 0.99 ng/dL (ref 0.80–1.80)

## 2013-03-20 LAB — TSH: TSH: 7.01 u[IU]/mL — ABNORMAL HIGH (ref 0.350–4.500)

## 2013-03-20 MED ORDER — SODIUM CHLORIDE 0.9 % IV SOLN
INTRAVENOUS | Status: AC
  Administered 2013-03-20: 17:00:00 via INTRAVENOUS

## 2013-03-20 MED ORDER — PROMETHAZINE HCL 25 MG/ML IJ SOLN
25.0000 mg | Freq: Three times a day (TID) | INTRAMUSCULAR | Status: DC | PRN
  Administered 2013-03-20 – 2013-03-22 (×5): 25 mg via INTRAVENOUS
  Filled 2013-03-20 (×5): qty 1

## 2013-03-20 MED ORDER — PANTOPRAZOLE SODIUM 40 MG IV SOLR
40.0000 mg | Freq: Two times a day (BID) | INTRAVENOUS | Status: DC
  Administered 2013-03-20 – 2013-03-22 (×4): 40 mg via INTRAVENOUS
  Filled 2013-03-20 (×4): qty 40

## 2013-03-20 NOTE — Progress Notes (Signed)
[facility] PROGRESS NOTE  [name] ZOX:[id number] DOB: Jan 25, 1961 DOA: 03/19/2013 PCP: Ernestine Conrad, MD  Assessment/Plan: Nausea/Vomiting -Much improved with initiation of phenergan. -Suspect related to UTI vs reflux disease. -For EGD in am.  Mechanical Fall -No identifiable injury. -PT/OT evals pending.  UTI -Continue rocephin pending cx data.  Abnormal LFTs -Appreciate GI input. -?ETOH-induced vs chronic hep C effect. -Follow.  Macrocytic Anemia -Check B12/folate. -If truly a heavy drinker, this could explain it as well.  Hypothyroidism -Continue synthroid.  H/o GBS -Followed by neurologist at [facility].  Code Status: Full Code Family Communication: Patient only  Disposition Plan: To be determined pending PT/OT evaluations.   Consultants:  GI   Antibiotics:  Rocephin 03/19/13-->   Subjective: States that her nausea improved after phenergan. Is sleepy.  Objective: Filed Vitals:   03/19/13 2031 03/20/13 0605 03/20/13 1339 03/20/13 1404  BP: 106/67 97/69 91/61  106/80  Pulse: 97 88 86 91  Temp: 98.2 F (36.8 C) 97.8 F (36.6 C)  98.1 F (36.7 C)  TempSrc: Oral Oral    Resp:  18    Height: 5\' 4"  (1.626 m)     Weight: 90.1 kg (198 lb 10.2 oz)     SpO2: 92% 95% 92% 95%    Intake/Output Summary (Last 24 hours) at 03/20/13 1610 Last data filed at 03/20/13 1200  Gross per 24 hour  Intake    440 ml  Output      0 ml  Net    440 ml   Filed Weights   03/19/13 1528 03/19/13 2031  Weight: 77.111 kg (170 lb) 90.1 kg (198 lb 10.2 oz)    Exam:   General:  AA Ox3  Cardiovascular: RRR  Respiratory: CTA B  Abdomen: S/NT/ND  Extremities: 1+ edema   Neurologic:  Non-focal.  Data Reviewed: Basic Metabolic Panel:  Recent Labs Lab 03/19/13 1601 03/20/13 0530  NA 134* 136  K 3.3* 4.4  CL 88* 93*  CO2 31 33*  GLUCOSE 101* 95  BUN 11 11  CREATININE 0.56 0.57  CALCIUM 8.5 8.3*   Liver Function Tests:  Recent Labs Lab  03/19/13 1601 03/19/13 2042 03/20/13 0530  AST 112*  --  93*  ALT 31  --  29  ALKPHOS 242*  --  231*  BILITOT 5.9* 5.3* 6.0*  PROT 7.3  --  7.1  ALBUMIN 2.4*  --  2.4*    Recent Labs Lab 03/19/13 1601  LIPASE 8*    Recent Labs Lab 03/19/13 2042  AMMONIA 46   CBC:  Recent Labs Lab 03/19/13 1601 03/20/13 0530  WBC 12.2* 9.9  NEUTROABS 9.8*  --   HGB 8.7* 8.0*  HCT 25.1* 24.3*  MCV 115.1* 118.0*  PLT 112* 104*   Cardiac Enzymes: No results found for this basename: CKTOTAL, CKMB, CKMBINDEX, TROPONINI,  in the last 168 hours BNP (last 3 results) No results found for this basename: PROBNP,  in the last 8760 hours CBG: No results found for this basename: GLUCAP,  in the last 168 hours  No results found for this or any previous visit (from the past 240 hour(s)).   Studies: Dg Chest 1 View  03/19/2013   CLINICAL DATA:  Fall 1 week ago  EXAM: CHEST - 1 VIEW  COMPARISON:  11/01/2012  FINDINGS: The heart size and mediastinal contours are within normal limits. Both lungs are clear. The visualized skeletal structures are unremarkable.  IMPRESSION: No active disease.   Electronically Signed   By: Ladona Ridgel  Bradly Chris M.D.   On: 03/19/2013 17:17   Dg Lumbar Spine Complete  03/19/2013   CLINICAL DATA:  Fall.  EXAM: LUMBAR SPINE - COMPLETE 4+ VIEW  COMPARISON:  None.  FINDINGS: There is no evidence of lumbar spine fracture. Alignment is normal. Disc space narrowing and ventral spurring is noted at L5-S1.  IMPRESSION: 1. No acute findings. 2. L5-S1 degenerative disc disease   Electronically Signed   By: Signa Kell M.D.   On: 03/19/2013 17:16   Dg Hip Complete Right  03/19/2013   CLINICAL DATA:  Fall.  One week ago  EXAM: RIGHT HIP - COMPLETE 2+ VIEW  COMPARISON:  None  FINDINGS: Healed fracture deformity involving the proximal right femur is identified. There is no acute fracture or subluxation identified.  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  IMPRESSION: 1. No acute  findings. 2. Healed right proximal femur fracture.   Electronically Signed   By: Signa Kell M.D.   On: 03/19/2013 17:14   Ct Abdomen Pelvis W Contrast  03/19/2013   CLINICAL DATA:  Fall 1 week ago with right-sided pain.  EXAM: CT ABDOMEN AND PELVIS WITH CONTRAST  TECHNIQUE: Multidetector CT imaging of the abdomen and pelvis was performed using the standard protocol following bolus administration of intravenous contrast.  CONTRAST:  OMNIPAQUE IOHEXOL 300 MG/ML  SOLN  COMPARISON:  04/29/2012  FINDINGS: Lung bases are clear.  Negative for free air.  There is diffuse low-attenuation of the liver consistent with hepatic steatosis. Liver is large for size. Scattered areas of fatty sparing throughout the liver. The gallbladder has been removed. Normal appearance of the spleen, adrenal glands, pancreas and both kidneys. There is chronic stranding in the retroperitoneum and right paracolic gutter. Incidentally, the patient has a retroaortic left renal vein. Stable stranding around the root of the IMA. No significant free fluid or lymphadenopathy. Atherosclerotic calcifications in the aorta and visceral vessels without aneurysmal dilatation. No gross abnormality to the uterus or adnexa tissue. Small amount of fluid in the urinary bladder. No gross abnormality to the appendix. No acute bone abnormality.  IMPRESSION: Hepatomegaly with diffuse low-attenuation of the liver. Findings are suggestive for hepatic steatosis. Cirrhosis cannot be excluded.  There is chronic stranding in the abdomen, particularly in the retroperitoneum. Findings could represent prior inflammation or fibrosis.   Electronically Signed   By: Richarda Overlie M.D.   On: 03/19/2013 17:34   US Abdomen Limited Ruq  03/20/2013   CLINICAL DATA:  Abnormal LFTs  EXAM: US ABDOMEN LIMITED - RIGHT UPPER QUADRANT  COMPARISON:  None.  FINDINGS: Gallbladder  Prior cholecystectomy.  Common bile duct  Diameter: Mildly increased measuring 6.9 mm.  Liver:   Diffusely increased echogenicity.  No focal mass lesion identified.  IMPRESSION: 1. Prior cholecystectomy. 2. Diffuse fatty infiltration of the liver.   Electronically Signed   By: Signa Kell M.D.   On: 03/20/2013 10:01    Scheduled Meds: . cefTRIAXone (ROCEPHIN)  IV  1 g Intravenous Q24H  . docusate sodium  100 mg Oral BID  . folic acid  1 mg Oral Daily  . levothyroxine  75 mcg Oral q morning - 10a  . pantoprazole (PROTONIX) IV  40 mg Intravenous Q12H  . polyethylene glycol  17 g Oral Daily  . potassium chloride SA  20 mEq Oral BID  . pregabalin  100 mg Oral TID  . thiamine  100 mg Oral Daily   Continuous Infusions:   Principal Problem:   Nausea and vomiting  in adult Active Problems:   Dehydration   Abnormal LFTs   Hypothyroid   UTI (urinary tract infection)   Hyperbilirubinemia   Macrocytic anemia   GBS (Guillain Barre syndrome)   Hepatitis C    Time spent: 35 minutes. Greater than 50% of this time was spent in direct contact with the patient coordinating care.    Chaya Jan  [facility] Pager [phone]  If 7PM-7AM, please contact night-coverage at [email or web address], password [facility] 03/20/2013, 4:10 PM  LOS: 1 day

## 2013-03-20 NOTE — Progress Notes (Signed)
Patient states that Zofran is not working and is making her more nausea worse.  Dr. Ardyth Harps paged.  Dr. Gwenlyn Perking returned page for Dr. Ardyth Harps.  He is covering for Dr. Ardyth Harps.  Dr. Gwenlyn Perking gave order for the patient to receive Phenergan 25 mg IV every eight hours as needed for nausea/vomiting.  Stated he will update Dr. Ardyth Harps.

## 2013-03-20 NOTE — Consult Note (Signed)
Referring Provider: Dr. Rito Ehrlich Primary Care Physician:  Ernestine Conrad, MD  Reason for Consultation:  Elevated LFTs  HPI: Pleasant, debilitated 52 year old lady with Guillain-barre syndrome, legally blind admitted yesterday with a one-week history of right-sided abdominal pain after a fall at home.  Nausea and vomiting which started about a day after her fall. She has diffuse right-sided body aches.No hematemesis, melena or hematochezia. No fever chills. Patient denies constipation and diarrhea. Total bilirubin 5-6 range, predominantly direct.  Alkaline phosphatase elevated in the 200 range. AST and ALT 93 and 29, respectively. Noted to have a lesser degree of similar of enzymes/bilirubin elevation earlier in the year. Completely normal LFTs back in 2009. Patient given a diagnosis of hepatitis C with lab work done previously. She does not know whether or not she has chronic infection. She's never been treated. She tells me she saw Dr. Karilyn Cota well over 10 years ago up in [place]. Describes having an EGD and colonoscopy up there for reasons which she cannot recall. This lady readily admits to drinking orange juice and vodka on a regular basis, predominantly multiple drinks over the weekend.  Had even more over the Thanksgiving weekend. Fatty liver/hepatomegaly on CT ultrasound borderline dilated bile duct 7 mm. Gallbladder out. No evidence of stone. No splenomegaly or varices the. She denies hematemesis, melena or hematochezia. No fever chills. Patient denies constipation and diarrhea. No hematemesis, melena or hematochezia. No fever chills. Patient denies constipation or diarrhea.  Has heartburn "all the time"-untreated. No dysphagia. Takes Advil multiple times daily for various aches and pains.   Patient states Zofran not helping  her nausea and feels it is making it worse. Wants Phenergan  - states this helped nausea better in the past.   Past Medical History  Diagnosis Date  . Guillain-Barre   . Blind    . Neuropathy   . Thyroid disease   . Back pain   . Arthritis   . Seizures   . UTI (lower urinary tract infection)   . Anxiety   . Depression   . DDD (degenerative disc disease), lumbar   . Hepatitis C     Past Surgical History  Procedure Laterality Date  . Cholecystectomy    . Tubal ligation    . Femur fracture surgery    . Ankle surgery    . Back surgery      Prior to Admission medications   Medication Sig Start Date End Date Taking? Authorizing Provider  cyanocobalamin (,VITAMIN B-12,) 1000 MCG/ML injection Inject 1,000 mcg into the muscle once a week. Sunday   Yes Historical Provider, MD  diclofenac sodium (VOLTAREN) 1 % GEL Apply 1 application topically 4 (four) times daily as needed. For neuropathy pains   Yes Historical Provider, MD  levothyroxine (SYNTHROID, LEVOTHROID) 75 MCG tablet Take 1 tablet (75 mcg total) by mouth every morning. 05/01/12  Yes Nimish Normajean Glasgow, MD  potassium chloride SA (K-DUR,KLOR-CON) 20 MEQ tablet Take 1 tablet (20 mEq total) by mouth 2 (two) times daily. 05/01/12  Yes Nimish Normajean Glasgow, MD  pregabalin (LYRICA) 100 MG capsule Take 100 mg by mouth 3 (three) times daily.    Yes Historical Provider, MD  thiamine 100 MG tablet Take 1 tablet (100 mg total) by mouth daily. 05/01/12  Yes Nimish Normajean Glasgow, MD    Current Facility-Administered Medications  Medication Dose Route Frequency Provider Last Rate Last Dose  . cefTRIAXone (ROCEPHIN) 1 g in dextrose 5 % 50 mL IVPB  1 g Intravenous Q24H Gokul  Rito Ehrlich, MD      . docusate sodium (COLACE) capsule 100 mg  100 mg Oral BID Osvaldo Shipper, MD      . folic acid (FOLVITE) tablet 1 mg  1 mg Oral Daily Osvaldo Shipper, MD      . HYDROmorphone (DILAUDID) injection 0.5 mg  0.5 mg Intravenous Q3H PRN Osvaldo Shipper, MD   0.5 mg at 03/20/13 0905  . levothyroxine (SYNTHROID, LEVOTHROID) tablet 75 mcg  75 mcg Oral q morning - 10a Osvaldo Shipper, MD      . ondansetron [facility]) tablet 4 mg  4 mg Oral Q6H PRN Osvaldo Shipper, MD       Or  . ondansetron (ZOFRAN) injection 4 mg  4 mg Intravenous Q6H PRN Osvaldo Shipper, MD   4 mg at 03/20/13 0915  . polyethylene glycol (MIRALAX / GLYCOLAX) packet 17 g  17 g Oral Daily Osvaldo Shipper, MD      . potassium chloride SA (K-DUR,KLOR-CON) CR tablet 20 mEq  20 mEq Oral BID Osvaldo Shipper, MD      . pregabalin (LYRICA) capsule 100 mg  100 mg Oral TID Osvaldo Shipper, MD      . thiamine (VITAMIN B-1) tablet 100 mg  100 mg Oral Daily Osvaldo Shipper, MD        Allergies as of 03/19/2013 - Review Complete 03/19/2013  Allergen Reaction Noted  . Neurontin [gabapentin] Anaphylaxis 07/25/2011  . Acetaminophen Itching and Nausea And Vomiting 07/25/2011  . Eggs or egg-derived products Nausea And Vomiting 07/25/2011  . Codeine Itching and Nausea And Vomiting 07/25/2011    History reviewed. No pertinent family history.  History   Social History  . Marital Status: Married    Spouse Name: N/A    Number of Children: N/A  . Years of Education: N/A   Occupational History  . Not on file.   Social History Main Topics  . Smoking status: Current Every Day Smoker -- 0.50 packs/day    Types: Cigarettes  . Smokeless tobacco: Not on file  . Alcohol Use: No  . Drug Use: No  . Sexual Activity: No   Other Topics Concern  . Not on file   Social History Narrative  . No narrative on file    Review of Systems: Gen: Denies any fever, chills, sweats, anorexia, weight loss, and sleep disorder CV: Denies chest pain, angina, palpitations, syncope, orthopnea, PND, peripheral edema, and claudication. GI:  Denies dysphagia or odynophagia. Derm: Denies rash, itching, dry skin, hives, moles, warts, or unhealing ulcers.  Psych: suicidal ideation, hallucinations, paranoia, and confusion. Heme: Denies bruising, bleeding, and enlarged lymph nodes.   Physical Exam: Vital signs in last 24 hours: Temp:  [97.8 F (36.6 C)-98.2 F (36.8 C)] 97.8 F (36.6 C) (12/07 0605) Pulse Rate:   [88-111] 88 (12/07 0605) Resp:  [18] 18 (12/07 0605) BP: (97-111)/(64-82) 97/69 mmHg (12/07 0605) SpO2:  [89 %-100 %] 95 % (12/07 0605) Weight:  [170 lb (77.111 kg)-198 lb 10.2 oz (90.1 kg)] 198 lb 10.2 oz (90.1 kg) (12/06 2031)   General:   Awake, conversant. Actively vomiting when I came to see her this morning; Nonbloody emesis   Normocephalic and atraumatic. Skin:   Sallow. Positive tattoos Ears:  Normal auditory acuity. Neck:  Supple; no masses or thyromegaly. Lungs:  Clear throughout to auscultation.   No wheezes, crackles, or rhonchi. No acute distress. Heart:  Regular rate and rhythm; no murmurs, clicks, rubs,  or gallops. Abdomen: Nondistended. Positive bowel sounds.  Liver  enlarged, palpates a good 5 fingerbreadths below the right costal margin. I do not appreciate a spleen. No shifting dullness or fluid wave. Pulses:  Normal pulses noted. Extremities:  Without clubbing or edema.  Lab Results:  Recent Labs  03/19/13 1601 03/20/13 0530  WBC 12.2* 9.9  HGB 8.7* 8.0*  HCT 25.1* 24.3*  PLT 112* 104*   BMET  Recent Labs  03/19/13 1601 03/20/13 0530  NA 134* 136  K 3.3* 4.4  CL 88* 93*  CO2 31 33*  GLUCOSE 101* 95  BUN 11 11  CREATININE 0.56 0.57  CALCIUM 8.5 8.3*   LFT  Recent Labs  03/19/13 2042 03/20/13 0530  PROT  --  7.1  ALBUMIN  --  2.4*  AST  --  93*  ALT  --  29  ALKPHOS  --  231*  BILITOT 5.3* 6.0*  BILIDIR 3.8*  --   IBILI 1.5*  --    PT/INR  Recent Labs  03/20/13 0530  LABPROT 15.2  INR 1.23   Studies/Results: Dg Chest 1 View  03/19/2013   CLINICAL DATA:  Fall 1 week ago  EXAM: CHEST - 1 VIEW  COMPARISON:  11/01/2012  FINDINGS: The heart size and mediastinal contours are within normal limits. Both lungs are clear. The visualized skeletal structures are unremarkable.  IMPRESSION: No active disease.   Electronically Signed   By: Signa Kell M.D.   On: 03/19/2013 17:17   Dg Lumbar Spine Complete  03/19/2013   CLINICAL DATA:   Fall.  EXAM: LUMBAR SPINE - COMPLETE 4+ VIEW  COMPARISON:  None.  FINDINGS: There is no evidence of lumbar spine fracture. Alignment is normal. Disc space narrowing and ventral spurring is noted at L5-S1.  IMPRESSION: 1. No acute findings. 2. L5-S1 degenerative disc disease   Electronically Signed   By: Signa Kell M.D.   On: 03/19/2013 17:16   Dg Hip Complete Right  03/19/2013   CLINICAL DATA:  Fall.  One week ago  EXAM: RIGHT HIP - COMPLETE 2+ VIEW  COMPARISON:  None  FINDINGS: Healed fracture deformity involving the proximal right femur is identified. There is no acute fracture or subluxation identified.  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  IMPRESSION: 1. No acute findings. 2. Healed right proximal femur fracture.   Electronically Signed   By: Signa Kell M.D.   On: 03/19/2013 17:14   Ct Abdomen Pelvis W Contrast  03/19/2013   CLINICAL DATA:  Fall 1 week ago with right-sided pain.  EXAM: CT ABDOMEN AND PELVIS WITH CONTRAST  TECHNIQUE: Multidetector CT imaging of the abdomen and pelvis was performed using the standard protocol following bolus administration of intravenous contrast.  CONTRAST:  OMNIPAQUE IOHEXOL 300 MG/ML  SOLN  COMPARISON:  04/29/2012  FINDINGS: Lung bases are clear.  Negative for free air.  There is diffuse low-attenuation of the liver consistent with hepatic steatosis. Liver is large for size. Scattered areas of fatty sparing throughout the liver. The gallbladder has been removed. Normal appearance of the spleen, adrenal glands, pancreas and both kidneys. There is chronic stranding in the retroperitoneum and right paracolic gutter. Incidentally, the patient has a retroaortic left renal vein. Stable stranding around the root of the IMA. No significant free fluid or lymphadenopathy. Atherosclerotic calcifications in the aorta and visceral vessels without aneurysmal dilatation. No gross abnormality to the uterus or adnexa tissue. Small amount of fluid in the urinary  bladder. No gross abnormality to the appendix. No acute bone abnormality.  IMPRESSION: Hepatomegaly with diffuse low-attenuation of the liver. Findings are suggestive for hepatic steatosis. Cirrhosis cannot be excluded.  There is chronic stranding in the abdomen, particularly in the retroperitoneum. Findings could represent prior inflammation or fibrosis.   Electronically Signed   By: Richarda Overlie M.D.   On: 03/19/2013 17:34   Impression:     Debilitated 52 year old lady with multiple medical problems along with regular alcohol and NSAID use admitted to the hospital with protracted nausea and vomiting after a fall. She complains of diffuse right-sided body pain related to falling. It is certainly possible she may have sustained some mild blunt force trauma to an already enlarged liver which could produce a bump in her liver enzymes and bilirubin. However, more likely current LFT picture more consistent with alcoholic-induced hepatitis.   No convincing imaging evidence for extrahepatic biliary process at this time. If she has chronic hepatitis C, then this infection could be a contributing factor as well. Patient was noted to have markedly elevated iron markers earlier this year. PBC or an autoimmune process seems less likely to me at this time. Nausea and vomiting-nonspecific in the setting of daily, uncontrolled reflux and NSAID use. I suppose she could have an underlying gastric motility disorder  Recommendations:   Watch for alcohol withdrawal syndrome.                                       Agree with trying Phenergan in lieu of Zofran to control nausea                                                             Will add acid suppression therapy for uncontrolled GERD symptoms                                       Diagnostic EGD tomorrow.The risks, benefits, limitations, alternatives and imponderables have                                       been reviewed with the patient. Potential for esophageal  dilation, biopsy, etc. have also been                                            reviewed.  Questions have been answered. All parties agreeable.                                       Extent of hepatology workup for elevated liver function studies to be determined pending review                                        of outside records.      Further recommendations to follow.

## 2013-03-21 ENCOUNTER — Encounter (HOSPITAL_COMMUNITY)
Admission: EM | Disposition: A | Discharge status: Home Health | Payer: Self-pay | Source: Home / Self Care | Attending: Family Medicine

## 2013-03-21 ENCOUNTER — Encounter (HOSPITAL_COMMUNITY): Payer: Self-pay | Admitting: *Deleted

## 2013-03-21 DIAGNOSIS — R112 Nausea with vomiting, unspecified: Secondary | ICD-10-CM

## 2013-03-21 DIAGNOSIS — K210 Gastro-esophageal reflux disease with esophagitis: Secondary | ICD-10-CM

## 2013-03-21 DIAGNOSIS — K259 Gastric ulcer, unspecified as acute or chronic, without hemorrhage or perforation: Secondary | ICD-10-CM

## 2013-03-21 HISTORY — PX: ESOPHAGOGASTRODUODENOSCOPY: SHX5428

## 2013-03-21 LAB — COMPREHENSIVE METABOLIC PANEL
ALT: 27 U/L (ref 0–35)
Alkaline Phosphatase: 211 U/L — ABNORMAL HIGH (ref 39–117)
BUN: 10 mg/dL (ref 6–23)
CO2: 32 mEq/L (ref 19–32)
Creatinine, Ser: 0.5 mg/dL (ref 0.50–1.10)
GFR calc Af Amer: >90 mL/min (ref >90)
GFR calc non Af Amer: >90 mL/min (ref >90)
Glucose, Bld: 77 mg/dL (ref 70–99)
Potassium: 3.7 mEq/L (ref 3.5–5.1)
Sodium: 137 mEq/L (ref 135–145)
Total Bilirubin: 6.5 mg/dL — ABNORMAL HIGH (ref 0.3–1.2)
Total Protein: 6.9 g/dL (ref 6.0–8.3)

## 2013-03-21 LAB — CBC
HCT: 21.8 % — ABNORMAL LOW (ref 36.0–46.0)
MCHC: 33 g/dL (ref 30.0–36.0)
Platelets: 88 10*3/uL — ABNORMAL LOW (ref 150–400)
RBC: 1.85 MIL/uL — ABNORMAL LOW (ref 3.87–5.11)
RDW: 15 % (ref 11.5–15.5)
WBC: 6.8 10*3/uL (ref 4.0–10.5)

## 2013-03-21 LAB — HEPATITIS B SURFACE ANTIGEN: Hepatitis B Surface Ag: NEGATIVE

## 2013-03-21 SURGERY — EGD (ESOPHAGOGASTRODUODENOSCOPY)
Anesthesia: Moderate Sedation

## 2013-03-21 MED ORDER — LORAZEPAM 2 MG/ML IJ SOLN
0.5000 mg | Freq: Once | INTRAMUSCULAR | Status: AC
  Administered 2013-03-21: 0.5 mg via INTRAVENOUS
  Filled 2013-03-21: qty 1

## 2013-03-21 MED ORDER — PROMETHAZINE HCL 25 MG/ML IJ SOLN
25.0000 mg | Freq: Once | INTRAMUSCULAR | Status: AC
  Administered 2013-03-21: 25 mg via INTRAVENOUS

## 2013-03-21 MED ORDER — BUTAMBEN-TETRACAINE-BENZOCAINE 2-2-14 % EX AERO
INHALATION_SPRAY | CUTANEOUS | Status: AC
  Administered 2013-03-21: 15:00:00
  Filled 2013-03-21: qty 56

## 2013-03-21 MED ORDER — MIDAZOLAM HCL 5 MG/5ML IJ SOLN
INTRAMUSCULAR | Status: DC | PRN
  Administered 2013-03-21: 2 mg via INTRAVENOUS
  Administered 2013-03-21: 1 mg via INTRAVENOUS

## 2013-03-21 MED ORDER — SODIUM CHLORIDE 0.9 % IV SOLN
INTRAVENOUS | Status: DC
  Administered 2013-03-21: 14:00:00 via INTRAVENOUS

## 2013-03-21 MED ORDER — STERILE WATER FOR IRRIGATION IR SOLN
Status: DC | PRN
  Administered 2013-03-21: 15:00:00

## 2013-03-21 MED ORDER — PROMETHAZINE HCL 25 MG/ML IJ SOLN
INTRAMUSCULAR | Status: AC
  Filled 2013-03-21: qty 1

## 2013-03-21 MED ORDER — MEPERIDINE HCL 100 MG/ML IJ SOLN
INTRAMUSCULAR | Status: DC | PRN
  Administered 2013-03-21: 25 mg via INTRAVENOUS
  Administered 2013-03-21: 50 mg via INTRAVENOUS

## 2013-03-21 MED ORDER — ONDANSETRON HCL 4 MG/2ML IJ SOLN
INTRAMUSCULAR | Status: AC
  Administered 2013-03-21: 15:00:00
  Filled 2013-03-21: qty 2

## 2013-03-21 MED ORDER — MEPERIDINE HCL 100 MG/ML IJ SOLN
INTRAMUSCULAR | Status: AC
  Administered 2013-03-21: 15:00:00
  Filled 2013-03-21: qty 2

## 2013-03-21 MED ORDER — BUTAMBEN-TETRACAINE-BENZOCAINE 2-2-14 % EX AERO
INHALATION_SPRAY | CUTANEOUS | Status: DC | PRN
  Administered 2013-03-21: 2 via TOPICAL

## 2013-03-21 MED ORDER — MIDAZOLAM HCL 5 MG/5ML IJ SOLN
INTRAMUSCULAR | Status: AC
  Administered 2013-03-21: 15:00:00
  Filled 2013-03-21: qty 10

## 2013-03-21 MED ORDER — SODIUM CHLORIDE 0.9 % IJ SOLN
INTRAMUSCULAR | Status: AC
  Administered 2013-03-21: 14:00:00
  Filled 2013-03-21: qty 10

## 2013-03-21 MED ORDER — SUCRALFATE 1 GM/10ML PO SUSP
1.0000 g | Freq: Three times a day (TID) | ORAL | Status: DC
  Administered 2013-03-21 – 2013-03-25 (×17): 1 g via ORAL
  Filled 2013-03-21 (×17): qty 10

## 2013-03-21 NOTE — Op Note (Signed)
[facility] [address] [place], [address]   ENDOSCOPY PROCEDURE REPORT  PATIENT: Brandy, Dalton  MR#: [id number] BIRTHDATE: [birth year] , 52  yrs. old GENDER: Female ENDOSCOPIST: R.  Roetta Sessions, MD FACP [facility] REFERRED BY:  Ernestine Conrad, M.D. PROCEDURE DATE:  03/21/2013 PROCEDURE:     EGD with gastric biopsy  INDICATIONS:    Nausea vomiting; NSAID/ETOH use; refractory GERD  INFORMED CONSENT:   The risks, benefits, limitations, alternatives and imponderables have been discussed.  The potential for biopsy, esophogeal dilation, etc. have also been reviewed.  Questions have been answered.  All parties agreeable.  Please see the history and physical in the medical record for more information.  MEDICATIONS:    Versed 3 mg IV and Demerol 75 mg IV in divided doses. Cetacaine spray. Phenergan 25 mg IV  DESCRIPTION OF PROCEDURE:   The EG-[id number] ([id number])  endoscope was introduced through the mouth and advanced to the second portion of the duodenum without difficulty or limitations.  The mucosal surfaces were surveyed very carefully during advancement of the scope and upon withdrawal.  Retroflexion view of the proximal stomach and esophagogastric junction was performed.      FINDINGS:   Rather severe eroded/denuded esophageal mucosa involving the distal one half of the tubular esophagus. No Barrett's esophagus. No varices.   Stomach slightly dilated with some bile stained fluid present. 2 cm hiatal hernia. Patient noted to have at least (2) 3 mm prepyloric gastric ulcers with surrounding edema producing encroachment on the lumen of pyloric channel. No obvious infiltrating process seen. The pylorus was traversed with the scope but only after encountering some resistance. The bulb and second portion appeared normal.  THERAPEUTIC / DIAGNOSTIC MANEUVERS PERFORMED:  biopsies abnormal gastric antral because of taken for histologic study   COMPLICATIONS:   None  IMPRESSION:   Severe erosive reflux esophagitis. Hiatal hernia. Gastric ulcer disease with secondary edema producing some encroachment on the pyloric channel producing an element of partial gastric outlet obstruction. No fixed obstruction, however.   NSAID effect likely.   RECOMMENDATIONS: Clear liquid diet. Twice a day PPI. Add Carafate 1 g 4 times a day. Follow up on pathology. Avoid alcohol and nonsteroidal agents.  Continue anti-medics and as needed.  Followup on viral markers. May need further outpatient hepatology evaluation.      _______________________________ R. Roetta Sessions, MD FACP [facility] eSigned:  R. Roetta Sessions, MD FACP [facility] 03/21/2013 3:23 PM     CC:  PATIENT NAME:  Brandy, Dalton MR#: [id number]

## 2013-03-21 NOTE — Progress Notes (Signed)
[facility] PROGRESS NOTE  [name] ZOX:[id number] DOB: 10/18/1960 DOA: 03/19/2013 PCP: Ernestine Conrad, MD  Assessment/Plan: Nausea/Vomiting -Much improved with initiation of phenergan. -Suspect related to UTI vs reflux disease. -For EGD today.  Mechanical Fall -No identifiable injury. -PT/OT evals pending.  UTI -Continue rocephin pending cx data. -Cx with E.Coli.  Abnormal LFTs -Appreciate GI input. -?ETOH-induced vs chronic hep C effect. -Follow.  Macrocytic Anemia -B12 /folate ok. -If truly a heavy drinker, this could explain it as well.  Hypothyroidism -Continue synthroid.  H/o GBS -Followed by neurologist at [facility].  Code Status: Full Code Family Communication: Patient only  Disposition Plan: To be determined pending PT/OT evaluations.   Consultants:  GI   Antibiotics:  Rocephin 03/19/13-->   Subjective: States that her nausea improved after phenergan. Is sleepy.  Objective: Filed Vitals:   03/21/13 1510 03/21/13 1515 03/21/13 1520 03/21/13 1525  BP: 102/64 113/94 98/53 100/65  Pulse: 102 105 103 100  Temp:      TempSrc:      Resp: 16 18 11 10   Height:      Weight:      SpO2: 85% 98% 98% 99%   No intake or output data in the 24 hours ending 03/21/13 1738 Filed Weights   03/19/13 1528 03/19/13 2031  Weight: 77.111 kg (170 lb) 90.1 kg (198 lb 10.2 oz)    Exam:   General:  AA Ox3  Cardiovascular: RRR  Respiratory: CTA B  Abdomen: S/NT/ND  Extremities: 1+ edema   Neurologic:  Non-focal.  Data Reviewed: Basic Metabolic Panel:  Recent Labs Lab 03/19/13 1601 03/20/13 0530 03/21/13 0548  NA 134* 136 137  K 3.3* 4.4 3.7  CL 88* 93* 95*  CO2 31 33* 32  GLUCOSE 101* 95 77  BUN 11 11 10   CREATININE 0.56 0.57 0.50  CALCIUM 8.5 8.3* 8.4   Liver Function Tests:  Recent Labs Lab 03/19/13 1601 03/19/13 2042 03/20/13 0530 03/21/13 0548  AST 112*  --  93* 87*  ALT 31  --  29 27  ALKPHOS 242*  --  231* 211*   BILITOT 5.9* 5.3* 6.0* 6.5*  PROT 7.3  --  7.1 6.9  ALBUMIN 2.4*  --  2.4* 2.3*    Recent Labs Lab 03/19/13 1601  LIPASE 8*    Recent Labs Lab 03/19/13 2042  AMMONIA 46   CBC:  Recent Labs Lab 03/19/13 1601 03/20/13 0530 03/21/13 0548  WBC 12.2* 9.9 6.8  NEUTROABS 9.8*  --   --   HGB 8.7* 8.0* 7.2*  HCT 25.1* 24.3* 21.8*  MCV 115.1* 118.0* 117.8*  PLT 112* 104* 88*   Cardiac Enzymes: No results found for this basename: CKTOTAL, CKMB, CKMBINDEX, TROPONINI,  in the last 168 hours BNP (last 3 results) No results found for this basename: PROBNP,  in the last 8760 hours CBG: No results found for this basename: GLUCAP,  in the last 168 hours  Recent Results (from the past 240 hour(s))  URINE CULTURE     Status: None   Collection Time    03/19/13  3:56 PM      Result Value Range Status   Specimen Description URINE, CATHETERIZED   Final   Special Requests NONE   Final   Culture  Setup Time     Final   Value: 03/19/2013 20:36     Performed at [facility] Count     Final   Value: >=100,000 COLONIES/ML  Performed at [facility]     Final   Value: ESCHERICHIA COLI     Performed at [facility]   Report Status PENDING   Incomplete     Studies: US Abdomen Limited Ruq  04/02/13   CLINICAL DATA:  Abnormal LFTs  EXAM: US ABDOMEN LIMITED - RIGHT UPPER QUADRANT  COMPARISON:  None.  FINDINGS: Gallbladder  Prior cholecystectomy.  Common bile duct  Diameter: Mildly increased measuring 6.9 mm.  Liver:  Diffusely increased echogenicity.  No focal mass lesion identified.  IMPRESSION: 1. Prior cholecystectomy. 2. Diffuse fatty infiltration of the liver.   Electronically Signed   By: Signa Kell M.D.   On: 04/02/13 10:01    Scheduled Meds: . cefTRIAXone (ROCEPHIN)  IV  1 g Intravenous Q24H  . docusate sodium  100 mg Oral BID  . folic acid  1 mg Oral Daily  . levothyroxine  75 mcg Oral q morning - 10a  . pantoprazole  (PROTONIX) IV  40 mg Intravenous Q12H  . polyethylene glycol  17 g Oral Daily  . potassium chloride SA  20 mEq Oral BID  . pregabalin  100 mg Oral TID  . promethazine      . sucralfate  1 g Oral TID WC & HS  . thiamine  100 mg Oral Daily   Continuous Infusions:   Principal Problem:   Nausea and vomiting in adult Active Problems:   Dehydration   Abnormal LFTs   Hypothyroid   UTI (urinary tract infection)   Hyperbilirubinemia   Macrocytic anemia   GBS (Guillain Barre syndrome)   Hepatitis C    Time spent: 35 minutes. Greater than 50% of this time was spent in direct contact with the patient coordinating care.    Chaya Jan  [facility] Pager [phone]  If 7PM-7AM, please contact night-coverage at [email or web address], password [facility] 03/21/2013, 5:38 PM  LOS: 2 days

## 2013-03-21 NOTE — Progress Notes (Signed)
EGD today with Dr. Jena Gauss. I spoke with patient briefly regarding prior evaluation for Hep C. She tells me it was done here in [place]. I see where there may have been a visit with Dorene Ar, NP with Dr. Karilyn Cota in 2012, but I am unable to open this visit or see if patient was actually seen at that time. Will attempt to retrieve any records. She is a vague historian and quite unclear regarding prior work-up of liver disease. She is quite nervous today. No prn medications available for anxiety. I have ordered a one time dose of Ativan 0.5mg  IV. Denies tremors. Does not appear to be alcohol withdrawal. Continue to monitor.

## 2013-03-21 NOTE — Progress Notes (Signed)
Utilization Review Complete  

## 2013-03-21 NOTE — Care Management Note (Addendum)
    Page 1 of 2   03/25/2013     2:05:06 PM   CARE MANAGEMENT NOTE 03/25/2013  Patient:  [name], [name]   Account Number:  [id number]  Date Initiated:  03/21/2013  Documentation initiated by:  Rosemary Holms  Subjective/Objective Assessment:   Pt not in room but CM spoke to her husbanc [name]. Per husband, pt was independent at home wiht assistance from a walker. Fell. He feels she would benefit from [facility] RN and PT at DC.     Action/Plan:   Will speak to pt.   Anticipated DC Date:  03/23/2013   Anticipated DC Plan:  HOME W HOME HEALTH SERVICES  In-house referral  Clinical Social Worker      DC Planning Services  CM consult      [facility] Choice  HOME HEALTH   Choice offered to / List presented to:  C-3 Spouse        HH arranged  HH-1 RN  HH-10 DISEASE MANAGEMENT  HH-2 PT  HH-4 NURSE'S AIDE      HH agency  [facility]   Status of service:  Completed, signed off Medicare Important Message given?   (If response is "NO", the following Medicare IM given date fields will be blank) Date Medicare IM given:   Date Additional Medicare IM given:    Discharge Disposition:    Per UR Regulation:    If discussed at Long Length of Stay Meetings, dates discussed:   03/24/2013    Comments:  03/25/13 Rosemary Holms RN BSN CM Pt decided to DC home. HH with [facility] at DC.  03/24/13 Xaden Kaufman RN BSN CM CSW is assisting in dc to SNF [facility]  03/23/13 Sibley Rolison RN BSN CM Per pt, she would like to be DC'd to home. [facility] selected for PT, RN and aide.  03/22/13 Leota Maka Leanord Hawking RN BSN CM Per PT, pt could benefit from SNF but pt most likely will elect to DC home with [facility].  03/21/13 Ramin Zoll RN BSN CM Prior client of [facility].

## 2013-03-22 DIAGNOSIS — K279 Peptic ulcer, site unspecified, unspecified as acute or chronic, without hemorrhage or perforation: Secondary | ICD-10-CM

## 2013-03-22 DIAGNOSIS — R748 Abnormal levels of other serum enzymes: Secondary | ICD-10-CM

## 2013-03-22 LAB — URINE CULTURE

## 2013-03-22 LAB — IRON AND TIBC
Iron: 133 ug/dL (ref 42–135)
UIBC: <15 ug/dL — ABNORMAL LOW (ref 125–400)

## 2013-03-22 LAB — HEPATIC FUNCTION PANEL
AST: 90 U/L — ABNORMAL HIGH (ref 0–37)
Bilirubin, Direct: 5.9 mg/dL — ABNORMAL HIGH (ref 0.0–0.3)
Indirect Bilirubin: 1.9 mg/dL — ABNORMAL HIGH (ref 0.3–0.9)
Total Bilirubin: 7.8 mg/dL — ABNORMAL HIGH (ref 0.3–1.2)

## 2013-03-22 LAB — HCV RNA QUANT RFLX ULTRA OR GENOTYP: HCV Quantitative: NOT DETECTED IU/mL — ABNORMAL LOW (ref <15)

## 2013-03-22 LAB — FERRITIN: Ferritin: 1348 ng/mL — ABNORMAL HIGH (ref 10–291)

## 2013-03-22 MED ORDER — CIPROFLOXACIN HCL 250 MG PO TABS
250.0000 mg | ORAL_TABLET | Freq: Two times a day (BID) | ORAL | Status: DC
  Administered 2013-03-22 – 2013-03-26 (×8): 250 mg via ORAL
  Filled 2013-03-22 (×8): qty 1

## 2013-03-22 MED ORDER — PROMETHAZINE HCL 25 MG/ML IJ SOLN
12.5000 mg | Freq: Three times a day (TID) | INTRAMUSCULAR | Status: DC
  Administered 2013-03-22 (×2): 12.5 mg via INTRAVENOUS
  Filled 2013-03-22 (×3): qty 1

## 2013-03-22 MED ORDER — PANTOPRAZOLE SODIUM 40 MG PO TBEC
40.0000 mg | DELAYED_RELEASE_TABLET | Freq: Two times a day (BID) | ORAL | Status: DC
  Administered 2013-03-22 – 2013-03-26 (×8): 40 mg via ORAL
  Filled 2013-03-22 (×8): qty 1

## 2013-03-22 NOTE — Progress Notes (Signed)
[facility] PROGRESS NOTE  [name] DGU:[id number] DOB: 08-05-1960 DOA: 03/19/2013 PCP: Ernestine Conrad, MD  Assessment/Plan: Nausea/Vomiting -Much improved with initiation of phenergan. -Suspect related to UTI vs reflux disease. -EGD with severe erosive reflux esophagitis and PUD with a partial gastric outlet obstruction. -Avoid ETOH and NSAIDS.  Mechanical Fall -No identifiable injury. -PT/OT evals pending.  UTI -Cx with E Coli. -Transition to cipro for 5 more days.  Abnormal LFTs -Appreciate GI input. -?ETOH-induced vs chronic hep C effect. -Appreciate GI input. -Follow.  Macrocytic Anemia -B12 /folate ok. -If truly a heavy drinker, this could explain it.  Hypothyroidism -Continue synthroid.  H/o GBS -Followed by neurologist at [facility].  Code Status: Full Code Family Communication: Patient only  Disposition Plan: SNF vs HH services. Suspect will be medically ready in 24-48 hours.   Consultants:  GI   Antibiotics:  Rocephin 03/19/13-->   Subjective: States that her nausea improved after phenergan. Is sleepy.  Objective: Filed Vitals:   03/21/13 1525 03/21/13 1959 03/22/13 0617 03/22/13 1247  BP: 100/65 106/70 110/75 106/71  Pulse: 100 98 98 110  Temp:  98.6 F (37 C) 98.6 F (37 C) 98.3 F (36.8 C)  TempSrc:  Oral Oral Oral  Resp: 10 16 16 17   Height:      Weight:      SpO2: 99% 96% 96% 96%    Intake/Output Summary (Last 24 hours) at 03/22/13 1541 Last data filed at 03/22/13 0800  Gross per 24 hour  Intake    600 ml  Output      0 ml  Net    600 ml   Filed Weights   03/19/13 1528 03/19/13 2031  Weight: 77.111 kg (170 lb) 90.1 kg (198 lb 10.2 oz)    Exam:   General:  AA Ox3  Cardiovascular: RRR  Respiratory: CTA B  Abdomen: S/NT/ND  Extremities: 1+ edema   Neurologic:  Non-focal.  Data Reviewed: Basic Metabolic Panel:  Recent Labs Lab 03/19/13 1601 03/20/13 0530 03/21/13 0548  NA 134* 136 137  K 3.3* 4.4 3.7   CL 88* 93* 95*  CO2 31 33* 32  GLUCOSE 101* 95 77  BUN 11 11 10   CREATININE 0.56 0.57 0.50  CALCIUM 8.5 8.3* 8.4   Liver Function Tests:  Recent Labs Lab 03/19/13 1601 03/19/13 2042 03/20/13 0530 03/21/13 0548 03/22/13 0922  AST 112*  --  93* 87* 90*  ALT 31  --  29 27 28   ALKPHOS 242*  --  231* 211* 202*  BILITOT 5.9* 5.3* 6.0* 6.5* 7.8*  PROT 7.3  --  7.1 6.9 7.4  ALBUMIN 2.4*  --  2.4* 2.3* 2.4*    Recent Labs Lab 03/19/13 1601  LIPASE 8*    Recent Labs Lab 03/19/13 2042  AMMONIA 46   CBC:  Recent Labs Lab 03/19/13 1601 03/20/13 0530 03/21/13 0548  WBC 12.2* 9.9 6.8  NEUTROABS 9.8*  --   --   HGB 8.7* 8.0* 7.2*  HCT 25.1* 24.3* 21.8*  MCV 115.1* 118.0* 117.8*  PLT 112* 104* 88*   Cardiac Enzymes: No results found for this basename: CKTOTAL, CKMB, CKMBINDEX, TROPONINI,  in the last 168 hours BNP (last 3 results) No results found for this basename: PROBNP,  in the last 8760 hours CBG: No results found for this basename: GLUCAP,  in the last 168 hours  Recent Results (from the past 240 hour(s))  URINE CULTURE     Status: None   Collection Time  03/19/13  3:56 PM      Result Value Range Status   Specimen Description URINE, CATHETERIZED   Final   Special Requests NONE   Final   Culture  Setup Time     Final   Value: 03/19/2013 20:36     Performed at [facility]   Colony Count     Final   Value: >=100,000 COLONIES/ML     Performed at [facility]   Culture     Final   Value: ESCHERICHIA COLI     Performed at [facility]   Report Status 03/22/2013 FINAL   Final   Organism ID, Bacteria ESCHERICHIA COLI   Final     Studies: No results found.  Scheduled Meds: . cefTRIAXone (ROCEPHIN)  IV  1 g Intravenous Q24H  . docusate sodium  100 mg Oral BID  . folic acid  1 mg Oral Daily  . levothyroxine  75 mcg Oral q morning - 10a  . pantoprazole (PROTONIX) IV  40 mg Intravenous Q12H  . polyethylene glycol  17 g Oral  Daily  . potassium chloride SA  20 mEq Oral BID  . pregabalin  100 mg Oral TID  . sucralfate  1 g Oral TID WC & HS  . thiamine  100 mg Oral Daily   Continuous Infusions:   Principal Problem:   Nausea and vomiting in adult Active Problems:   Dehydration   Abnormal LFTs   Hypothyroid   UTI (urinary tract infection)   Hyperbilirubinemia   Macrocytic anemia   GBS (Guillain Barre syndrome)   Hepatitis C    Time spent: 35 minutes. Greater than 50% of this time was spent in direct contact with the patient coordinating care.    Chaya Jan  [facility] Pager [phone]  If 7PM-7AM, please contact night-coverage at [email or web address], password [facility] 03/22/2013, 3:41 PM  LOS: 3 days

## 2013-03-22 NOTE — Clinical Social Work Psychosocial (Signed)
    Clinical Social Work Department BRIEF PSYCHOSOCIAL ASSESSMENT 03/22/2013  Patient:  [name], [name]     Account Number:  [id number]     Admit date:  03/19/2013  Clinical Social Worker:  Santa Genera, CLINICAL SOCIAL WORKER  Date/Time:  03/22/2013 11:00 AM  Referred by:  Physician  Date Referred:  03/22/2013 Referred for  Behavioral Health Issues   Other Referral:   Interview type:  Patient Other interview type:    PSYCHOSOCIAL DATA Living Status:  HUSBAND Admitted from facility:   Level of care:   Primary support name:  Sam Stubblefield Primary support relationship to patient:  SPOUSE Degree of support available:   Supportive spouse, has friend who also comes in to assist patient w ADLs    CURRENT CONCERNS Current Concerns  Behavioral Health Issues   Other Concerns:    SOCIAL WORK ASSESSMENT / PLAN CSW met w patient at bedside, patient states she is in pain, CSW spoke w RN who got medications for patient. Patient oriented x4.  Administered PHQ-9 Depression scale, patient scored 21 (score of 20 or more indicates severe major depression).  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, left work in 2008 due to increasing inability to manage work - had hospitalizations which eventually resulted in diagnosis of chronic Guillian Barre syndrome.  Patient says her pain is often severe, limits her ability to walk due to "legs jumping", "loss of balance", has had several falls, walks w walker.    Patient wants treatment for depression, says she was on Pristiq in past but her current drug plan will not pay for it.  Felt better on ADP.  Willing to consider referral to psychiatrist for medications management.  Would also like referral to pain management clinic, says she used to go to clinic in [place] but stopped approx 8 months ago "because I  didnt like being on all that stuff, it made me feel bad." Is willing to consider referral to pain management clinic locally in [place] area.    Says her husband is supportive but works during day.  Has friend who comes in for a few hours each AM.  Used to have CAP aide while was Medicaid eligible, but is now ineligible due to income restrictions.    CSW will investigate referral to psychiatrist in [facility], will also discuss need for referral to pain clinic w MD. Discussed possibilities for increased social support through connection w other patients w chronic diseases - patient says she does not have internet access at home so has not been able to utilize these kinds of supports/resources.   Assessment/plan status:  Psychosocial Support/Ongoing Assessment of Needs Other assessment/ plan:   Information/referral to community resources:   [facility], [facility]  Possible pain clinic referral    PATIENT'S/FAMILY'S RESPONSE TO PLAN OF CARE: Patient asked CSW to return later, willing to accept referral to psychiatrist.     Santa Genera, LCSW Clinical Social Worker [phone])

## 2013-03-22 NOTE — Evaluation (Signed)
Physical Therapy Evaluation Patient Details Name: [name] MRN: [id number] DOB: Sep 03, 1960 Today's Date: 03/22/2013 Time: 4098-1191 PT Time Calculation (min): 45 min  PT Assessment / Plan / Recommendation History of Present Illness   Pt was admitted with nausea/vomiting.  She is morbidly obese with a hx of Guillaine Barre Syndrome (s/p 4 years), hepatitic C OA and legal bilindness.  She lives with husband and had been ambulatory in the home with  A walker PTA.  Clinical Impression   Pt reports general malaise and generalized pain.  It took 2 visits before she would agree to work with me.  Pt  Was somewhat difficult to evaluate to to inconsistent reporting on her part and inconsistencies in muscle test.  She was able to move all major muscle groups against gravity, although with muscle test she would frequently resist any given direction of motion.  She becomes very emotional and agitated when attempting to transfer OOB and she ultimately needed max assist just to complete the motion.  She refused to be up to recliner.  Pt did express having depression and my sense is that much of her mobility problems are emotionally based.  However, because she is so limited by poor mobility, she could benefit from SNF.  She is unsure if she wants to do this or have HHPT.    PT Assessment  Patient needs continued PT services    Follow Up Recommendations  Home health PT;SNF (based on pt preference)    Does the patient have the potential to tolerate intense rehabilitation      Barriers to Discharge Decreased caregiver support      Equipment Recommendations  None recommended by PT    Recommendations for Other Services OT consult   Frequency Min 3X/week    Precautions / Restrictions Precautions Precautions: Fall Restrictions Weight Bearing Restrictions: No   Pertinent Vitals/Pain       Mobility  Bed Mobility Bed Mobility: Supine to Sit;Sit to Supine Supine to Sit: 4: Min assist;HOB  elevated Sit to Supine: 4: Min guard Details for Bed Mobility Assistance: pt is able to actively move both LEs in and out of bed Transfers Transfers: Sit to Stand;Stand to Sit;Stand Pivot Transfers Sit to Stand: 2: Max assist;With upper extremity assist;From bed Stand to Sit: 2: Max assist;To chair/3-in-1;To bed Stand Pivot Transfers: 2: Max assist Details for Transfer Assistance: pt becomes very emotional during pivot transfer and stands with full trunk flexion...she feels as though she is going to fall and is unable to follow through to complete the transfer. Ambulation/Gait Ambulation/Gait Assistance: Not tested (comment)    Exercises     PT Diagnosis: Difficulty walking;Generalized weakness  PT Problem List: Decreased strength;Decreased activity tolerance;Decreased mobility;Impaired sensation;Obesity PT Treatment Interventions: Functional mobility training;Therapeutic activities;Therapeutic exercise;Patient/family education     PT Goals(Current goals can be found in the care plan section) Acute Rehab PT Goals Patient Stated Goal: none stated PT Goal Formulation: With patient Time For Goal Achievement: 04/05/13 Potential to Achieve Goals: Fair  Visit Information  Last PT Received On: 03/22/13       Prior Functioning  Home Living Family/patient expects to be discharged to:: Skilled nursing facility Additional Comments: pt is unsure if she will return home or to SNF.  Husband works daytime. Prior Function Level of Independence: Needs assistance Gait / Transfers Assistance Needed: pt reports able to ambulate short distances in the home with a walker.     she usually transfers independently ADL's / Homemaking Assistance Needed: needs  assist with bathing and dressing Communication Communication: No difficulties    Cognition  Cognition Arousal/Alertness: Awake/alert Behavior During Therapy: WFL for tasks assessed/performed Overall Cognitive Status: Within Functional Limits  for tasks assessed    Extremity/Trunk Assessment Lower Extremity Assessment Lower Extremity Assessment: RLE deficits/detail;LLE deficits/detail RLE Deficits / Details: very difficult to get a definitive sense of muscle strength...pt is very inconsistent with ability to move any given muscle.    When asked to move one muscle she will actively resist with opposing muscle.  When then asked to move that opposing muscle, she will actively resist with the 1st muscle.   RLE Sensation: history of peripheral neuropathy LLE Deficits / Details: see not for RLE LLE Sensation: history of peripheral neuropathy   Balance Balance Balance Assessed: Yes Dynamic Sitting Balance Dynamic Sitting - Balance Support: No upper extremity supported;Feet supported Dynamic Sitting - Level of Assistance: 6: Modified independent (Device/Increase time)  End of Session PT - End of Session Equipment Utilized During Treatment: Gait belt Activity Tolerance: Treatment limited secondary to agitation Patient left: in bed;with call bell/phone within reach;with bed alarm set Nurse Communication: Mobility status  GP     Konrad Penta 03/22/2013, 2:11 PM

## 2013-03-22 NOTE — Progress Notes (Signed)
Subjective: Continued nausea, improved slightly since admission but "not much" per patient. Has a "real bad nervous stomach". Right lateral side is "sore" from the fall. States some vomiting after dinner last night. Last colonoscopy in remote past. Endorses depression, stating she does not have family around here, feels stuck in her house, not much to do. Interested in seeking therapy. Denies any prior work-up for liver disease.   Objective: Vital signs in last 24 hours: Temp:  [98.6 F (37 C)-99.4 F (37.4 C)] 98.6 F (37 C) (12/09 0617) Pulse Rate:  [98-108] 98 (12/09 0617) Resp:  [10-18] 16 (12/09 0617) BP: (97-123)/(48-103) 110/75 mmHg (12/09 0617) SpO2:  [85 %-99 %] 96 % (12/09 0617) Last BM Date: 03/15/13 General:   Alert and oriented, tearful and anxious.  Head:  Normocephalic and atraumatic. Eyes:  Icterus noted  Heart:  S1, S2 present, no murmurs noted.  Lungs: Clear to auscultation bilaterally, without wheezing, rales, or rhonchi.  Abdomen:  Bowel sounds present, soft, diffusely tender with only mild palpation but no rebound or guarding. Hepatomegaly Extremities:  Without clubbing or edema. Neurologic:  Alert and  oriented x4;  grossly normal neurologically. Psych:  Alert and cooperative. Flat affect.   Intake/Output from previous day:   Intake/Output this shift:    Lab Results:  Recent Labs  03/19/13 1601 03/20/13 0530 03/21/13 0548  WBC 12.2* 9.9 6.8  HGB 8.7* 8.0* 7.2*  HCT 25.1* 24.3* 21.8*  PLT 112* 104* 88*   BMET  Recent Labs  03/19/13 1601 03/20/13 0530 03/21/13 0548  NA 134* 136 137  K 3.3* 4.4 3.7  CL 88* 93* 95*  CO2 31 33* 32  GLUCOSE 101* 95 77  BUN 11 11 10   CREATININE 0.56 0.57 0.50  CALCIUM 8.5 8.3* 8.4   LFT  Recent Labs  03/19/13 1601 03/19/13 2042 03/20/13 0530 03/21/13 0548  PROT 7.3  --  7.1 6.9  ALBUMIN 2.4*  --  2.4* 2.3*  AST 112*  --  93* 87*  ALT 31  --  29 27  ALKPHOS 242*  --  231* 211*  BILITOT 5.9* 5.3* 6.0*  6.5*  BILIDIR  --  3.8*  --   --   IBILI  --  1.5*  --   --    PT/INR  Recent Labs  03/20/13 0530  LABPROT 15.2  INR 1.23   Hepatitis Panel  Recent Labs  03/21/13 1020  HEPBSAG NEGATIVE    Studies/Results: US Abdomen Limited Ruq  03/20/2013   CLINICAL DATA:  Abnormal LFTs  EXAM: US ABDOMEN LIMITED - RIGHT UPPER QUADRANT  COMPARISON:  None.  FINDINGS: Gallbladder  Prior cholecystectomy.  Common bile duct  Diameter: Mildly increased measuring 6.9 mm.  Liver:  Diffusely increased echogenicity.  No focal mass lesion identified.  IMPRESSION: 1. Prior cholecystectomy. 2. Diffuse fatty infiltration of the liver.   Electronically Signed   By: Signa Kell M.D.   On: 03/20/2013 10:01    Assessment: 52 year old female with multiple medical comorbidities, admitted with elevated LFTs, nausea, vomiting, found to have severe erosive reflux esophagitis and PUD causing some mild element of partial gastric outlet obstruction but still patent. This is likely secondary to NSAID use.   Elevated LFTs: denies prior work-up. Appears all blood work done through [facility] in the past. Question alcohol-induced, possible chronic Hep C, autoimmune or PBC unable to be excluded but less likely. Discriminant function 22. Will draw serologies for further assessment of underlying liver disease. In the past,  iron and ferritin markedly elevated, which could be contributed to disease process, alcohol use. Will update now as well.   Nausea/Vomiting: improved since admission. Secondary to PUD. Unable to exclude underlying motility disorder. Continue clear liquids for now.    Plan: Follow-up on pathology Await pending HCV RNA Further serologies ordered (AMA, ANA, ASMA, immunoglobulins) Add iron and ferritin Repeat HFP today Absolute avoidance of NSAIDs and alcohol BID PPI Short course of Carafate QID Consult to social work: discuss mental health services available in [facility] Outpatient  colonoscopy   LOS: 3 days    03/22/2013, 8:34 AM

## 2013-03-22 NOTE — Progress Notes (Signed)
REVIEWED. AGREE. 

## 2013-03-23 DIAGNOSIS — K279 Peptic ulcer, site unspecified, unspecified as acute or chronic, without hemorrhage or perforation: Secondary | ICD-10-CM

## 2013-03-23 DIAGNOSIS — K209 Esophagitis, unspecified without bleeding: Secondary | ICD-10-CM

## 2013-03-23 DIAGNOSIS — D62 Acute posthemorrhagic anemia: Secondary | ICD-10-CM

## 2013-03-23 LAB — COMPREHENSIVE METABOLIC PANEL
ALT: 24 U/L (ref 0–35)
BUN: 7 mg/dL (ref 6–23)
CO2: 31 mEq/L (ref 19–32)
Calcium: 8.4 mg/dL (ref 8.4–10.5)
Creatinine, Ser: 0.5 mg/dL (ref 0.50–1.10)
GFR calc Af Amer: >90 mL/min (ref >90)
GFR calc non Af Amer: >90 mL/min (ref >90)
Glucose, Bld: 97 mg/dL (ref 70–99)
Total Protein: 6.4 g/dL (ref 6.0–8.3)

## 2013-03-23 LAB — CBC
HCT: 17.8 % — ABNORMAL LOW (ref 36.0–46.0)
Hemoglobin: 5.9 g/dL — CL (ref 12.0–15.0)
MCHC: 33.1 g/dL (ref 30.0–36.0)
Platelets: 88 10*3/uL — ABNORMAL LOW (ref 150–400)
RBC: 1.51 MIL/uL — ABNORMAL LOW (ref 3.87–5.11)
WBC: 7.5 10*3/uL (ref 4.0–10.5)

## 2013-03-23 LAB — IGG, IGA, IGM
IgA: 1110 mg/dL — ABNORMAL HIGH (ref 69–380)
IgG (Immunoglobin G), Serum: 1980 mg/dL — ABNORMAL HIGH (ref 690–1700)
IgM, Serum: 201 mg/dL (ref 52–322)

## 2013-03-23 LAB — ANA: Anti Nuclear Antibody(ANA): NEGATIVE

## 2013-03-23 LAB — PREPARE RBC (CROSSMATCH)

## 2013-03-23 MED ORDER — SENNA 8.6 MG PO TABS
1.0000 | ORAL_TABLET | Freq: Every day | ORAL | Status: DC
  Administered 2013-03-23 – 2013-03-24 (×2): 8.6 mg via ORAL
  Filled 2013-03-23: qty 1

## 2013-03-23 MED ORDER — ONDANSETRON HCL 4 MG PO TABS
4.0000 mg | ORAL_TABLET | Freq: Four times a day (QID) | ORAL | Status: DC | PRN
  Administered 2013-03-24 – 2013-03-25 (×2): 4 mg via ORAL
  Filled 2013-03-23 (×2): qty 1

## 2013-03-23 MED ORDER — MILK AND MOLASSES ENEMA
Freq: Once | RECTAL | Status: AC
  Administered 2013-03-23: 09:00:00 via RECTAL

## 2013-03-23 MED ORDER — ONDANSETRON HCL 4 MG/2ML IJ SOLN
4.0000 mg | Freq: Four times a day (QID) | INTRAMUSCULAR | Status: DC | PRN
  Administered 2013-03-23 – 2013-03-26 (×6): 4 mg via INTRAVENOUS
  Filled 2013-03-23 (×7): qty 2

## 2013-03-23 MED ORDER — PROMETHAZINE HCL 25 MG/ML IJ SOLN
6.2500 mg | Freq: Three times a day (TID) | INTRAMUSCULAR | Status: DC
  Administered 2013-03-23 – 2013-03-24 (×4): 6.25 mg via INTRAVENOUS
  Filled 2013-03-23 (×4): qty 1

## 2013-03-23 MED ORDER — HYDROMORPHONE HCL PF 1 MG/ML IJ SOLN
0.5000 mg | INTRAMUSCULAR | Status: DC | PRN
  Administered 2013-03-23 – 2013-03-24 (×6): 0.5 mg via INTRAVENOUS
  Filled 2013-03-23 (×6): qty 1

## 2013-03-23 NOTE — Clinical Social Work Placement (Signed)
    Clinical Social Work Department CLINICAL SOCIAL WORK PLACEMENT NOTE 03/24/2013  Patient:  [name], [name]  Account Number:  [id number] Admit date:  03/19/2013  Clinical Social Worker:  Santa Genera, CLINICAL SOCIAL WORKER  Date/time:  03/23/2013 09:00 AM  Clinical Social Work is seeking post-discharge placement for this patient at the following level of care:   SKILLED NURSING   (*CSW will update this form in Epic as items are completed)   03/23/2013  Patient/family provided with [facility] Department of Clinical Social Work's list of facilities offering this level of care within the geographic area requested by the patient (or if unable, by the patient's family).  03/23/2013  Patient/family informed of their freedom to choose among providers that offer the needed level of care, that participate in Medicare, Medicaid or managed care program needed by the patient, have an available bed and are willing to accept the patient.  03/23/2013  Patient/family informed of [facility]' ownership interest in [facility], as well as of the fact that they are under no obligation to receive care at this facility.  PASARR submitted to EDS on 03/23/2013 PASARR number received from [employer] on 03/23/2013  FL2 transmitted to all facilities in geographic area requested by pt/family on  03/23/2013 FL2 transmitted to all facilities within larger geographic area on   Patient informed that his/her managed care company has contracts with or will negotiate with  certain facilities, including the following:     Patient/family informed of bed offers received:  03/24/2013 Patient chooses bed at [facility] Physician recommends and patient chooses bed at    Patient to be transferred to  on   Patient to be transferred to facility by   The following physician request were entered in Epic:   Additional Comments:  Santa Genera, LCSW Clinical Social Worker [phone])

## 2013-03-23 NOTE — Clinical Social Work Note (Signed)
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  SBIRT total score is 2, w 0 for dependence and 0 for alcohol related problem score.  CSW did not leave materials related to alcohol use, encouraged complete abstinence.  Patient aware that MD has recommended no alcohol consumption at all.  CSW also discussed PT recommendation of SNF vs HH PT.  Patient prefers to return home w "3 hours a day of help that [employer] gives."  CSW is unaware of any [employer] program other than CAP aides, patient says this is different from CAP.  CSW will contact [employer] to see if additional help is available to patient.  Patient says she "does not want to go home until I feel well", CSW spoke w patient about possible need for SNF rehab at discharge if patient does not feel ready to return home.  Patient agreeable to bed search, and will make decision about returning home vs going to SNF rehab when at discharge.  Santa Genera, LCSW Clinical Social Worker [phone])

## 2013-03-23 NOTE — Progress Notes (Signed)
Lab called with critical Hg 5.9. Patient nurse, Quita Skye, RN notified.

## 2013-03-23 NOTE — Progress Notes (Signed)
Pt tolerated milk and molasses enema well.  Pt retained enema for approximately 15 minutes and had a large dark brown BM.  No bright red blood noted.

## 2013-03-23 NOTE — Progress Notes (Signed)
[facility] PROGRESS NOTE  [name] WJX:[id number] DOB: Jul 04, 1960 DOA: 03/19/2013 PCP: Ernestine Conrad, MD  Assessment/Plan: 1. Nausea, vomiting: In the setting of uncontrolled reflux, daily NSAID use and alcohol use. Improved with Phenergan. Possible contributed to by UTI. EGD revealed severe erosive reflux esophagitis, peptic ulcer disease with partial gastric outlet obstruction  2. Severe erosive reflux esophagitis, peptic ulcer disease with partial gastric outlet obstruction: History of ongoing alcohol use and daily NSAID use. Continue PPI twice a day, Carafate. No alcohol or NSAIDS. Consider outpatient hepatology evaluation. 3. Acute blood loss anemia: Superimposed on macrocytic anemia: Vitamin B 12 and folate normal. Suspect secondary to subacute blood loss from peptic ulcer disease, NSAID use, alcohol use superimposed on anemia of chronic disease. Suspect anemia of acute illness. No evidence of active bleed. Discussed rationale for transfusion and recommendation the patient, she agreed to transfusion. Further evaluation per GI. 4. Hyperbilirubinemia, elevated LFTs: Likely multifactorial including alcohol-induced hepatitis, possible chronic hepatitis C however viral load was undetectable, hepatic steatosis. CT of the abdomen and pelvis suggested hepatic steatosis. Cirrhosis could not be excluded. Ultrasound confirmed diffuse fatty infiltration of the liver. Consideration given to the possibility of autoimmune disease, iron overload. Further evaluation and followup per gastroenterology. 5. Mechanical fall prior to admission: Imaging negative. No evidence of injury. Physical therapy evaluation noted. Occupational therapy consult pending. 6. UTI: Continue Cipro for Escherichia coli. 7. Thrombocytopenia: Appears to be chronic, intermittent. Likely related to liver disease. Stable. 8. Hypothyroidism: Continue Synthroid. TSH was elevated. T4 was normal. Followup as an outpatient with repeat  testing with condition improved. 9. History of GBS: Chronic lower extremity weakness 10. Legally blind 11. History of asthma 12. Current smoker 13. Ongoing alcohol use: No evidence of withdrawal. Social work Administrator, sports. 14. History of seizure disorder? 15. Severe depression: Social work has suggested outpatient followup.  16. Hypoxia? No signs or symptoms of respiratory infection or distress. Wean oxygen. If unable to wean, consider chest x-ray 17. Constipation: Bowel regimen. Discussed that her constipation and nausea as likely worsened by IV narcotics. We discussed rationale for weaning off IV narcotics. All pain she reported is chronic.   Transfuse 2 units packed red blood cells  CMP, CBC in the morning  Wean oxygen  Wean narcotics  Bowel regimen  Pending studies:     Code Status: full code DVT prophylaxis: SCDs Family Communication: none present Disposition Plan: HHPT vs. SNF  Brandy Sacks, MD  [facility]  Pager [phone] If 7PM-7AM, please contact night-coverage at [email or web address], password [facility] 03/23/2013, 7:28 AM  LOS: 4 days   Summary: 52 year old woman with history of hepatitis C, legal blindness, GBS who fell at home. Subsequently developed nausea, vomiting and abdominal pain at home. Dysuria. Poor oral intake. Constipated. Imaging was negative. She was admitted for nausea, vomiting, UTI, abnormal LFTs with hepatomegaly with hyperbilirubinemia. She was seen in consultation with gastroenterology and underwent an EGD which revealed severe erosive reflux esophagitis, peptic ulcer disease causing some element of partial gastric outlet obstruction secondary to NSAID use.  Consultants:  Gastroenterology  Physical therapy: Suspected much of mobility problems are emotionally based. HHPT vs. SNF  Procedures:  12/8 EGD: Severe erosive reflux esophagitis, hiatal hernia, gastric ulcer disease with secondary edema producing some encroachment on the pyloric  channel producing an element of partial gastric outlet obstruction. No fixed obstruction. Consent effect likely.  Antibiotics:  Ceftriaxone 12/6 >> 12/8  Ciprofloxacin 12/9 >> 12/12  HPI/Subjective: Complains of nausea. Vomited last night. No vomiting this morning.  Complains of chronic abdominal pain and chronic body aches from GBS. No new issues. Possible hypoxia recorded overnight.  Objective: Filed Vitals:   03/22/13 1247 03/22/13 2111 03/23/13 0533 03/23/13 0549  BP: 106/71 106/71    Pulse: 110 110 102   Temp: 98.3 F (36.8 C) 98.6 F (37 C) 98.4 F (36.9 C)   TempSrc: Oral Oral Oral   Resp: 17 16 16    Height:      Weight:      SpO2: 96% 91% 74% 94%    Intake/Output Summary (Last 24 hours) at 03/23/13 0728 Last data filed at 03/22/13 1850  Gross per 24 hour  Intake   1380 ml  Output      0 ml  Net   1380 ml     Filed Weights   03/19/13 1528 03/19/13 2031  Weight: 77.111 kg (170 lb) 90.1 kg (198 lb 10.2 oz)    Exam:   Afebrile, vital signs stable. Questionable hypoxia this morning.  General: Appears calm, comfortable, chronically ill and debilitated. Nontoxic. No acute distress.  Cardiovascular: Regular rate and rhythm. No murmur, rub or gallop.  Respiratory: Clear to auscultation bilaterally. No wheezes, rales or rhonchi. Normal respiratory effort.  Abdomen: Obese, distended, soft, nontender, nondistended. Positive bowel sounds.  Skin: Some bruising seen.  Psychiatric: Appears depressed. Speech fluent and clear.  Mouth: Oropharynx appears unremarkable.  Data Reviewed:  Basic metabolic panel notable for mild hyponatremia  Alkaline phosphatase improved from 168  AST trending downwards, 76  Total bilirubin without significant change, 7.9  Hemoglobin 5.9, platelet count 88  Scheduled Meds: . ciprofloxacin  250 mg Oral BID  . docusate sodium  100 mg Oral BID  . folic acid  1 mg Oral Daily  . levothyroxine  75 mcg Oral q morning - 10a  .  pantoprazole  40 mg Oral BID AC  . polyethylene glycol  17 g Oral Daily  . potassium chloride SA  20 mEq Oral BID  . pregabalin  100 mg Oral TID  . promethazine  12.5 mg Intravenous TID AC & HS  . sucralfate  1 g Oral TID WC & HS  . thiamine  100 mg Oral Daily   Continuous Infusions:   Principal Problem:   PUD (peptic ulcer disease) Active Problems:   Dehydration   Abnormal LFTs   Hypothyroid   UTI (urinary tract infection)   Nausea and vomiting in adult   Hyperbilirubinemia   Macrocytic anemia   GBS (Guillain Barre syndrome)   Hepatitis C   Acute esophagitis   Acute blood loss anemia   Time spent 35 minutes

## 2013-03-23 NOTE — Progress Notes (Addendum)
Subjective: Lower abdominal pain. Continued nausea but eating small amounts of food. Desires Phenergan, stating "this worked the best for me". No signs of hematemesis, melena, hematochezia. No confusion.   Objective: Vital signs in last 24 hours: Temp:  [98.3 F (36.8 C)-98.6 F (37 C)] 98.4 F (36.9 C) (12/10 0533) Pulse Rate:  [102-110] 102 (12/10 0533) Resp:  [16-17] 16 (12/10 0533) BP: (106)/(71) 106/71 mmHg (12/09 2111) SpO2:  [74 %-96 %] 94 % (12/10 0549) Last BM Date: 03/15/13 General:   Alert and oriented, flat affect Head:  Normocephalic and atraumatic. Eyes:  Scleral icterus Heart:  S1, S2 present  Lungs: Clear to auscultation bilaterally, without wheezing, rales, or rhonchi.  Abdomen:  Bowel sounds present, soft, mild TTP lower abdomen, significantly large AP diameter, obese, hepatomegaly.   Neurologic:  Alert and  oriented x4;  grossly normal neurologically.  Intake/Output from previous day: 12/09 0701 - 12/10 0700 In: 1380 [P.O.:1380] Out: -  Intake/Output this shift:    Lab Results:  Recent Labs  03/21/13 0548 03/23/13 0546  WBC 6.8 7.5  HGB 7.2* 5.9*  HCT 21.8* 17.8*  PLT 88* 88*   BMET  Recent Labs  03/21/13 0548 03/23/13 0546  NA 137 131*  K 3.7 3.6  CL 95* 91*  CO2 32 31  GLUCOSE 77 97  BUN 10 7  CREATININE 0.50 0.50  CALCIUM 8.4 8.4   LFT  Recent Labs  03/21/13 0548 03/22/13 0922 03/23/13 0546  PROT 6.9 7.4 6.4  ALBUMIN 2.3* 2.4* 2.1*  AST 87* 90* 76*  ALT 27 28 24   ALKPHOS 211* 202* 168*  BILITOT 6.5* 7.8* 7.9*  BILIDIR  --  5.9*  --   IBILI  --  1.9*  --    Hepatitis Panel  Recent Labs  03/21/13 1020  HEPBSAG NEGATIVE     Assessment: 52 year old female with multiple medical comorbidities, admitted with elevated LFTs, nausea, vomiting, found to have severe erosive reflux esophagitis and PUD causing some mild element of partial gastric outlet obstruction but still patent. Likely secondary to NSAID use.  Elevated  LFTs: HCV RNA undetectable. Further work-up for PBC, autoimmune process pending. Ferritin elevated non-specific in this setting of acute illness, ETOH use.   Acute blood loss anemia: Hgb 5.9 this morning, with last check 2 days ago at 7.2. She has no signs of overt GI bleeding. Question multifactorial at this time with contribution of PUD, ETOH use, acute illness. Agree with transfusion. Would closely monitor for any overt signs of bleeding. Hold off on invasive GI procedure now unless evidence of overt bleeding.   Nausea/Vomiting: improved since admission but still with poor intake. Secondary to PUD, but unable to exclude underlying motility disorder.   Constipation: no BM since 12/2, which could be a large contributor of lower abdominal discomfort. Milk and Molasses enema ordered per hospitalist. As she has not had ideal results with Miralax daily, would recommend an agent such as Linzess or Amitiza. Await results of enema. Consider starting today or 12/11.   Plan: Continue BID PPI Absolute avoidance of NSAIDs and alcohol Agree with 2 units PRBCs Agree with enema now: consider starting Linzess or Amitiza today or tomorrow Monitor H/H, watch for overt signs of GI bleeding Follow-up on pending serologies for underlying liver disease Outpatient colonoscopy Follow-up on pending path from EGD  Nira Retort, ANP-BC [facility] Gastroenterology     LOS: 4 days   Pt seen and examined; agree with above impression and recommendations  03/23/2013, 8:42  AM     

## 2013-03-24 ENCOUNTER — Encounter (HOSPITAL_COMMUNITY): Payer: Self-pay | Admitting: Internal Medicine

## 2013-03-24 DIAGNOSIS — R17 Unspecified jaundice: Secondary | ICD-10-CM

## 2013-03-24 DIAGNOSIS — K279 Peptic ulcer, site unspecified, unspecified as acute or chronic, without hemorrhage or perforation: Secondary | ICD-10-CM

## 2013-03-24 DIAGNOSIS — R7989 Other specified abnormal findings of blood chemistry: Secondary | ICD-10-CM

## 2013-03-24 DIAGNOSIS — K5900 Constipation, unspecified: Secondary | ICD-10-CM

## 2013-03-24 LAB — COMPREHENSIVE METABOLIC PANEL
AST: 75 U/L — ABNORMAL HIGH (ref 0–37)
BUN: 6 mg/dL (ref 6–23)
CO2: 30 mEq/L (ref 19–32)
Calcium: 8.5 mg/dL (ref 8.4–10.5)
Creatinine, Ser: 0.41 mg/dL — ABNORMAL LOW (ref 0.50–1.10)
GFR calc Af Amer: >90 mL/min (ref >90)
GFR calc non Af Amer: >90 mL/min (ref >90)
Glucose, Bld: 79 mg/dL (ref 70–99)
Sodium: 131 mEq/L — ABNORMAL LOW (ref 135–145)
Total Bilirubin: 10.8 mg/dL — ABNORMAL HIGH (ref 0.3–1.2)
Total Protein: 6.8 g/dL (ref 6.0–8.3)

## 2013-03-24 LAB — TYPE AND SCREEN
ABO/RH(D): B POS
Unit division: 0

## 2013-03-24 LAB — CBC
HCT: 27.9 % — ABNORMAL LOW (ref 36.0–46.0)
Hemoglobin: 9.5 g/dL — ABNORMAL LOW (ref 12.0–15.0)
MCH: 35.2 pg — ABNORMAL HIGH (ref 26.0–34.0)
MCHC: 34.1 g/dL (ref 30.0–36.0)
MCV: 103.3 fL — ABNORMAL HIGH (ref 78.0–100.0)
RBC: 2.7 MIL/uL — ABNORMAL LOW (ref 3.87–5.11)
WBC: 6 10*3/uL (ref 4.0–10.5)

## 2013-03-24 LAB — MITOCHONDRIAL ANTIBODIES: Mitochondrial M2 Ab, IgG: 0.44 (ref <0.91)

## 2013-03-24 MED ORDER — LINACLOTIDE 145 MCG PO CAPS
145.0000 ug | ORAL_CAPSULE | Freq: Every day | ORAL | Status: DC
  Administered 2013-03-24 – 2013-03-25 (×2): 145 ug via ORAL
  Filled 2013-03-24 (×5): qty 1

## 2013-03-24 MED ORDER — ZOLPIDEM TARTRATE 5 MG PO TABS
5.0000 mg | ORAL_TABLET | Freq: Once | ORAL | Status: AC
  Administered 2013-03-24: 5 mg via ORAL
  Filled 2013-03-24: qty 1

## 2013-03-24 MED ORDER — OXYCODONE HCL 5 MG PO TABS
5.0000 mg | ORAL_TABLET | Freq: Four times a day (QID) | ORAL | Status: DC | PRN
  Administered 2013-03-24 – 2013-03-26 (×9): 5 mg via ORAL
  Filled 2013-03-24 (×9): qty 1

## 2013-03-24 MED ORDER — PROMETHAZINE HCL 12.5 MG PO TABS
12.5000 mg | ORAL_TABLET | Freq: Four times a day (QID) | ORAL | Status: DC | PRN
  Administered 2013-03-24 – 2013-03-26 (×8): 12.5 mg via ORAL
  Filled 2013-03-24 (×8): qty 1

## 2013-03-24 NOTE — Plan of Care (Signed)
Problem: Phase III Progression Outcomes Goal: Activity at appropriate level-compared to baseline (UP IN CHAIR FOR HEMODIALYSIS)  Outcome: Adequate for Discharge Refusing to participate with PT

## 2013-03-24 NOTE — Progress Notes (Addendum)
Subjective:  Patient resting/sleeping upon entering the room. Complains of nausea. Upset pain medication converted to oral. Taking promethazine TID. Keeping down oral medications.   Objective: Vital signs in last 24 hours: Temp:  [97.1 F (36.2 C)-98.9 F (37.2 C)] 98.8 F (37.1 C) (12/11 0516) Pulse Rate:  [84-99] 90 (12/11 0911) Resp:  [16-20] 20 (12/11 0516) BP: (93-122)/(59-86) 94/59 mmHg (12/11 0516) SpO2:  [92 %-99 %] 94 % (12/11 0911) Last BM Date: 03/24/13 General:   Alert,  Well-developed, well-nourished, pleasant and cooperative in NAD Head:  Normocephalic and atraumatic. Eyes:  Sclera clear, no icterus.   Abdomen:  Soft, nontender and nondistended.   Normal bowel sounds, without guarding, and without rebound.   Extremities:  Without clubbing, deformity or edema. Neurologic:  Alert and  oriented x4;  grossly normal neurologically. Skin:  Intact without significant lesions or rashes. Psych:  Alert and cooperative. Normal mood and affect.  Intake/Output from previous day: 12/10 0701 - 12/11 0700 In: 900 [P.O.:900] Out: -  Intake/Output this shift: Total I/O In: 230 [P.O.:230] Out: -   Lab Results: CBC  Recent Labs  03/23/13 0546 03/24/13 0618  WBC 7.5 6.0  HGB 5.9* 9.5*  HCT 17.8* 27.9*  MCV 117.9* 103.3*  PLT 88* 85*   BMET  Recent Labs  03/23/13 0546 03/24/13 0618  NA 131* 131*  K 3.6 3.6  CL 91* 92*  CO2 31 30  GLUCOSE 97 79  BUN 7 6  CREATININE 0.50 0.41*  CALCIUM 8.4 8.5   LFTs  Recent Labs  03/22/13 0922 03/23/13 0546 03/24/13 0618  BILITOT 7.8* 7.9* 10.8*  BILIDIR 5.9*  --   --   IBILI 1.9*  --   --   ALKPHOS 202* 168* 169*  AST 90* 76* 75*  ALT 28 24 23   PROT 7.4 6.4 6.8  ALBUMIN 2.4* 2.1* 2.2*   No results found for this basename: LIPASE,  in the last 72 hours PT/INR No results found for this basename: LABPROT, INR,  in the last 72 hours    Imaging Studies: Dg Chest 1 View  03/19/2013   CLINICAL DATA:  Fall 1  week ago  EXAM: CHEST - 1 VIEW  COMPARISON:  11/01/2012  FINDINGS: The heart size and mediastinal contours are within normal limits. Both lungs are clear. The visualized skeletal structures are unremarkable.  IMPRESSION: No active disease.   Electronically Signed   By: Signa Kell M.D.   On: 03/19/2013 17:17   Dg Lumbar Spine Complete  03/19/2013   CLINICAL DATA:  Fall.  EXAM: LUMBAR SPINE - COMPLETE 4+ VIEW  COMPARISON:  None.  FINDINGS: There is no evidence of lumbar spine fracture. Alignment is normal. Disc space narrowing and ventral spurring is noted at L5-S1.  IMPRESSION: 1. No acute findings. 2. L5-S1 degenerative disc disease   Electronically Signed   By: Signa Kell M.D.   On: 03/19/2013 17:16   Dg Hip Complete Right  03/19/2013   CLINICAL DATA:  Fall.  One week ago  EXAM: RIGHT HIP - COMPLETE 2+ VIEW  COMPARISON:  None  FINDINGS: Healed fracture deformity involving the proximal right femur is identified. There is no acute fracture or subluxation identified.  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  IMPRESSION: 1. No acute findings. 2. Healed right proximal femur fracture.   Electronically Signed   By: Signa Kell M.D.   On: 03/19/2013 17:14   Ct Abdomen Pelvis W Contrast  03/19/2013  CLINICAL DATA:  Fall 1 week ago with right-sided pain.  EXAM: CT ABDOMEN AND PELVIS WITH CONTRAST  TECHNIQUE: Multidetector CT imaging of the abdomen and pelvis was performed using the standard protocol following bolus administration of intravenous contrast.  CONTRAST:  OMNIPAQUE IOHEXOL 300 MG/ML  SOLN  COMPARISON:  04/29/2012  FINDINGS: Lung bases are clear.  Negative for free air.  There is diffuse low-attenuation of the liver consistent with hepatic steatosis. Liver is large for size. Scattered areas of fatty sparing throughout the liver. The gallbladder has been removed. Normal appearance of the spleen, adrenal glands, pancreas and both kidneys. There is chronic stranding in the  retroperitoneum and right paracolic gutter. Incidentally, the patient has a retroaortic left renal vein. Stable stranding around the root of the IMA. No significant free fluid or lymphadenopathy. Atherosclerotic calcifications in the aorta and visceral vessels without aneurysmal dilatation. No gross abnormality to the uterus or adnexa tissue. Small amount of fluid in the urinary bladder. No gross abnormality to the appendix. No acute bone abnormality.  IMPRESSION: Hepatomegaly with diffuse low-attenuation of the liver. Findings are suggestive for hepatic steatosis. Cirrhosis cannot be excluded.  There is chronic stranding in the abdomen, particularly in the retroperitoneum. Findings could represent prior inflammation or fibrosis.   Electronically Signed   By: Richarda Overlie M.D.   On: 03/19/2013 17:34   US Abdomen Limited Ruq  03/20/2013   CLINICAL DATA:  Abnormal LFTs  EXAM: US ABDOMEN LIMITED - RIGHT UPPER QUADRANT  COMPARISON:  None.  FINDINGS: Gallbladder  Prior cholecystectomy.  Common bile duct  Diameter: Mildly increased measuring 6.9 mm.  Liver:  Diffusely increased echogenicity.  No focal mass lesion identified.  IMPRESSION: 1. Prior cholecystectomy. 2. Diffuse fatty infiltration of the liver.   Electronically Signed   By: Signa Kell M.D.   On: 03/20/2013 10:01  [2 weeks]   Assessment:  52 year old female with multiple medical comorbidities, admitted with elevated LFTs, nausea, vomiting, found to have severe erosive reflux esophagitis and PUD causing some mild element of partial gastric outlet obstruction but still patent. Likely secondary to NSAID use. Path negative for H.Pylori.  Elevated LFTs: H/O +HCV Ab BUT HCV RNA undetectable. Further work-up for PBC, autoimmune process pending. Ferritin elevated non-specific in this setting of acute illness, ETOH use. Total bilirubin to 10.8 today. Other parameters stable. Her iron level was high twice in the past year. Currently normal. CT cannot exclude  cirrhosis. Spleen size appeared normal. Notable thrombocytopenia.  Acute blood loss anemia: Hgb 5.9 yesterday. Up to 9.5 after 2 units. She has no signs of overt GI bleeding. Question multifactorial at this time with contribution of PUD, ETOH use, acute illness.   Nausea/Vomiting: improved since admission but still with poor intake. Secondary to PUD, but unable to exclude underlying motility disorder.   Constipation: Daily Miralax is being given. Small results with enema yesterday.Consider Linzess or Amitiza for chronic constipation.   Plan: 1. Outpatient colonoscopy. 2. Twice a day PPI. Carafate. 3. Avoid alcohol and NSAIDs. 4. F/u pending labs.  5. Start Linzess.   LOS: 5 days   Tana Coast  03/24/2013, 12:30 PM  Attending note: Agree with above assessment. Gastric biopsies revealed only mild inflammation. No evidence of H. Pylori.  Phenergan switched to the by mouth route secondary to sedation.

## 2013-03-24 NOTE — Progress Notes (Signed)
[facility] PROGRESS NOTE  [name]:[id number] DOB: [birth year] DOA: 03/19/2013 PCP: Brandy Conrad, MD  Assessment/Plan: 1. Nausea, vomiting: Resolving. In the setting of uncontrolled reflux, daily NSAID use and alcohol use. Improved with Phenergan. Possible contributed to by UTI. EGD revealed severe erosive reflux esophagitis, peptic ulcer disease with partial gastric outlet obstruction  2. Severe erosive reflux esophagitis, peptic ulcer disease with partial gastric outlet obstruction: History of ongoing alcohol use and daily NSAID use. Continue PPI twice a day, Carafate. No alcohol or NSAIDS. Consider outpatient hepatology evaluation. 3. Acute blood loss anemia: Improved status post transfusion 12/10. Superimposed on macrocytic anemia: Vitamin B 12 and folate normal. Suspect secondary to subacute blood loss from peptic ulcer disease, NSAID use, alcohol use superimposed on anemia of chronic disease.  4. Hyperbilirubinemia, elevated LFTs: Bilirubin higher today. Likely multifactorial including alcohol-induced hepatitis, possible chronic hepatitis C however viral load was undetectable, hepatic steatosis. CT of the abdomen and pelvis suggested hepatic steatosis. Cirrhosis could not be excluded. Ultrasound confirmed diffuse fatty infiltration of the liver. Consideration given to the possibility of autoimmune disease, iron overload. Further evaluation and followup per gastroenterology. 5. Mechanical fall prior to admission: Imaging negative. No evidence of injury. Physical therapy evaluation noted. Occupational therapy consult pending. 6. UTI: Continue Cipro for Escherichia coli. 7. Thrombocytopenia: Appears to be chronic, intermittent. Likely related to liver disease. Stable. 8. Hypothyroidism: Continue Synthroid. TSH was elevated. T4 was normal. Followup as an outpatient with repeat testing with condition improved. 9. History of GBS: Chronic lower extremity weakness 10. Legally  blind 11. History of asthma 12. Current smoker: Recommend cessation. 13. Ongoing alcohol use: No evidence of withdrawal. Social work consultation appreciated. 14. Severe depression: Social work has suggested outpatient followup. Consider antidepressant. 15. Hypoxia? Resolved. Was likely spurious. No evidence of infection. 16. Constipation: Resolving with bowel regimen.   Transition to oral antiemetics and pain medication. Rationale discussed with patient.  Monitor liver function, further recommendations per GI  Repeat CBC in the morning to assess stability  Eventual transfer to skilled nursing facility when cleared by GI and hemoglobin stable  Pending studies:     Code Status: full code DVT prophylaxis: SCDs Family Communication: none present Disposition Plan: HHPT vs. SNF  Brandy Sacks, MD  [facility]  Pager [phone] If 7PM-7AM, please contact night-coverage at [email or web address], password [facility] 03/24/2013, 12:18 PM  LOS: 5 days   Summary: 52 year old woman with history of hepatitis C, legal blindness, GBS who fell at home. Subsequently developed nausea, vomiting and abdominal pain at home. Dysuria. Poor oral intake. Constipated. Imaging was negative. She was admitted for nausea, vomiting, UTI, abnormal LFTs with hepatomegaly with hyperbilirubinemia. She was seen in consultation with gastroenterology and underwent an EGD which revealed severe erosive reflux esophagitis, peptic ulcer disease causing some element of partial gastric outlet obstruction secondary to NSAID use.  Consultants:  Gastroenterology  Physical therapy: Suspected much of mobility problems are emotionally based. HHPT vs. SNF  Procedures:  12/8 EGD: Severe erosive reflux esophagitis, hiatal hernia, gastric ulcer disease with secondary edema producing some encroachment on the pyloric channel producing an element of partial gastric outlet obstruction. No fixed obstruction. Consent effect  likely.  Transfusion 2 units packed red blood cells 12/10  Antibiotics:  Ceftriaxone 12/6 >> 12/8  Ciprofloxacin 12/9 >> 12/12  HPI/Subjective: Overall feels somewhat better. Still has some nausea. Requesting IV Phenergan and pain medication. No new issues. Receive 2 units packed red blood cells yesterday.  Objective: Filed Vitals:   03/23/13  2235 03/23/13 2335 03/24/13 0516 03/24/13 0911  BP: 93/60 122/78 94/59   Pulse: 92 84 90 90  Temp: 98 F (36.7 C) 97.1 F (36.2 C) 98.8 F (37.1 C)   TempSrc: Oral Oral Oral   Resp: 16 16 20    Height:      Weight:      SpO2: 94% 96% 99% 94%    Intake/Output Summary (Last 24 hours) at 03/24/13 1218 Last data filed at 03/24/13 0804  Gross per 24 hour  Intake    770 ml  Output      0 ml  Net    770 ml     Filed Weights   03/19/13 1528 03/19/13 2031  Weight: 77.111 kg (170 lb) 90.1 kg (198 lb 10.2 oz)    Exam:   Afebrile, vital signs stable. Hypoxia resolved. Likely will spurious.  General: Appears anxious but comfortable, nontoxic. No apparent pain.  Cardiovascular: Regular rate and rhythm. No murmur, rub or gallop.  Respiratory: Clear to auscultation bilaterally. No wheezes, rales or rhonchi. Normal respiratory effort.  Abdomen: Soft, nontender, nondistended.  Data Reviewed:  Basic metabolic panel with mild hyponatremia, stable. AST without change. Total bilirubin has increased, 10.8.  Hemoglobin 9.5 status post transfusion  Scheduled Meds: . ciprofloxacin  250 mg Oral BID  . docusate sodium  100 mg Oral BID  . folic acid  1 mg Oral Daily  . levothyroxine  75 mcg Oral q morning - 10a  . pantoprazole  40 mg Oral BID AC  . polyethylene glycol  17 g Oral Daily  . potassium chloride SA  20 mEq Oral BID  . pregabalin  100 mg Oral TID  . senna  1 tablet Oral Daily  . sucralfate  1 g Oral TID WC & HS  . thiamine  100 mg Oral Daily   Continuous Infusions:   Principal Problem:   PUD (peptic ulcer  disease) Active Problems:   Dehydration   Abnormal LFTs   Hypothyroid   UTI (urinary tract infection)   Nausea and vomiting in adult   Hyperbilirubinemia   Macrocytic anemia   GBS (Guillain Barre syndrome)   Hepatitis C   Acute esophagitis   Acute blood loss anemia   Time spent 20 minutes

## 2013-03-24 NOTE — Clinical Social Work Note (Signed)
CSW gave patient bed offers, patient chose [facility].  Facility informed, will assist patient w Medicaid application if needed on admission.  Santa Genera, LCSW Clinical Social Worker [phone])

## 2013-03-24 NOTE — Progress Notes (Signed)
PT Cancellation Note  Patient Details Name: LENIYAH MARTELL MRN: [id number] DOB: 1960-08-31   Cancelled Treatment:    Reason Eval/Treat Not Completed: Patient declined, no reason specified Pt states that she "feels terrible".  Will not agree to do any PT.  When urged to try, she became irritated and stated that she wanted to visit with her husband.  Konrad Penta 03/24/2013, 3:24 PM

## 2013-03-25 ENCOUNTER — Encounter (HOSPITAL_COMMUNITY): Payer: Self-pay | Admitting: Gastroenterology

## 2013-03-25 LAB — COMPREHENSIVE METABOLIC PANEL
ALT: 19 U/L (ref 0–35)
AST: 71 U/L — ABNORMAL HIGH (ref 0–37)
Albumin: 2 g/dL — ABNORMAL LOW (ref 3.5–5.2)
CO2: 30 mEq/L (ref 19–32)
Calcium: 8.3 mg/dL — ABNORMAL LOW (ref 8.4–10.5)
Chloride: 93 mEq/L — ABNORMAL LOW (ref 96–112)
GFR calc non Af Amer: >90 mL/min (ref >90)
Potassium: 3.3 mEq/L — ABNORMAL LOW (ref 3.5–5.1)
Sodium: 131 mEq/L — ABNORMAL LOW (ref 135–145)
Total Bilirubin: 8.3 mg/dL — ABNORMAL HIGH (ref 0.3–1.2)
Total Protein: 6.3 g/dL (ref 6.0–8.3)

## 2013-03-25 LAB — CBC
MCH: 35.3 pg — ABNORMAL HIGH (ref 26.0–34.0)
MCHC: 34.5 g/dL (ref 30.0–36.0)
MCV: 102.2 fL — ABNORMAL HIGH (ref 78.0–100.0)
Platelets: 84 10*3/uL — ABNORMAL LOW (ref 150–400)
RBC: 2.24 MIL/uL — ABNORMAL LOW (ref 3.87–5.11)

## 2013-03-25 MED ORDER — ZOLPIDEM TARTRATE 5 MG PO TABS
5.0000 mg | ORAL_TABLET | Freq: Every evening | ORAL | Status: DC | PRN
  Administered 2013-03-25: 5 mg via ORAL
  Filled 2013-03-25: qty 1

## 2013-03-25 NOTE — Progress Notes (Signed)
PT Cancellation Note  Patient Details Name: [name] MRN: [id number] DOB: 1961/03/27   Cancelled Treatment:    Reason Eval/Treat Not Completed: Fatigue/lethargy limiting ability to participate Pt once again refuses PT, twice. She states that she doesn't feel well.  We discussed my concern about her ability to manage at home, but she became angry and stated emphatically that she was going home.  She states that she has a good friend who can come and stay with her.  Konrad Penta 03/25/2013, 2:28 PM

## 2013-03-25 NOTE — Clinical Social Work Note (Signed)
CSW met with pt to follow up regarding [facility]. Pt states that she never agreed to go there. CSW asked if she would be interested in another facility. Pt was very emphatic that she was not going to any type of inpatient facility for therapy. CSW asked if she felt she would be able to manage at home and she replied, "Once I'm able to walk, yes." Explained that this was the purpose of SNF so that she could regain mobility before returning home, but she still refuses. Updated MD and CM. Pt is agreeable to home health at this point. CSW spoke with Marylene Land at [facility] to inform her of pt's decision. D/C hopefully tomorrow. If pt changes her mind, [facility] should be able to take her tomorrow. If needed, Marylene Land requests staff to call her cell phone at [phone] and would like d/c summary as early as possible.   Derenda Fennel, [place] [phone]

## 2013-03-25 NOTE — Progress Notes (Signed)
[facility] PROGRESS NOTE  IDONA STACH BJY:[id number] DOB: 18-Oct-1960 DOA: 03/19/2013 PCP: Ernestine Conrad, MD  Assessment/Plan: 1. Nausea, vomiting: Largely resolved. In the setting of uncontrolled reflux, daily NSAID use and alcohol use. Improved with Phenergan. Possible contributed to by UTI. EGD revealed severe erosive reflux esophagitis, peptic ulcer disease with partial gastric outlet obstruction  2. Severe erosive reflux esophagitis, peptic ulcer disease with partial gastric outlet obstruction: History of ongoing alcohol use and daily NSAID use. Continue PPI twice a day, Carafate. No alcohol or NSAIDS. Gastric biopsies revealed only mild inflammation, no evidence of H. pylori. 3. Acute blood loss anemia: Somewhat lower today, however I suspect represents equilibration as  yesterday's value was probably artificially high. Superimposed on macrocytic anemia: Vitamin B 12 and folate normal. Suspect secondary to subacute blood loss from peptic ulcer disease, NSAID use, alcohol use superimposed on anemia of chronic disease.  4. Hyperbilirubinemia, elevated LFTs: Bilirubin somewhat improved. Likely multifactorial including alcohol-induced hepatitis, possible chronic hepatitis C however viral load was undetectable, hepatic steatosis. CT of the abdomen and pelvis suggested hepatic steatosis. Cirrhosis could not be excluded. Ultrasound confirmed diffuse fatty infiltration of the liver. Consideration given to the possibility of autoimmune disease, iron overload, further evaluation per GI.  5. Mechanical fall prior to admission: Imaging negative. No evidence of injury. Physical therapy evaluation noted. UTI: Continue Cipro for Escherichia coli. 6. Thrombocytopenia: Stable. Appears to be chronic, intermittent. Likely related to liver disease.  7. Hypothyroidism: Continue Synthroid. TSH was elevated. T4 was normal. Followup as an outpatient with repeat testing with condition improved. 8. History of GBS:  Chronic lower extremity weakness 9. Legally blind 10. History of asthma 11. Current smoker: Recommend cessation. 12. Ongoing alcohol use: No evidence of withdrawal. Social work consultation appreciated. 13. Severe depression: Social work has suggested outpatient followup. Consider antidepressant as outpatient. 14. Constipation: resolved. Start Linzess.   Linzess per GI  Continue PPI, Carafate  Advance diet  Repeat EGD in 12 weeks, consider colonoscopy at that time  Complete 5 day course of Cipro  Patient refuses skilled nursing facility placement. Anticipate discharge home with home health 12/13 hemoglobin stable.    Code Status: full code DVT prophylaxis: SCDs Family Communication: none present Disposition Plan: HHPT vs. SNF  [name], MD  [facility]  Pager [phone] If 7PM-7AM, please contact night-coverage at [email or web address], password [facility] 03/25/2013, 1:48 PM  LOS: 6 days   Summary: 52 year old woman with history of hepatitis C, legal blindness, GBS who fell at home. Subsequently developed nausea, vomiting and abdominal pain at home. Dysuria. Poor oral intake. Constipated. Imaging was negative. She was admitted for nausea, vomiting, UTI, abnormal LFTs with hepatomegaly with hyperbilirubinemia. She was seen in consultation with gastroenterology and underwent an EGD which revealed severe erosive reflux esophagitis, peptic ulcer disease causing some element of partial gastric outlet obstruction secondary to NSAID use.  Consultants:  Gastroenterology  Physical therapy: Suspected much of mobility problems are emotionally based. HHPT vs. SNF  Procedures:  12/8 EGD: Severe erosive reflux esophagitis, hiatal hernia, gastric ulcer disease with secondary edema producing some encroachment on the pyloric channel producing an element of partial gastric outlet obstruction. No fixed obstruction. Consent effect likely.  Transfusion 2 units packed red blood cells  12/10  Antibiotics:  Ceftriaxone 12/6 >> 12/8  Ciprofloxacin 12/9 >> 12/12  HPI/Subjective: Feels very anxious. Requests Xanax. Refuses skilled nursing facility offer. Some nausea.  Objective: Filed Vitals:   03/24/13 0911 03/24/13 1504 03/24/13 2208 03/25/13 0500  BP:  118/80 114/82 98/67  Pulse: 90 98 98 92  Temp:   98.3 F (36.8 C)   TempSrc:   Other (Comment) Oral  Resp:  17 18 18   Height:      Weight:      SpO2: 94% 95% 95% 96%    Intake/Output Summary (Last 24 hours) at 03/25/13 1348 Last data filed at 03/25/13 0930  Gross per 24 hour  Intake    240 ml  Output      0 ml  Net    240 ml     Filed Weights   03/19/13 1528 03/19/13 2031  Weight: 77.111 kg (170 lb) 90.1 kg (198 lb 10.2 oz)    Exam:   Afebrile, vital signs stable.   General: Appears anxious, comfortable. Nontoxic.  Cardiovascular: Regular rate and rhythm. No murmur, rub or gallop. No lower extremity edema.  Respiratory: Clear to auscultation bilaterally, no wheezes, rales, rhonchi. Normal respiratory effort.  Abdomen: Soft. Nontender.  Data Reviewed:  Potassium 3.3. Basic metabolic panel otherwise unremarkable.  LFTs with modest improvement. Total bilirubin 8.3.  Hemoglobin 7.9 from 9.5. This likely reflects equilibration, 9.5 was probably artificially high.  Scheduled Meds: . ciprofloxacin  250 mg Oral BID  . folic acid  1 mg Oral Daily  . levothyroxine  75 mcg Oral q morning - 10a  . Linaclotide  145 mcg Oral Daily  . pantoprazole  40 mg Oral BID AC  . potassium chloride SA  20 mEq Oral BID  . pregabalin  100 mg Oral TID  . sucralfate  1 g Oral TID WC & HS  . thiamine  100 mg Oral Daily   Continuous Infusions:   Principal Problem:   PUD (peptic ulcer disease) Active Problems:   Dehydration   Abnormal LFTs   Hypothyroid   UTI (urinary tract infection)   Nausea and vomiting in adult   Hyperbilirubinemia   Macrocytic anemia   GBS (Guillain Barre syndrome)   Hepatitis  C   Acute esophagitis   Acute blood loss anemia   Time spent 20 minutes

## 2013-03-25 NOTE — Progress Notes (Addendum)
REVIEWED. AGREE. Now has diarrhea. D/C LINZESS AND CARAFATE.

## 2013-03-25 NOTE — Progress Notes (Signed)
Subjective:  Patient with multiple questions and concerns about her phenergan and pain medications. Who writes her prescriptions and if she is getting an aid for home. States she is still nauseated but is not vomiting. States she had 5-6 stools in the last 24 hours. Denies melena, brbpr.   Objective: Vital signs in last 24 hours: Temp:  [98.3 F (36.8 C)] 98.3 F (36.8 C) (12/11 2208) Pulse Rate:  [92-98] 92 (12/12 0500) Resp:  [17-18] 18 (12/12 0500) BP: (98-118)/(67-82) 98/67 mmHg (12/12 0500) SpO2:  [95 %-96 %] 96 % (12/12 0500) Last BM Date: 03/24/13 General:   Alert,  Chronically ill appearing. Jaundiced. Head:  Normocephalic and atraumatic. Eyes:  Sclera clear, + icterus.  Chest: CTA bilaterally without rales, rhonchi, crackles.    Heart:  Regular rate and rhythm; no murmurs, clicks, rubs,  or gallops. Abdomen:  Soft, nontender and nondistended. No masses, hepatosplenomegaly or hernias noted. Normal bowel sounds, without guarding, and without rebound.   Extremities:  Without clubbing, deformity or edema. Neurologic:  Alert and  oriented x4;  grossly normal neurologically. Skin:  Intact without significant lesions or rashes. Psych:  Alert and cooperative. Normal mood and affect.  Intake/Output from previous day: 12/11 0701 - 12/12 0700 In: 590 [P.O.:590] Out: -  Intake/Output this shift:    Lab Results: CBC  Recent Labs  03/23/13 0546 03/24/13 0618 03/25/13 0547  WBC 7.5 6.0 5.7  HGB 5.9* 9.5* 7.9*  HCT 17.8* 27.9* 22.9*  MCV 117.9* 103.3* 102.2*  PLT 88* 85* 84*   BMET  Recent Labs  03/23/13 0546 03/24/13 0618 03/25/13 0547  NA 131* 131* 131*  K 3.6 3.6 3.3*  CL 91* 92* 93*  CO2 31 30 30   GLUCOSE 97 79 89  BUN 7 6 6   CREATININE 0.50 0.41* 0.45*  CALCIUM 8.4 8.5 8.3*   LFTs  Recent Labs  03/23/13 0546 03/24/13 0618 03/25/13 0547  BILITOT 7.9* 10.8* 8.3*  ALKPHOS 168* 169* 158*  AST 76* 75* 71*  ALT 24 23 19   PROT 6.4 6.8 6.3  ALBUMIN 2.1*  2.2* 2.0*   Anti-smooth muscle antibody, IgG 21 (weak positive range) normal <20. IgG and IgA elevated.  AMA negative. ANA negative.  No results found for this basename: LIPASE,  in the last 72 hours PT/INR No results found for this basename: LABPROT, INR,  in the last 72 hours    Imaging Studies: Ct Abdomen Pelvis W Contrast  03/19/2013   CLINICAL DATA:  Fall 1 week ago with right-sided pain.  EXAM: CT ABDOMEN AND PELVIS WITH CONTRAST  TECHNIQUE: Multidetector CT imaging of the abdomen and pelvis was performed using the standard protocol following bolus administration of intravenous contrast.  CONTRAST:  OMNIPAQUE IOHEXOL 300 MG/ML  SOLN  COMPARISON:  04/29/2012  FINDINGS: Lung bases are clear.  Negative for free air.  There is diffuse low-attenuation of the liver consistent with hepatic steatosis. Liver is large for size. Scattered areas of fatty sparing throughout the liver. The gallbladder has been removed. Normal appearance of the spleen, adrenal glands, pancreas and both kidneys. There is chronic stranding in the retroperitoneum and right paracolic gutter. Incidentally, the patient has a retroaortic left renal vein. Stable stranding around the root of the IMA. No significant free fluid or lymphadenopathy. Atherosclerotic calcifications in the aorta and visceral vessels without aneurysmal dilatation. No gross abnormality to the uterus or adnexa tissue. Small amount of fluid in the urinary bladder. No gross abnormality to the appendix. No acute  bone abnormality.  IMPRESSION: Hepatomegaly with diffuse low-attenuation of the liver. Findings are suggestive for hepatic steatosis. Cirrhosis cannot be excluded.  There is chronic stranding in the abdomen, particularly in the retroperitoneum. Findings could represent prior inflammation or fibrosis.   Electronically Signed   By: Richarda Overlie M.D.   On: 03/19/2013 17:34   US Abdomen Limited Ruq  03/20/2013   CLINICAL DATA:  Abnormal LFTs  EXAM: US  ABDOMEN LIMITED - RIGHT UPPER QUADRANT  COMPARISON:  None.  FINDINGS: Gallbladder  Prior cholecystectomy.  Common bile duct  Diameter: Mildly increased measuring 6.9 mm.  Liver:  Diffusely increased echogenicity.  No focal mass lesion identified.  IMPRESSION: 1. Prior cholecystectomy. 2. Diffuse fatty infiltration of the liver.   Electronically Signed   By: Signa Kell M.D.   On: 03/20/2013 10:01  [2 weeks]   Assessment: 52 year old female with multiple medical comorbidities, admitted with elevated LFTs, nausea, vomiting, found to have severe erosive reflux esophagitis and PUD causing some mild element of partial gastric outlet obstruction but still patent. Likely secondary to NSAID use. Path negative for H.Pylori.   Elevated LFTs: H/O +HCV Ab BUT HCV RNA undetectable. Work-up for PBC, autoimmune process unremarkable. Her IgG/IgA were elevated and anti-smooth muscle Ab weakly positive, unclear significant at this time given acute illness/etoh use. Ferritin elevated non-specific in this setting of acute illness, ETOH use. Total bilirubin slightly better today. Other parameters stable. Her iron level was high twice in the past year. Currently normal. CT cannot exclude cirrhosis. Spleen size appeared normal. Notable thrombocytopenia.   Acute blood loss anemia: Hgb 5.9 yesterday. Up to 9.5 after 2 units. 8.9 today without signs of overt GI bleeding.    Nausea/Vomiting: improved since admission but still with poor intake. Secondary to PUD, but unable to exclude underlying motility disorder.   Constipation: Daily Miralax is being given. Small results with enema yesterday. Multiple stools yesterday.  Received first dose of linzess last night.   Plan: 1. Continue Linzess but D/C other laxatives, etc. 2. Advance diet as tolerated. 3. Repeat EGD in 12 weeks, consider colonoscopy at that time. 4. PPI BID. Carafate for 2 weeks. 5. Complete five day course of Cipro. 6. HFE testing to clarify regarding  elevated iron.   LOS: 6 days   Tana Coast  03/25/2013, 11:40 AM

## 2013-03-26 DIAGNOSIS — K922 Gastrointestinal hemorrhage, unspecified: Secondary | ICD-10-CM

## 2013-03-26 DIAGNOSIS — F1010 Alcohol abuse, uncomplicated: Secondary | ICD-10-CM

## 2013-03-26 LAB — CBC
Hemoglobin: 8.2 g/dL — ABNORMAL LOW (ref 12.0–15.0)
MCH: 35.7 pg — ABNORMAL HIGH (ref 26.0–34.0)
MCV: 102.2 fL — ABNORMAL HIGH (ref 78.0–100.0)
Platelets: 91 10*3/uL — ABNORMAL LOW (ref 150–400)
RBC: 2.3 MIL/uL — ABNORMAL LOW (ref 3.87–5.11)
WBC: 7.7 10*3/uL (ref 4.0–10.5)

## 2013-03-26 MED ORDER — OXYCODONE HCL 5 MG PO TABS: 5.0000 mg | ORAL_TABLET | Freq: Four times a day (QID) | ORAL | Status: DC | PRN

## 2013-03-26 MED ORDER — ONDANSETRON HCL 4 MG PO TABS: 4.0000 mg | ORAL_TABLET | Freq: Four times a day (QID) | ORAL | Status: DC | PRN

## 2013-03-26 MED ORDER — FOLIC ACID 1 MG PO TABS: 1.0000 mg | ORAL_TABLET | Freq: Every day | ORAL | Status: DC

## 2013-03-26 MED ORDER — PANTOPRAZOLE SODIUM 40 MG PO TBEC: 40.0000 mg | DELAYED_RELEASE_TABLET | Freq: Two times a day (BID) | ORAL | Status: AC

## 2013-03-26 NOTE — Progress Notes (Addendum)
[facility] PROGRESS NOTE  [name] XBJ:[id number] DOB: 04-22-1960 DOA: 03/19/2013 PCP: Brandy Conrad, MD  Assessment/Plan: 1. Nausea, vomiting: Largely resolved. In the setting of uncontrolled reflux, daily NSAID use and alcohol use. Improved with Phenergan. EGD revealed severe erosive reflux esophagitis, peptic ulcer disease with partial gastric outlet obstruction. 2. Severe erosive reflux esophagitis, peptic ulcer disease with partial gastric outlet obstruction: Much improved. History of ongoing alcohol use and daily NSAID use. Continue PPI twice a day. No alcohol or NSAIDS. Gastric biopsies revealed only mild inflammation, no evidence of H. pylori. 3. Acute blood loss anemia: Stable status post transfusion. Superimposed on macrocytic anemia: Vitamin B 12 and folate normal. Suspect secondary to subacute blood loss from peptic ulcer disease, NSAID use, alcohol use superimposed on anemia of chronic disease.  4. Hyperbilirubinemia, elevated LFTs: Bilirubin somewhat improved. Likely multifactorial including alcohol-induced hepatitis, possible chronic hepatitis C however viral load was undetectable, hepatic steatosis. CT of the abdomen and pelvis suggested hepatic steatosis. Cirrhosis could not be excluded. Ultrasound confirmed diffuse fatty infiltration of the liver. Consideration given to the possibility of autoimmune disease, iron overload, further evaluation per GI.  5. Mechanical fall prior to admission: Imaging negative. No evidence of injury. Physical therapy evaluation noted.  6. UTI: Completed Cipro for Escherichia coli. 7. Thrombocytopenia: Stable. Appears to be chronic, intermittent. Likely related to liver disease.  8. Hypothyroidism: Continue Synthroid. TSH was elevated. T4 was normal. Followup as an outpatient with repeat testing with condition improved. 9. History of GBS: Chronic lower extremity weakness 10. Legally blind 11. History of asthma 12. Current smoker: Recommend  cessation. 13. Ongoing alcohol use: No evidence of withdrawal. Social work consultation appreciated. 14. Severe depression: Social work has suggested outpatient followup. Consider antidepressant as outpatient. 15. Constipation: resolved.    Continue PPI. Discussed with Dr. Darrick Penna, no further inpatient care suggested.  Repeat EGD in 12 weeks, consider colonoscopy at that time  Patient refuses skilled nursing facility placement.   Discharge home today with home health.    Code Status: full code DVT prophylaxis: SCDs Family Communication: none present Disposition Plan: HHPT vs. SNF  Brandy Sacks, MD  [facility]  Pager [phone] If 7PM-7AM, please contact night-coverage at [email or web address], password [facility] 03/26/2013, 12:02 PM  LOS: 7 days   Summary: 52 year old woman with history of hepatitis C, legal blindness, GBS who fell at home. Subsequently developed nausea, vomiting and abdominal pain at home. Dysuria. Poor oral intake. Constipated. Imaging was negative. She was admitted for nausea, vomiting, UTI, abnormal LFTs with hepatomegaly with hyperbilirubinemia. She was seen in consultation with gastroenterology and underwent an EGD which revealed severe erosive reflux esophagitis, peptic ulcer disease causing some element of partial gastric outlet obstruction secondary to NSAID use.  Consultants:  Gastroenterology  Physical therapy: Suspected much of mobility problems are emotionally based. HHPT vs. SNF  Procedures:  12/8 EGD: Severe erosive reflux esophagitis, hiatal hernia, gastric ulcer disease with secondary edema producing some encroachment on the pyloric channel producing an element of partial gastric outlet obstruction. No fixed obstruction. Consent effect likely.  Transfusion 2 units packed red blood cells 12/10  Antibiotics:  Ceftriaxone 12/6 >> 12/8  Ciprofloxacin 12/9 >> 12/12  HPI/Subjective: Complains of nausea, anxiety. Asks again about Xanax, Valium,  pain medication.  Objective: Filed Vitals:   03/25/13 0500 03/25/13 1421 03/25/13 2020 03/26/13 0606  BP: 98/67 108/73 110/60 108/70  Pulse: 92 97 95 96  Temp:  99.4 F (37.4 C) 99.2 F (37.3 C) 99 F (37.2 C)  TempSrc: Oral Oral Oral Oral  Resp: 18 18 18 18   Height:      Weight:      SpO2: 96% 95% 95% 95%    Intake/Output Summary (Last 24 hours) at 03/26/13 1202 Last data filed at 03/25/13 2355  Gross per 24 hour  Intake    320 ml  Output      0 ml  Net    320 ml     Filed Weights   03/19/13 1528 03/19/13 2031  Weight: 77.111 kg (170 lb) 90.1 kg (198 lb 10.2 oz)    Exam:   Afebrile, vital signs stable.   General: Appears much better today. Calm and comfortable. Currently eating lunch including grilled chicken.  Cardiovascular: Regular rate and rhythm. No murmur, rub or gallop. No lower extremity edema  Respiratory: Clear to auscultation bilaterally. No wheezes, rales or rhonchi. Normal respiratory effort.  Abdomen soft, nontender, nondistended  Data Reviewed:  Hemoglobin stable, 7.9 >> 8.2.   Scheduled Meds: . ciprofloxacin  250 mg Oral BID  . folic acid  1 mg Oral Daily  . levothyroxine  75 mcg Oral q morning - 10a  . pantoprazole  40 mg Oral BID AC  . potassium chloride SA  20 mEq Oral BID  . pregabalin  100 mg Oral TID  . thiamine  100 mg Oral Daily   Continuous Infusions:   Principal Problem:   PUD (peptic ulcer disease) Active Problems:   Dehydration   Abnormal LFTs   Hypothyroid   UTI (urinary tract infection)   Nausea and vomiting in adult   Hyperbilirubinemia   Macrocytic anemia   GBS (Guillain Barre syndrome)   Hepatitis C   Acute esophagitis   Acute blood loss anemia

## 2013-03-26 NOTE — Progress Notes (Addendum)
Subjective: Since I last evaluated the patient NO BM SINCE STARTING MEDS. ABD  PAIN IN LOWER ABD FEELS LIKE HER CYCLE IS GOING TO START. UPPER ABD PAIN AS WELL.   Objective: Vital signs in last 24 hours: Temp:  [99 F (37.2 C)-99.4 F (37.4 C)] 99 F (37.2 C) (12/13 0606) Pulse Rate:  [95-97] 96 (12/13 0606) Resp:  [18] 18 (12/13 0606) BP: (108-110)/(60-73) 108/70 mmHg (12/13 0606) SpO2:  [95 %] 95 % (12/13 0606) Last BM Date: 03/25/13  Intake/Output from previous day: 12/12 0701 - 12/13 0700 In: 560 [P.O.:560] Out: -  Intake/Output this shift:    General appearance: alert, cooperative and no distress Resp: clear to auscultation bilaterally Cardio: regular rate and rhythm GI: soft, non-tender; bowel sounds normal; no masses,  no organomegaly  Lab Results:  Recent Labs  03/24/13 0618 03/25/13 0547 03/26/13 0829  WBC 6.0 5.7 7.7  HGB 9.5* 7.9* 8.2*  HCT 27.9* 22.9* 23.5*  PLT 85* 84* 91*   BMET  Recent Labs  03/24/13 0618 03/25/13 0547  NA 131* 131*  K 3.6 3.3*  CL 92* 93*  CO2 30 30  GLUCOSE 79 89  BUN 6 6  CREATININE 0.41* 0.45*  CALCIUM 8.5 8.3*   LFT  Recent Labs  03/25/13 0547  PROT 6.3  ALBUMIN 2.0*  AST 71*  ALT 19  ALKPHOS 158*  BILITOT 8.3*   PT/INR No results found for this basename: LABPROT, INR,  in the last 72 hours Hepatitis Panel No results found for this basename: HEPBSAG, HCVAB, HEPAIGM, HEPBIGM,  in the last 72 hours C-Diff No results found for this basename: CDIFFTOX,  in the last 72 hours Fecal Lactopherrin No results found for this basename: FECLLACTOFRN,  in the last 72 hours  Studies/Results: No results found.  Medications: I have reviewed the patient's current medications.  Assessment/Plan: ADMITTED WITH UGI BLEED. NO S/SX OF GI BLEED.  PLAN: 1. D/C ON BID PPI FOR 3 MOS THEN ONCE DAILY. TAKE 30 MINS PRIOR TO MEALS. 2. STRICTLY AVOID ASPIRIN, BC/GOODY POWDERS, IBUPROFEN/MOTRIN, OR NAPROXEN/ALEVE BECAUSE OF A  HISTORY OF ULCERS. 3.  OPV W/ DR. Jena Gauss IN 4-6 WEEK. NEEDS REPEAT EGD/ INITIAL TCS IN 2-3 MOS. 4. MIRALAX 1-2 TIMES A DAY FOR CONSTIPATION. DULCOLAX PRN TO HAVE A BM 2-3X/WEEK.   LOS: 7 days   [facility] 03/26/2013, 11:15 AM

## 2013-03-26 NOTE — Discharge Summary (Signed)
Physician Discharge Summary  [name] JYN:[id number] DOB: 1960-05-05 DOA: 03/19/2013  PCP: Ernestine Conrad, MD  Admit date: 03/19/2013 Discharge date: 03/26/2013  Recommendations for Outpatient Follow-up:  1. Followup severe erosive reflux esophagitis, peptic ulcer disease with partial gastric outlet obstruction 2. Acute blood loss anemia, consider repeat CBC in followup 3. Cirrhosis suspected, hyperbilirubinemia, multifactorial see below 4. Continue to encourage complete abstinence from alcohol and smoking  5. Repeat EGD in 12 weeks 6. Consider treatment for depression  Follow-up Information   Schedule an appointment as soon as possible for a visit with [facility]. (As needed)    Specialty:  Behavioral Health   Contact information:   [address] [place] [address] [phone]      Follow up with Ernestine Conrad, MD. Schedule an appointment as soon as possible for a visit in 5 days.   Specialty:  Family Medicine   Contact information:   [facility] [address] [place] [address] [phone]       Follow up with Jonette Eva, MD. Schedule an appointment as soon as possible for a visit in 1 month.   Specialty:  Gastroenterology   Contact information:   [address] [place] [address] [phone]      Discharge Diagnoses:  1. Severe erosive reflux esophagitis 2. Peptic ulcer disease 3. Partial gastric outlet obstruction 4. Acute blood loss anemia 5. Hyperbilirubinemia, alcohol induced hepatitis 6. Possible hepatitis C 7. Hepatic steatosis, consider cirrhosis 8. UTI 9. Thrombocytopenia 10. Cigarette smoker 11. Alcohol abuse 12. Depression  Discharge Condition: Improved Disposition: Home with home health RN, PT, nurse's aide, social work. Skilled nursing facility was recommended the patient refused.  Diet recommendation: Low-salt  Filed Weights   03/19/13 1528  03/19/13 2031  Weight: 77.111 kg (170 lb) 90.1 kg (198 lb 10.2 oz)    History of present illness:  52 year old woman with history of hepatitis C, legal blindness, GBS who fell at home. Subsequently developed nausea, vomiting and abdominal pain at home. Dysuria. Poor oral intake. Constipated. Imaging was negative. She was admitted for nausea, vomiting, UTI, abnormal LFTs with hepatomegaly with hyperbilirubinemia.   Hospital Course:  [name] was seen in consultation with gastroenterology and underwent an EGD which revealed severe erosive reflux esophagitis, peptic ulcer disease causing some element of partial gastric outlet obstruction secondary to NSAID use. She was noted to have markedly elevated total bilirubin thought to be secondary to alcohol induced hepatitis as well as hepatic steatosis. Acute on chronic anemia was noted which responded to blood transfusion and has stabilized. She has completed inpatient GI evaluation and will followup as an outpatient. She was offered skilled nursing facility however refused. See individual issues below.  1. Nausea, vomiting: Largely resolved. In the setting of uncontrolled reflux, daily NSAID use and alcohol use. Improved with Phenergan. EGD revealed severe erosive reflux esophagitis, peptic ulcer disease with partial gastric outlet obstruction. 2. Severe erosive reflux esophagitis, peptic ulcer disease with partial gastric outlet obstruction: Much improved. History of ongoing alcohol use and daily NSAID use. Continue PPI twice a day. No alcohol or NSAIDS. Gastric biopsies revealed only mild inflammation, no evidence of H. pylori. 3. Acute blood loss anemia: Stable status post transfusion. Superimposed on macrocytic anemia: Vitamin B 12 and folate normal. Suspect secondary to subacute blood loss from peptic ulcer disease, NSAID use, alcohol use superimposed on anemia of chronic disease.  4. Hyperbilirubinemia, elevated LFTs: Bilirubin somewhat  improved. Likely  multifactorial including alcohol-induced hepatitis, possible chronic hepatitis C however viral load was undetectable, hepatic steatosis. CT of the abdomen and pelvis suggested hepatic steatosis. Cirrhosis could not be excluded. Ultrasound confirmed diffuse fatty infiltration of the liver. Consideration given to the possibility of autoimmune disease, iron overload, further evaluation per GI.  5. Mechanical fall prior to admission: Imaging negative. No evidence of injury. Physical therapy evaluation noted.  6. UTI: Completed Cipro for Escherichia coli. 7. Thrombocytopenia: Stable. Appears to be chronic, intermittent. Likely related to liver disease.  8. Hypothyroidism: Continue Synthroid. TSH was elevated. T4 was normal. Followup as an outpatient with repeat testing with condition improved. 9. History of GBS: Chronic lower extremity weakness 10. Legally blind 11. History of asthma 12. Current smoker: Recommend cessation. 13. Ongoing alcohol use: No evidence of withdrawal. Social work consultation appreciated. 14. Severe depression: Social work has suggested outpatient followup. Consider antidepressant as outpatient. 15. Constipation: resolved.  Continue PPI.   Repeat EGD in 12 weeks, consider colonoscopy at that time   Consultants:  Gastroenterology  Physical therapy: Suspected much of mobility problems are emotionally based. HHPT vs. SNF Procedures:  12/8 EGD: Severe erosive reflux esophagitis, hiatal hernia, gastric ulcer disease with secondary edema producing some encroachment on the pyloric channel producing an element of partial gastric outlet obstruction. No fixed obstruction. Consent effect likely.  Transfusion 2 units packed red blood cells 12/10 Antibiotics:  Ceftriaxone 12/6 >> 12/8  Ciprofloxacin 12/9 >> 12/12  Discharge Instructions  Discharge Orders   Future Orders Complete By Expires   Diet - low sodium heart healthy  As directed    Discharge instructions  As directed     Comments:     Call your physician or seek immediate medical attention for abdominal pain, vomiting or worsening of condition. Absolutely no alcohol, smoking or NSAIDs (including ibuprofen, Motrin, Advil, Aleve, aspirin, BC powders, Goody powders). Do not combine alcohol and pain medication as this can cause severe disability or death.   Increase activity slowly  As directed    Walk with assistance  As directed        Medication List    STOP taking these medications       diclofenac sodium 1 % Gel  Commonly known as:  VOLTAREN      TAKE these medications       cyanocobalamin 1000 MCG/ML injection  Commonly known as:  (VITAMIN B-12)  Inject 1,000 mcg into the muscle once a week. Sunday     folic acid 1 MG tablet  Commonly known as:  FOLVITE  Take 1 tablet (1 mg total) by mouth daily.     levothyroxine 75 MCG tablet  Commonly known as:  SYNTHROID, LEVOTHROID  Take 1 tablet (75 mcg total) by mouth every morning.     ondansetron 4 MG tablet  Commonly known as:  ZOFRAN  Take 1 tablet (4 mg total) by mouth every 6 (six) hours as needed for nausea.     oxyCODONE 5 MG immediate release tablet  Commonly known as:  Oxy IR/ROXICODONE  Take 1 tablet (5 mg total) by mouth every 6 (six) hours as needed for severe pain.     pantoprazole 40 MG tablet  Commonly known as:  PROTONIX  Take 1 tablet (40 mg total) by mouth 2 (two) times daily before a meal.     potassium chloride SA 20 MEQ tablet  Commonly known as:  K-DUR,KLOR-CON  Take 1 tablet (20 mEq total) by mouth 2 (two)  times daily.     pregabalin 100 MG capsule  Commonly known as:  LYRICA  Take 100 mg by mouth 3 (three) times daily.     thiamine 100 MG tablet  Take 1 tablet (100 mg total) by mouth daily.       Allergies  Allergen Reactions  . Neurontin [Gabapentin] Anaphylaxis  . Acetaminophen Itching and Nausea And Vomiting  . Eggs Or Egg-Derived Products Nausea And Vomiting  . Codeine Itching and Nausea And Vomiting     Patient is able to take morphine, demerol and fentanyl with out any type of side effects   The results of significant diagnostics from this hospitalization (including imaging, microbiology, ancillary and laboratory) are listed below for reference.    Significant Diagnostic Studies: Dg Chest 1 View  03/19/2013   CLINICAL DATA:  Fall 1 week ago  EXAM: CHEST - 1 VIEW  COMPARISON:  11/01/2012  FINDINGS: The heart size and mediastinal contours are within normal limits. Both lungs are clear. The visualized skeletal structures are unremarkable.   >> .   Electronically Signed   By: Signa Kell M.D.   On: 03/19/2013 17:17   Dg Lumbar Spine Complete  03/19/2013   CLINICAL DATA:  Fall.  EXAM: LUMBAR SPINE - COMPLETE 4+ VIEW  COMPARISON:  None.  FINDINGS: There is no evidence of lumbar spine fracture. Alignment is normal. Disc space narrowing and ventral spurring is noted at L5-S1.  IMPRESSION: 1. No acute findings. 2. L5-S1 degenerative disc disease   Electronically Signed   By: Signa Kell M.D.   On: 03/19/2013 17:16   Dg Hip Complete Right  03/19/2013   CLINICAL DATA:  Fall.  One week ago  EXAM: RIGHT HIP - COMPLETE 2+ VIEW  COMPARISON:  None  FINDINGS: Healed fracture deformity involving the proximal right femur is identified. There is no acute fracture or subluxation identified.  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  IMPRESSION: 1. No acute findings. 2. Healed right proximal femur fracture.   Electronically Signed   By: Signa Kell M.D.   On: 03/19/2013 17:14   Ct Abdomen Pelvis W Contrast  03/19/2013   CLINICAL DATA:  Fall 1 week ago with right-sided pain.  EXAM: CT ABDOMEN AND PELVIS WITH CONTRAST  TECHNIQUE: Multidetector CT imaging of the abdomen and pelvis was performed using the standard protocol following bolus administration of intravenous contrast.  CONTRAST:  OMNIPAQUE IOHEXOL 300 MG/ML  SOLN  COMPARISON:  04/29/2012  FINDINGS: Lung bases are clear.  Negative for free  air.  There is diffuse low-attenuation of the liver consistent with hepatic steatosis. Liver is large for size. Scattered areas of fatty sparing throughout the liver. The gallbladder has been removed. Normal appearance of the spleen, adrenal glands, pancreas and both kidneys. There is chronic stranding in the retroperitoneum and right paracolic gutter. Incidentally, the patient has a retroaortic left renal vein. Stable stranding around the root of the IMA. No significant free fluid or lymphadenopathy. Atherosclerotic calcifications in the aorta and visceral vessels without aneurysmal dilatation. No gross abnormality to the uterus or adnexa tissue. Small amount of fluid in the urinary bladder. No gross abnormality to the appendix. No acute bone abnormality.  IMPRESSION: Hepatomegaly with diffuse low-attenuation of the liver. Findings are suggestive for hepatic steatosis. Cirrhosis cannot be excluded.  There is chronic stranding in the abdomen, particularly in the retroperitoneum. Findings could represent prior inflammation or fibrosis.   Electronically Signed   By: Meriel Pica.D.  On: 03/19/2013 17:34   US Abdomen Limited Ruq  03/20/2013   CLINICAL DATA:  Abnormal LFTs  EXAM: US ABDOMEN LIMITED - RIGHT UPPER QUADRANT  COMPARISON:  None.  FINDINGS: Gallbladder  Prior cholecystectomy.  Common bile duct  Diameter: Mildly increased measuring 6.9 mm.  Liver:  Diffusely increased echogenicity.  No focal mass lesion identified.  IMPRESSION: 1. Prior cholecystectomy. 2. Diffuse fatty infiltration of the liver.   Electronically Signed   By: Signa Kell M.D.   On: 03/20/2013 10:01    Microbiology: Recent Results (from the past 240 hour(s))  URINE CULTURE     Status: None   Collection Time    03/19/13  3:56 PM      Result Value Range Status   Specimen Description URINE, CATHETERIZED   Final   Special Requests NONE   Final   Culture  Setup Time     Final   Value: 03/19/2013 20:36     Performed at [facility] Count     Final   Value: >=100,000 COLONIES/ML     Performed at [facility]   Culture     Final   Value: ESCHERICHIA COLI     Performed at [facility]   Report Status 03/22/2013 FINAL   Final   Organism ID, Bacteria ESCHERICHIA COLI   Final     Labs: Basic Metabolic Panel:  Recent Labs Lab 03/20/13 0530 03/21/13 0548 03/23/13 0546 03/24/13 0618 03/25/13 0547  NA 136 137 131* 131* 131*  K 4.4 3.7 3.6 3.6 3.3*  CL 93* 95* 91* 92* 93*  CO2 33* 32 31 30 30   GLUCOSE 95 77 97 79 89  BUN 11 10 7 6 6   CREATININE 0.57 0.50 0.50 0.41* 0.45*  CALCIUM 8.3* 8.4 8.4 8.5 8.3*   Liver Function Tests:  Recent Labs Lab 03/21/13 0548 03/22/13 0922 03/23/13 0546 03/24/13 0618 03/25/13 0547  AST 87* 90* 76* 75* 71*  ALT 27 28 24 23 19   ALKPHOS 211* 202* 168* 169* 158*  BILITOT 6.5* 7.8* 7.9* 10.8* 8.3*  PROT 6.9 7.4 6.4 6.8 6.3  ALBUMIN 2.3* 2.4* 2.1* 2.2* 2.0*    Recent Labs Lab 03/19/13 1601  LIPASE 8*    Recent Labs Lab 03/19/13 2042  AMMONIA 46   CBC:  Recent Labs Lab 03/19/13 1601  03/21/13 0548 03/23/13 0546 03/24/13 0618 03/25/13 0547 03/26/13 0829  WBC 12.2*  < > 6.8 7.5 6.0 5.7 7.7  NEUTROABS 9.8*  --   --   --   --   --   --   HGB 8.7*  < > 7.2* 5.9* 9.5* 7.9* 8.2*  HCT 25.1*  < > 21.8* 17.8* 27.9* 22.9* 23.5*  MCV 115.1*  < > 117.8* 117.9* 103.3* 102.2* 102.2*  PLT 112*  < > 88* 88* 85* 84* 91*  < > = values in this interval not displayed.  Principal Problem:   PUD (peptic ulcer disease) Active Problems:   Dehydration   Abnormal LFTs   Hypothyroid   UTI (urinary tract infection)   Nausea and vomiting in adult   Hyperbilirubinemia   Macrocytic anemia   GBS (Guillain Barre syndrome)   Hepatitis C   Acute esophagitis   Acute blood loss anemia   Time coordinating discharge: 40 minutes  Signed:  [name], MD [facility] 03/26/2013, 12:51 PM

## 2013-03-27 ENCOUNTER — Encounter: Payer: Self-pay | Admitting: Internal Medicine

## 2013-03-28 ENCOUNTER — Telehealth: Payer: Self-pay

## 2013-03-28 NOTE — Telephone Encounter (Signed)
Letter from: Corbin Ade  Reason for Letter: Results Review Send letter to patient.  Send copy of letter with path to referring provider and PCP.   Needs ov in 3 months if not already scheduled

## 2013-03-28 NOTE — Telephone Encounter (Signed)
Letter mailed to pt. [name], please nic ov 

## 2013-03-28 NOTE — Telephone Encounter (Signed)
Cc PCP 

## 2013-03-29 ENCOUNTER — Encounter: Payer: Self-pay | Admitting: Internal Medicine

## 2013-03-29 NOTE — Telephone Encounter (Signed)
Pt is aware of OV on 06/27/13 at 10am with [facility] and appt card was mailed

## 2013-04-22 ENCOUNTER — Encounter (HOSPITAL_COMMUNITY): Payer: Self-pay | Admitting: Emergency Medicine

## 2013-04-22 ENCOUNTER — Emergency Department (HOSPITAL_COMMUNITY): Payer: Medicare Other

## 2013-04-22 ENCOUNTER — Inpatient Hospital Stay (HOSPITAL_COMMUNITY)
Admission: EM | Admit: 2013-04-22 | Discharge: 2013-04-24 | DRG: 640 | Disposition: A | Discharge status: Home / Self Care | Payer: Medicare Other | Attending: Internal Medicine | Admitting: Internal Medicine

## 2013-04-22 DIAGNOSIS — E872 Acidosis, unspecified: Secondary | ICD-10-CM

## 2013-04-22 DIAGNOSIS — G8929 Other chronic pain: Secondary | ICD-10-CM

## 2013-04-22 DIAGNOSIS — E876 Hypokalemia: Secondary | ICD-10-CM

## 2013-04-22 DIAGNOSIS — Z6841 Body Mass Index (BMI) 40.0 and over, adult: Secondary | ICD-10-CM

## 2013-04-22 DIAGNOSIS — M5137 Other intervertebral disc degeneration, lumbosacral region: Secondary | ICD-10-CM | POA: Diagnosis present

## 2013-04-22 DIAGNOSIS — R609 Edema, unspecified: Secondary | ICD-10-CM

## 2013-04-22 DIAGNOSIS — R809 Proteinuria, unspecified: Secondary | ICD-10-CM

## 2013-04-22 DIAGNOSIS — E871 Hypo-osmolality and hyponatremia: Principal | ICD-10-CM | POA: Diagnosis present

## 2013-04-22 DIAGNOSIS — H543 Unqualified visual loss, both eyes: Secondary | ICD-10-CM | POA: Diagnosis present

## 2013-04-22 DIAGNOSIS — G610 Guillain-Barre syndrome: Secondary | ICD-10-CM

## 2013-04-22 DIAGNOSIS — F1010 Alcohol abuse, uncomplicated: Secondary | ICD-10-CM

## 2013-04-22 DIAGNOSIS — D7589 Other specified diseases of blood and blood-forming organs: Secondary | ICD-10-CM

## 2013-04-22 DIAGNOSIS — R739 Hyperglycemia, unspecified: Secondary | ICD-10-CM

## 2013-04-22 DIAGNOSIS — T502X5A Adverse effect of carbonic-anhydrase inhibitors, benzothiadiazides and other diuretics, initial encounter: Secondary | ICD-10-CM | POA: Diagnosis present

## 2013-04-22 DIAGNOSIS — I519 Heart disease, unspecified: Secondary | ICD-10-CM

## 2013-04-22 DIAGNOSIS — M129 Arthropathy, unspecified: Secondary | ICD-10-CM | POA: Diagnosis present

## 2013-04-22 DIAGNOSIS — F329 Major depressive disorder, single episode, unspecified: Secondary | ICD-10-CM | POA: Diagnosis present

## 2013-04-22 DIAGNOSIS — G589 Mononeuropathy, unspecified: Secondary | ICD-10-CM | POA: Diagnosis present

## 2013-04-22 DIAGNOSIS — R197 Diarrhea, unspecified: Secondary | ICD-10-CM

## 2013-04-22 DIAGNOSIS — Z720 Tobacco use: Secondary | ICD-10-CM

## 2013-04-22 DIAGNOSIS — G40909 Epilepsy, unspecified, not intractable, without status epilepticus: Secondary | ICD-10-CM

## 2013-04-22 DIAGNOSIS — R945 Abnormal results of liver function studies: Secondary | ICD-10-CM

## 2013-04-22 DIAGNOSIS — K2210 Ulcer of esophagus without bleeding: Secondary | ICD-10-CM

## 2013-04-22 DIAGNOSIS — R600 Localized edema: Secondary | ICD-10-CM

## 2013-04-22 DIAGNOSIS — E669 Obesity, unspecified: Secondary | ICD-10-CM

## 2013-04-22 DIAGNOSIS — R7401 Elevation of levels of liver transaminase levels: Secondary | ICD-10-CM

## 2013-04-22 DIAGNOSIS — F411 Generalized anxiety disorder: Secondary | ICD-10-CM | POA: Diagnosis present

## 2013-04-22 DIAGNOSIS — R319 Hematuria, unspecified: Secondary | ICD-10-CM

## 2013-04-22 DIAGNOSIS — I959 Hypotension, unspecified: Secondary | ICD-10-CM

## 2013-04-22 DIAGNOSIS — F419 Anxiety disorder, unspecified: Secondary | ICD-10-CM

## 2013-04-22 DIAGNOSIS — R0902 Hypoxemia: Secondary | ICD-10-CM

## 2013-04-22 DIAGNOSIS — R601 Generalized edema: Secondary | ICD-10-CM

## 2013-04-22 DIAGNOSIS — B1920 Unspecified viral hepatitis C without hepatic coma: Secondary | ICD-10-CM

## 2013-04-22 DIAGNOSIS — K760 Fatty (change of) liver, not elsewhere classified: Secondary | ICD-10-CM

## 2013-04-22 DIAGNOSIS — E43 Unspecified severe protein-calorie malnutrition: Secondary | ICD-10-CM | POA: Diagnosis present

## 2013-04-22 DIAGNOSIS — E039 Hypothyroidism, unspecified: Secondary | ICD-10-CM

## 2013-04-22 DIAGNOSIS — R21 Rash and other nonspecific skin eruption: Secondary | ICD-10-CM

## 2013-04-22 DIAGNOSIS — D539 Nutritional anemia, unspecified: Secondary | ICD-10-CM

## 2013-04-22 DIAGNOSIS — K709 Alcoholic liver disease, unspecified: Secondary | ICD-10-CM | POA: Diagnosis present

## 2013-04-22 DIAGNOSIS — E538 Deficiency of other specified B group vitamins: Secondary | ICD-10-CM

## 2013-04-22 DIAGNOSIS — R748 Abnormal levels of other serum enzymes: Secondary | ICD-10-CM

## 2013-04-22 DIAGNOSIS — E079 Disorder of thyroid, unspecified: Secondary | ICD-10-CM

## 2013-04-22 DIAGNOSIS — R740 Nonspecific elevation of levels of transaminase and lactic acid dehydrogenase [LDH]: Secondary | ICD-10-CM

## 2013-04-22 DIAGNOSIS — E8809 Other disorders of plasma-protein metabolism, not elsewhere classified: Secondary | ICD-10-CM

## 2013-04-22 DIAGNOSIS — D62 Acute posthemorrhagic anemia: Secondary | ICD-10-CM

## 2013-04-22 DIAGNOSIS — E860 Dehydration: Secondary | ICD-10-CM

## 2013-04-22 DIAGNOSIS — R112 Nausea with vomiting, unspecified: Secondary | ICD-10-CM

## 2013-04-22 DIAGNOSIS — F3289 Other specified depressive episodes: Secondary | ICD-10-CM | POA: Diagnosis present

## 2013-04-22 DIAGNOSIS — R531 Weakness: Secondary | ICD-10-CM

## 2013-04-22 DIAGNOSIS — K209 Esophagitis, unspecified without bleeding: Secondary | ICD-10-CM

## 2013-04-22 DIAGNOSIS — D649 Anemia, unspecified: Secondary | ICD-10-CM

## 2013-04-22 DIAGNOSIS — D696 Thrombocytopenia, unspecified: Secondary | ICD-10-CM

## 2013-04-22 DIAGNOSIS — N390 Urinary tract infection, site not specified: Secondary | ICD-10-CM

## 2013-04-22 DIAGNOSIS — K279 Peptic ulcer, site unspecified, unspecified as acute or chronic, without hemorrhage or perforation: Secondary | ICD-10-CM

## 2013-04-22 DIAGNOSIS — F17200 Nicotine dependence, unspecified, uncomplicated: Secondary | ICD-10-CM | POA: Diagnosis present

## 2013-04-22 DIAGNOSIS — E861 Hypovolemia: Secondary | ICD-10-CM

## 2013-04-22 DIAGNOSIS — R109 Unspecified abdominal pain: Secondary | ICD-10-CM

## 2013-04-22 DIAGNOSIS — R7989 Other specified abnormal findings of blood chemistry: Secondary | ICD-10-CM

## 2013-04-22 DIAGNOSIS — M51379 Other intervertebral disc degeneration, lumbosacral region without mention of lumbar back pain or lower extremity pain: Secondary | ICD-10-CM | POA: Diagnosis present

## 2013-04-22 DIAGNOSIS — R9431 Abnormal electrocardiogram [ECG] [EKG]: Secondary | ICD-10-CM

## 2013-04-22 DIAGNOSIS — Z833 Family history of diabetes mellitus: Secondary | ICD-10-CM

## 2013-04-22 HISTORY — DX: Fatty (change of) liver, not elsewhere classified: K76.0

## 2013-04-22 HISTORY — DX: Heart disease, unspecified: I51.9

## 2013-04-22 HISTORY — DX: Ulcer of esophagus without bleeding: K22.10

## 2013-04-22 HISTORY — DX: Hypomagnesemia: E83.42

## 2013-04-22 LAB — COMPREHENSIVE METABOLIC PANEL
ALBUMIN: 1.9 g/dL — AB (ref 3.5–5.2)
ALT: 11 U/L (ref 0–35)
AST: 46 U/L — ABNORMAL HIGH (ref 0–37)
Alkaline Phosphatase: 156 U/L — ABNORMAL HIGH (ref 39–117)
BUN: 3 mg/dL — ABNORMAL LOW (ref 6–23)
CO2: 35 mEq/L — ABNORMAL HIGH (ref 19–32)
Calcium: 8 mg/dL — ABNORMAL LOW (ref 8.4–10.5)
Chloride: 83 mEq/L — ABNORMAL LOW (ref 96–112)
Creatinine, Ser: 0.62 mg/dL (ref 0.50–1.10)
GFR calc Af Amer: >90 mL/min (ref >90)
GFR calc non Af Amer: >90 mL/min (ref >90)
Glucose, Bld: 81 mg/dL (ref 70–99)
POTASSIUM: 2.8 meq/L — AB (ref 3.7–5.3)
Sodium: 131 mEq/L — ABNORMAL LOW (ref 137–147)
TOTAL PROTEIN: 6.8 g/dL (ref 6.0–8.3)
Total Bilirubin: 2.1 mg/dL — ABNORMAL HIGH (ref 0.3–1.2)

## 2013-04-22 LAB — CBC WITH DIFFERENTIAL/PLATELET
Basophils Absolute: 0 10*3/uL (ref 0.0–0.1)
Basophils Relative: 0 % (ref 0–1)
EOS ABS: 0.1 10*3/uL (ref 0.0–0.7)
Eosinophils Relative: 0 % (ref 0–5)
HCT: 28.3 % — ABNORMAL LOW (ref 36.0–46.0)
Hemoglobin: 9.3 g/dL — ABNORMAL LOW (ref 12.0–15.0)
LYMPHS ABS: 1.6 10*3/uL (ref 0.7–4.0)
Lymphocytes Relative: 14 % (ref 12–46)
MCH: 33.1 pg (ref 26.0–34.0)
MCHC: 32.9 g/dL (ref 30.0–36.0)
MCV: 100.7 fL — ABNORMAL HIGH (ref 78.0–100.0)
Monocytes Absolute: 0.9 10*3/uL (ref 0.1–1.0)
Monocytes Relative: 8 % (ref 3–12)
NEUTROS PCT: 78 % — AB (ref 43–77)
Neutro Abs: 8.8 10*3/uL — ABNORMAL HIGH (ref 1.7–7.7)
Platelets: 190 10*3/uL (ref 150–400)
RBC: 2.81 MIL/uL — AB (ref 3.87–5.11)
RDW: 15.3 % (ref 11.5–15.5)
WBC: 11.3 10*3/uL — ABNORMAL HIGH (ref 4.0–10.5)

## 2013-04-22 LAB — URINALYSIS, ROUTINE W REFLEX MICROSCOPIC
BILIRUBIN URINE: NEGATIVE
Glucose, UA: NEGATIVE mg/dL
HGB URINE DIPSTICK: NEGATIVE
Ketones, ur: NEGATIVE mg/dL
Leukocytes, UA: NEGATIVE
Nitrite: NEGATIVE
PH: 5.5 (ref 5.0–8.0)
Protein, ur: NEGATIVE mg/dL
UROBILINOGEN UA: 0.2 mg/dL (ref 0.0–1.0)

## 2013-04-22 LAB — INFLUENZA PANEL BY PCR (TYPE A & B)
H1N1 flu by pcr: NOT DETECTED
Influenza A By PCR: NEGATIVE
Influenza B By PCR: NEGATIVE

## 2013-04-22 LAB — TROPONIN I: Troponin I: <0.3 ng/mL (ref <0.30)

## 2013-04-22 LAB — PRO B NATRIURETIC PEPTIDE: PRO B NATRI PEPTIDE: 254.9 pg/mL — AB (ref 0–125)

## 2013-04-22 LAB — LACTIC ACID, PLASMA: LACTIC ACID, VENOUS: 3.4 mmol/L — AB (ref 0.5–2.2)

## 2013-04-22 LAB — AMMONIA: Ammonia: 43 umol/L (ref 11–60)

## 2013-04-22 MED ORDER — ACETAMINOPHEN 650 MG RE SUPP: 650.0000 mg | Freq: Four times a day (QID) | RECTAL | Status: DC | PRN

## 2013-04-22 MED ORDER — POTASSIUM CHLORIDE IN NACL 40-0.9 MEQ/L-% IV SOLN
INTRAVENOUS | Status: DC
  Administered 2013-04-22: 20:00:00 via INTRAVENOUS

## 2013-04-22 MED ORDER — POTASSIUM CHLORIDE CRYS ER 20 MEQ PO TBCR
20.0000 meq | EXTENDED_RELEASE_TABLET | Freq: Two times a day (BID) | ORAL | Status: DC
Start: 2013-04-22 — End: 2013-04-24
  Administered 2013-04-22 – 2013-04-24 (×4): 20 meq via ORAL
  Filled 2013-04-22 (×4): qty 1

## 2013-04-22 MED ORDER — KETOROLAC TROMETHAMINE 30 MG/ML IJ SOLN
30.0000 mg | Freq: Once | INTRAMUSCULAR | Status: AC
  Administered 2013-04-22: 30 mg via INTRAVENOUS
  Filled 2013-04-22: qty 1

## 2013-04-22 MED ORDER — PANTOPRAZOLE SODIUM 40 MG PO TBEC
40.0000 mg | DELAYED_RELEASE_TABLET | Freq: Two times a day (BID) | ORAL | Status: DC
  Administered 2013-04-23 – 2013-04-24 (×3): 40 mg via ORAL
  Filled 2013-04-22 (×3): qty 1

## 2013-04-22 MED ORDER — LEVOTHYROXINE SODIUM 75 MCG PO TABS
75.0000 ug | ORAL_TABLET | Freq: Every day | ORAL | Status: DC
Start: 2013-04-23 — End: 2013-04-24
  Administered 2013-04-23 – 2013-04-24 (×2): 75 ug via ORAL
  Filled 2013-04-22 (×2): qty 1

## 2013-04-22 MED ORDER — SODIUM CHLORIDE 0.9 % IV SOLN
Freq: Once | INTRAVENOUS | Status: AC
  Administered 2013-04-22: 12:00:00 via INTRAVENOUS

## 2013-04-22 MED ORDER — SODIUM CHLORIDE 0.9 % IV BOLUS (SEPSIS)
1000.0000 mL | Freq: Once | INTRAVENOUS | Status: AC
  Administered 2013-04-22: 1000 mL via INTRAVENOUS

## 2013-04-22 MED ORDER — SODIUM CHLORIDE 0.9 % IV BOLUS (SEPSIS)
500.0000 mL | Freq: Once | INTRAVENOUS | Status: AC
  Administered 2013-04-22: 500 mL via INTRAVENOUS

## 2013-04-22 MED ORDER — ALBUTEROL SULFATE (2.5 MG/3ML) 0.083% IN NEBU: 2.5000 mg | INHALATION_SOLUTION | Freq: Four times a day (QID) | RESPIRATORY_TRACT | Status: DC

## 2013-04-22 MED ORDER — ONDANSETRON HCL 4 MG/2ML IJ SOLN
4.0000 mg | Freq: Four times a day (QID) | INTRAMUSCULAR | Status: DC | PRN
  Administered 2013-04-22 – 2013-04-24 (×4): 4 mg via INTRAVENOUS
  Filled 2013-04-22 (×4): qty 2

## 2013-04-22 MED ORDER — IPRATROPIUM BROMIDE 0.02 % IN SOLN: 0.5000 mg | Freq: Four times a day (QID) | RESPIRATORY_TRACT | Status: DC

## 2013-04-22 MED ORDER — OXYCODONE HCL 5 MG PO TABS
5.0000 mg | ORAL_TABLET | Freq: Once | ORAL | Status: AC
  Administered 2013-04-22: 5 mg via ORAL
  Filled 2013-04-22: qty 1

## 2013-04-22 MED ORDER — VITAMIN B-1 100 MG PO TABS
100.0000 mg | ORAL_TABLET | Freq: Every day | ORAL | Status: DC
  Administered 2013-04-23 – 2013-04-24 (×2): 100 mg via ORAL
  Filled 2013-04-22 (×2): qty 1

## 2013-04-22 MED ORDER — IPRATROPIUM-ALBUTEROL 0.5-2.5 (3) MG/3ML IN SOLN
3.0000 mL | Freq: Four times a day (QID) | RESPIRATORY_TRACT | Status: DC
  Administered 2013-04-22 – 2013-04-24 (×6): 3 mL via RESPIRATORY_TRACT
  Filled 2013-04-22 (×7): qty 3

## 2013-04-22 MED ORDER — ACETAMINOPHEN 325 MG PO TABS: 650.0000 mg | ORAL_TABLET | Freq: Four times a day (QID) | ORAL | Status: DC | PRN

## 2013-04-22 MED ORDER — ONDANSETRON HCL 4 MG PO TABS
4.0000 mg | ORAL_TABLET | Freq: Four times a day (QID) | ORAL | Status: DC | PRN
  Administered 2013-04-24: 4 mg via ORAL
  Filled 2013-04-22: qty 1

## 2013-04-22 MED ORDER — ZOLPIDEM TARTRATE 5 MG PO TABS
5.0000 mg | ORAL_TABLET | Freq: Once | ORAL | Status: AC
  Administered 2013-04-22: 5 mg via ORAL
  Filled 2013-04-22: qty 1

## 2013-04-22 MED ORDER — POTASSIUM CHLORIDE CRYS ER 20 MEQ PO TBCR
40.0000 meq | EXTENDED_RELEASE_TABLET | Freq: Once | ORAL | Status: AC
  Administered 2013-04-22: 40 meq via ORAL
  Filled 2013-04-22: qty 2

## 2013-04-22 MED ORDER — SODIUM CHLORIDE 0.9 % IJ SOLN
3.0000 mL | Freq: Two times a day (BID) | INTRAMUSCULAR | Status: DC
  Administered 2013-04-23: 3 mL via INTRAVENOUS

## 2013-04-22 MED ORDER — PREGABALIN 50 MG PO CAPS
100.0000 mg | ORAL_CAPSULE | Freq: Three times a day (TID) | ORAL | Status: DC
  Administered 2013-04-22 – 2013-04-24 (×5): 100 mg via ORAL
  Filled 2013-04-22 (×5): qty 2

## 2013-04-22 MED ORDER — ALBUTEROL SULFATE (2.5 MG/3ML) 0.083% IN NEBU
2.5000 mg | INHALATION_SOLUTION | RESPIRATORY_TRACT | Status: DC | PRN
  Administered 2013-04-24: 2.5 mg via RESPIRATORY_TRACT
  Filled 2013-04-22: qty 3

## 2013-04-22 MED ORDER — FOLIC ACID 1 MG PO TABS
1.0000 mg | ORAL_TABLET | Freq: Every day | ORAL | Status: DC
  Administered 2013-04-23 – 2013-04-24 (×2): 1 mg via ORAL
  Filled 2013-04-22 (×2): qty 1

## 2013-04-22 MED ORDER — POTASSIUM CHLORIDE 10 MEQ/100ML IV SOLN
10.0000 meq | Freq: Once | INTRAVENOUS | Status: AC
  Administered 2013-04-22: 10 meq via INTRAVENOUS
  Filled 2013-04-22: qty 100

## 2013-04-22 MED ORDER — ENOXAPARIN SODIUM 40 MG/0.4ML ~~LOC~~ SOLN
40.0000 mg | SUBCUTANEOUS | Status: DC
  Administered 2013-04-22: 40 mg via SUBCUTANEOUS
  Filled 2013-04-22: qty 0.4

## 2013-04-22 NOTE — ED Provider Notes (Signed)
CSN: [id number]     Arrival date & time 04/22/13  1023 History  This chart was scribed for Maudry Diego, MD by Roxan Diesel, ED scribe.  This patient was seen in room APA10/APA10 and the patient's care was started at 10:40 AM.   Chief Complaint  Patient presents with  . Weakness    Patient is a 53 y.o. female presenting with weakness. The history is provided by the patient. No language interpreter was used.  Weakness This is a new problem. Episode onset: this morning. The problem occurs constantly. Associated symptoms comments: Cough. She has tried nothing for the symptoms.    HPI Comments: [name] is a 53 y.o. female with h/o hepatitis C, GBS, legal blindness, arthritis, and obesity, brought in by EMS to the Emergency Department complaining of severe weakness.  This morning pt was evaluated by her home health nurse this morning who observed severe generalized weakness and called EMS.  EMS states pt started having some unresponsiveness in route but would respond to voice.  On arrival she is initially sleeping but when aroused is responsive and able to converse normally.  Pt also complains of 2 weeks of persistent cough productive of green sputum.  She saw her PCP for this and was placed on a nebulizer.  She also notes that she was found to have potassium of 2 at a recent PCP visit.  Pt also complains of generalized body aches but she states this may be attributable to her GBS.  She notes that she was recently discharged from hospitalization at [facility].  She has longstanding fluid in her legs and is taking fluid pills.   Past Medical History  Diagnosis Date  . Guillain-Barre   . Blind   . Neuropathy   . Thyroid disease   . Back pain   . Arthritis   . Seizures   . UTI (lower urinary tract infection)   . Anxiety   . Depression   . DDD (degenerative disc disease), lumbar   . Hepatitis C antibody test positive     HCV RNA is negative 03/2013    Past Surgical History   Procedure Laterality Date  . Cholecystectomy    . Tubal ligation    . Femur fracture surgery    . Ankle surgery    . Back surgery    . Esophagogastroduodenoscopy N/A 03/21/2013    Procedure: ESOPHAGOGASTRODUODENOSCOPY (EGD);  Surgeon: Daneil Dolin, MD;  Location: [facility];  Service: Endoscopy;  Laterality: N/A;    History reviewed. No pertinent family history.   History  Substance Use Topics  . Smoking status: Current Every Day Smoker -- 0.50 packs/day    Types: Cigarettes  . Smokeless tobacco: Not on file  . Alcohol Use: No    OB History   Grav Para Term Preterm Abortions TAB SAB Ect Mult Living                  Review of Systems  HENT: Positive for congestion. Negative for ear discharge and sinus pressure.   Eyes: Negative for discharge.  Respiratory: Positive for cough.   Cardiovascular: Positive for leg swelling.  Gastrointestinal: Negative for diarrhea.  Genitourinary: Negative for frequency and hematuria.  Musculoskeletal: Positive for myalgias.  Skin: Negative for rash.  Neurological: Positive for weakness. Negative for seizures.  Psychiatric/Behavioral: Negative for hallucinations.     Allergies  Neurontin; Acetaminophen; Eggs or egg-derived products; and Codeine  Home Medications   Current Outpatient Rx  Name  Route  Sig  Dispense  Refill  . cyanocobalamin (,VITAMIN B-12,) 1000 MCG/ML injection   Intramuscular   Inject 1,000 mcg into the muscle once a week. Sunday         . folic acid (FOLVITE) 1 MG tablet   Oral   Take 1 tablet (1 mg total) by mouth daily.         Marland Kitchen levothyroxine (SYNTHROID, LEVOTHROID) 75 MCG tablet   Oral   Take 1 tablet (75 mcg total) by mouth every morning.   30 tablet   0   . ondansetron (ZOFRAN) 4 MG tablet   Oral   Take 1 tablet (4 mg total) by mouth every 6 (six) hours as needed for nausea.   20 tablet   0   . oxyCODONE (OXY IR/ROXICODONE) 5 MG immediate release tablet   Oral   Take 1 tablet (5 mg  total) by mouth every 6 (six) hours as needed for severe pain.   20 tablet   0   . pantoprazole (PROTONIX) 40 MG tablet   Oral   Take 1 tablet (40 mg total) by mouth 2 (two) times daily before a meal.   60 tablet   0   . potassium chloride SA (K-DUR,KLOR-CON) 20 MEQ tablet   Oral   Take 1 tablet (20 mEq total) by mouth 2 (two) times daily.   60 tablet   0   . pregabalin (LYRICA) 100 MG capsule   Oral   Take 100 mg by mouth 3 (three) times daily.          Marland Kitchen thiamine 100 MG tablet   Oral   Take 1 tablet (100 mg total) by mouth daily.   30 tablet   0    BP 95/65  Pulse 91  Temp(Src) 98.3 F (36.8 C) (Oral)  Resp 16  SpO2 100%  Physical Exam  Nursing note and vitals reviewed. Constitutional: She is oriented to person, place, and time. She appears well-developed.  HENT:  Head: Normocephalic.  Eyes: Conjunctivae and EOM are normal. No scleral icterus.  Neck: Neck supple. No thyromegaly present.  Cardiovascular: Normal rate and regular rhythm.  Exam reveals no gallop and no friction rub.   No murmur heard. Pulmonary/Chest: No stridor. She has no wheezes. She has no rales. She exhibits no tenderness.  Abdominal: She exhibits no distension. There is tenderness (mild, generalized). There is no rebound.  Musculoskeletal: Normal range of motion. She exhibits edema (3+ in bilateral ankles).  Lymphadenopathy:    She has no cervical adenopathy.  Neurological: She is alert and oriented to person, place, and time. She exhibits normal muscle tone. Coordination normal.  Skin: No rash noted. No erythema.  Psychiatric: She has a normal mood and affect. Her behavior is normal.    ED Course  Procedures (including critical care time)  DIAGNOSTIC STUDIES: Oxygen Saturation is 100% on room air, normal by my interpretation.    COORDINATION OF CARE: 10:45 AM-Discussed treatment plan which includes IV fluids, CXR, EKG and labs with pt at bedside and pt agreed to plan.   12:01  PM-Informed pt that labs reveal hypokalemia.  Pt states she is still in severe pain.  Discussed treatment plan which includes admission with pt at bedside and pt agreed to plan.     Labs Review Labs Reviewed  CBC WITH DIFFERENTIAL - Abnormal; Notable for the following:    WBC 11.3 (*)    RBC 2.81 (*)    Hemoglobin 9.3 (*)  HCT 28.3 (*)    MCV 100.7 (*)    Neutrophils Relative % 78 (*)    Neutro Abs 8.8 (*)    All other components within normal limits  COMPREHENSIVE METABOLIC PANEL - Abnormal; Notable for the following:    Sodium 131 (*)    Potassium 2.8 (*)    Chloride 83 (*)    CO2 35 (*)    BUN 3 (*)    Calcium 8.0 (*)    Albumin 1.9 (*)    AST 46 (*)    Alkaline Phosphatase 156 (*)    Total Bilirubin 2.1 (*)    All other components within normal limits  URINALYSIS, ROUTINE W REFLEX MICROSCOPIC - Abnormal; Notable for the following:    Specific Gravity, Urine <1.005 (*)    All other components within normal limits  PRO B NATRIURETIC PEPTIDE - Abnormal; Notable for the following:    Pro B Natriuretic peptide (BNP) 254.9 (*)    All other components within normal limits  AMMONIA  TROPONIN I  INFLUENZA PANEL BY PCR (TYPE A & B, H1N1)    Imaging Review Dg Chest Portable 1 View  04/22/2013   CLINICAL DATA:  Altered mental status and weakness  EXAM: PORTABLE CHEST - 1 VIEW  COMPARISON:  Portable chest x-ray dated March 19, 2013.  FINDINGS: The lungs are hypoinflated. There is no focal infiltrate. There is partial obscuration of the left hemidiaphragm which is stable. The central pulmonary vascularity is mildly prominent. The cardiac silhouette is top-normal in size. There is no cephalization of the pulmonary vascular pattern. There is no pleural effusion or pneumothorax. There is old deformity of the distal aspect of the right clavicle. The heart size and mediastinal contours are within normal limits. Both lungs are clear. The visualized skeletal structures are unremarkable.   IMPRESSION: There is bilateral pulmonary hypo inflation. There is no definite evidence of pneumonia nor CHF or other acute cardiopulmonary abnormality.   Electronically Signed   By: David  Martinique   On: 04/22/2013 11:14     CRITICAL CARE Performed by: Nataliee Shurtz L Total critical care time35 Critical care time was exclusive of separately billable procedures and treating other patients. Critical care was necessary to treat or prevent imminent or life-threatening deterioration. Critical care was time spent personally by me on the following activities: development of treatment plan with patient and/or surrogate as well as nursing, discussions with consultants, evaluation of patient's response to treatment, examination of patient, obtaining history from patient or surrogate, ordering and performing treatments and interventions, ordering and review of laboratory studies, ordering and review of radiographic studies, pulse oximetry and re-evaluation of patient's condition.  MDM  Hypokalemia   Admit The chart was scribed for me under my direct supervision.  I personally performed the history, physical, and medical decision making and all procedures in the evaluation of this patient.Maudry Diego, MD 04/22/13 [id number]

## 2013-04-22 NOTE — ED Notes (Signed)
CRITICAL VALUE ALERT  Critical value received:  Potassium 2.8  Date of notification:  04/22/13  Time of notification:  2035  Critical value read back:yes  Nurse who received alert: Parthenia Ames  MD notified (1st page):  Dr. Roderic Palau  Time of first page:  [phone]  MD notified (2nd page):  Time of second page:  Responding MD:  Dr. Roderic Palau  Time MD responded:  [phone]

## 2013-04-22 NOTE — H&P (Signed)
[facility] History and Physical  [name] [id number] DOB: Aug 27, 1960 DOA: 04/22/2013  Referring physician: Dr. Roderic Palau PCP: Celedonio Savage, MD   Chief Complaint: generalized weakness  HPI: [name] is a 53 y.o. female with multiple medical problems who was recently in the hospital and 12/14 and was treated for severe erosive esophagitis. Patient reports that since her last discharge, she has been increasingly weak. She has had difficulty walking. Her symptoms have been particularly worse over the past week. She also reports cramps in her hands bilaterally. She has had difficulty with lower extremity edema for the past one to 2 months. She has been taking Lasix 40 mg by mouth twice a day without significant improvement in her edema. She is has not had any significant chest pain, shortness of breath, fever, change in cough, dysuria, diarrhea. She does admit to chronic back and leg pain. She was brought to the emergency room for evaluation where she was noted to be significantly hypokalemic. The patient is being admitted for further treatments   Review of Systems: Pertinent positives as per HPI, otherwise negative  Past Medical History  Diagnosis Date  . Guillain-Barre   . Blind   . Neuropathy   . Thyroid disease   . Back pain   . Arthritis   . Seizures   . UTI (lower urinary tract infection)   . Anxiety   . Depression   . DDD (degenerative disc disease), lumbar   . Hepatitis C antibody test positive     HCV RNA is negative 03/2013   Past Surgical History  Procedure Laterality Date  . Cholecystectomy    . Tubal ligation    . Femur fracture surgery    . Ankle surgery    . Back surgery    . Esophagogastroduodenoscopy N/A 03/21/2013    Procedure: ESOPHAGOGASTRODUODENOSCOPY (EGD);  Surgeon: Daneil Dolin, MD;  Location: [facility];  Service: Endoscopy;  Laterality: N/A;   Social History:  reports that she has been smoking Cigarettes.  She has been smoking about 0.50  packs per day. She does not have any smokeless tobacco history on file. She reports that she does not drink alcohol or use illicit drugs.  Allergies  Allergen Reactions  . Neurontin [Gabapentin] Anaphylaxis  . Acetaminophen Itching and Nausea And Vomiting  . Eggs Or Egg-Derived Products Nausea And Vomiting  . Codeine Itching and Nausea And Vomiting    Patient is able to take morphine, demerol and fentanyl with out any type of side effects    Family History: states there is diabetes and arthritis in the family   Prior to Admission medications   Medication Sig Start Date End Date Taking? Authorizing Provider  cyanocobalamin (,VITAMIN B-12,) 1000 MCG/ML injection Inject 1,000 mcg into the muscle every 30 (thirty) days. Sunday   Yes Historical Provider, MD  folic acid (FOLVITE) 1 MG tablet Take 1 tablet (1 mg total) by mouth daily. 03/26/13  Yes Samuella Cota, MD  levothyroxine (SYNTHROID, LEVOTHROID) 75 MCG tablet Take 1 tablet (75 mcg total) by mouth every morning. 05/01/12  Yes Nimish Luther Parody, MD  ondansetron (ZOFRAN) 4 MG tablet Take 1 tablet (4 mg total) by mouth every 6 (six) hours as needed for nausea. 03/26/13  Yes Samuella Cota, MD  pantoprazole (PROTONIX) 40 MG tablet Take 1 tablet (40 mg total) by mouth 2 (two) times daily before a meal. 03/26/13  Yes Samuella Cota, MD  potassium chloride SA (K-DUR,KLOR-CON) 20 MEQ tablet  Take 1 tablet (20 mEq total) by mouth 2 (two) times daily. 05/01/12  Yes Nimish Luther Parody, MD  pregabalin (LYRICA) 100 MG capsule Take 100 mg by mouth 3 (three) times daily.    Yes Historical Provider, MD  thiamine 100 MG tablet Take 1 tablet (100 mg total) by mouth daily. 05/01/12  Yes Nimish Luther Parody, MD   Physical Exam: Filed Vitals:   04/22/13 1719  BP: 97/63  Pulse: 87  Temp:   Resp:     BP 97/63  Pulse 87  Temp(Src) 98.2 F (36.8 C) (Oral)  Resp 13  Ht 5\' 4"  (1.626 m)  SpO2 98%  General:  Appears anxious, tearful Eyes: PERRL, normal  lids, irises & conjunctiva ENT: grossly normal hearing, lips & tongue Neck: no LAD, masses or thyromegaly Cardiovascular: RRR, no m/r/g. 2+ edema b/l Telemetry: SR, no arrhythmias  Respiratory: CTA bilaterally, no w/r/r. Normal respiratory effort. Abdomen: soft, nt, obese, bs+ Skin: no rash or induration seen on limited exam Musculoskeletal: grossly normal tone BUE/BLE Psychiatric: appears anxious and depressed Neurologic: grossly non-focal.          Labs on Admission:  Basic Metabolic Panel:  Recent Labs Lab 04/22/13 1047  NA 131*  K 2.8*  CL 83*  CO2 35*  GLUCOSE 81  BUN 3*  CREATININE 0.62  CALCIUM 8.0*   Liver Function Tests:  Recent Labs Lab 04/22/13 1047  AST 46*  ALT 11  ALKPHOS 156*  BILITOT 2.1*  PROT 6.8  ALBUMIN 1.9*   No results found for this basename: LIPASE, AMYLASE,  in the last 168 hours  Recent Labs Lab 04/22/13 1047  AMMONIA 43   CBC:  Recent Labs Lab 04/22/13 1047  WBC 11.3*  NEUTROABS 8.8*  HGB 9.3*  HCT 28.3*  MCV 100.7*  PLT 190   Cardiac Enzymes:  Recent Labs Lab 04/22/13 1047  TROPONINI <0.30    BNP (last 3 results)  Recent Labs  04/22/13 1047  PROBNP 254.9*   CBG: No results found for this basename: GLUCAP,  in the last 168 hours  Radiological Exams on Admission: Dg Chest Portable 1 View  04/22/2013   CLINICAL DATA:  Altered mental status and weakness  EXAM: PORTABLE CHEST - 1 VIEW  COMPARISON:  Portable chest x-ray dated March 19, 2013.  FINDINGS: The lungs are hypoinflated. There is no focal infiltrate. There is partial obscuration of the left hemidiaphragm which is stable. The central pulmonary vascularity is mildly prominent. The cardiac silhouette is top-normal in size. There is no cephalization of the pulmonary vascular pattern. There is no pleural effusion or pneumothorax. There is old deformity of the distal aspect of the right clavicle. The heart size and mediastinal contours are within normal limits.  Both lungs are clear. The visualized skeletal structures are unremarkable.  IMPRESSION: There is bilateral pulmonary hypo inflation. There is no definite evidence of pneumonia nor CHF or other acute cardiopulmonary abnormality.   Electronically Signed   By: David  Martinique   On: 04/22/2013 11:14    EKG: Independently reviewed. No acute ST-T changes  Assessment/Plan Active Problems:   Hypokalemia   Abnormal LFTs   Hypothyroid   Anemia   Chronic pain   Generalized weakness   Hypotension, unspecified   Anasarca   Hypoalbuminemia   1. Generalized weakness. Likely related to volume depletion from overdiuresis. Patient will be placed on gentle hydration. We'll request a physical therapy consultation. 2. Hypokalemia. Likely related to Lasix use. This will be repleted we  will also check magnesium. 3. Hypotension. Likely related to volume depletion. This also would explain her lactic acid. She does not appear septic or toxic at this time. We'll continue with hydration. 4. Anasarca. She does have evidence of lower extremity edema. This is likely multifactorial due to venous stasis as well as hypoalbuminemia. The patient recently had echocardiogram done approximately one year ago which showed a normal ejection fraction. Her urinalysis does not show any significant proteinuria. There is mention of possible liver disease, with possible history of hepatitis C. In any case, her albumin levels are low at 1.9, which I anticipate will be lower after hydration. We will request a nutrition consult. May also need albumin infusions with Lasix once she is adequately hydrated. 5. Anemia, likely due to chronic disease, stable 6. Chronic pain. Continue outpatient regimen. 7. Esophagitis. Patient currently does not have any nausea or vomiting. Continue Protonix. 8. Elevated liver function tests. Patient's liver functions tests are mildly elevated, but this is a marked improvement since her prior levels. Continue to  follow.   Code Status: full code Family Communication: discussed with patient and husband at the bedside Disposition Plan: discharge home once improved  Time spent: 10mins  Cyrus Ramsburg [facility] Pager [phone]

## 2013-04-22 NOTE — ED Notes (Signed)
Pt initially lethargic and very hard to keep awake but easily arousable. Upon EDP assessment pt very alert and responsive to EDP questions. Pt tearful and anxious at time of EDP assessment. Primary RN aware.

## 2013-04-22 NOTE — ED Notes (Signed)
Evaluated by home health nurse today and sent here due to weakness/dizziness. EMS states pt started having some unresponsiveness in route but would respond to voice. pta would only respond to painful stimuli. Pt arrived lethargic.

## 2013-04-23 ENCOUNTER — Encounter (HOSPITAL_COMMUNITY): Payer: Self-pay | Admitting: Internal Medicine

## 2013-04-23 DIAGNOSIS — K760 Fatty (change of) liver, not elsewhere classified: Secondary | ICD-10-CM

## 2013-04-23 DIAGNOSIS — K208 Other esophagitis without bleeding: Secondary | ICD-10-CM

## 2013-04-23 DIAGNOSIS — R5381 Other malaise: Secondary | ICD-10-CM

## 2013-04-23 DIAGNOSIS — I519 Heart disease, unspecified: Secondary | ICD-10-CM | POA: Diagnosis present

## 2013-04-23 DIAGNOSIS — K7689 Other specified diseases of liver: Secondary | ICD-10-CM

## 2013-04-23 DIAGNOSIS — K2210 Ulcer of esophagus without bleeding: Secondary | ICD-10-CM | POA: Diagnosis present

## 2013-04-23 DIAGNOSIS — R5383 Other fatigue: Secondary | ICD-10-CM

## 2013-04-23 HISTORY — DX: Hypomagnesemia: E83.42

## 2013-04-23 HISTORY — DX: Fatty (change of) liver, not elsewhere classified: K76.0

## 2013-04-23 LAB — CBC
HEMATOCRIT: 26.1 % — AB (ref 36.0–46.0)
Hemoglobin: 8.7 g/dL — ABNORMAL LOW (ref 12.0–15.0)
MCH: 33.6 pg (ref 26.0–34.0)
MCHC: 33.3 g/dL (ref 30.0–36.0)
MCV: 100.8 fL — AB (ref 78.0–100.0)
Platelets: 179 10*3/uL (ref 150–400)
RBC: 2.59 MIL/uL — ABNORMAL LOW (ref 3.87–5.11)
RDW: 15.4 % (ref 11.5–15.5)
WBC: 12.7 10*3/uL — AB (ref 4.0–10.5)

## 2013-04-23 LAB — COMPREHENSIVE METABOLIC PANEL
ALBUMIN: 1.7 g/dL — AB (ref 3.5–5.2)
ALT: 10 U/L (ref 0–35)
AST: 39 U/L — ABNORMAL HIGH (ref 0–37)
Alkaline Phosphatase: 151 U/L — ABNORMAL HIGH (ref 39–117)
BUN: 5 mg/dL — ABNORMAL LOW (ref 6–23)
CHLORIDE: 88 meq/L — AB (ref 96–112)
CO2: 31 mEq/L (ref 19–32)
Calcium: 7.4 mg/dL — ABNORMAL LOW (ref 8.4–10.5)
Creatinine, Ser: 0.68 mg/dL (ref 0.50–1.10)
GFR calc Af Amer: >90 mL/min (ref >90)
GFR calc non Af Amer: >90 mL/min (ref >90)
Glucose, Bld: 103 mg/dL — ABNORMAL HIGH (ref 70–99)
Potassium: 3.9 mEq/L (ref 3.7–5.3)
SODIUM: 131 meq/L — AB (ref 137–147)
TOTAL PROTEIN: 6.1 g/dL (ref 6.0–8.3)
Total Bilirubin: 1.7 mg/dL — ABNORMAL HIGH (ref 0.3–1.2)

## 2013-04-23 LAB — TSH: TSH: 1.01 u[IU]/mL (ref 0.350–4.500)

## 2013-04-23 LAB — MAGNESIUM: Magnesium: 1.1 mg/dL — ABNORMAL LOW (ref 1.5–2.5)

## 2013-04-23 MED ORDER — ENOXAPARIN SODIUM 60 MG/0.6ML ~~LOC~~ SOLN
0.5000 mg/kg | SUBCUTANEOUS | Status: DC
  Administered 2013-04-23: 23:00:00 55 mg via SUBCUTANEOUS
  Filled 2013-04-23: qty 0.6

## 2013-04-23 MED ORDER — OXYCODONE HCL 5 MG PO TABS
5.0000 mg | ORAL_TABLET | ORAL | Status: DC | PRN
  Administered 2013-04-23 – 2013-04-24 (×6): 10 mg via ORAL
  Filled 2013-04-23 (×6): qty 2

## 2013-04-23 MED ORDER — MAGNESIUM SULFATE 40 MG/ML IJ SOLN
2.0000 g | Freq: Once | INTRAMUSCULAR | Status: AC
  Administered 2013-04-23: 2 g via INTRAVENOUS
  Filled 2013-04-23: qty 50

## 2013-04-23 MED ORDER — MAGNESIUM SULFATE 50 % IJ SOLN: 2.0000 g | Freq: Once | INTRAVENOUS | Status: DC

## 2013-04-23 MED ORDER — OXYCODONE HCL 5 MG PO TABS
5.0000 mg | ORAL_TABLET | Freq: Four times a day (QID) | ORAL | Status: DC | PRN
  Administered 2013-04-23 (×2): 10 mg via ORAL
  Filled 2013-04-23 (×2): qty 2

## 2013-04-23 MED ORDER — POTASSIUM CHLORIDE IN NACL 20-0.9 MEQ/L-% IV SOLN
INTRAVENOUS | Status: DC
  Administered 2013-04-23 – 2013-04-24 (×2): via INTRAVENOUS

## 2013-04-23 MED ORDER — ZOLPIDEM TARTRATE 5 MG PO TABS
5.0000 mg | ORAL_TABLET | Freq: Once | ORAL | Status: AC
  Administered 2013-04-23: 5 mg via ORAL
  Filled 2013-04-23: qty 1

## 2013-04-23 NOTE — Progress Notes (Signed)
[facility] PROGRESS NOTE  [name] HYW:[id number] DOB: 1960/08/06 DOA: 04/22/2013 PCP: Celedonio Savage, MD    Code Status: Full code Family Communication: Discussed with husband Disposition Plan: Discharge to home within the next 24-48 hours   Consultants:  None  Procedures:  None  Antibiotics:  None  HPI/Subjective: The patient has a lot of complaints. She complains of chronic low back pain, generalized weakness, and generalized swelling. Her husband reports that the swelling in her legs has subsided. She denies subjective fever or chills or cough or flulike symptoms.  Objective: Filed Vitals:   04/23/13 0945  BP: 120/86  Pulse: 112  Temp:   Resp:    temperature 98.6. Respiratory rate 18. Oxygen saturation 96%.    Intake/Output Summary (Last 24 hours) at 04/23/13 1131 Last data filed at 04/23/13 1005  Gross per 24 hour  Intake   1253 ml  Output    850 ml  Net    403 ml   Filed Weights   04/23/13 1053  Weight: 106.369 kg (234 lb 8 oz)    Exam:   General:  Obese 53 year old Caucasian woman sitting up in a chair, in no acute distress.  Cardiovascular: S1, S2, with no murmurs rubs or gallops.  Respiratory: Clear anteriorly with decreased breath sounds in the bases.  Abdomen: Obese, positive bowel sounds, soft, nontender, query mild distention.  Musculoskeletal/extremities: 2+ nonpitting edema of the lower extremities bilaterally.  Skin: Query spider angioma on both cheeks.  Neuropsych: She is slightly anxious and occasionally tearful. She is alert and oriented x3. Cranial nerves II through XII are grossly intact.  Data Reviewed: Basic Metabolic Panel:  Recent Labs Lab 04/22/13 1047 04/23/13 0434  NA 131* 131*  K 2.8* 3.9  CL 83* 88*  CO2 35* 31  GLUCOSE 81 103*  BUN 3* 5*  CREATININE 0.62 0.68  CALCIUM 8.0* 7.4*  MG  --  1.1*   Liver Function Tests:  Recent Labs Lab 04/22/13 1047 04/23/13 0434  AST 46* 39*  ALT 11 10   ALKPHOS 156* 151*  BILITOT 2.1* 1.7*  PROT 6.8 6.1  ALBUMIN 1.9* 1.7*   No results found for this basename: LIPASE, AMYLASE,  in the last 168 hours  Recent Labs Lab 04/22/13 1047  AMMONIA 43   CBC:  Recent Labs Lab 04/22/13 1047 04/23/13 0434  WBC 11.3* 12.7*  NEUTROABS 8.8*  --   HGB 9.3* 8.7*  HCT 28.3* 26.1*  MCV 100.7* 100.8*  PLT 190 179   Cardiac Enzymes:  Recent Labs Lab 04/22/13 1047  TROPONINI <0.30   BNP (last 3 results)  Recent Labs  04/22/13 1047  PROBNP 254.9*   CBG: No results found for this basename: GLUCAP,  in the last 168 hours  No results found for this or any previous visit (from the past 240 hour(s)).   Studies: Dg Chest Portable 1 View  04/22/2013   CLINICAL DATA:  Altered mental status and weakness  EXAM: PORTABLE CHEST - 1 VIEW  COMPARISON:  Portable chest x-ray dated March 19, 2013.  FINDINGS: The lungs are hypoinflated. There is no focal infiltrate. There is partial obscuration of the left hemidiaphragm which is stable. The central pulmonary vascularity is mildly prominent. The cardiac silhouette is top-normal in size. There is no cephalization of the pulmonary vascular pattern. There is no pleural effusion or pneumothorax. There is old deformity of the distal aspect of the right clavicle. The heart size and mediastinal contours are within normal limits. Both  lungs are clear. The visualized skeletal structures are unremarkable.  IMPRESSION: There is bilateral pulmonary hypo inflation. There is no definite evidence of pneumonia nor CHF or other acute cardiopulmonary abnormality.   Electronically Signed   By: David  Martinique   On: 04/22/2013 11:14    Scheduled Meds: . enoxaparin (LOVENOX) injection  40 mg Subcutaneous Q24H  . folic acid  1 mg Oral Daily  . ipratropium-albuterol  3 mL Nebulization Q6H  . levothyroxine  75 mcg Oral QAC breakfast  . pantoprazole  40 mg Oral BID AC  . potassium chloride SA  20 mEq Oral BID  . pregabalin   100 mg Oral TID  . sodium chloride  3 mL Intravenous Q12H  . thiamine  100 mg Oral Daily   Continuous Infusions: . 0.9 % NaCl with KCl 40 mEq / L 75 mL/hr at 04/22/13 1944    Assessment:  Principal Problem:   Generalized weakness Active Problems:   Hypokalemia   Anasarca   Hypoalbuminemia   Mild diastolic dysfunction   Hepatic steatosis   Abnormal LFTs   Hypothyroid   Chronic pain   Macrocytic anemia   Hypotension, unspecified   Esophagitis, erosive   Morbid obesity   1. Generalized weakness. This is likely secondary to overdiuresis/volume depletion coupled with hypokalemia. She is being hydrated. Her potassium is being supplemented.  Hypokalemia. This is likely secondary to Lasix and hypomagnesemia. She is being supplemented and repleted.  Hypomagnesemia. Her magnesium level is 1.1. Will supplement/replete.  Mild hyponatremia. We'll continue gentle IV fluids with normal saline. Continue to hold Lasix.  Hypoalbuminemia. This could be secondary to fatty liver disease or a severe malnutrition or both. We'll consider an albumin infusion, but this will be only a temporary solution.  Anasarca/lower extremity edema bilaterally. The etiology is likely multifactorial including venous stasis as well as hypoalbuminemia. I doubt be compensated diastolic heart failure as she had been on twice a day dosing of Lasix and is relatively volume depleted. Lasix is being held with gentle hydration. There is some decrease in her lower extremity edema per patient and husband.  Hepatic steatosis query cirrhosis. She has a history of hepatitis C positivity, but HCV quantitative was not detected in 03/2013. Further workup in 03/2013 revealed elevated IgG and IgA; normal total iron; ANA was negative; anti-smooth muscle antibody IgG was 21, very weakly positive; moderate chondral antibody was negative. She does have a history of heavy alcohol abuse, but stopped approximately 6 months ago according to her  husband. Alcoholic liver disease could still be a contributing factor for her hypoalbuminemia and elevated bilirubin/LFTs.  Macrocytic anemia. She has chronic anemia. The decrease in her hemoglobin is likely dilutional. Her anemia panel was within normal limits in December, specifically with a normal RBC folate and vitamin B12. She has a history of erosive esophagitis and peptic ulcer disease recently treated. She may have mild underlying GI bleeding coupled with microcytosis from her history of alcohol abuse.  Recent diagnosis of erosive reflux esophagitis. We'll continue Protonix.  Hypothyroidism. We'll continue Synthroid.  Chronic pain. We'll change our oxycodone to every 4 hours instead of every 6 hours when necessary.    Plan: 1. We'll change IV fluids to discontinue potassium. We'll decrease the rate a little. 2. We'll start magnesium sulfate supplementation/repletion with 2 g. 3. Supportive treatment. 4. Check additional laboratory studies in the morning. 4. PT consultation.   Time spent: 40 minutes.    [facility] Pager [phone]. If 7PM-7AM, please  contact night-coverage at [email or web address], password [facility] 04/23/2013, 11:31 AM  LOS: 1 day

## 2013-04-23 NOTE — Progress Notes (Signed)
INITIAL NUTRITION ASSESSMENT  DOCUMENTATION CODES Per approved criteria  -Obesity Unspecified   INTERVENTION: Provided diet education for increased protein intakd [place] yogurt daily  NUTRITION DIAGNOSIS:  Unplanned wt gain related to fluid status as evidenced by general swelling, 36# increase x 30 days.    .   Goal: Pt to meet >/= 90% of their estimated nutrition needs   Monitor:  Protein-energy intake, labs and wt trends  Reason for Assessment: Malnutrition Screen Score = 2  53 y.o. female Patient Active Problem List   Diagnosis Date Noted  . Mild diastolic dysfunction 02/72/5366  . Generalized weakness 04/22/2013  . Hypotension, unspecified 04/22/2013  . Anasarca 04/22/2013  . Hypoalbuminemia 04/22/2013  . Acute esophagitis 03/23/2013  . PUD (peptic ulcer disease) 03/23/2013  . Acute blood loss anemia 03/23/2013  . Nausea and vomiting in adult 03/19/2013  . Hyperbilirubinemia 03/19/2013  . Macrocytic anemia 03/19/2013  . GBS (Guillain Barre syndrome) 03/19/2013  . Hepatitis C 03/19/2013  . UTI (urinary tract infection) 11/02/2012  . Metabolic acidosis 44/06/4740  . Abnormal EKG 11/01/2012  . Anemia 11/01/2012  . Chronic pain 11/01/2012  . Anxiety 11/01/2012  . Tobacco abuse 11/01/2012  . Elevated alkaline phosphatase level 11/01/2012  . Elevated AST (SGOT) 11/01/2012  . Obesity 11/01/2012  . Thyroid disease   . Hypothyroid 04/30/2012  . Folate deficiency 04/30/2012  . ETOH abuse 04/30/2012  . Seizure disorder 04/30/2012  . Hypokalemia 04/28/2012  . Dehydration 04/28/2012  . Hematuria 04/28/2012  . Thrombocytopenia 04/28/2012  . Abnormal LFTs 04/28/2012  . Macrocytosis 04/28/2012  . Hyperglycemia 04/28/2012  . Proteinuria 04/28/2012  . Hypoxia 04/28/2012  . Malar rash 04/28/2012  . Nausea vomiting and diarrhea 04/28/2012  . Abdominal pain 04/28/2012     ASSESSMENT: Pt appetite very good po 100% meals. Severe wt gain due to general swelling? A 36# wt  gain x 30 days. Diet education related to protein intake. Discussed protein food options/alternatives such as greek yogurt. Pt likes meats eats chicken mostly but also consumes beef and pork. According to pt she may discharge within next 24 hr. No physical signs of malnutrition noted.  Height: Ht Readings from Last 1 Encounters:  04/22/13 5\' 4"  (1.626 m)    Weight: Wt Readings from Last 1 Encounters:  04/23/13 234 lb 8 oz (106.369 kg)    Ideal Body Weight: 120# (54.5kg)  % Ideal Body Weight: 195#  Wt Readings from Last 10 Encounters:  04/23/13 234 lb 8 oz (106.369 kg)  03/19/13 198 lb 10.2 oz (90.1 kg)  03/19/13 198 lb 10.2 oz (90.1 kg)  01/17/13 180 lb (81.647 kg)  11/01/12 201 lb 11.5 oz (91.5 kg)  05/01/12 198 lb 6.6 oz (90 kg)  04/25/12 200 lb (90.719 kg)  01/26/12 150 lb (68.04 kg)  11/10/11 168 lb (76.204 kg)  07/30/11 170 lb (77.111 kg)    Usual Body Weight: 180-198#  % Usual Body Weight: 118%  BMI:  Body mass index is 40.23 kg/(m^2). likely skewed due to general  swelling  Estimated Nutritional Needs: Kcal: 1500-1700 Protein: 80-90 gr Fluid:  1.5-1.7 liters daily  Skin: intact  Diet Order: Cardiac (po's 100%)  EDUCATION NEEDS: -Education needs addressed   Intake/Output Summary (Last 24 hours) at 04/23/13 1101 Last data filed at 04/23/13 1005  Gross per 24 hour  Intake   1253 ml  Output    850 ml  Net    403 ml    Last BM: 04/22/13   Labs:   Recent  Labs Lab 04/22/13 1047 04/23/13 0434  NA 131* 131*  K 2.8* 3.9  CL 83* 88*  CO2 35* 31  BUN 3* 5*  CREATININE 0.62 0.68  CALCIUM 8.0* 7.4*  MG  --  1.1*  GLUCOSE 81 103*    CBG (last 3)  No results found for this basename: GLUCAP,  in the last 72 hours  Scheduled Meds: . enoxaparin (LOVENOX) injection  40 mg Subcutaneous Q24H  . folic acid  1 mg Oral Daily  . ipratropium-albuterol  3 mL Nebulization Q6H  . levothyroxine  75 mcg Oral QAC breakfast  . pantoprazole  40 mg Oral BID AC  .  potassium chloride SA  20 mEq Oral BID  . pregabalin  100 mg Oral TID  . sodium chloride  3 mL Intravenous Q12H  . thiamine  100 mg Oral Daily    Continuous Infusions: . 0.9 % NaCl with KCl 40 mEq / L 75 mL/hr at 04/22/13 1944    Past Medical History  Diagnosis Date  . Guillain-Barre   . Blind   . Neuropathy   . Thyroid disease   . Back pain   . Arthritis   . Seizures   . UTI (lower urinary tract infection)   . Anxiety   . Depression   . DDD (degenerative disc disease), lumbar   . Hepatitis C antibody test positive     HCV RNA is negative 03/2013  . Mild diastolic dysfunction 10/11/5282    Past Surgical History  Procedure Laterality Date  . Cholecystectomy    . Tubal ligation    . Femur fracture surgery    . Ankle surgery    . Back surgery    . Esophagogastroduodenoscopy N/A 03/21/2013    Procedure: ESOPHAGOGASTRODUODENOSCOPY (EGD);  Surgeon: Daneil Dolin, MD;  Location: [facility];  Service: Endoscopy;  Laterality: N/A;    Colman Cater MS,RD,CSG,LDN Office: [phone] Pager: [phone]

## 2013-04-24 ENCOUNTER — Encounter (HOSPITAL_COMMUNITY): Payer: Self-pay | Admitting: Internal Medicine

## 2013-04-24 DIAGNOSIS — E861 Hypovolemia: Secondary | ICD-10-CM

## 2013-04-24 DIAGNOSIS — E039 Hypothyroidism, unspecified: Secondary | ICD-10-CM

## 2013-04-24 DIAGNOSIS — R600 Localized edema: Secondary | ICD-10-CM | POA: Diagnosis present

## 2013-04-24 LAB — CBC
HCT: 26.3 % — ABNORMAL LOW (ref 36.0–46.0)
Hemoglobin: 8.6 g/dL — ABNORMAL LOW (ref 12.0–15.0)
MCH: 33.2 pg (ref 26.0–34.0)
MCHC: 32.7 g/dL (ref 30.0–36.0)
MCV: 101.5 fL — ABNORMAL HIGH (ref 78.0–100.0)
PLATELETS: 181 10*3/uL (ref 150–400)
RBC: 2.59 MIL/uL — ABNORMAL LOW (ref 3.87–5.11)
RDW: 15.3 % (ref 11.5–15.5)
WBC: 10.4 10*3/uL (ref 4.0–10.5)

## 2013-04-24 LAB — BASIC METABOLIC PANEL
BUN: 5 mg/dL — ABNORMAL LOW (ref 6–23)
CALCIUM: 8.3 mg/dL — AB (ref 8.4–10.5)
CO2: 29 mEq/L (ref 19–32)
Chloride: 93 mEq/L — ABNORMAL LOW (ref 96–112)
Creatinine, Ser: 0.57 mg/dL (ref 0.50–1.10)
GLUCOSE: 80 mg/dL (ref 70–99)
POTASSIUM: 4.3 meq/L (ref 3.7–5.3)
SODIUM: 132 meq/L — AB (ref 137–147)

## 2013-04-24 LAB — HEPATIC FUNCTION PANEL
ALBUMIN: 1.8 g/dL — AB (ref 3.5–5.2)
ALK PHOS: 155 U/L — AB (ref 39–117)
ALT: 11 U/L (ref 0–35)
AST: 43 U/L — AB (ref 0–37)
BILIRUBIN DIRECT: 1.1 mg/dL — AB (ref 0.0–0.3)
BILIRUBIN TOTAL: 1.9 mg/dL — AB (ref 0.3–1.2)
Indirect Bilirubin: 0.8 mg/dL (ref 0.3–0.9)
Total Protein: 6.4 g/dL (ref 6.0–8.3)

## 2013-04-24 MED ORDER — POTASSIUM CHLORIDE CRYS ER 20 MEQ PO TBCR: 20.0000 meq | EXTENDED_RELEASE_TABLET | Freq: Every day | ORAL | Status: DC

## 2013-04-24 MED ORDER — FUROSEMIDE 40 MG PO TABS: 20.0000 mg | ORAL_TABLET | Freq: Every day | ORAL | Status: DC

## 2013-04-24 MED ORDER — OXYCODONE HCL 5 MG PO TABS: 5.0000 mg | ORAL_TABLET | ORAL | Status: DC | PRN

## 2013-04-24 MED ORDER — LACTULOSE 10 GM/15ML PO SOLN
10.0000 g | Freq: Every day | ORAL | Status: DC
  Administered 2013-04-24: 10 g via ORAL
  Filled 2013-04-24: qty 30

## 2013-04-24 MED ORDER — CYANOCOBALAMIN 1000 MCG/ML IJ SOLN
1000.0000 ug | Freq: Once | INTRAMUSCULAR | Status: AC
  Administered 2013-04-24: 1000 ug via INTRAMUSCULAR
  Filled 2013-04-24: qty 1

## 2013-04-24 NOTE — Progress Notes (Signed)
Discharge instructions and prescriptions given, verbalized understanding, out in stable condition with staff via w/c. 

## 2013-04-24 NOTE — Discharge Summary (Signed)
Physician Discharge Summary  [name] KDT:[id number] DOB: 1960-11-08 DOA: 04/22/2013  PCP: Celedonio Savage, MD  Admit date: 04/22/2013 Discharge date: 04/24/2013  Time spent: Greater than 30 minutes  Recommendations for Outpatient Follow-up:  1. The patient was instructed to avoid dehydration and to decrease Lasix from 80 mg daily to 20 mg daily. Her serum sodium and potassium will need to be reassessed at her followup appointment.  Discharge Diagnoses:  1. Hypovolemia/dehydration and hypotension resulting in generalized weakness. Resolved. 2. Hyponatremia secondary to hypovolemia. 3. Hypokalemia secondary to diuretic therapy. 4. Hypomagnesemia. 5. Lower extremity edema, secondary to hypoalbuminemia. 6. Hypoalbuminemia secondary to severe protein calorie malnutrition and possibly fatty liver disease. 7. Elevated LFTs and hyperbilirubinemia, mild, likely secondary to fatty liver disease/hepatic steatosis. There is a suspicion of cirrhosis. Previous workup for cirrhosis in December 2014 was essentially negative. 8. Hepatitis C antibody positive, but HCV RNA negative, 99/8338. 9. Mild diastolic dysfunction per 2-D echocardiogram. 10. Chronic macrocytic anemia. Anemia panel in 03/2013 revealed a normal RBC folate and normal vitamin B12 level. 11. Recent diagnosis of erosive esophagitis. 12. Hypothyroidism. 13. Morbid obesity. 14. Chronic pain.  Discharge Condition: Improved.  Diet recommendation: Heart healthy.  Filed Weights   04/23/13 1053 04/24/13 0428  Weight: 106.369 kg (234 lb 8 oz) 108.001 kg (238 lb 1.6 oz)    History of present illness:   [name] is a 53 y.o. female with multiple medical problems who was recently in the hospital in 12/14 and was treated for severe erosive esophagitis. Patient reported that since her last discharge, she had been increasingly weak. She had difficulty walking. Her symptoms have been particularly worse over the past week. She also reported  cramps in her hands bilaterally. She had difficulty with lower extremity edema for the past one to 2 months. She was recently started on and had been been taking Lasix 40 mg by mouth twice a day without significant improvement in her edema. She denied any significant chest pain, shortness of breath, fever, change in cough, dysuria, diarrhea. She does admit to chronic back and leg pain. She was brought to the emergency room for evaluation where she was noted to be significantly hypokalemic. The patient was admitted for further evaluation and treatments.   Hospital Course:   The patient's generalized weakness was thought to be secondary to volume depletion from overdiuresis from Lasix which was recently started by her primary care physician. During hospitalization, Lasix was withheld. She was started on gentle hydration with IV fluids with normal saline and potassium chloride added. She was repleted with potassium chloride in the IV fluids and orally as it was believed that her hypokalemia was secondary to Lasix. Nevertheless, her magnesium level was assessed and was found to be low. She was treated with 2 g of magnesium sulfate. The patient was noted to have increasing lower extremity edema and mild anasarca at home. This was likely secondary to hypoalbuminemia with possible contribution from chronic venous insufficiency of her lower extremities. Her 2-D echocardiogram in January 2014 revealed preserved left ventricular systolic function and mild diastolic dysfunction which was not a contributing factor. Nevertheless, Lasix was withheld while she was being hydrated. TED hose were applied to help with lower extremity edema. Following 48 hours of hydration, the extent of her lower extremity edema/anasarca decreased significantly. She improved symptomatically and clinically. She had no complaints of leg cramping, generalized weakness, or malaise. Her serum sodium improved to 132. Her potassium improved to 4.3. Her  albumin remained  low. The registered dietitian was consulted and recommended [place] and supplemental Ensure or Boost. At the time of discharge, Lasix was resumed but at 20 mg daily, decreased from 80 mg daily.   She has hepatic steatosis query cirrhosis. She has a history of hepatitis C positivity, but HCV quantitative was not detected in 03/2013. Further workup in 03/2013 revealed elevated IgG and IgA; normal total iron; ANA was negative; anti-smooth muscle antibody IgG was 21, very weakly positive; moderate chondral antibody was negative. She does have a history of heavy alcohol abuse, but stopped approximately 6 months ago according to her husband. Alcoholic liver disease could still be a contributing factor for her hypoalbuminemia and elevated bilirubin/LFTs.   Macrocytic anemia. She has chronic anemia. Her hemoglobin was 9.3 on admission, but drifted downward to 8.6. The decrease in her hemoglobin was likely dilutional. Her anemia panel was within normal limits in December 2014, specifically with a normal RBC folate and vitamin B12. She has a history of erosive esophagitis and peptic ulcer disease recently treated. Her microcytosis may be secondary to her history of alcohol abuse.  Recent diagnosis of erosive reflux esophagitis. Protonix was continued.   Procedures: None   Consultations: None   Discharge Exam: Filed Vitals:   04/24/13 0428  BP: 105/62  Pulse: 102  Temp: 98.4 F (36.9 C)  Resp: 18    General: Obese 53 year old sitting up in bed, in no acute distress. Overall, she looks significantly improved.  Cardiovascular: S1, S2, with no murmurs or gallops.  Respiratory: Clear to auscultation bilaterally. Extremities: Significant decrease in lower extremity edema to 1+/trace.   Discharge Instructions  Discharge Orders   Future Appointments Provider Department Dept Phone   06/27/2013 10:00 AM [facility] [phone]   Future  Orders Complete By Expires   Diet - low sodium heart healthy  As directed    Discharge instructions  As directed    Comments:     Lasix (furosemide) dose was reduced to half a tablet once daily. Potassium dosing has been reduced to one tablet once daily. Eat protein rich yogurt and supplemental Ensure.   Increase activity slowly  As directed        Medication List         cyanocobalamin 1000 MCG/ML injection  Commonly known as:  (VITAMIN B-12)  Inject 1,000 mcg into the muscle every 30 (thirty) days. Sunday     folic acid 1 MG tablet  Commonly known as:  FOLVITE  Take 1 tablet (1 mg total) by mouth daily.     furosemide 40 MG tablet  Commonly known as:  LASIX  Take 0.5 tablets (20 mg total) by mouth daily.     levothyroxine 75 MCG tablet  Commonly known as:  SYNTHROID, LEVOTHROID  Take 1 tablet (75 mcg total) by mouth every morning.     ondansetron 4 MG tablet  Commonly known as:  ZOFRAN  Take 1 tablet (4 mg total) by mouth every 6 (six) hours as needed for nausea.     oxyCODONE 5 MG immediate release tablet  Commonly known as:  Oxy IR/ROXICODONE  Take 1 tablet (5 mg total) by mouth every 4 (four) hours as needed for moderate pain or severe pain.     pantoprazole 40 MG tablet  Commonly known as:  PROTONIX  Take 1 tablet (40 mg total) by mouth 2 (two) times daily before a meal.     potassium chloride SA 20 MEQ tablet  Commonly  known as:  K-DUR,KLOR-CON  Take 1 tablet (20 mEq total) by mouth daily.     pregabalin 100 MG capsule  Commonly known as:  LYRICA  Take 100 mg by mouth 3 (three) times daily.     thiamine 100 MG tablet  Take 1 tablet (100 mg total) by mouth daily.       Allergies  Allergen Reactions  . Neurontin [Gabapentin] Anaphylaxis  . Acetaminophen Itching and Nausea And Vomiting  . Eggs Or Egg-Derived Products Nausea And Vomiting  . Codeine Itching and Nausea And Vomiting    Patient is able to take morphine, demerol and fentanyl with out any type  of side effects       Follow-up Information   Follow up with Celedonio Savage, MD In 1 week.   Specialty:  Family Medicine   Contact information:   [facility] [place] [address] [phone]       Follow up with Manus Rudd, MD. (Followup as scheduled.)    Specialty:  Gastroenterology   Contact information:   [place] [address] [place] [address] [phone]        The results of significant diagnostics from this hospitalization (including imaging, microbiology, ancillary and laboratory) are listed below for reference.    Significant Diagnostic Studies: Dg Chest Portable 1 View  04/22/2013   CLINICAL DATA:  Altered mental status and weakness  EXAM: PORTABLE CHEST - 1 VIEW  COMPARISON:  Portable chest x-ray dated March 19, 2013.  FINDINGS: The lungs are hypoinflated. There is no focal infiltrate. There is partial obscuration of the left hemidiaphragm which is stable. The central pulmonary vascularity is mildly prominent. The cardiac silhouette is top-normal in size. There is no cephalization of the pulmonary vascular pattern. There is no pleural effusion or pneumothorax. There is old deformity of the distal aspect of the right clavicle. The heart size and mediastinal contours are within normal limits. Both lungs are clear. The visualized skeletal structures are unremarkable.  IMPRESSION: There is bilateral pulmonary hypo inflation. There is no definite evidence of pneumonia nor CHF or other acute cardiopulmonary abnormality.   Electronically Signed   By: David  [place]   On: 04/22/2013 11:14    Microbiology: No results found for this or any previous visit (from the past 240 hour(s)).   Labs: Basic Metabolic Panel:  Recent Labs Lab 04/22/13 1047 04/23/13 0434 04/24/13 0612  NA 131* 131* 132*  K 2.8* 3.9 4.3  CL 83* 88* 93*  CO2 35* 31 29  GLUCOSE 81 103* 80  BUN 3* 5* 5*  CREATININE 0.62 0.68 0.57  CALCIUM 8.0*  7.4* 8.3*  MG  --  1.1*  --    Liver Function Tests:  Recent Labs Lab 04/22/13 1047 04/23/13 0434 04/24/13 0612  AST 46* 39* 43*  ALT 11 10 11   ALKPHOS 156* 151* 155*  BILITOT 2.1* 1.7* 1.9*  PROT 6.8 6.1 6.4  ALBUMIN 1.9* 1.7* 1.8*   No results found for this basename: LIPASE, AMYLASE,  in the last 168 hours  Recent Labs Lab 04/22/13 1047  AMMONIA 43   CBC:  Recent Labs Lab 04/22/13 1047 04/23/13 0434 04/24/13 0612  WBC 11.3* 12.7* 10.4  NEUTROABS 8.8*  --   --   HGB 9.3* 8.7* 8.6*  HCT 28.3* 26.1* 26.3*  MCV 100.7* 100.8* 101.5*  PLT 190 179 181   Cardiac Enzymes:  Recent Labs Lab 04/22/13 1047  TROPONINI <0.30  BNP: BNP (last 3 results)  Recent Labs  04/22/13 1047  PROBNP 254.9*   CBG: No results found for this basename: GLUCAP,  in the last 168 hours     Signed:  Luismario Coston  [facility] 04/24/2013, 10:17 AM

## 2013-04-26 NOTE — Progress Notes (Signed)
UR chart review completed.  

## 2013-06-06 ENCOUNTER — Emergency Department (HOSPITAL_COMMUNITY): Payer: Medicare Other

## 2013-06-06 ENCOUNTER — Inpatient Hospital Stay (HOSPITAL_COMMUNITY)
Admission: EM | Admit: 2013-06-06 | Discharge: 2013-06-14 | DRG: 189 | Disposition: A | Discharge status: Home Health | Payer: Medicare Other | Attending: Family Medicine | Admitting: Family Medicine

## 2013-06-06 ENCOUNTER — Encounter (HOSPITAL_COMMUNITY): Payer: Self-pay | Admitting: Emergency Medicine

## 2013-06-06 DIAGNOSIS — K7460 Unspecified cirrhosis of liver: Secondary | ICD-10-CM | POA: Diagnosis present

## 2013-06-06 DIAGNOSIS — M51379 Other intervertebral disc degeneration, lumbosacral region without mention of lumbar back pain or lower extremity pain: Secondary | ICD-10-CM | POA: Diagnosis present

## 2013-06-06 DIAGNOSIS — E079 Disorder of thyroid, unspecified: Secondary | ICD-10-CM

## 2013-06-06 DIAGNOSIS — E860 Dehydration: Secondary | ICD-10-CM

## 2013-06-06 DIAGNOSIS — D62 Acute posthemorrhagic anemia: Secondary | ICD-10-CM

## 2013-06-06 DIAGNOSIS — I959 Hypotension, unspecified: Secondary | ICD-10-CM | POA: Diagnosis present

## 2013-06-06 DIAGNOSIS — E876 Hypokalemia: Secondary | ICD-10-CM | POA: Diagnosis present

## 2013-06-06 DIAGNOSIS — R809 Proteinuria, unspecified: Secondary | ICD-10-CM

## 2013-06-06 DIAGNOSIS — T502X5A Adverse effect of carbonic-anhydrase inhibitors, benzothiadiazides and other diuretics, initial encounter: Secondary | ICD-10-CM | POA: Diagnosis present

## 2013-06-06 DIAGNOSIS — G610 Guillain-Barre syndrome: Secondary | ICD-10-CM

## 2013-06-06 DIAGNOSIS — E861 Hypovolemia: Secondary | ICD-10-CM

## 2013-06-06 DIAGNOSIS — K209 Esophagitis, unspecified without bleeding: Secondary | ICD-10-CM

## 2013-06-06 DIAGNOSIS — I5030 Unspecified diastolic (congestive) heart failure: Secondary | ICD-10-CM | POA: Diagnosis present

## 2013-06-06 DIAGNOSIS — R112 Nausea with vomiting, unspecified: Secondary | ICD-10-CM | POA: Diagnosis present

## 2013-06-06 DIAGNOSIS — R109 Unspecified abdominal pain: Secondary | ICD-10-CM

## 2013-06-06 DIAGNOSIS — M129 Arthropathy, unspecified: Secondary | ICD-10-CM | POA: Diagnosis present

## 2013-06-06 DIAGNOSIS — D539 Nutritional anemia, unspecified: Secondary | ICD-10-CM

## 2013-06-06 DIAGNOSIS — N390 Urinary tract infection, site not specified: Secondary | ICD-10-CM

## 2013-06-06 DIAGNOSIS — E039 Hypothyroidism, unspecified: Secondary | ICD-10-CM | POA: Diagnosis present

## 2013-06-06 DIAGNOSIS — B1920 Unspecified viral hepatitis C without hepatic coma: Secondary | ICD-10-CM | POA: Diagnosis present

## 2013-06-06 DIAGNOSIS — F3289 Other specified depressive episodes: Secondary | ICD-10-CM | POA: Diagnosis present

## 2013-06-06 DIAGNOSIS — R601 Generalized edema: Secondary | ICD-10-CM

## 2013-06-06 DIAGNOSIS — K7010 Alcoholic hepatitis without ascites: Secondary | ICD-10-CM | POA: Diagnosis present

## 2013-06-06 DIAGNOSIS — R748 Abnormal levels of other serum enzymes: Secondary | ICD-10-CM

## 2013-06-06 DIAGNOSIS — H543 Unqualified visual loss, both eyes: Secondary | ICD-10-CM | POA: Diagnosis present

## 2013-06-06 DIAGNOSIS — Z91199 Patient's noncompliance with other medical treatment and regimen due to unspecified reason: Secondary | ICD-10-CM

## 2013-06-06 DIAGNOSIS — E872 Acidosis, unspecified: Secondary | ICD-10-CM

## 2013-06-06 DIAGNOSIS — Z8711 Personal history of peptic ulcer disease: Secondary | ICD-10-CM

## 2013-06-06 DIAGNOSIS — K2210 Ulcer of esophagus without bleeding: Secondary | ICD-10-CM | POA: Diagnosis present

## 2013-06-06 DIAGNOSIS — K921 Melena: Secondary | ICD-10-CM | POA: Diagnosis present

## 2013-06-06 DIAGNOSIS — J9601 Acute respiratory failure with hypoxia: Secondary | ICD-10-CM | POA: Diagnosis present

## 2013-06-06 DIAGNOSIS — Z6832 Body mass index (BMI) 32.0-32.9, adult: Secondary | ICD-10-CM

## 2013-06-06 DIAGNOSIS — K760 Fatty (change of) liver, not elsewhere classified: Secondary | ICD-10-CM | POA: Diagnosis present

## 2013-06-06 DIAGNOSIS — K279 Peptic ulcer, site unspecified, unspecified as acute or chronic, without hemorrhage or perforation: Secondary | ICD-10-CM | POA: Diagnosis present

## 2013-06-06 DIAGNOSIS — E43 Unspecified severe protein-calorie malnutrition: Secondary | ICD-10-CM | POA: Diagnosis present

## 2013-06-06 DIAGNOSIS — R739 Hyperglycemia, unspecified: Secondary | ICD-10-CM

## 2013-06-06 DIAGNOSIS — J9600 Acute respiratory failure, unspecified whether with hypoxia or hypercapnia: Principal | ICD-10-CM

## 2013-06-06 DIAGNOSIS — D7589 Other specified diseases of blood and blood-forming organs: Secondary | ICD-10-CM

## 2013-06-06 DIAGNOSIS — R319 Hematuria, unspecified: Secondary | ICD-10-CM

## 2013-06-06 DIAGNOSIS — F1020 Alcohol dependence, uncomplicated: Secondary | ICD-10-CM | POA: Diagnosis present

## 2013-06-06 DIAGNOSIS — F329 Major depressive disorder, single episode, unspecified: Secondary | ICD-10-CM | POA: Diagnosis present

## 2013-06-06 DIAGNOSIS — R51 Headache: Secondary | ICD-10-CM | POA: Diagnosis present

## 2013-06-06 DIAGNOSIS — F419 Anxiety disorder, unspecified: Secondary | ICD-10-CM

## 2013-06-06 DIAGNOSIS — K5900 Constipation, unspecified: Secondary | ICD-10-CM | POA: Diagnosis present

## 2013-06-06 DIAGNOSIS — G8929 Other chronic pain: Secondary | ICD-10-CM

## 2013-06-06 DIAGNOSIS — D638 Anemia in other chronic diseases classified elsewhere: Secondary | ICD-10-CM | POA: Diagnosis present

## 2013-06-06 DIAGNOSIS — R7989 Other specified abnormal findings of blood chemistry: Secondary | ICD-10-CM

## 2013-06-06 DIAGNOSIS — R197 Diarrhea, unspecified: Secondary | ICD-10-CM

## 2013-06-06 DIAGNOSIS — R740 Nonspecific elevation of levels of transaminase and lactic acid dehydrogenase [LDH]: Secondary | ICD-10-CM

## 2013-06-06 DIAGNOSIS — R609 Edema, unspecified: Secondary | ICD-10-CM

## 2013-06-06 DIAGNOSIS — R21 Rash and other nonspecific skin eruption: Secondary | ICD-10-CM

## 2013-06-06 DIAGNOSIS — I509 Heart failure, unspecified: Secondary | ICD-10-CM | POA: Diagnosis present

## 2013-06-06 DIAGNOSIS — D759 Disease of blood and blood-forming organs, unspecified: Secondary | ICD-10-CM | POA: Diagnosis present

## 2013-06-06 DIAGNOSIS — R600 Localized edema: Secondary | ICD-10-CM

## 2013-06-06 DIAGNOSIS — Z720 Tobacco use: Secondary | ICD-10-CM

## 2013-06-06 DIAGNOSIS — Z9119 Patient's noncompliance with other medical treatment and regimen: Secondary | ICD-10-CM

## 2013-06-06 DIAGNOSIS — F411 Generalized anxiety disorder: Secondary | ICD-10-CM | POA: Diagnosis present

## 2013-06-06 DIAGNOSIS — E669 Obesity, unspecified: Secondary | ICD-10-CM

## 2013-06-06 DIAGNOSIS — I519 Heart disease, unspecified: Secondary | ICD-10-CM

## 2013-06-06 DIAGNOSIS — E538 Deficiency of other specified B group vitamins: Secondary | ICD-10-CM

## 2013-06-06 DIAGNOSIS — K769 Liver disease, unspecified: Secondary | ICD-10-CM | POA: Diagnosis present

## 2013-06-06 DIAGNOSIS — R531 Weakness: Secondary | ICD-10-CM

## 2013-06-06 DIAGNOSIS — D696 Thrombocytopenia, unspecified: Secondary | ICD-10-CM

## 2013-06-06 DIAGNOSIS — G40909 Epilepsy, unspecified, not intractable, without status epilepticus: Secondary | ICD-10-CM | POA: Diagnosis present

## 2013-06-06 DIAGNOSIS — E8809 Other disorders of plasma-protein metabolism, not elsewhere classified: Secondary | ICD-10-CM

## 2013-06-06 DIAGNOSIS — R945 Abnormal results of liver function studies: Secondary | ICD-10-CM

## 2013-06-06 DIAGNOSIS — M5137 Other intervertebral disc degeneration, lumbosacral region: Secondary | ICD-10-CM | POA: Diagnosis present

## 2013-06-06 DIAGNOSIS — R188 Other ascites: Secondary | ICD-10-CM | POA: Diagnosis present

## 2013-06-06 DIAGNOSIS — F1010 Alcohol abuse, uncomplicated: Secondary | ICD-10-CM | POA: Diagnosis present

## 2013-06-06 DIAGNOSIS — Z79899 Other long term (current) drug therapy: Secondary | ICD-10-CM

## 2013-06-06 DIAGNOSIS — F17200 Nicotine dependence, unspecified, uncomplicated: Secondary | ICD-10-CM | POA: Diagnosis present

## 2013-06-06 DIAGNOSIS — R9431 Abnormal electrocardiogram [ECG] [EKG]: Secondary | ICD-10-CM

## 2013-06-06 DIAGNOSIS — H548 Legal blindness, as defined in USA: Secondary | ICD-10-CM | POA: Diagnosis present

## 2013-06-06 DIAGNOSIS — R0902 Hypoxemia: Secondary | ICD-10-CM

## 2013-06-06 DIAGNOSIS — R7401 Elevation of levels of liver transaminase levels: Secondary | ICD-10-CM

## 2013-06-06 LAB — CBC WITH DIFFERENTIAL/PLATELET
Basophils Absolute: 0 10*3/uL (ref 0.0–0.1)
Basophils Relative: 0 % (ref 0–1)
EOS PCT: 0 % (ref 0–5)
Eosinophils Absolute: 0 10*3/uL (ref 0.0–0.7)
HCT: 33.8 % — ABNORMAL LOW (ref 36.0–46.0)
HEMOGLOBIN: 11.8 g/dL — AB (ref 12.0–15.0)
LYMPHS ABS: 1.6 10*3/uL (ref 0.7–4.0)
Lymphocytes Relative: 15 % (ref 12–46)
MCH: 32.3 pg (ref 26.0–34.0)
MCHC: 34.9 g/dL (ref 30.0–36.0)
MCV: 92.6 fL (ref 78.0–100.0)
MONO ABS: 0.9 10*3/uL (ref 0.1–1.0)
MONOS PCT: 9 % (ref 3–12)
NEUTROS PCT: 76 % (ref 43–77)
Neutro Abs: 8 10*3/uL — ABNORMAL HIGH (ref 1.7–7.7)
Platelets: 232 10*3/uL (ref 150–400)
RBC: 3.65 MIL/uL — AB (ref 3.87–5.11)
RDW: 15.4 % (ref 11.5–15.5)
WBC: 10.6 10*3/uL — ABNORMAL HIGH (ref 4.0–10.5)

## 2013-06-06 LAB — COMPREHENSIVE METABOLIC PANEL
ALBUMIN: 1.9 g/dL — AB (ref 3.5–5.2)
ALK PHOS: 106 U/L (ref 39–117)
ALT: 10 U/L (ref 0–35)
AST: 28 U/L (ref 0–37)
BUN: 4 mg/dL — ABNORMAL LOW (ref 6–23)
CALCIUM: 8.3 mg/dL — AB (ref 8.4–10.5)
CO2: 34 mEq/L — ABNORMAL HIGH (ref 19–32)
CREATININE: 0.52 mg/dL (ref 0.50–1.10)
Chloride: 94 mEq/L — ABNORMAL LOW (ref 96–112)
GFR calc non Af Amer: >90 mL/min (ref >90)
GLUCOSE: 96 mg/dL (ref 70–99)
Potassium: 4.2 mEq/L (ref 3.7–5.3)
Sodium: 134 mEq/L — ABNORMAL LOW (ref 137–147)
Total Bilirubin: 1.4 mg/dL — ABNORMAL HIGH (ref 0.3–1.2)
Total Protein: 6.9 g/dL (ref 6.0–8.3)

## 2013-06-06 LAB — PROTIME-INR
INR: 1.23 (ref 0.00–1.49)
PROTHROMBIN TIME: 15.2 s (ref 11.6–15.2)

## 2013-06-06 LAB — I-STAT CG4 LACTIC ACID, ED: Lactic Acid, Venous: 2.07 mmol/L (ref 0.5–2.2)

## 2013-06-06 MED ORDER — FUROSEMIDE 10 MG/ML IJ SOLN
80.0000 mg | Freq: Once | INTRAMUSCULAR | Status: AC
  Administered 2013-06-06: 80 mg via INTRAVENOUS
  Filled 2013-06-06: qty 8

## 2013-06-06 MED ORDER — MORPHINE SULFATE 4 MG/ML IJ SOLN
4.0000 mg | Freq: Once | INTRAMUSCULAR | Status: AC
  Administered 2013-06-06: 4 mg via INTRAVENOUS
  Filled 2013-06-06: qty 1

## 2013-06-06 MED ORDER — ONDANSETRON HCL 4 MG/2ML IJ SOLN
4.0000 mg | Freq: Once | INTRAMUSCULAR | Status: AC
  Administered 2013-06-06: 4 mg via INTRAVENOUS
  Filled 2013-06-06: qty 2

## 2013-06-06 MED ORDER — HYDROMORPHONE HCL PF 1 MG/ML IJ SOLN
1.0000 mg | Freq: Once | INTRAMUSCULAR | Status: AC
  Administered 2013-06-06: 1 mg via INTRAVENOUS
  Filled 2013-06-06: qty 1

## 2013-06-06 MED ORDER — IOHEXOL 300 MG/ML  SOLN
50.0000 mL | Freq: Once | INTRAMUSCULAR | Status: AC | PRN
  Administered 2013-06-06: 50 mL via ORAL

## 2013-06-06 MED ORDER — IOHEXOL 300 MG/ML  SOLN
100.0000 mL | Freq: Once | INTRAMUSCULAR | Status: AC | PRN
  Administered 2013-06-06: 100 mL via INTRAVENOUS

## 2013-06-06 NOTE — ED Notes (Signed)
Pt complain of n/v and constipation for six days

## 2013-06-06 NOTE — ED Notes (Signed)
O2 applied at @l /m via [place]

## 2013-06-06 NOTE — ED Provider Notes (Signed)
CSN: [id number]     Arrival date & time 06/06/13  1755 History   First MD Initiated Contact with Patient 06/06/13 1810     Chief Complaint  Patient presents with  . Abdominal Pain     Patient is a 53 y.o. female presenting with abdominal pain. The history is provided by the patient.  Abdominal Pain Pain location:  Generalized Pain quality: bloating and fullness   Pain radiates to:  Does not radiate Pain severity:  Severe Onset quality:  Gradual Duration: over a week. Timing:  Constant Progression:  Worsening Chronicity:  New Relieved by:  Position changes Worsened by:  Palpation Associated symptoms: constipation, cough and vomiting   Associated symptoms: no chest pain, no dysuria and no fever   pt reports for over past week she has had increased abd pain, nausea and constipation.  She also reports some vomiting.  She reports her abdomen is distended and very tender.  She reports this distention is new.    She also reports increased cough but no CP is reported.  She does report some shortness of breath  Past Medical History  Diagnosis Date  . Guillain-Barre   . Blind   . Neuropathy   . Thyroid disease   . Back pain   . Arthritis   . Seizures   . UTI (lower urinary tract infection)   . Anxiety   . Depression   . DDD (degenerative disc disease), lumbar   . Hepatitis C antibody test positive     HCV RNA is negative 03/2013  . Mild diastolic dysfunction 08/05/5359  . Esophagitis, erosive 03/2014  . Hepatic steatosis 04/23/2013  . Hypomagnesemia 04/23/2013   Past Surgical History  Procedure Laterality Date  . Cholecystectomy    . Tubal ligation    . Femur fracture surgery    . Ankle surgery    . Back surgery    . Esophagogastroduodenoscopy N/A 03/21/2013    Procedure: ESOPHAGOGASTRODUODENOSCOPY (EGD);  Surgeon: Daneil Dolin, MD;  Location: [facility];  Service: Endoscopy;  Laterality: N/A;   No family history on file. History  Substance Use Topics  . Smoking  status: Current Every Day Smoker -- 0.50 packs/day    Types: Cigarettes  . Smokeless tobacco: Not on file  . Alcohol Use: No   OB History   Grav Para Term Preterm Abortions TAB SAB Ect Mult Living                 Review of Systems  Constitutional: Negative for fever.  Respiratory: Positive for cough.   Cardiovascular: Negative for chest pain.  Gastrointestinal: Positive for vomiting, abdominal pain and constipation.  Genitourinary: Negative for dysuria.  All other systems reviewed and are negative.      Allergies  Neurontin; Acetaminophen; Eggs or egg-derived products; and Codeine  Home Medications   Current Outpatient Rx  Name  Route  Sig  Dispense  Refill  . cyanocobalamin (,VITAMIN B-12,) 1000 MCG/ML injection   Intramuscular   Inject 1,000 mcg into the muscle every 30 (thirty) days. Sunday         . cyclobenzaprine (FLEXERIL) 10 MG tablet   Oral   Take 10 mg by mouth 3 (three) times daily.         . folic acid (FOLVITE) 1 MG tablet   Oral   Take 1 tablet (1 mg total) by mouth daily.         . furosemide (LASIX) 40 MG tablet   Oral  Take 40 mg by mouth 2 (two) times daily.         Marland Kitchen levothyroxine (SYNTHROID, LEVOTHROID) 50 MCG tablet   Oral   Take 50 mcg by mouth daily before breakfast.         . ondansetron (ZOFRAN) 4 MG tablet   Oral   Take 1 tablet (4 mg total) by mouth every 6 (six) hours as needed for nausea.   20 tablet   0   . oxyCODONE (OXY IR/ROXICODONE) 5 MG immediate release tablet   Oral   Take 5 mg by mouth 3 (three) times daily.         . OxyCODONE (OXYCONTIN) 10 mg T12A 12 hr tablet   Oral   Take 10 mg by mouth 3 (three) times daily.         . pantoprazole (PROTONIX) 40 MG tablet   Oral   Take 1 tablet (40 mg total) by mouth 2 (two) times daily before a meal.   60 tablet   0   . potassium chloride SA (K-DUR,KLOR-CON) 20 MEQ tablet   Oral   Take 1 tablet (20 mEq total) by mouth daily.         . pregabalin  (LYRICA) 100 MG capsule   Oral   Take 100-200 mg by mouth 2 (two) times daily. 1 in the morning and 2 at bedtime         . pregabalin (LYRICA) 50 MG capsule   Oral   Take 50-100 mg by mouth 2 (two) times daily. 1 in the morning and 2 at bedtime         . thiamine 100 MG tablet   Oral   Take 1 tablet (100 mg total) by mouth daily.   30 tablet   0    BP 114/76  Pulse 101  Temp(Src) 98.9 F (37.2 C) (Oral)  Resp 18  Ht 5\' 4"  (1.626 m)  Wt 180 lb (81.647 kg)  BMI 30.88 kg/m2  SpO2 98% Physical Exam CONSTITUTIONAL: ill appearing HEAD: Normocephalic/atraumatic EYES: EOMI/PERRL ENMT: Mucous membranes dry NECK: supple no meningeal signs SPINE:entire spine nontender CV: S1/S2 noted, no murmurs/rubs/gallops noted LUNGS: scattered wheeze noted, mild tachypnea noted, but she is able to speak to me clearly ABDOMEN: soft, significant distention is noted and moderate diffuse tenderness is noted.  Decreased BS noted throughout Rectal - stool color brown, no blood noted.  No fecal impaction noted.  Female chaperone present GU:no cva tenderness NEURO: Pt is awake/alert, moves all extremitiesx4 EXTREMITIES: pulses normal, full ROM SKIN: warm, color normal PSYCH: no abnormalities of mood noted  ED Course  Procedures Pt here for abd pain/distention.  Initial concern for bowel obstruction, but this was ruled out by CT imaging She does have significant ascites which is new for her.  She has h/o hepatic steatosis but no formal diagnosis of cirrhosis.  She currently appears to have cirrhosis.  Her abd pain is improving, and it is not rigid/firm at this time to suggest acute SBP.    However, due to her body habitus and unable to take full/deep breaths, she is hypoxic on RA.  This corrects with supplemental O2.  I feel she would benefit from admission for further exploration of her possible cirrhosis, need for paracentesis and oxygen requirement.  I was planning to schedule outpatient  paracentesis but she can now have this done inpatient  D/w dr Shanon Brow will admit to [facility]  Labs Review Labs Reviewed  CBC WITH DIFFERENTIAL - Abnormal;  Notable for the following:    WBC 10.6 (*)    RBC 3.65 (*)    Hemoglobin 11.8 (*)    HCT 33.8 (*)    Neutro Abs 8.0 (*)    All other components within normal limits  COMPREHENSIVE METABOLIC PANEL - Abnormal; Notable for the following:    Sodium 134 (*)    Chloride 94 (*)    CO2 34 (*)    BUN 4 (*)    Calcium 8.3 (*)    Albumin 1.9 (*)    Total Bilirubin 1.4 (*)    All other components within normal limits  PROTIME-INR  I-STAT CG4 LACTIC ACID, ED   Imaging Review Ct Abdomen Pelvis W Contrast  06/06/2013   CLINICAL DATA:  Constipation, hepatitis-C, status postcholecystectomy, tubal ligation  EXAM: CT ABDOMEN AND PELVIS WITH CONTRAST  TECHNIQUE: Multidetector CT imaging of the abdomen and pelvis was performed using the standard protocol following bolus administration of intravenous contrast.  CONTRAST:  91mL OMNIPAQUE IOHEXOL 300 MG/ML SOLN, 169mL OMNIPAQUE IOHEXOL 300 MG/ML SOLN  COMPARISON:  03/19/2013  FINDINGS: Sagittal images of the spine shows mild degenerative changes. Significant disc space flattening with endplate sclerotic changes and vacuum disc phenomenon at L5-S1 level. Lung bases are unremarkable. There is abundant abdominal and pelvic ascites. Again noted significant decreased attenuation of the liver consistent with fatty infiltration. The patient is status postcholecystectomy. Subtle nodular liver contour suspicious for cirrhosis. Clinical correlation is necessary. Recanalized umbilical vein again noted.  The spleen pancreas and adrenal glands are unremarkable. Kidneys are symmetrical in size and enhancement.  Oral contrast material was given to the patient. No small bowel obstruction. There is no colonic obstruction. The uterus is atrophic. Urinary bladder is unremarkable. Abundant pelvic ascites. Mild anasarca infiltration of  subcutaneous fat lower abdomen and pelvic wall.  Delayed renal images shows bilateral renal symmetrical excretion. No aortic aneurysm. Bilateral visualized proximal ureter is unremarkable.  IMPRESSION: 1. There is abundant abdominal and pelvic ascites. Again noted significant fatty infiltration of the liver. Subtle nodular contour of the liver. Cirrhosis cannot be excluded. Clinical correlation is necessary. 2. No hydronephrosis or hydroureter. 3. No small bowel or colonic obstruction. 4. Bilateral renal symmetrical excretion. 5. Degenerative changes lumbar spine.   Electronically Signed   By: Lahoma Crocker M.D.   On: 06/06/2013 20:46   Dg Chest Portable 1 View  06/06/2013   CLINICAL DATA:  Cough, HX GUILLAIN-BARRE new  EXAM: PORTABLE CHEST - 1 VIEW  COMPARISON:  DG CHEST 1V PORT dated 04/22/2013  FINDINGS: Normal mediastinum and cardiac silhouette. Normal pulmonary vasculature. No evidence of effusion, infiltrate, or pneumothorax. No acute bony abnormality.  IMPRESSION: No acute cardiopulmonary process.   Electronically Signed   By: Suzy Bouchard M.D.   On: 06/06/2013 19:03     MDM   Final diagnoses:  Abdominal pain  Hypoxia    Nursing notes including past medical history and social history reviewed and considered in documentation xrays reviewed and considered Labs/vital reviewed and considered Previous records reviewed and considered     Sharyon Cable, MD 06/06/13 2257

## 2013-06-06 NOTE — H&P (Signed)
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  She says she doesn't know why she is swelling so much.  She has had 2 previous hospitalizations with what was thought to be abnormal liver function tests and swelling from possible underlying cirrhosis of the liver.  She has f/u appt with gi in several weeks but has not been diagnosed with cirrhosis.  She was on lasix 40mg  po daily (per pt report) but her pcp decreased this to 20mg  daily about a month ago.  She has never been this swollen.  Sob is also new for her, she has never needed a paracentesis before.  No fevers.  No n/v.  No dairrhea.  No cp.    Review of Systems:  Positive and negative as per HPI otherwise all other systems are negative  Past Medical History: Past Medical History  Diagnosis Date  . Guillain-Barre   . Blind   . Neuropathy   . Thyroid disease   . Back pain   . Arthritis   . Seizures   . UTI (lower urinary tract infection)   . Anxiety   . Depression   . DDD (degenerative disc disease), lumbar   . Hepatitis C antibody test positive     HCV RNA is negative 03/2013  . Mild diastolic dysfunction 08/20/3265  . Esophagitis, erosive 03/2014  . Hepatic steatosis 04/23/2013  . Hypomagnesemia 04/23/2013   Past Surgical History  Procedure Laterality Date  . Cholecystectomy    . Tubal ligation    . Femur fracture surgery    . Ankle surgery    . Back surgery    . Esophagogastroduodenoscopy N/A 03/21/2013    Procedure: ESOPHAGOGASTRODUODENOSCOPY (EGD);  Surgeon: Daneil Dolin, MD;  Location: [facility];  Service: Endoscopy;  Laterality: N/A;    Medications: Prior to Admission medications   Medication Sig Start Date End Date Taking? Authorizing Provider  cyanocobalamin (,VITAMIN B-12,) 1000 MCG/ML injection Inject 1,000 mcg into the muscle  every 30 (thirty) days. Sunday   Yes Historical Provider, MD  cyclobenzaprine (FLEXERIL) 10 MG tablet Take 10 mg by mouth 3 (three) times daily.   Yes Historical Provider, MD  folic acid (FOLVITE) 1 MG tablet Take 1 tablet (1 mg total) by mouth daily. 03/26/13  Yes Samuella Cota, MD  furosemide (LASIX) 40 MG tablet Take 40 mg by mouth 2 (two) times daily.   Yes Historical Provider, MD  levothyroxine (SYNTHROID, LEVOTHROID) 50 MCG tablet Take 50 mcg by mouth daily before breakfast.   Yes Historical Provider, MD  ondansetron (ZOFRAN) 4 MG tablet Take 1 tablet (4 mg total) by mouth every 6 (six) hours as needed for nausea. 03/26/13  Yes Samuella Cota, MD  oxyCODONE (OXY IR/ROXICODONE) 5 MG immediate release tablet Take 5 mg by mouth 3 (three) times daily. 04/24/13 06/06/13 Yes Rexene Alberts, MD  OxyCODONE (OXYCONTIN) 10 mg T12A 12 hr tablet Take 10 mg by mouth 3 (three) times daily.   Yes Historical Provider, MD  pantoprazole (PROTONIX) 40 MG tablet Take 1 tablet (40 mg total) by mouth 2 (two) times daily before a meal. 03/26/13  Yes Samuella Cota, MD  potassium chloride SA (K-DUR,KLOR-CON) 20 MEQ tablet Take 1 tablet (20 mEq total) by mouth daily. 04/24/13  Yes Rexene Alberts, MD  pregabalin (  LYRICA) 100 MG capsule Take 100-200 mg by mouth 2 (two) times daily. 1 in the morning and 2 at bedtime   Yes Historical Provider, MD  pregabalin (LYRICA) 50 MG capsule Take 50-100 mg by mouth 2 (two) times daily. 1 in the morning and 2 at bedtime   Yes Historical Provider, MD  thiamine 100 MG tablet Take 1 tablet (100 mg total) by mouth daily. 05/01/12  Yes Nimish Normajean Glasgow, MD    Allergies:   Allergies  Allergen Reactions  . Neurontin [Gabapentin] Anaphylaxis  . Acetaminophen Itching and Nausea And Vomiting  . Eggs Or Egg-Derived Products Nausea And Vomiting  . Codeine Itching and Nausea And Vomiting    Patient is able to take morphine, demerol and fentanyl with out any type of side effects     Social History:  reports that she has been smoking Cigarettes.  She has been smoking about 0.50 packs per day. She does not have any smokeless tobacco history on file. She reports that she does not drink alcohol or use illicit drugs.  Family History: none  Physical Exam: Filed Vitals:   06/06/13 1907 06/06/13 1912 06/06/13 2041 06/06/13 2201  BP:   106/63 114/76  Pulse: 94 93 98 101  Temp:      TempSrc:      Resp:   18   Height:      Weight:      SpO2: 100% 99% 93% 98%   General appearance: alert, cooperative and no distress Head: Normocephalic, without obvious abnormality, atraumatic Eyes: negative Nose: Nares normal. Septum midline. Mucosa normal. No drainage or sinus tenderness. Neck: no JVD and supple, symmetrical, trachea midline Lungs: clear to auscultation bilaterally Heart: regular rate and rhythm, S1, S2 normal, no murmur, click, rub or gallop Abdomen: abnormal findings:  ascites and distended soft pos bs no r/g, caput medusae Extremities: edema 2+ble Pulses: 2+ and symmetric Skin: Skin color, texture, turgor normal. No rashes or lesions Neurologic: Grossly normal    Labs on Admission:   Recent Labs  06/06/13 1815  NA 134*  K 4.2  CL 94*  CO2 34*  GLUCOSE 96  BUN 4*  CREATININE 0.52  CALCIUM 8.3*    Recent Labs  06/06/13 1815  AST 28  ALT 10  ALKPHOS 106  BILITOT 1.4*  PROT 6.9  ALBUMIN 1.9*    Recent Labs  06/06/13 1815  WBC 10.6*  NEUTROABS 8.0*  HGB 11.8*  HCT 33.8*  MCV 92.6  PLT 232    Radiological Exams on Admission: Ct Abdomen Pelvis W Contrast  06/06/2013   CLINICAL DATA:  Constipation, hepatitis-C, status postcholecystectomy, tubal ligation  EXAM: CT ABDOMEN AND PELVIS WITH CONTRAST  TECHNIQUE: Multidetector CT imaging of the abdomen and pelvis was performed using the standard protocol following bolus administration of intravenous contrast.  CONTRAST:  64mL OMNIPAQUE IOHEXOL 300 MG/ML SOLN, OMNIPAQUE IOHEXOL 300  MG/ML SOLN  COMPARISON:  03/19/2013  FINDINGS: Sagittal images of the spine shows mild degenerative changes. Significant disc space flattening with endplate sclerotic changes and vacuum disc phenomenon at L5-S1 level. Lung bases are unremarkable. There is abundant abdominal and pelvic ascites. Again noted significant decreased attenuation of the liver consistent with fatty infiltration. The patient is status postcholecystectomy. Subtle nodular liver contour suspicious for cirrhosis. Clinical correlation is necessary. Recanalized umbilical vein again noted.  The spleen pancreas and adrenal glands are unremarkable. Kidneys are symmetrical in size and enhancement.  Oral contrast material was given to the patient. No small  bowel obstruction. There is no colonic obstruction. The uterus is atrophic. Urinary bladder is unremarkable. Abundant pelvic ascites. Mild anasarca infiltration of subcutaneous fat lower abdomen and pelvic wall.  Delayed renal images shows bilateral renal symmetrical excretion. No aortic aneurysm. Bilateral visualized proximal ureter is unremarkable.  IMPRESSION: 1. There is abundant abdominal and pelvic ascites. Again noted significant fatty infiltration of the liver. Subtle nodular contour of the liver. Cirrhosis cannot be excluded. Clinical correlation is necessary. 2. No hydronephrosis or hydroureter. 3. No small bowel or colonic obstruction. 4. Bilateral renal symmetrical excretion. 5. Degenerative changes lumbar spine.   Electronically Signed   By: Lahoma Crocker M.D.   On: 06/06/2013 20:46   Dg Chest Portable 1 View  06/06/2013   CLINICAL DATA:  Cough, HX GUILLAIN-BARRE new  EXAM: PORTABLE CHEST - 1 VIEW  COMPARISON:  DG CHEST 1V PORT dated 04/22/2013  FINDINGS: Normal mediastinum and cardiac silhouette. Normal pulmonary vasculature. No evidence of effusion, infiltrate, or pneumothorax. No acute bony abnormality.  IMPRESSION: No acute cardiopulmonary process.   Electronically Signed   By: Suzy Bouchard M.D.   On: 06/06/2013 19:03    Assessment/Plan  53 yo female with hypoxic resp failure secondary to worsening ascites from probable underlying cirrhosis of the liver of unclear etiology  Principal Problem:   probable Cirrhosis of liver-  Lasix 40mg  iv bid, will order US paracentesis for diagnositic and therapeutic value.  GI consulted also.  Oxygen supplementation prn.    Active Problems:  All stable   ETOH abuse  Denies any intake for years   Seizure disorder   Nausea and vomiting in adult   Hepatitis C   PUD (peptic ulcer disease)   Anasarca   Mild diastolic dysfunction   Esophagitis, erosive   Hepatic steatosis   Morbid obesity   Acute respiratory failure with hypoxia  Full code.  Kiely Cousar A 06/06/2013, 10:40 PM

## 2013-06-07 ENCOUNTER — Encounter (HOSPITAL_COMMUNITY): Payer: Self-pay | Admitting: Gastroenterology

## 2013-06-07 ENCOUNTER — Ambulatory Visit (HOSPITAL_COMMUNITY)
Admit: 2013-06-07 | Discharge: 2013-06-07 | Disposition: A | Discharge status: Home / Self Care | Payer: Medicare Other | Attending: Emergency Medicine | Admitting: Emergency Medicine

## 2013-06-07 VITALS — BP 90/63 | HR 90 | Temp 98.4°F | Resp 20

## 2013-06-07 DIAGNOSIS — R109 Unspecified abdominal pain: Secondary | ICD-10-CM

## 2013-06-07 DIAGNOSIS — R188 Other ascites: Secondary | ICD-10-CM

## 2013-06-07 DIAGNOSIS — K279 Peptic ulcer, site unspecified, unspecified as acute or chronic, without hemorrhage or perforation: Secondary | ICD-10-CM

## 2013-06-07 LAB — COMPREHENSIVE METABOLIC PANEL
ALBUMIN: 2 g/dL — AB (ref 3.5–5.2)
ALT: 10 U/L (ref 0–35)
AST: 29 U/L (ref 0–37)
Alkaline Phosphatase: 110 U/L (ref 39–117)
BUN: 4 mg/dL — ABNORMAL LOW (ref 6–23)
CALCIUM: 8.3 mg/dL — AB (ref 8.4–10.5)
CO2: 38 mEq/L — ABNORMAL HIGH (ref 19–32)
CREATININE: 0.57 mg/dL (ref 0.50–1.10)
Chloride: 90 mEq/L — ABNORMAL LOW (ref 96–112)
GFR calc Af Amer: >90 mL/min (ref >90)
GFR calc non Af Amer: >90 mL/min (ref >90)
Glucose, Bld: 96 mg/dL (ref 70–99)
Potassium: 3.2 mEq/L — ABNORMAL LOW (ref 3.7–5.3)
Sodium: 135 mEq/L — ABNORMAL LOW (ref 137–147)
Total Bilirubin: 1.6 mg/dL — ABNORMAL HIGH (ref 0.3–1.2)
Total Protein: 7.1 g/dL (ref 6.0–8.3)

## 2013-06-07 LAB — CBC
HCT: 33.8 % — ABNORMAL LOW (ref 36.0–46.0)
Hemoglobin: 11.5 g/dL — ABNORMAL LOW (ref 12.0–15.0)
MCH: 31.9 pg (ref 26.0–34.0)
MCHC: 34 g/dL (ref 30.0–36.0)
MCV: 93.6 fL (ref 78.0–100.0)
Platelets: 220 10*3/uL (ref 150–400)
RBC: 3.61 MIL/uL — AB (ref 3.87–5.11)
RDW: 15.7 % — ABNORMAL HIGH (ref 11.5–15.5)
WBC: 10.7 10*3/uL — AB (ref 4.0–10.5)

## 2013-06-07 LAB — BODY FLUID CELL COUNT WITH DIFFERENTIAL
Eos, Fluid: 0 %
LYMPHS FL: 18 %
MONOCYTE-MACROPHAGE-SEROUS FLUID: 74 % (ref 50–90)
NEUTROPHIL FLUID: 8 % (ref 0–25)
Other Cells, Fluid: 0 %
Total Nucleated Cell Count, Fluid: 158 cu mm (ref 0–1000)

## 2013-06-07 LAB — PROTIME-INR
INR: 1.24 (ref 0.00–1.49)
PROTHROMBIN TIME: 15.3 s — AB (ref 11.6–15.2)

## 2013-06-07 LAB — ALBUMIN, FLUID (OTHER): Albumin, Fluid: 0.7 g/dL

## 2013-06-07 MED ORDER — POTASSIUM CHLORIDE CRYS ER 20 MEQ PO TBCR
20.0000 meq | EXTENDED_RELEASE_TABLET | Freq: Every day | ORAL | Status: DC
Start: 2013-06-08 — End: 2013-06-07

## 2013-06-07 MED ORDER — MORPHINE SULFATE 2 MG/ML IJ SOLN
1.0000 mg | Freq: Once | INTRAMUSCULAR | Status: AC
  Administered 2013-06-07: 1 mg via INTRAVENOUS
  Filled 2013-06-07: qty 1

## 2013-06-07 MED ORDER — CYANOCOBALAMIN 1000 MCG/ML IJ SOLN
1000.0000 ug | INTRAMUSCULAR | Status: DC
  Administered 2013-06-07: 1000 ug via INTRAMUSCULAR
  Filled 2013-06-07: qty 1

## 2013-06-07 MED ORDER — OXYCODONE HCL 5 MG PO TABS: 10.0000 mg | ORAL_TABLET | Freq: Three times a day (TID) | ORAL | Status: DC

## 2013-06-07 MED ORDER — SODIUM CHLORIDE 0.9 % IJ SOLN: 3.0000 mL | INTRAMUSCULAR | Status: DC | PRN

## 2013-06-07 MED ORDER — PREGABALIN 50 MG PO CAPS
50.0000 mg | ORAL_CAPSULE | Freq: Every day | ORAL | Status: DC
  Administered 2013-06-07 – 2013-06-14 (×8): 50 mg via ORAL
  Filled 2013-06-07 (×8): qty 1

## 2013-06-07 MED ORDER — SPIRONOLACTONE 25 MG PO TABS
100.0000 mg | ORAL_TABLET | Freq: Two times a day (BID) | ORAL | Status: DC
  Administered 2013-06-07 – 2013-06-12 (×10): 100 mg via ORAL
  Filled 2013-06-07 (×10): qty 4

## 2013-06-07 MED ORDER — ALBUMIN HUMAN 25 % IV SOLN: 25.0000 g | INTRAVENOUS | Status: AC

## 2013-06-07 MED ORDER — FOLIC ACID 1 MG PO TABS
1.0000 mg | ORAL_TABLET | Freq: Every day | ORAL | Status: DC
  Administered 2013-06-07 – 2013-06-14 (×8): 1 mg via ORAL
  Filled 2013-06-07 (×8): qty 1

## 2013-06-07 MED ORDER — SODIUM CHLORIDE 0.9 % IV SOLN: 250.0000 mL | INTRAVENOUS | Status: DC | PRN

## 2013-06-07 MED ORDER — OXYCODONE HCL 5 MG PO TABS
5.0000 mg | ORAL_TABLET | Freq: Four times a day (QID) | ORAL | Status: DC | PRN
  Administered 2013-06-07 – 2013-06-10 (×11): 5 mg via ORAL
  Filled 2013-06-07 (×12): qty 1

## 2013-06-07 MED ORDER — OXYCODONE HCL 5 MG PO TABS: 5.0000 mg | ORAL_TABLET | Freq: Three times a day (TID) | ORAL | Status: DC

## 2013-06-07 MED ORDER — OXYCODONE HCL ER 10 MG PO T12A
10.0000 mg | EXTENDED_RELEASE_TABLET | Freq: Two times a day (BID) | ORAL | Status: DC
  Administered 2013-06-07: 10 mg via ORAL
  Filled 2013-06-07: qty 1

## 2013-06-07 MED ORDER — FUROSEMIDE 10 MG/ML IJ SOLN
40.0000 mg | Freq: Two times a day (BID) | INTRAMUSCULAR | Status: DC
  Administered 2013-06-07 (×2): 40 mg via INTRAVENOUS
  Filled 2013-06-07 (×2): qty 4

## 2013-06-07 MED ORDER — ALBUMIN HUMAN 25 % IV SOLN
INTRAVENOUS | Status: AC
Start: 2013-06-07 — End: 2013-06-07
  Administered 2013-06-07: 25 g
  Filled 2013-06-07: qty 100

## 2013-06-07 MED ORDER — ZOLPIDEM TARTRATE 5 MG PO TABS
5.0000 mg | ORAL_TABLET | Freq: Once | ORAL | Status: AC
  Administered 2013-06-07: 5 mg via ORAL
  Filled 2013-06-07: qty 1

## 2013-06-07 MED ORDER — PANTOPRAZOLE SODIUM 40 MG PO TBEC
40.0000 mg | DELAYED_RELEASE_TABLET | Freq: Two times a day (BID) | ORAL | Status: DC
  Administered 2013-06-07 – 2013-06-14 (×16): 40 mg via ORAL
  Filled 2013-06-07 (×16): qty 1

## 2013-06-07 MED ORDER — VITAMIN B-1 100 MG PO TABS
100.0000 mg | ORAL_TABLET | Freq: Every day | ORAL | Status: DC
  Administered 2013-06-07 – 2013-06-14 (×8): 100 mg via ORAL
  Filled 2013-06-07 (×8): qty 1

## 2013-06-07 MED ORDER — LEVOTHYROXINE SODIUM 50 MCG PO TABS
50.0000 ug | ORAL_TABLET | Freq: Every day | ORAL | Status: DC
  Administered 2013-06-07 – 2013-06-14 (×8): 50 ug via ORAL
  Filled 2013-06-07 (×8): qty 1

## 2013-06-07 MED ORDER — CYCLOBENZAPRINE HCL 10 MG PO TABS
10.0000 mg | ORAL_TABLET | Freq: Three times a day (TID) | ORAL | Status: DC
  Administered 2013-06-07: 10 mg via ORAL
  Filled 2013-06-07: qty 1

## 2013-06-07 MED ORDER — SODIUM CHLORIDE 0.9 % IJ SOLN
3.0000 mL | Freq: Two times a day (BID) | INTRAMUSCULAR | Status: DC
  Administered 2013-06-07 – 2013-06-13 (×12): 3 mL via INTRAVENOUS

## 2013-06-07 MED ORDER — ONDANSETRON HCL 4 MG PO TABS
4.0000 mg | ORAL_TABLET | Freq: Four times a day (QID) | ORAL | Status: DC | PRN
  Administered 2013-06-10 – 2013-06-14 (×2): 4 mg via ORAL
  Filled 2013-06-07 (×2): qty 1

## 2013-06-07 MED ORDER — POTASSIUM CHLORIDE CRYS ER 20 MEQ PO TBCR
20.0000 meq | EXTENDED_RELEASE_TABLET | Freq: Every day | ORAL | Status: DC
  Administered 2013-06-07: 20 meq via ORAL
  Filled 2013-06-07: qty 1

## 2013-06-07 MED ORDER — PREGABALIN 50 MG PO CAPS
100.0000 mg | ORAL_CAPSULE | Freq: Every day | ORAL | Status: DC
  Administered 2013-06-07 – 2013-06-13 (×8): 100 mg via ORAL
  Filled 2013-06-07 (×8): qty 2

## 2013-06-07 MED ORDER — OXYCODONE HCL ER 10 MG PO T12A
10.0000 mg | EXTENDED_RELEASE_TABLET | Freq: Three times a day (TID) | ORAL | Status: DC
  Administered 2013-06-07: 10 mg via ORAL
  Filled 2013-06-07: qty 1

## 2013-06-07 MED ORDER — FUROSEMIDE 10 MG/ML IJ SOLN
40.0000 mg | Freq: Two times a day (BID) | INTRAMUSCULAR | Status: DC
  Administered 2013-06-08 – 2013-06-09 (×3): 40 mg via INTRAVENOUS
  Filled 2013-06-07 (×3): qty 4

## 2013-06-07 MED ORDER — CYCLOBENZAPRINE HCL 10 MG PO TABS
10.0000 mg | ORAL_TABLET | Freq: Three times a day (TID) | ORAL | Status: DC | PRN
  Administered 2013-06-08 – 2013-06-14 (×9): 10 mg via ORAL
  Filled 2013-06-07 (×10): qty 1

## 2013-06-07 MED ORDER — POTASSIUM CHLORIDE CRYS ER 20 MEQ PO TBCR
40.0000 meq | EXTENDED_RELEASE_TABLET | Freq: Two times a day (BID) | ORAL | Status: AC
  Administered 2013-06-07 (×2): 40 meq via ORAL
  Filled 2013-06-07 (×2): qty 2

## 2013-06-07 NOTE — Consult Note (Signed)
Referring Provider: Dr. Derrill Kay Primary Care Physician:  Dr. Wenda Overland  Primary Gastroenterologist:  Dr. Gala Romney   Date of Admission: 06/06/13 Date of Consultation: 06/07/13  Reason for Consultation:  Ascites, anasarca  HPI:  [name] is a 53 year old female well-known to our practice from a recent admission. At that time, she had an EGD with findings of severe erosive reflux esophagitis, PUD secondary to NSAIDs with likely partial gastric outlet obstruction, acute blood loss anemia, possible cirrhosis with elevated LFTs but work-up for PBC, autoimmune process unremarkable. Her IgG/IgA were elevated and anti-smooth muscle Ab weakly positive, unclear significance due to acute illness/ETOH use.  Ferritin elevated but non-specific in setting of acute illness. Hx of Hep C but RNA undetectable.    Presented with nagging pain in left side, persistent, then stomach started swelling like a basketball. LUQ pain is chronic, present during last admission. Looked like she was going to give birth. States she has been taking Lasix as an outpatient; states it had been increased to 40 mg but was "losing all my potassium" so it was decreased to 20 mg daily. Felt like it was difficult to take a deep breath in with abdominal distension. Feels like something is squeezing her ribs. States she hadn't eaten in 4 days because it would "come back up". Too much pressure in abdomen. Associated nausea. Denies confusion, mental status changes. Denies any added salt. HOWEVER, reports eating [employer] cream of potato soup. CONTINUES TO TAKE ADVIL FOR HEADACHES. Denies any recent fever/chills.   Bowel habits: taking Senokot. During last admission had multiple loose stools with Linzess. Last colonoscopy about 15 years ago. Scant hematochezia. Last BM last Wednesday.   Past Medical History  Diagnosis Date  . Guillain-Barre   . Blind   . Neuropathy   . Thyroid disease   . Back pain   . Arthritis   . Seizures    . UTI (lower urinary tract infection)   . Anxiety   . Depression   . DDD (degenerative disc disease), lumbar   . Hepatitis C antibody test positive     HCV RNA is negative 03/2013  . Mild diastolic dysfunction 6/64/4034  . Esophagitis, erosive 03/2014  . Hepatic steatosis 04/23/2013  . Hypomagnesemia 04/23/2013    Past Surgical History  Procedure Laterality Date  . Cholecystectomy    . Tubal ligation    . Femur fracture surgery    . Ankle surgery    . Back surgery    . Esophagogastroduodenoscopy N/A 03/21/2013    Dr. Gala Romney: severe erosive reflux esophagitis, hiatal hernia, gastric ulcer with element of partial gastric outlet obstruction secondary to ulcer effect. Likely r/t NSAIDs. negative H.pylori    Prior to Admission medications   Medication Sig Start Date End Date Taking? Authorizing Provider  cyanocobalamin (,VITAMIN B-12,) 1000 MCG/ML injection Inject 1,000 mcg into the muscle every 30 (thirty) days. Sunday   Yes Historical Provider, MD  cyclobenzaprine (FLEXERIL) 10 MG tablet Take 10 mg by mouth 3 (three) times daily.   Yes Historical Provider, MD  folic acid (FOLVITE) 1 MG tablet Take 1 tablet (1 mg total) by mouth daily. 03/26/13  Yes Samuella Cota, MD  furosemide (LASIX) 40 MG tablet Take 40 mg by mouth 2 (two) times daily.   Yes Historical Provider, MD  levothyroxine (SYNTHROID, LEVOTHROID) 50 MCG tablet Take 50 mcg by mouth daily before breakfast.   Yes Historical Provider, MD  ondansetron (ZOFRAN) 4 MG tablet Take 1  tablet (4 mg total) by mouth every 6 (six) hours as needed for nausea. 03/26/13  Yes Samuella Cota, MD  OxyCODONE (OXYCONTIN) 10 mg T12A 12 hr tablet Take 10 mg by mouth 3 (three) times daily.   Yes Historical Provider, MD  pantoprazole (PROTONIX) 40 MG tablet Take 1 tablet (40 mg total) by mouth 2 (two) times daily before a meal. 03/26/13  Yes Samuella Cota, MD  potassium chloride SA (K-DUR,KLOR-CON) 20 MEQ tablet Take 1 tablet (20 mEq total) by  mouth daily. 04/24/13  Yes Rexene Alberts, MD  pregabalin (LYRICA) 100 MG capsule Take 100-200 mg by mouth 2 (two) times daily. 1 in the morning and 2 at bedtime   Yes Historical Provider, MD  pregabalin (LYRICA) 50 MG capsule Take 50-100 mg by mouth 2 (two) times daily. 1 in the morning and 2 at bedtime   Yes Historical Provider, MD  thiamine 100 MG tablet Take 1 tablet (100 mg total) by mouth daily. 05/01/12  Yes Nimish Luther Parody, MD    Current Facility-Administered Medications  Medication Dose Route Frequency Provider Last Rate Last Dose  . 0.9 %  sodium chloride infusion  250 mL Intravenous PRN Phillips Grout, MD      . cyanocobalamin ((VITAMIN B-12)) injection 1,000 mcg  1,000 mcg Intramuscular Q30 days Phillips Grout, MD   1,000 mcg at 06/07/13 0200  . cyclobenzaprine (FLEXERIL) tablet 10 mg  10 mg Oral TID Phillips Grout, MD      . folic acid (FOLVITE) tablet 1 mg  1 mg Oral Daily Rachal A Shanon Brow, MD      . furosemide (LASIX) injection 40 mg  40 mg Intravenous Q12H Phillips Grout, MD   40 mg at 06/07/13 0200  . levothyroxine (SYNTHROID, LEVOTHROID) tablet 50 mcg  50 mcg Oral QAC breakfast Phillips Grout, MD      . ondansetron [facility]) tablet 4 mg  4 mg Oral Q6H PRN Phillips Grout, MD      . OxyCODONE (OXYCONTIN) 12 hr tablet 10 mg  10 mg Oral Q8H Phillips Grout, MD   10 mg at 06/07/13 0214  . pantoprazole (PROTONIX) EC tablet 40 mg  40 mg Oral BID AC Rachal A Shanon Brow, MD      . potassium chloride SA (K-DUR,KLOR-CON) CR tablet 20 mEq  20 mEq Oral Daily Phillips Grout, MD      . pregabalin (LYRICA) capsule 100 mg  100 mg Oral QHS Phillips Grout, MD   100 mg at 06/07/13 0200  . pregabalin (LYRICA) capsule 50 mg  50 mg Oral Daily Rachal A Shanon Brow, MD      . sodium chloride 0.9 % injection 3 mL  3 mL Intravenous Q12H Rachal A Shanon Brow, MD      . sodium chloride 0.9 % injection 3 mL  3 mL Intravenous PRN Phillips Grout, MD      . thiamine (VITAMIN B-1) tablet 100 mg  100 mg Oral Daily Phillips Grout, MD         Allergies as of 06/06/2013 - Review Complete 06/06/2013  Allergen Reaction Noted  . Neurontin [gabapentin] Anaphylaxis 07/25/2011  . Acetaminophen Itching and Nausea And Vomiting 07/25/2011  . Eggs or egg-derived products Nausea And Vomiting 07/25/2011  . Codeine Itching and Nausea And Vomiting 07/25/2011    Family History  Problem Relation Age of Onset  . Colon cancer Neg Hx     History   Social History  .  Marital Status: Married    Spouse Name: N/A    Number of Children: N/A  . Years of Education: N/A   Occupational History  . Not on file.   Social History Main Topics  . Smoking status: Current Every Day Smoker -- 0.50 packs/day    Types: Cigarettes  . Smokeless tobacco: Not on file  . Alcohol Use: No     Comment: hx of ETOH abuse, none in 6 months  . Drug Use: No  . Sexual Activity: No   Other Topics Concern  . Not on file   Social History Narrative  . No narrative on file    Review of Systems: Gen: see HPI CV: Denies chest pain, heart palpitations Resp: +SOB, +DOE GI: see HPI GU : Denies urinary burning, urinary frequency, urinary incontinence.  MS: +joint pain Derm: Denies rash, itching, dry skin Psych: +anxiety "over the top".  Heme: Denies bruising, bleeding, and enlarged lymph nodes.  Physical Exam: Vital signs in last 24 hours: Temp:  [98.1 F (36.7 C)-98.9 F (37.2 C)] 98.1 F (36.7 C) (02/24 0093) Pulse Rate:  [92-101] 92 (02/24 0637) Resp:  [18-20] 20 (02/24 0637) BP: (99-120)/(63-84) 99/73 mmHg (02/24 0637) SpO2:  [88 %-100 %] 97 % (02/24 0637) Weight:  [180 lb (81.647 kg)-218 lb 0.6 oz (98.9 kg)] 218 lb 0.6 oz (98.9 kg) (02/24 0637) Last BM Date: 06/01/13 General:   Alert,  Anxious.  Head:  Normocephalic and atraumatic. Eyes:  Sclera clear, mild icterus Ears:  Normal auditory acuity. Nose:  No deformity, discharge,  or lesions. Mouth:  No deformity or lesions, dentition normal. Neck:  Supple; no masses or thyromegaly. Lungs:   Clear throughout to auscultation.   No wheezes, crackles, or rhonchi. No acute distress. Heart:  S1 S2 present without murmur Abdomen:  Distended, protuberant, tense ascites.  Rectal:  Deferred until time of colonoscopy.   Msk:  Symmetrical without gross deformities. Normal posture. Extremities:  Generalized edema bilateral lower extremities, 1-2+ Neurologic:  Alert and  oriented x4;  grossly normal neurologically. Negative asterixis.  Psych:  Alert and cooperative. Mildly anxious.   Intake/Output from previous day: 02/23 0701 - 02/24 0700 In: 240 [P.O.:240] Out: 175 [Urine:175] Intake/Output this shift: Total I/O In: -  Out: 150 [Urine:150]  Lab Results:  Recent Labs  06/06/13 1815 06/07/13 0704  WBC 10.6* 10.7*  HGB 11.8* 11.5*  HCT 33.8* 33.8*  PLT 232 220   BMET  Recent Labs  06/06/13 1815 06/07/13 0704  NA 134* 135*  K 4.2 3.2*  CL 94* 90*  CO2 34* 38*  GLUCOSE 96 96  BUN 4* 4*  CREATININE 0.52 0.57  CALCIUM 8.3* 8.3*   LFT  Recent Labs  06/06/13 1815 06/07/13 0704  PROT 6.9 7.1  ALBUMIN 1.9* 2.0*  AST 28 29  ALT 10 10  ALKPHOS 106 110  BILITOT 1.4* 1.6*   PT/INR  Recent Labs  06/06/13 1815 06/07/13 0704  LABPROT 15.2 15.3*  INR 1.23 1.24    Studies/Results: Ct Abdomen Pelvis W Contrast  06/06/2013   CLINICAL DATA:  Constipation, hepatitis-C, status postcholecystectomy, tubal ligation  EXAM: CT ABDOMEN AND PELVIS WITH CONTRAST  TECHNIQUE: Multidetector CT imaging of the abdomen and pelvis was performed using the standard protocol following bolus administration of intravenous contrast.  CONTRAST:  21mL OMNIPAQUE IOHEXOL 300 MG/ML SOLN, 160mL OMNIPAQUE IOHEXOL 300 MG/ML SOLN  COMPARISON:  03/19/2013  FINDINGS: Sagittal images of the spine shows mild degenerative changes. Significant disc space flattening with  endplate sclerotic changes and vacuum disc phenomenon at L5-S1 level. Lung bases are unremarkable. There is abundant abdominal and pelvic  ascites. Again noted significant decreased attenuation of the liver consistent with fatty infiltration. The patient is status postcholecystectomy. Subtle nodular liver contour suspicious for cirrhosis. Clinical correlation is necessary. Recanalized umbilical vein again noted.  The spleen pancreas and adrenal glands are unremarkable. Kidneys are symmetrical in size and enhancement.  Oral contrast material was given to the patient. No small bowel obstruction. There is no colonic obstruction. The uterus is atrophic. Urinary bladder is unremarkable. Abundant pelvic ascites. Mild anasarca infiltration of subcutaneous fat lower abdomen and pelvic wall.  Delayed renal images shows bilateral renal symmetrical excretion. No aortic aneurysm. Bilateral visualized proximal ureter is unremarkable.  IMPRESSION: 1. There is abundant abdominal and pelvic ascites. Again noted significant fatty infiltration of the liver. Subtle nodular contour of the liver. Cirrhosis cannot be excluded. Clinical correlation is necessary. 2. No hydronephrosis or hydroureter. 3. No small bowel or colonic obstruction. 4. Bilateral renal symmetrical excretion. 5. Degenerative changes lumbar spine.   Electronically Signed   By: Lahoma Crocker M.D.   On: 06/06/2013 20:46   Dg Chest Portable 1 View  06/06/2013   CLINICAL DATA:  Cough, HX GUILLAIN-BARRE new  EXAM: PORTABLE CHEST - 1 VIEW  COMPARISON:  DG CHEST 1V PORT dated 04/22/2013  FINDINGS: Normal mediastinum and cardiac silhouette. Normal pulmonary vasculature. No evidence of effusion, infiltrate, or pneumothorax. No acute bony abnormality.  IMPRESSION: No acute cardiopulmonary process.   Electronically Signed   By: Suzy Bouchard M.D.   On: 06/06/2013 19:03    Impression: 53 year old female presenting with new-onset ascites/anasarca, with CT unable to exclude cirrhosis. Doubt SBP; likely multifactorial secondary to diuretic dosing and inadvertent high intake of salt, dietary non-compliance  (campbell's soup), significant hypoalbuminemia. Prior work-up for elevated LFTs thorough with non-specific findings. Question secondary to fatty liver, hx of ETOH abuse, negative HCV RNA on file. Elevated ferritin non-specific in setting of acute illness.   Abdominal pain chronic, with repeat EGD for PUD due mid March 2015. Patient CONTINUES to take Aleve despite our recommendations last admission.   Also notable chronic constipation likely playing a role in chronic abdominal pain.  Needs routine screening colonoscopy in future at time of EGD, as last was in remote past. Scant hematochezia likely benign in the setting of constipation.   Plan: [place] of abdomen with paracentesis: obtain fluid analysis Albumin 25 g IV if greater than 5 liters removed Protonix BID 2 gram Na diet after [place] Needs surveillance EGD with colonoscopy in mid March 2015 unless indicated sooner Consider combination of Lasix/Aldactone for management  in near future Miralax each evening for constipation Cirrhosis care as outpatient  Orvil Feil, ANP-BC [facility]      LOS: 1 day    06/07/2013, 9:00 AM

## 2013-06-07 NOTE — Progress Notes (Signed)
PROGRESS NOTE  NEIVA MAENZA AYT:[id number] DOB: 1961/02/24 DOA: 06/06/2013 PCP: Celedonio Savage, MD  Summary: 53 year old woman who has been admitted twice in the last 3 months, in December for severe erosive reflux esophagitis, peptic ulcer disease, acute blood loss anemia and suspected cirrhosis and in January of this year for generalized weakness, hypovolemia. She has hepatic steatosis and underlying liver disease of unclear etiology. She presented to the emergency department with increasing swelling and was found to have significant ascites. Admitted for planned GI consultation and paracentesis which was successfully completed 2/24 with removal of 4 L. Aldactone was added. Likely discharge next 48 hours.  Assessment/Plan: 1. Acute hypoxic respiratory failure. Stable. Secondary to massive ascites. Expect spontaneous improvement after paracentesis. Chest x-ray no acute abnormalities. 2. Massive ascites secondary to cirrhosis complicated by salt intake. 3. Left upper quadrant abdominal wall pain. CT of the abdomen and pelvis unremarkable. Pain is in the subcutaneous tissue. Monitor clinically. No evidence of SBP. 4. Hepatitis C, alcoholic hepatitis, hepatic steatosis by CT and ultrasound imaging.  5. History of alcohol abuse 6. Severe erosive reflux esophagitis, peptic ulcer disease causing some element of partial gastric outlet obstruction secondary to NSAID use, by EGD 12/014. Continue PPI. 7. Chronic constipation. Bowel regimen. 8. 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 Legally blind   Wean oxygen.  Aldactone added per GI. Followup fluid analysis. Outpatient followup for cirrhosis care.  Replace potassium.  No NSAIDs.  Anticipate discharge next 24-48 hours.  Pending studies:   Peritoneal fluid analysis  Code Status: full code DVT prophylaxis: SCDs Family Communication:  Disposition Plan:   Murray Hodgkins, MD  [facility]  Pager [phone] If 7PM-7AM, please contact  night-coverage at [email or web address], password [facility] 06/07/2013, 8:21 AM  LOS: 1 day   Consultants:  Gastroenterology  Procedures:  Large-volume paracentesis 4 L.  Antibiotics:    HPI/Subjective: She reports some abdominal wall pain left upper quadrant. This has been present for several weeks. She notes decreased abdominal swelling. She is hungry and would like a diet.  Objective: Filed Vitals:   06/06/13 2041 06/06/13 2201 06/06/13 2327 06/07/13 0637  BP: 106/63 114/76 120/84 99/73  Pulse: 98 101 101 92  Temp:   98.5 F (36.9 C) 98.1 F (36.7 C)  TempSrc:    Oral  Resp: 18  18 20   Height:      Weight:    98.9 kg (218 lb 0.6 oz)  SpO2: 93% 98% 100% 97%    Intake/Output Summary (Last 24 hours) at 06/07/13 0821 Last data filed at 06/07/13 0751  Gross per 24 hour  Intake    240 ml  Output    325 ml  Net    -85 ml     Filed Weights   06/06/13 1759 06/07/13 0637  Weight: 81.647 kg (180 lb) 98.9 kg (218 lb 0.6 oz)    Exam:   Afebrile, vital signs stable. Hypoxia stable on 2 L nasal cannula, 97%.  Gen. Appears calm, mildly uncomfortable. Nontoxic.  Psychiatric. Grossly normal mood and affect. Speech fluent and appropriate.  Respiratory clear to auscultation bilaterally. No wheezes, rales or rhonchi. Normal respiratory effort.  Cardiovascular. Regular rate and rhythm. No murmur, rub or gallop.  Abdomen. Massive distention. Tender left upper quadrant, subcutaneous tissue, superficial palpation. No lesions noted.  Data Reviewed:  Potassium 3.2, CO2 38, total bilirubin 1.6, CBC with mild anemia stable   CT of the abdomen and pelvis demonstrated abundant ascites, nodular contour of the liver, significant fatty infiltration.  Chest x-ray no acute disease  Scheduled Meds: . cyanocobalamin  1,000 mcg Intramuscular Q30 days  . cyclobenzaprine  10 mg Oral TID  . folic acid  1 mg Oral Daily  . furosemide  40 mg Intravenous Q12H  . levothyroxine  50 mcg Oral QAC  breakfast  . OxyCODONE  10 mg Oral Q8H  . pantoprazole  40 mg Oral BID AC  . potassium chloride SA  20 mEq Oral Daily  . pregabalin  100 mg Oral QHS  . pregabalin  50 mg Oral Daily  . sodium chloride  3 mL Intravenous Q12H  . thiamine  100 mg Oral Daily   Continuous Infusions:   Principal Problem:   probable Cirrhosis of liver Active Problems:   ETOH abuse   Seizure disorder   Nausea and vomiting in adult   Hepatitis C   PUD (peptic ulcer disease)   Anasarca   Mild diastolic dysfunction   Esophagitis, erosive   Hepatic steatosis   Morbid obesity   Acute respiratory failure with hypoxia   Time spent 25 minutes

## 2013-06-07 NOTE — Progress Notes (Signed)
Paracentesis complete no signs of distress. 4000 ml yellow colored abdominal fluid removed.  

## 2013-06-07 NOTE — Consult Note (Addendum)
REVIEWED. ADD ALDACTONE 100 MG BID WITH LASIX 40 MG BID. ONE DOSE OF ALDACTONE TODAY. HOLD KCL START LASIX ON FEB 25. AWAIT FLUID ANALYSIS. HAD 4Ls REMOVED  BUT LIKELY HAS 2 Ls LEFT. RADIOLOGY DID NOT COMPLETE PARACENTESIS BECAUSE PT'S FIRST LVP.

## 2013-06-07 NOTE — Procedures (Signed)
PreOperative Dx: Ascites Postoperative Dx: Ascites Procedure:   US guided paracentesis Radiologist:  Sheria Rosello Anesthesia:  10 ml of 1% lidocaine Specimen:  4000 ml of clear yellow ascitic fluid EBL:   < 1 ml Complications: None  

## 2013-06-08 DIAGNOSIS — F1010 Alcohol abuse, uncomplicated: Secondary | ICD-10-CM

## 2013-06-08 DIAGNOSIS — R112 Nausea with vomiting, unspecified: Secondary | ICD-10-CM

## 2013-06-08 DIAGNOSIS — G40909 Epilepsy, unspecified, not intractable, without status epilepticus: Secondary | ICD-10-CM

## 2013-06-08 DIAGNOSIS — B1920 Unspecified viral hepatitis C without hepatic coma: Secondary | ICD-10-CM

## 2013-06-08 DIAGNOSIS — K209 Esophagitis, unspecified without bleeding: Secondary | ICD-10-CM

## 2013-06-08 DIAGNOSIS — K7689 Other specified diseases of liver: Secondary | ICD-10-CM

## 2013-06-08 LAB — BASIC METABOLIC PANEL
BUN: 3 mg/dL — ABNORMAL LOW (ref 6–23)
CALCIUM: 8 mg/dL — AB (ref 8.4–10.5)
CHLORIDE: 94 meq/L — AB (ref 96–112)
CO2: 39 mEq/L — ABNORMAL HIGH (ref 19–32)
Creatinine, Ser: 0.58 mg/dL (ref 0.50–1.10)
GFR calc Af Amer: >90 mL/min (ref >90)
GFR calc non Af Amer: >90 mL/min (ref >90)
Glucose, Bld: 80 mg/dL (ref 70–99)
Potassium: 3.5 mEq/L — ABNORMAL LOW (ref 3.7–5.3)
Sodium: 137 mEq/L (ref 137–147)

## 2013-06-08 LAB — MAGNESIUM: MAGNESIUM: 1.4 mg/dL — AB (ref 1.5–2.5)

## 2013-06-08 MED ORDER — POLYETHYLENE GLYCOL 3350 17 G PO PACK
17.0000 g | PACK | Freq: Every day | ORAL | Status: DC
  Administered 2013-06-08 – 2013-06-14 (×7): 17 g via ORAL
  Filled 2013-06-08 (×7): qty 1

## 2013-06-08 MED ORDER — SODIUM CHLORIDE 0.9 % IV BOLUS (SEPSIS)
500.0000 mL | Freq: Once | INTRAVENOUS | Status: AC
  Administered 2013-06-08: 500 mL via INTRAVENOUS

## 2013-06-08 MED ORDER — ENSURE COMPLETE PO LIQD
237.0000 mL | Freq: Two times a day (BID) | ORAL | Status: DC
  Administered 2013-06-08 – 2013-06-14 (×13): 237 mL via ORAL

## 2013-06-08 NOTE — Progress Notes (Addendum)
Subjective: Abdominal discomfort improved. Continues to report LUQ pain and back pain. Chronic. Mild nausea. No vomiting. Feels very constipated.   Objective: Vital signs in last 24 hours: Temp:  [98 F (36.7 C)-98.8 F (37.1 C)] 98 F (36.7 C) (02/25 0500) Pulse Rate:  [86-96] 86 (02/25 0500) Resp:  [18-20] 20 (02/25 0500) BP: (76-96)/(53-69) 92/59 mmHg (02/25 0503) SpO2:  [82 %-97 %] 97 % (02/25 0503) Weight:  [217 lb 2.5 oz (98.5 kg)] 217 lb 2.5 oz (98.5 kg) (02/25 0500) Last BM Date: 06/01/13 General:   Alert and oriented, anxious Head:  Normocephalic and atraumatic. Eyes:  Mild scleral icterus Heart:  S1, S2 present Lungs: Clear to auscultation bilaterally, without wheezing, rales, or rhonchi.  Abdomen:  Bowel sounds present, obese, full, non-tense ascites.  Extremities:  1+ edema, improved from 2/24.  Neurologic:  Alert and  oriented x4;  grossly normal neurologically. Skin:  Warm and dry, intact without significant lesions.  Psych:  Alert and cooperative. Normal mood and affect.  Intake/Output from previous day: 02/24 0701 - 02/25 0700 In: 640 [P.O.:640] Out: 900 [Urine:900] Intake/Output this shift:    Lab Results:  Recent Labs  06/06/13 1815 06/07/13 0704  WBC 10.6* 10.7*  HGB 11.8* 11.5*  HCT 33.8* 33.8*  PLT 232 220   BMET  Recent Labs  06/06/13 1815 06/07/13 0704 06/08/13 0611  NA 134* 135* 137  K 4.2 3.2* 3.5*  CL 94* 90* 94*  CO2 34* 38* 39*  GLUCOSE 96 96 80  BUN 4* 4* 3*  CREATININE 0.52 0.57 0.58  CALCIUM 8.3* 8.3* 8.0*   LFT  Recent Labs  06/06/13 1815 06/07/13 0704  PROT 6.9 7.1  ALBUMIN 1.9* 2.0*  AST 28 29  ALT 10 10  ALKPHOS 106 110  BILITOT 1.4* 1.6*   PT/INR  Recent Labs  06/06/13 1815 06/07/13 0704  LABPROT 15.2 15.3*  INR 1.23 1.24     Studies/Results: Ct Abdomen Pelvis W Contrast  06/06/2013   CLINICAL DATA:  Constipation, hepatitis-C, status postcholecystectomy, tubal ligation  EXAM: CT ABDOMEN AND  PELVIS WITH CONTRAST  TECHNIQUE: Multidetector CT imaging of the abdomen and pelvis was performed using the standard protocol following bolus administration of intravenous contrast.  CONTRAST:  78mL OMNIPAQUE IOHEXOL 300 MG/ML SOLN, 116mL OMNIPAQUE IOHEXOL 300 MG/ML SOLN  COMPARISON:  03/19/2013  FINDINGS: Sagittal images of the spine shows mild degenerative changes. Significant disc space flattening with endplate sclerotic changes and vacuum disc phenomenon at L5-S1 level. Lung bases are unremarkable. There is abundant abdominal and pelvic ascites. Again noted significant decreased attenuation of the liver consistent with fatty infiltration. The patient is status postcholecystectomy. Subtle nodular liver contour suspicious for cirrhosis. Clinical correlation is necessary. Recanalized umbilical vein again noted.  The spleen pancreas and adrenal glands are unremarkable. Kidneys are symmetrical in size and enhancement.  Oral contrast material was given to the patient. No small bowel obstruction. There is no colonic obstruction. The uterus is atrophic. Urinary bladder is unremarkable. Abundant pelvic ascites. Mild anasarca infiltration of subcutaneous fat lower abdomen and pelvic wall.  Delayed renal images shows bilateral renal symmetrical excretion. No aortic aneurysm. Bilateral visualized proximal ureter is unremarkable.  IMPRESSION: 1. There is abundant abdominal and pelvic ascites. Again noted significant fatty infiltration of the liver. Subtle nodular contour of the liver. Cirrhosis cannot be excluded. Clinical correlation is necessary. 2. No hydronephrosis or hydroureter. 3. No small bowel or colonic obstruction. 4. Bilateral renal symmetrical excretion. 5. Degenerative changes lumbar  spine.   Electronically Signed   By: Lahoma Crocker M.D.   On: 06/06/2013 20:46   US Paracentesis  06/07/2013   CLINICAL DATA:  Ascites, question liver disease  EXAM: ULTRASOUND GUIDED DIAGNOSTIC AND THERAPEUTIC PARACENTESIS   COMPARISON:  None  PROCEDURE: Procedure, benefits, and risks of procedure were discussed with patient.  Written informed consent for procedure was obtained.  Time out protocol followed.  Adequate collection of ascites localized in right lower quadrant.  Skin prepped and draped in usual sterile fashion.  Skin and soft tissues anesthetized with 10 mL of 1% lidocaine.  5 [place] Yueh catheter placed into peritoneal cavity.  4000 mL of clear yellow fluid aspirated by vacuum bottle suction.  Aspiration limited to 4 L of fluid as this is the patient's first paracentesis.  Procedure tolerated well by patient without immediate complication.  FINDINGS: A total of approximately 4000 mL of clear yellow fluid was removed. A fluid sample of 180 mL was sent for laboratory analysis.  IMPRESSION: Successful ultrasound guided paracentesis yielding 4000 mL of ascites.   Electronically Signed   By: Lavonia Dana M.D.   On: 06/07/2013 16:37   Dg Chest Portable 1 View  06/06/2013   CLINICAL DATA:  Cough, HX GUILLAIN-BARRE new  EXAM: PORTABLE CHEST - 1 VIEW  COMPARISON:  DG CHEST 1V PORT dated 04/22/2013  FINDINGS: Normal mediastinum and cardiac silhouette. Normal pulmonary vasculature. No evidence of effusion, infiltrate, or pneumothorax. No acute bony abnormality.  IMPRESSION: No acute cardiopulmonary process.   Electronically Signed   By: Suzy Bouchard M.D.   On: 06/06/2013 19:03    Assessment: 53 year old female presenting with new-onset ascites/anasarca, with CT unable to exclude cirrhosis. Doubt SBP; likely multifactorial secondary to diuretic dosing and inadvertent high intake of salt, dietary non-compliance (campbell's soup), significant hypoalbuminemia. Prior work-up for elevated LFTs thorough with non-specific findings. Question secondary to fatty liver, hx of ETOH abuse, negative HCV RNA on file. Elevated ferritin in the past non-specific in setting of acute illness. LVAP with 4 liters removed 2/24, with improvement in  abdominal discomfort. Likely 2 liters still remaining, as not all removed due to patient's first paracentesis. SAAG score 1.3, indicating likely portal hypertension, which was expected with suspicion of cirrhosis. Final fluid culture pending.   Lasix 40 BID and Aldactone 100 mg BID recommended. May need to titrate depending on patient response, blood work. Potassium 3.5 today.   Abdominal pain chronic, with repeat EGD for PUD due mid March 2015. Also notable chronic constipation likely playing a role in chronic abdominal pain. Needs routine screening colonoscopy in future at time of EGD, as last was in remote past. Scant hematochezia likely benign in the setting of constipation. No BM in about a week; will give Miralax now.       Plan: Follow-up on March 9th as already scheduled as an outpatient Will need TCS/EGD set up at that time.  Miralax now; likely start Amitiza tomorrow Lasix 40 BID and Aldactone 100 mg BID; monitor BMP closely Protonix BID Strict 2 gram Na diet Outpatient follow-up for cirrhosis   Orvil Feil, ANP-BC [facility]     LOS: 2 days    06/08/2013, 8:06 AM    Attending note:  Agree with assessment and recommendations as outlined above. Fluid white count 158.

## 2013-06-08 NOTE — Plan of Care (Addendum)
Texted Hosp to confirm pt diet? Gave verbal for low sodium diet.

## 2013-06-08 NOTE — Progress Notes (Signed)
INITIAL NUTRITION ASSESSMENT  DOCUMENTATION CODES Per approved criteria  -Obesity Unspecified   INTERVENTION: Ensure Complete po BID, each supplement provides 350 kcal and 13 grams of protein. High protein diet and low sodium diet education completed  NUTRITION DIAGNOSIS: Inadequate oral intake related to early satiety as evidenced by PO: 50%.   Goal: Pt will meet >90% of estimated nutritional needs  Monitor:  PO intake, labs, skin assessments, weight changes, I/O's  Reason for Assessment: MD consutl  53 y.o. female  Admitting Dx: Acute respiratory failure with hypoxia  ASSESSMENT: Pt admitted for abdominal pain and hypoxia. Pt very tearful at times during visit, due to pain control and barriers due to her multiple medical conditions. 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 She reports that ADLs can be extremely hard for her. As a result, she reports her husband does most of the cooking in the home and they have both made an effort to eat more healthfully. She reports that she tries to eat a lot of vegetables and lean meats and does not add salt to foods. She complains of early satiety. She generally eats 2 meals per day: an Ensure at lunch and a meat, starch, and vegetable at dinner time. She reports she is trying to get more protein sources in her diet.  She reports hx of weight gain, due to limited mobility. Wt hx revealed a 9.6% wt gain x 1 year and an 8.8% wt loss  X 1 month (which is clinically significant, but diuretic use is likely a contributing factor).   RD provided "Low Sodium Nutrition Therapy" handout from the Academy of [employer]etics. Reviewed patient's dietary recall. Provided examples on ways to decrease sodium intake in diet. Discouraged intake of processed foods and use of salt shaker. Encouraged fresh fruits and vegetables as well as whole grain sources of  carbohydrates to maximize fiber intake. Also discussed eating small, frequent meals daily to increase intake. Discussed sources of protein and gave specific examples of how to incorporate protein in diet.   RD discussed why it is important for patient to adhere to diet recommendations, and emphasized the role of fluids, foods to avoid, and importance of weighing self daily. Teach back method used.  Expect fair compliance.  Height: Ht Readings from Last 1 Encounters:  06/06/13 5\' 4"  (1.626 m)    Weight: Wt Readings from Last 1 Encounters:  06/08/13 217 lb 2.5 oz (98.5 kg)    Ideal Body Weight: 120#  % Ideal Body Weight: 181%  Wt Readings from Last 10 Encounters:  06/08/13 217 lb 2.5 oz (98.5 kg)  04/24/13 238 lb 1.6 oz (108.001 kg)  03/19/13 198 lb 10.2 oz (90.1 kg)  03/19/13 198 lb 10.2 oz (90.1 kg)  01/17/13 180 lb (81.647 kg)  11/01/12 201 lb 11.5 oz (91.5 kg)  05/01/12 198 lb 6.6 oz (90 kg)  04/25/12 200 lb (90.719 kg)  01/26/12 150 lb (68.04 kg)  11/10/11 168 lb (76.204 kg)    Usual Body Weight: 200#  % Usual Body Weight: 109%  BMI:  Body mass index is 37.26 kg/(m^2). Meets criteria for obesity, class II.  Estimated Nutritional Needs: Kcal: 1500-1600 daily Protein: 91-118 grams daily Fluid: 1.5-1.6 L daily  Skin: Intact; distended abdomen  Diet Order: Sodium Restricted  EDUCATION NEEDS: -Education needs addressed   Intake/Output Summary (Last 24 hours) at 06/08/13 1508 Last data filed at  06/08/13 1236  Gross per 24 hour  Intake    470 ml  Output    500 ml  Net    -30 ml    Last BM: 06/01/13   Labs:   Recent Labs Lab 06/06/13 1815 06/07/13 0704 06/08/13 0611  NA 134* 135* 137  K 4.2 3.2* 3.5*  CL 94* 90* 94*  CO2 34* 38* 39*  BUN 4* 4* 3*  CREATININE 0.52 0.57 0.58  CALCIUM 8.3* 8.3* 8.0*  MG  --   --  1.4*  GLUCOSE 96 96 80    CBG (last 3)  No results found for this basename: GLUCAP,  in the last 72 hours  Scheduled Meds: .  cyanocobalamin  1,000 mcg Intramuscular Q30 days  . folic acid  1 mg Oral Daily  . furosemide  40 mg Intravenous BID  . levothyroxine  50 mcg Oral QAC breakfast  . pantoprazole  40 mg Oral BID AC  . polyethylene glycol  17 g Oral Daily  . pregabalin  100 mg Oral QHS  . pregabalin  50 mg Oral Daily  . sodium chloride  3 mL Intravenous Q12H  . spironolactone  100 mg Oral BID  . thiamine  100 mg Oral Daily    Continuous Infusions:   Past Medical History  Diagnosis Date  . Guillain-Barre   . Blind   . Neuropathy   . Thyroid disease   . Back pain   . Arthritis   . Seizures   . UTI (lower urinary tract infection)   . Anxiety   . Depression   . DDD (degenerative disc disease), lumbar   . Hepatitis C antibody test positive     HCV RNA is negative 03/2013  . Mild diastolic dysfunction 11/21/9831  . Esophagitis, erosive 03/2014  . Hepatic steatosis 04/23/2013  . Hypomagnesemia 04/23/2013    Past Surgical History  Procedure Laterality Date  . Cholecystectomy    . Tubal ligation    . Femur fracture surgery    . Ankle surgery    . Back surgery    . Esophagogastroduodenoscopy N/A 03/21/2013    Dr. Gala Romney: severe erosive reflux esophagitis, hiatal hernia, gastric ulcer with element of partial gastric outlet obstruction secondary to ulcer effect. Likely r/t NSAIDs. negative H.pylori   Pilar Westergaard A. Jimmye Norman, RD, LDN Pager: [phone]

## 2013-06-08 NOTE — Discharge Summary (Addendum)
Physician Discharge Summary  [name]:[id number] DOB: Jun 27, 1960 DOA: 06/06/2013  PCP: Celedonio Savage, MD  Admit date: 06/06/2013 Discharge date: 06/08/2013  Time spent: 40 minutes  Recommendations for Outpatient Follow-up:   Acute hypoxic respiratory failure. - Stable.  -Secondary to massive ascites -Patient greatly improved after paracentesis of 4 L removed on 2/24.   Massive ascites - secondary to cirrhosis complicated by salt intake, and poor understanding of disease process -Hepatitis C virus quantitative on 03/21/2013 negative, hepatitis B surface antigen 03/21/2013 negative  Left upper quadrant abdominal wall pain. -Pain is in the subcutaneous tissue. Monitor clinically. -No evidence of SBP. Discharge in the a.m. if stable  Hx alcohol abuse -Per patient no longer consuming alcohol  Severe erosive reflux esophagitis/peptic ulcer disease/partial gastric outlet obstruction - secondary to NSAID use, by EGD 12/014.  -Counseled to discontinue any NSAID use in the future  -Continue PPI.  Chronic constipation.  -Continue MiraLax   Hx seizure disorder (secondary to Lonia Blood) -Not currently on any medication  Diastolic CHF -Echocardiogram from 04/30/2012 suggestive of diastolic CHF  -Will continue Spironolactone, Lasix   Anxiety -Chlordiazepoxide 5 mg twice a day -Patient not doing well with her new diagnosis of liver failure/diastolic CHF will probably benefit from outpatient psychiatric care coping skills.     Discharge Diagnoses:  Principal Problem:   Acute respiratory failure with hypoxia Active Problems:   ETOH abuse   Seizure disorder   Nausea and vomiting in adult   Hepatitis C   PUD (peptic ulcer disease)   Anasarca   Mild diastolic dysfunction   Esophagitis, erosive   Hepatic steatosis   Morbid obesity   probable Cirrhosis of liver   Discharge Condition: Stable  Diet recommendation: "Low Sodium Nutrition, carbohydrates to maximize fiber  intake.     Filed Weights   06/06/13 1759 06/07/13 0637 06/08/13 0500  Weight: 81.647 kg (180 lb) 98.9 kg (218 lb 0.6 oz) 98.5 kg (217 lb 2.5 oz)    History of present illness:  53 yo WF PMHx seizure disorder, probable cirrhosis of the liver, anasarca, nausea vomiting, hepatitis C, hepatic steatosis, erosive esophagitis. Presents to the ED who has been admitted twice in the last 3 months, in December for severe erosive reflux esophagitis, peptic ulcer disease, acute blood loss anemia and suspected cirrhosis and in January of this year for generalized weakness, hypovolemia. She has hepatic steatosis and underlying liver disease of unclear etiology. She presented to the emergency department with increasing swelling and was found to have significant ascites. Admitted for planned GI consultation and paracentesis which was successfully completed 2/24 with removal of 4 L. Aldactone was added. Likely discharge next 48 hours. 2/25 patient has a poor understanding of why she was hospitalized. Believes it was just secondary to her having episode of hearing and understanding a person but not being able to answer correctly. Ascitic fluid NGTD/negative acid-fast bacilli. Patient states she feels like she is doing better.    Procedures: Ultrasound guided paracentesis 06/07/2013 -4 L removed  CT abdomen pelvis with contrast 06/06/2013 1. There is abundant abdominal and pelvic ascites. Again noted  significant fatty infiltration of the liver. Subtle nodular contour  of the liver. Cirrhosis cannot be excluded. Clinical correlation is  necessary.  2. No hydronephrosis or hydroureter.  3. No small bowel or colonic obstruction.  4. Bilateral renal symmetrical excretion.  5. Degenerative changes lumbar spine.  PCXR 06/06/2013 No acute cardiopulmonary process.  Echocardiogram 04/30/2012 -Left ventricle: The cavity size was normal.  moderate concentric hypertrophy. Systolic function was normal.  -LVEF= 60% to  65%.  -(grade 1 diastolic dysfunction). The E/e' ratio is <10, suggesting normal LV filling pressure. - Left atrium: The atrium was at the upper limits of normal in size.      Consultations: Dr. Carlyon Prows fields/NP Laban Emperor (GI)    Antibiotics    Discharge Exam: Filed Vitals:   06/07/13 2033 06/08/13 0500 06/08/13 0503 06/08/13 1308  BP: 94/53 76/53 92/59  82/45  Pulse: 96 86  93  Temp: 98.8 F (37.1 C) 98 F (36.7 C)  98.1 F (36.7 C)  TempSrc: Oral Oral  Oral  Resp: 20 20  20   Height:      Weight:  98.5 kg (217 lb 2.5 oz)    SpO2: 93% 82% 97% 100%    General: A./O. X4,  moderate pain left lateral/CVA Cardiovascular: Regular rhythm and rate, negative murmurs rubs gallops Respiratory: Good auscultation bilateral Abdomen; moderate pain left lateral/CVA to palpation, distended, plus bowel sounds obese  Discharge Instructions   Future Appointments Provider Department Dept Phone   06/20/2013 11:00 AM [facility] [phone]       Medication List    ASK your doctor about these medications       cyanocobalamin 1000 MCG/ML injection  Commonly known as:  (VITAMIN B-12)  Inject 1,000 mcg into the muscle every 30 (thirty) days. Sunday     cyclobenzaprine 10 MG tablet  Commonly known as:  FLEXERIL  Take 10 mg by mouth 3 (three) times daily.     folic acid 1 MG tablet  Commonly known as:  FOLVITE  Take 1 tablet (1 mg total) by mouth daily.     furosemide 40 MG tablet  Commonly known as:  LASIX  Take 40 mg by mouth 2 (two) times daily.     levothyroxine 50 MCG tablet  Commonly known as:  SYNTHROID, LEVOTHROID  Take 50 mcg by mouth daily before breakfast.     ondansetron 4 MG tablet  Commonly known as:  ZOFRAN  Take 1 tablet (4 mg total) by mouth every 6 (six) hours as needed for nausea.     OxyCODONE 10 mg T12a 12 hr tablet  Commonly known as:  OXYCONTIN  Take 10 mg by mouth 3 (three) times daily.      pantoprazole 40 MG tablet  Commonly known as:  PROTONIX  Take 1 tablet (40 mg total) by mouth 2 (two) times daily before a meal.     potassium chloride SA 20 MEQ tablet  Commonly known as:  K-DUR,KLOR-CON  Take 1 tablet (20 mEq total) by mouth daily.     pregabalin 50 MG capsule  Commonly known as:  LYRICA  Take 50-100 mg by mouth 2 (two) times daily. 1 in the morning and 2 at bedtime     pregabalin 100 MG capsule  Commonly known as:  LYRICA  Take 100-200 mg by mouth 2 (two) times daily. 1 in the morning and 2 at bedtime     thiamine 100 MG tablet  Take 1 tablet (100 mg total) by mouth daily.       Allergies  Allergen Reactions  . Neurontin [Gabapentin] Anaphylaxis  . Acetaminophen Itching and Nausea And Vomiting  . Eggs Or Egg-Derived Products Nausea And Vomiting  . Codeine Itching and Nausea And Vomiting    Patient is able to take morphine, demerol and fentanyl with out any type of side effects  Follow-up Information   Please follow up. (followup at 10am for paracentesis - please go to radiology department)        The results of significant diagnostics from this hospitalization (including imaging, microbiology, ancillary and laboratory) are listed below for reference.    Significant Diagnostic Studies: Ct Abdomen Pelvis W Contrast  06/06/2013   CLINICAL DATA:  Constipation, hepatitis-C, status postcholecystectomy, tubal ligation  EXAM: CT ABDOMEN AND PELVIS WITH CONTRAST  TECHNIQUE: Multidetector CT imaging of the abdomen and pelvis was performed using the standard protocol following bolus administration of intravenous contrast.  CONTRAST:  4mL OMNIPAQUE IOHEXOL 300 MG/ML SOLN, 159mL OMNIPAQUE IOHEXOL 300 MG/ML SOLN  COMPARISON:  03/19/2013  FINDINGS: Sagittal images of the spine shows mild degenerative changes. Significant disc space flattening with endplate sclerotic changes and vacuum disc phenomenon at L5-S1 level. Lung bases are unremarkable. There is abundant  abdominal and pelvic ascites. Again noted significant decreased attenuation of the liver consistent with fatty infiltration. The patient is status postcholecystectomy. Subtle nodular liver contour suspicious for cirrhosis. Clinical correlation is necessary. Recanalized umbilical vein again noted.  The spleen pancreas and adrenal glands are unremarkable. Kidneys are symmetrical in size and enhancement.  Oral contrast material was given to the patient. No small bowel obstruction. There is no colonic obstruction. The uterus is atrophic. Urinary bladder is unremarkable. Abundant pelvic ascites. Mild anasarca infiltration of subcutaneous fat lower abdomen and pelvic wall.  Delayed renal images shows bilateral renal symmetrical excretion. No aortic aneurysm. Bilateral visualized proximal ureter is unremarkable.  IMPRESSION: 1. There is abundant abdominal and pelvic ascites. Again noted significant fatty infiltration of the liver. Subtle nodular contour of the liver. Cirrhosis cannot be excluded. Clinical correlation is necessary. 2. No hydronephrosis or hydroureter. 3. No small bowel or colonic obstruction. 4. Bilateral renal symmetrical excretion. 5. Degenerative changes lumbar spine.   Electronically Signed   By: Lahoma Crocker M.D.   On: 06/06/2013 20:46   US Paracentesis  06/07/2013   CLINICAL DATA:  Ascites, question liver disease  EXAM: ULTRASOUND GUIDED DIAGNOSTIC AND THERAPEUTIC PARACENTESIS  COMPARISON:  None  PROCEDURE: Procedure, benefits, and risks of procedure were discussed with patient.  Written informed consent for procedure was obtained.  Time out protocol followed.  Adequate collection of ascites localized in right lower quadrant.  Skin prepped and draped in usual sterile fashion.  Skin and soft tissues anesthetized with 10 mL of 1% lidocaine.  5 [place] Yueh catheter placed into peritoneal cavity.  4000 mL of clear yellow fluid aspirated by vacuum bottle suction.  Aspiration limited to 4 L of fluid as  this is the patient's first paracentesis.  Procedure tolerated well by patient without immediate complication.  FINDINGS: A total of approximately 4000 mL of clear yellow fluid was removed. A fluid sample of 180 mL was sent for laboratory analysis.  IMPRESSION: Successful ultrasound guided paracentesis yielding 4000 mL of ascites.   Electronically Signed   By: Lavonia Dana M.D.   On: 06/07/2013 16:37   Dg Chest Portable 1 View  06/06/2013   CLINICAL DATA:  Cough, HX GUILLAIN-BARRE new  EXAM: PORTABLE CHEST - 1 VIEW  COMPARISON:  DG CHEST 1V PORT dated 04/22/2013  FINDINGS: Normal mediastinum and cardiac silhouette. Normal pulmonary vasculature. No evidence of effusion, infiltrate, or pneumothorax. No acute bony abnormality.  IMPRESSION: No acute cardiopulmonary process.   Electronically Signed   By: Suzy Bouchard M.D.   On: 06/06/2013 19:03    Microbiology: Recent Results (from the past 240  hour(s))  AFB CULTURE WITH SMEAR     Status: None   Collection Time    06/07/13 11:30 AM      Result Value Ref Range Status   Specimen Description FLUID ASCITIC   Final   Special Requests NONE   Final   ACID FAST SMEAR     Final   Value: NO ACID FAST BACILLI SEEN     Performed at [facility]   Culture     Final   Value: CULTURE WILL BE EXAMINED FOR 6 WEEKS BEFORE ISSUING A FINAL REPORT     Performed at [facility]   Report Status PENDING   Incomplete  BODY FLUID CULTURE     Status: None   Collection Time    06/07/13 11:30 AM      Result Value Ref Range Status   Specimen Description FLUID ASCITIC   Final   Special Requests NONE   Final   Gram Stain     Final   Value: WBC PRESENT,BOTH PMN AND MONONUCLEAR     NO ORGANISMS SEEN     Performed at [facility]   Culture     Final   Value: NO GROWTH 1 DAY     Performed at [facility]   Report Status PENDING   Incomplete     Labs: Basic Metabolic Panel:  Recent Labs Lab 06/06/13 1815 06/07/13 0704 06/08/13 0611   NA 134* 135* 137  K 4.2 3.2* 3.5*  CL 94* 90* 94*  CO2 34* 38* 39*  GLUCOSE 96 96 80  BUN 4* 4* 3*  CREATININE 0.52 0.57 0.58  CALCIUM 8.3* 8.3* 8.0*  MG  --   --  1.4*   Liver Function Tests:  Recent Labs Lab 06/06/13 1815 06/07/13 0704  AST 28 29  ALT 10 10  ALKPHOS 106 110  BILITOT 1.4* 1.6*  PROT 6.9 7.1  ALBUMIN 1.9* 2.0*   No results found for this basename: LIPASE, AMYLASE,  in the last 168 hours No results found for this basename: AMMONIA,  in the last 168 hours CBC:  Recent Labs Lab 06/06/13 1815 06/07/13 0704  WBC 10.6* 10.7*  NEUTROABS 8.0*  --   HGB 11.8* 11.5*  HCT 33.8* 33.8*  MCV 92.6 93.6  PLT 232 220   Cardiac Enzymes: No results found for this basename: CKTOTAL, CKMB, CKMBINDEX, TROPONINI,  in the last 168 hours BNP: BNP (last 3 results)  Recent Labs  04/22/13 1047  PROBNP 254.9*   CBG: No results found for this basename: GLUCAP,  in the last 168 hours     Signed:  Dia Crawford, MD [facility] [phone] pager

## 2013-06-08 NOTE — Plan of Care (Signed)
Pt spilled water on self and table.  I cleaned up water and offered new gown.  Pt said refused and said it would dry. Pt also offered heat pack for abdomen and help repositioning since meds are not due yet - pt refused.

## 2013-06-09 DIAGNOSIS — R5381 Other malaise: Secondary | ICD-10-CM

## 2013-06-09 DIAGNOSIS — R748 Abnormal levels of other serum enzymes: Secondary | ICD-10-CM

## 2013-06-09 DIAGNOSIS — K208 Other esophagitis without bleeding: Secondary | ICD-10-CM

## 2013-06-09 DIAGNOSIS — F411 Generalized anxiety disorder: Secondary | ICD-10-CM

## 2013-06-09 DIAGNOSIS — G610 Guillain-Barre syndrome: Secondary | ICD-10-CM

## 2013-06-09 DIAGNOSIS — R5383 Other fatigue: Secondary | ICD-10-CM

## 2013-06-09 DIAGNOSIS — I509 Heart failure, unspecified: Secondary | ICD-10-CM

## 2013-06-09 DIAGNOSIS — I5030 Unspecified diastolic (congestive) heart failure: Secondary | ICD-10-CM

## 2013-06-09 DIAGNOSIS — E876 Hypokalemia: Secondary | ICD-10-CM

## 2013-06-09 LAB — COMPREHENSIVE METABOLIC PANEL
ALK PHOS: 85 U/L (ref 39–117)
ALT: 8 U/L (ref 0–35)
AST: 22 U/L (ref 0–37)
Albumin: 1.8 g/dL — ABNORMAL LOW (ref 3.5–5.2)
BILIRUBIN TOTAL: 1.2 mg/dL (ref 0.3–1.2)
BUN: 3 mg/dL — ABNORMAL LOW (ref 6–23)
CHLORIDE: 91 meq/L — AB (ref 96–112)
CO2: 38 mEq/L — ABNORMAL HIGH (ref 19–32)
Calcium: 7.9 mg/dL — ABNORMAL LOW (ref 8.4–10.5)
Creatinine, Ser: 0.55 mg/dL (ref 0.50–1.10)
GFR calc Af Amer: >90 mL/min (ref >90)
GFR calc non Af Amer: >90 mL/min (ref >90)
Glucose, Bld: 85 mg/dL (ref 70–99)
POTASSIUM: 3 meq/L — AB (ref 3.7–5.3)
Sodium: 136 mEq/L — ABNORMAL LOW (ref 137–147)
Total Protein: 5.9 g/dL — ABNORMAL LOW (ref 6.0–8.3)

## 2013-06-09 LAB — CBC WITH DIFFERENTIAL/PLATELET
Basophils Absolute: 0 10*3/uL (ref 0.0–0.1)
Basophils Relative: 0 % (ref 0–1)
Eosinophils Absolute: 0.1 10*3/uL (ref 0.0–0.7)
Eosinophils Relative: 1 % (ref 0–5)
HEMATOCRIT: 27.9 % — AB (ref 36.0–46.0)
Hemoglobin: 9.7 g/dL — ABNORMAL LOW (ref 12.0–15.0)
Lymphocytes Relative: 24 % (ref 12–46)
Lymphs Abs: 1.6 10*3/uL (ref 0.7–4.0)
MCH: 31.9 pg (ref 26.0–34.0)
MCHC: 34.8 g/dL (ref 30.0–36.0)
MCV: 91.8 fL (ref 78.0–100.0)
MONO ABS: 0.6 10*3/uL (ref 0.1–1.0)
Monocytes Relative: 10 % (ref 3–12)
NEUTROS ABS: 4.3 10*3/uL (ref 1.7–7.7)
Neutrophils Relative %: 65 % (ref 43–77)
PLATELETS: 190 10*3/uL (ref 150–400)
RBC: 3.04 MIL/uL — ABNORMAL LOW (ref 3.87–5.11)
RDW: 15.5 % (ref 11.5–15.5)
WBC: 6.6 10*3/uL (ref 4.0–10.5)

## 2013-06-09 LAB — URINALYSIS, ROUTINE W REFLEX MICROSCOPIC
Bilirubin Urine: NEGATIVE
Glucose, UA: NEGATIVE mg/dL
Hgb urine dipstick: NEGATIVE
Ketones, ur: NEGATIVE mg/dL
LEUKOCYTES UA: NEGATIVE
Nitrite: NEGATIVE
Protein, ur: NEGATIVE mg/dL
Specific Gravity, Urine: <1.005 — ABNORMAL LOW (ref 1.005–1.030)
Urobilinogen, UA: 0.2 mg/dL (ref 0.0–1.0)
pH: 6 (ref 5.0–8.0)

## 2013-06-09 LAB — MAGNESIUM: MAGNESIUM: 1.3 mg/dL — AB (ref 1.5–2.5)

## 2013-06-09 MED ORDER — CHLORDIAZEPOXIDE HCL 5 MG PO CAPS
5.0000 mg | ORAL_CAPSULE | Freq: Two times a day (BID) | ORAL | Status: DC
  Administered 2013-06-09 – 2013-06-14 (×12): 5 mg via ORAL
  Filled 2013-06-09 (×12): qty 1

## 2013-06-09 MED ORDER — FUROSEMIDE 40 MG PO TABS
40.0000 mg | ORAL_TABLET | Freq: Every day | ORAL | Status: DC
  Administered 2013-06-10 – 2013-06-14 (×5): 40 mg via ORAL
  Filled 2013-06-09 (×6): qty 1

## 2013-06-09 MED ORDER — MAGNESIUM OXIDE 400 (241.3 MG) MG PO TABS
400.0000 mg | ORAL_TABLET | Freq: Every day | ORAL | Status: DC
  Administered 2013-06-09 – 2013-06-11 (×3): 400 mg via ORAL
  Filled 2013-06-09 (×3): qty 1

## 2013-06-09 MED ORDER — ALPRAZOLAM 1 MG PO TABS
1.0000 mg | ORAL_TABLET | Freq: Once | ORAL | Status: AC
  Administered 2013-06-09: 1 mg via ORAL
  Filled 2013-06-09: qty 1

## 2013-06-09 MED ORDER — POTASSIUM CHLORIDE CRYS ER 20 MEQ PO TBCR
40.0000 meq | EXTENDED_RELEASE_TABLET | Freq: Every day | ORAL | Status: DC
  Administered 2013-06-09 – 2013-06-11 (×3): 40 meq via ORAL
  Filled 2013-06-09 (×3): qty 2

## 2013-06-09 MED ORDER — BISACODYL 5 MG PO TBEC
5.0000 mg | DELAYED_RELEASE_TABLET | Freq: Once | ORAL | Status: AC
  Administered 2013-06-09: 5 mg via ORAL
  Filled 2013-06-09: qty 1

## 2013-06-09 NOTE — Progress Notes (Signed)
[facility] PROGRESS NOTE  [name] ZHY:[id number] DOB: March 16, 1961 DOA: 06/06/2013 PCP: Celedonio Savage, MD  Assessment/Plan:  Acute hypoxic respiratory failure.  - Stable.  -Secondary to massive ascites  -Patient greatly improved after paracentesis of 4 L removed on 2/24.  -States SOB greatly improved since removal of 4 L of fluid on 2/24 from abdomen  Massive ascites  - secondary to cirrhosis complicated by salt intake, and poor understanding of disease process  -Hepatitis C virus quantitative on 03/21/2013 negative, hepatitis B surface antigen 03/21/2013 negative   Left upper quadrant abdominal wall pain.  -Pain is in the subcutaneous tissue. Monitor clinically.  -No evidence of SBP.  -Continued left CVA/abdominal wall pain will rule out UTI, Discharge in the a.m. if stable   Hx alcohol abuse  -Per patient no longer consuming alcohol. Stopped approximately 12 months ago  Severe erosive reflux esophagitis/peptic ulcer disease/partial gastric outlet obstruction  - secondary to NSAID use, by EGD 12/014.  -Counseled to discontinue any NSAID use in the future  -Continue PPI.   Chronic constipation.  -Continue MiraLax   Hx seizure disorder (secondary to Lonia Blood)  -Not currently on any medication   Diastolic CHF  -Echocardiogram from 04/30/2012 suggestive of diastolic CHF  -Will continue Spironolactone, Lasix   Anxiety  -Chlordiazepoxide 5 mg twice a day  -Patient not doing well with her new diagnosis of liver failure/diastolic CHF will probably benefit from outpatient psychiatric care coping skills.       Code Status: Full Family Communication: None available Disposition Plan: Discharge in a.m.   Procedures:  Ultrasound guided paracentesis 06/07/2013  -4 L removed   CT abdomen pelvis with contrast 06/06/2013  1. There is abundant abdominal and pelvic ascites. Again noted  significant fatty infiltration of the liver. Subtle nodular contour  of the liver.  Cirrhosis cannot be excluded. Clinical correlation is  necessary.  2. No hydronephrosis or hydroureter.  3. No small bowel or colonic obstruction.  4. Bilateral renal symmetrical excretion.  5. Degenerative changes lumbar spine.   PCXR 06/06/2013  No acute cardiopulmonary process.   Echocardiogram 04/30/2012  -Left ventricle: The cavity size was normal. moderate concentric hypertrophy. Systolic function was normal.  -LVEF= 60% to 65%.  -(grade 1 diastolic dysfunction). The E/e' ratio is <10, suggesting normal LV filling pressure. - Left atrium: The atrium was at the upper limits of normal in size.    Consultations:  Dr. Carlyon Prows fields/NP [facility] Emperor (GI)    Antibiotics   HPI/Subjective: 53 yo W F PMHx seizure disorder, probable cirrhosis of the liver, anasarca, nausea vomiting, hepatitis C, hepatic steatosis, erosive esophagitis. Presents to the ED who has been admitted twice in the last 3 months, in December for severe erosive reflux esophagitis, peptic ulcer disease, acute blood loss anemia and suspected cirrhosis and in January of this year for generalized weakness, hypovolemia. She has hepatic steatosis and underlying liver disease of unclear etiology. She presented to the emergency department with increasing swelling and was found to have significant ascites. Admitted for planned GI consultation and paracentesis which was successfully completed 2/24 with removal of 4 L. Aldactone was added. Likely discharge next 48 hours. 2/25 patient has a poor understanding of why she was hospitalized. Believes it was just secondary to her having episode of hearing and understanding a person but not being able to answer correctly. Ascitic fluid NGTD/negative acid-fast bacilli. Patient states she feels like she is doing better. 2/26 patient extremely distraught over her new diagnosis of liver  failure/diastolic CHF. Patient complains of continued left-sided pain/left CVA tenderness, with burning during  urination.   Objective: Filed Vitals:   06/08/13 2300 06/09/13 0100 06/09/13 0500 06/09/13 0700  BP: 89/50 93/57 82/45  94/61  Pulse: 78  76   Temp:   98.2 F (36.8 C)   TempSrc:   Oral   Resp:   14   Height:      Weight:   96.1 kg (211 lb 13.8 oz)   SpO2:   96%     Intake/Output Summary (Last 24 hours) at 06/09/13 1202 Last data filed at 06/09/13 1109  Gross per 24 hour  Intake    470 ml  Output    900 ml  Net   -430 ml   Filed Weights   06/07/13 0637 06/08/13 0500 06/09/13 0500  Weight: 98.9 kg (218 lb 0.6 oz) 98.5 kg (217 lb 2.5 oz) 96.1 kg (211 lb 13.8 oz)    Exam:  General: A./O. X4, left lateral abdominal pain, left CVA tenderness  Cardiovascular: Regular rhythm and rate, negative murmurs rubs gallops  Respiratory: Good auscultation bilateral  Abdomen; moderate tenderness to palpation left lateral abdominal wall/CVA ,distended, plus bowel sounds  Psychiatric; poor coping skills with the diagnosis liver failure/CHF, tearful, overwhelmed with all the new information being provided concerning her illness. Extremely anxious   Data Reviewed: Basic Metabolic Panel:  Recent Labs Lab 06/06/13 1815 06/07/13 0704 06/08/13 0611 06/09/13 0636  NA 134* 135* 137 136*  K 4.2 3.2* 3.5* 3.0*  CL 94* 90* 94* 91*  CO2 34* 38* 39* 38*  GLUCOSE 96 96 80 85  BUN 4* 4* 3* 3*  CREATININE 0.52 0.57 0.58 0.55  CALCIUM 8.3* 8.3* 8.0* 7.9*  MG  --   --  1.4* 1.3*   Liver Function Tests:  Recent Labs Lab 06/06/13 1815 06/07/13 0704 06/09/13 0636  AST 28 29 22   ALT 10 10 8   ALKPHOS 106 110 85  BILITOT 1.4* 1.6* 1.2  PROT 6.9 7.1 5.9*  ALBUMIN 1.9* 2.0* 1.8*   No results found for this basename: LIPASE, AMYLASE,  in the last 168 hours No results found for this basename: AMMONIA,  in the last 168 hours CBC:  Recent Labs Lab 06/06/13 1815 06/07/13 0704 06/09/13 0636  WBC 10.6* 10.7* 6.6  NEUTROABS 8.0*  --  4.3  HGB 11.8* 11.5* 9.7*  HCT 33.8* 33.8* 27.9*  MCV  92.6 93.6 91.8  PLT 232 220 190   Cardiac Enzymes: No results found for this basename: CKTOTAL, CKMB, CKMBINDEX, TROPONINI,  in the last 168 hours BNP (last 3 results)  Recent Labs  04/22/13 1047  PROBNP 254.9*   CBG: No results found for this basename: GLUCAP,  in the last 168 hours  Recent Results (from the past 240 hour(s))  AFB CULTURE WITH SMEAR     Status: None   Collection Time    06/07/13 11:30 AM      Result Value Ref Range Status   Specimen Description FLUID ASCITIC   Final   Special Requests NONE   Final   ACID FAST SMEAR     Final   Value: NO ACID FAST BACILLI SEEN     Performed at [facility]   Culture     Final   Value: CULTURE WILL BE EXAMINED FOR 6 WEEKS BEFORE ISSUING A FINAL REPORT     Performed at [facility]   Report Status PENDING   Incomplete  BODY FLUID CULTURE  Status: None   Collection Time    06/07/13 11:30 AM      Result Value Ref Range Status   Specimen Description FLUID ASCITIC   Final   Special Requests NONE   Final   Gram Stain     Final   Value: WBC PRESENT,BOTH PMN AND MONONUCLEAR     NO ORGANISMS SEEN     Performed at [facility]   Culture     Final   Value: NO GROWTH 2 DAYS     Performed at [facility]   Report Status PENDING   Incomplete     Studies: No results found.  Scheduled Meds: . chlordiazePOXIDE  5 mg Oral BID  . cyanocobalamin  1,000 mcg Intramuscular Q30 days  . feeding supplement (ENSURE COMPLETE)  237 mL Oral BID BM  . folic acid  1 mg Oral Daily  . furosemide  40 mg Oral Daily  . levothyroxine  50 mcg Oral QAC breakfast  . magnesium oxide  400 mg Oral Daily  . pantoprazole  40 mg Oral BID AC  . polyethylene glycol  17 g Oral Daily  . potassium chloride  40 mEq Oral Daily  . pregabalin  100 mg Oral QHS  . pregabalin  50 mg Oral Daily  . sodium chloride  3 mL Intravenous Q12H  . spironolactone  100 mg Oral BID  . thiamine  100 mg Oral Daily   Continuous Infusions:    Principal Problem:   Acute respiratory failure with hypoxia Active Problems:   ETOH abuse   Seizure disorder   Nausea and vomiting in adult   Hepatitis C   PUD (peptic ulcer disease)   Anasarca   Mild diastolic dysfunction   Esophagitis, erosive   Hepatic steatosis   Morbid obesity   probable Cirrhosis of liver   Diastolic CHF    Time spent: 40 minute    WOODS, [place], J  [facility] Pager [phone]. If 7PM-7AM, please contact night-coverage at [email or web address], password [facility] 06/09/2013, 12:02 PM  LOS: 3 days

## 2013-06-09 NOTE — Plan of Care (Addendum)
Pt is very anxious and concerned at health, crying and wants to go home, but feels she's getting worse.  Attempted to console and educate, but pt asked me to leave her alone. Texting Dr. To see if we may get something for anxiety. Texted again [phone]), waiting on response.Pt requesting xanax.  Dr. Karalee Height was malfunctioning. Gave verbal for Xanax 1mg  PO one time dose.

## 2013-06-09 NOTE — Plan of Care (Signed)
Heart Failure packet given to pt and education begun.

## 2013-06-09 NOTE — Progress Notes (Signed)
Notified mid-level of patient's low blood pressure. Patient alert and oriented x 4.  Staff aware patient had recent paracentesis for fluid removal, and is on diuretics.  Received order for 500cc fluid bolus. Will continue to monitor patient.

## 2013-06-09 NOTE — Progress Notes (Signed)
Nutrition Education    Consult received for diet modification/recommendations with focus on liver disease.  Provided pt with handout Electronics engineer) and verbal education specifically related to cirrhosis and recommended diet modifications to support improved liver funtion.   She was encouraged to eat foods from all food groups, limit foods high in sugar, fat and salt. Emphasized adequate protein intake daily from lean non-processed sources.  RD contact information provided if she has further questions.  Colman Cater MS,RD,CSG,LDN Office: [phone] Pager: [phone]

## 2013-06-10 ENCOUNTER — Inpatient Hospital Stay (HOSPITAL_COMMUNITY): Payer: Medicare Other

## 2013-06-10 LAB — COMPREHENSIVE METABOLIC PANEL
ALK PHOS: 97 U/L (ref 39–117)
ALT: 10 U/L (ref 0–35)
AST: 25 U/L (ref 0–37)
Albumin: 1.9 g/dL — ABNORMAL LOW (ref 3.5–5.2)
BILIRUBIN TOTAL: 1.2 mg/dL (ref 0.3–1.2)
BUN: 4 mg/dL — ABNORMAL LOW (ref 6–23)
CHLORIDE: 89 meq/L — AB (ref 96–112)
CO2: 39 meq/L — AB (ref 19–32)
Calcium: 8.3 mg/dL — ABNORMAL LOW (ref 8.4–10.5)
Creatinine, Ser: 0.63 mg/dL (ref 0.50–1.10)
GLUCOSE: 84 mg/dL (ref 70–99)
Potassium: 3.1 mEq/L — ABNORMAL LOW (ref 3.7–5.3)
SODIUM: 135 meq/L — AB (ref 137–147)
Total Protein: 6.6 g/dL (ref 6.0–8.3)

## 2013-06-10 LAB — CBC WITH DIFFERENTIAL/PLATELET
Basophils Absolute: 0 10*3/uL (ref 0.0–0.1)
Basophils Relative: 0 % (ref 0–1)
EOS ABS: 0.1 10*3/uL (ref 0.0–0.7)
Eosinophils Relative: 1 % (ref 0–5)
HCT: 33.5 % — ABNORMAL LOW (ref 36.0–46.0)
HEMOGLOBIN: 11.2 g/dL — AB (ref 12.0–15.0)
LYMPHS ABS: 1.1 10*3/uL (ref 0.7–4.0)
Lymphocytes Relative: 16 % (ref 12–46)
MCH: 31.3 pg (ref 26.0–34.0)
MCHC: 33.4 g/dL (ref 30.0–36.0)
MCV: 93.6 fL (ref 78.0–100.0)
MONOS PCT: 10 % (ref 3–12)
Monocytes Absolute: 0.7 10*3/uL (ref 0.1–1.0)
NEUTROS PCT: 73 % (ref 43–77)
Neutro Abs: 5.1 10*3/uL (ref 1.7–7.7)
Platelets: 215 10*3/uL (ref 150–400)
RBC: 3.58 MIL/uL — ABNORMAL LOW (ref 3.87–5.11)
RDW: 16 % — ABNORMAL HIGH (ref 11.5–15.5)
WBC: 7.1 10*3/uL (ref 4.0–10.5)

## 2013-06-10 LAB — BODY FLUID CULTURE: Culture: NO GROWTH

## 2013-06-10 LAB — URINE CULTURE
Colony Count: NO GROWTH
Culture: NO GROWTH

## 2013-06-10 LAB — MAGNESIUM: Magnesium: 1.5 mg/dL (ref 1.5–2.5)

## 2013-06-10 MED ORDER — SPIRONOLACTONE 25 MG PO TABS
25.0000 mg | ORAL_TABLET | Freq: Two times a day (BID) | ORAL | Status: DC
  Administered 2013-06-10 – 2013-06-12 (×4): 25 mg via ORAL
  Filled 2013-06-10 (×4): qty 1

## 2013-06-10 MED ORDER — OXYCODONE HCL ER 10 MG PO T12A
10.0000 mg | EXTENDED_RELEASE_TABLET | Freq: Three times a day (TID) | ORAL | Status: DC
  Administered 2013-06-10 – 2013-06-14 (×13): 10 mg via ORAL
  Filled 2013-06-10 (×14): qty 1

## 2013-06-10 MED ORDER — OXYCODONE HCL 5 MG PO TABS
5.0000 mg | ORAL_TABLET | ORAL | Status: DC | PRN
  Administered 2013-06-10 – 2013-06-14 (×20): 5 mg via ORAL
  Filled 2013-06-10 (×21): qty 1

## 2013-06-10 MED ORDER — ALBUMIN HUMAN 25 % IV SOLN
INTRAVENOUS | Status: AC
  Administered 2013-06-10: 25 g via INTRAVENOUS
  Filled 2013-06-10: qty 100

## 2013-06-10 MED ORDER — LACTULOSE 10 GM/15ML PO SOLN
30.0000 g | Freq: Once | ORAL | Status: AC
  Administered 2013-06-10: 30 g via ORAL
  Filled 2013-06-10: qty 60

## 2013-06-10 MED ORDER — ALBUMIN HUMAN 25 % IV SOLN
25.0000 g | Freq: Three times a day (TID) | INTRAVENOUS | Status: AC
  Administered 2013-06-10 – 2013-06-11 (×3): 25 g via INTRAVENOUS
  Filled 2013-06-10 (×3): qty 100

## 2013-06-10 NOTE — Progress Notes (Signed)
Paracentesis complete no signs of distress. 5800 ml yellow colored abdominal fluid removed.

## 2013-06-10 NOTE — Evaluation (Signed)
Physical Therapy Evaluation Patient Details Name: [name] MRN: [id number] DOB: 10-11-1960 Today's Date: 06/10/2013 Time: 9242-6834 PT Time Calculation (min): 44 min  PT Assessment / Plan / Recommendation History of Present Illness  Pt is a 53 year old female admitted on 06-07-23 for respiratory failure secondary to severe ascites from cirrhosis.  She is well known to this therapist from multiple previous admissions due to a complicated hx.  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  She lives with her husband and her recent functional hx is not clear...she states that she was unable to walk but could transfer to a BSC.  Clinical Impression   Pt was seen for an evaluation.  She states that she feels awful all over, general malaise.  She is currently on 2 L O2 with stable O2 sat.  RN states that pt has been resistant to increasing her mobility and has been wanting to use a bedpan rather than try to transfer to a BSC.  Pt is clearly generally weak and deconditioned.  She is able to transfer bed to [facility] with mod assist.  I am not sure if she has the strength for gait at this time or not----pt tends to be emotionally volatile and often not highly motivated. She would qualify for SNF if she would like to pursue rehab but she has refused this in recent past.  Otherwise, we can resume HHPT at d/c.    PT Assessment  Patient needs continued PT services    Follow Up Recommendations  Home health PT;SNF;Other (comment) (per pt preference)               Equipment Recommendations  None recommended by PT       Frequency Min 3X/week    Precautions / Restrictions Precautions Precautions: Fall Restrictions Weight Bearing Restrictions: No         Mobility  Bed Mobility Overal bed mobility: Needs Assistance Bed Mobility: Supine to Sit Supine to sit: Min assist Transfers Overall transfer level: Needs assistance Equipment used:  None Transfers: Stand Pivot Transfers Stand pivot transfers: Mod assist General transfer comment: pt was able to use a walker to pivot transfer a 2nd time, again needing mod assist but was able to take small steps to complete the transfer         PT Diagnosis: Difficulty walking;Generalized weakness  PT Problem List: Decreased strength;Decreased activity tolerance;Decreased mobility;Decreased balance;Obesity;Pain PT Treatment Interventions: Gait training;Functional mobility training;Therapeutic exercise;Therapeutic activities     PT Goals(Current goals can be found in the care plan section) Acute Rehab PT Goals Patient Stated Goal: none stated PT Goal Formulation: With patient Time For Goal Achievement: 06/24/13 Potential to Achieve Goals: Fair  Visit Information  Last PT Received On: 06/10/13 History of Present Illness: Pt is a 53 year old female admitted on 06-07-23 for respiratory failure secondary to severe ascites from cirrhosis.  She is well known to this therapist from multiple previous admissions due to a complicated hx.  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  She lives with her husband and her recent functional hx is not clear...she states that she was unable to walk but could transfer to a BSC.       Prior [place] expects to be discharged to:: Private residence Living Arrangements: Spouse/significant other Available Help at Discharge: Family;Available 24 hours/day Type of Home: House Prior  Function Level of Independence: Needs assistance Gait / Transfers Assistance Needed: 2 months ago, pt was able to walk short distances with a walker and transfer independently but current status is not clear ADL's / Homemaking Assistance Needed: assist needed with bathing and dressing Communication Communication: No difficulties    Cognition  Cognition Arousal/Alertness:  Awake/alert Behavior During Therapy: WFL for tasks assessed/performed Overall Cognitive Status: Within Functional Limits for tasks assessed    Extremity/Trunk Assessment Lower Extremity Assessment Lower Extremity Assessment: Generalized weakness       End of Session PT - End of Session Equipment Utilized During Treatment: Gait belt Activity Tolerance: Patient limited by fatigue;Patient tolerated treatment well (is being transported to Radiology for paracentesis) Patient left: Other (comment) (to Radiology on stretcher) Nurse Communication: Mobility status  GP     Sable Feil 06/10/2013, 3:02 PM

## 2013-06-10 NOTE — Care Management Note (Addendum)
    Page 1 of 2   06/13/2013     4:48:01 PM   CARE MANAGEMENT NOTE 06/13/2013  Patient:  [name], [name]   Account Number:  [id number]  Date Initiated:  06/10/2013  Documentation initiated by:  Theophilus Kinds  Subjective/Objective Assessment:   Pt admitted from home with cirrhosis and ascities. Pt lives with her husband and will return home at discharge. Pt has a friend that stays with her during the day. Pt has a rollator for home use. Pt is active with [facility] for RN, PT, OT     Action/Plan:   Pt would like to resume HH with [facility] (per pts choice) at discharge. CM will arrange resumption of HH.   Anticipated DC Date:  06/13/2013   Anticipated DC Plan:  [place]  CM consult      [facility] Choice  Resumption Of Svcs/PTA Provider   Choice offered to / List presented to:  C-1 Patient        [place] arranged  HH-1 RN  [place].   Status of service:  Completed, signed off Medicare Important Message given?   (If response is "NO", the following Medicare IM given date fields will be blank) Date Medicare IM given:   Date Additional Medicare IM given:    Discharge Disposition:  [place]  Per UR Regulation:    If discussed at Long Length of Stay Meetings, dates discussed:    Comments:  06/13/13 Claretha Cooper RN BSN CM Active with [facility]  06/10/13 [place], RN BSN CM

## 2013-06-10 NOTE — Procedures (Signed)
PreOperative Dx: Ascites Postoperative Dx: Ascites Procedure:   US guided paracentesis Radiologist:  Thornton Papas Anesthesia:  10 ml of 1% lidocaine Specimen:  5800 ml of yellow ascitic fluid EBL:   < 1 ml Complications: None

## 2013-06-10 NOTE — Progress Notes (Signed)
[facility] PROGRESS NOTE Interim History: 53 year old woman who has been admitted twice in the last 3 months, in December for severe erosive reflux esophagitis, peptic ulcer disease, acute blood loss anemia and suspected cirrhosis and in January of this year for generalized weakness, hypovolemia. She has hepatic steatosis and underlying liver disease of unclear etiology. She presented to the emergency department with increasing swelling and was found to have significant ascites.  Assessment/Plan: Acute hypoxic respiratory failure.  - Stable.  - Secondary to massive ascites. - Patient greatly improved after paracentesis of 4 L removed on 2/24.   Recurrent Massive ascites/ Hypokalemia: - Secondary to cirrhosis complicated by salt intake, and poor understanding of disease process.paracentesis done 2.24.2015, now with massive [place] again will repeat. Give albumin. - Hepatitis C virus quantitative on 03/21/2013 negative, hepatitis B surface antigen 03/21/2013 negative. - Start aldactone.  Left upper quadrant abdominal wall pain.  - Pain is in the subcutaneous tissue. No rashes, no blister. - No evidence of SBP.   - Continued left CVA/abdominal wall pain will rule out UTI.  Hx alcohol abuse  -Per patient no longer consuming alcohol. Stopped approximately 12 months ago   Severe erosive reflux esophagitis/peptic ulcer disease/partial gastric outlet obstruction  - Secondary to NSAID use, by EGD 12/014.  - Counseled to discontinue any NSAID use in the future  - Continue PPI.   Chronic constipation.  -Continue MiraLax   Cirrhosis: - lactulose which will help with constipation, no asterixis. - aldactone needs to be increase as tolerate it.  Code Status: full Family Communication: none  Disposition Plan: inpateint   Consultants:  Gastroenterology  Procedures:  U/S2.24.2015  Antibiotics:  None  HPI/Subjective: Complaining of left side flank  pain  Objective: Filed Vitals:   06/09/13 1423 06/09/13 2215 06/10/13 0414 06/10/13 0607  BP: 157/113 94/60  87/52  Pulse: 103 82  73  Temp:  98 F (36.7 C)  98 F (36.7 C)  TempSrc:  Oral  Oral  Resp: 18 15  15   Height:      Weight:   94.3 kg (207 lb 14.3 oz)   SpO2: 98% 92%  92%    Intake/Output Summary (Last 24 hours) at 06/10/13 1114 Last data filed at 06/10/13 1047  Gross per 24 hour  Intake    720 ml  Output    800 ml  Net    -80 ml   Filed Weights   06/08/13 0500 06/09/13 0500 06/10/13 0414  Weight: 98.5 kg (217 lb 2.5 oz) 96.1 kg (211 lb 13.8 oz) 94.3 kg (207 lb 14.3 oz)    Exam:  General: Alert, awake, oriented x3, in no acute distress.  HEENT: No bruits, no goiter.  Heart: Regular rate and rhythm, without murmurs, rubs, gallops.  Lungs: Good air movement, bilateral air movement.  Abdomen: Soft, nontender, mild tenderness no rebound or guarding, positive bowel sounds.    Data Reviewed: Basic Metabolic Panel:  Recent Labs Lab 06/06/13 1815 06/07/13 0704 06/08/13 0611 06/09/13 0636 06/10/13 0614  NA 134* 135* 137 136* 135*  K 4.2 3.2* 3.5* 3.0* 3.1*  CL 94* 90* 94* 91* 89*  CO2 34* 38* 39* 38* 39*  GLUCOSE 96 96 80 85 84  BUN 4* 4* 3* 3* 4*  CREATININE 0.52 0.57 0.58 0.55 0.63  CALCIUM 8.3* 8.3* 8.0* 7.9* 8.3*  MG  --   --  1.4* 1.3* 1.5   Liver Function Tests:  Recent Labs Lab 06/06/13 1815 06/07/13 0704 06/09/13 0636  06/10/13 0614  AST 28 29 22 25   ALT 10 10 8 10   ALKPHOS 106 110 85 97  BILITOT 1.4* 1.6* 1.2 1.2  PROT 6.9 7.1 5.9* 6.6  ALBUMIN 1.9* 2.0* 1.8* 1.9*   No results found for this basename: LIPASE, AMYLASE,  in the last 168 hours No results found for this basename: AMMONIA,  in the last 168 hours CBC:  Recent Labs Lab 06/06/13 1815 06/07/13 0704 06/09/13 0636 06/10/13 0614  WBC 10.6* 10.7* 6.6 7.1  NEUTROABS 8.0*  --  4.3 5.1  HGB 11.8* 11.5* 9.7* 11.2*  HCT 33.8* 33.8* 27.9* 33.5*  MCV 92.6 93.6 91.8 93.6  PLT  232 220 190 215   Cardiac Enzymes: No results found for this basename: CKTOTAL, CKMB, CKMBINDEX, TROPONINI,  in the last 168 hours BNP (last 3 results)  Recent Labs  04/22/13 1047  PROBNP 254.9*   CBG: No results found for this basename: GLUCAP,  in the last 168 hours  Recent Results (from the past 240 hour(s))  AFB CULTURE WITH SMEAR     Status: None   Collection Time    06/07/13 11:30 AM      Result Value Ref Range Status   Specimen Description FLUID ASCITIC   Final   Special Requests NONE   Final   ACID FAST SMEAR     Final   Value: NO ACID FAST BACILLI SEEN     Performed at [facility]   Culture     Final   Value: CULTURE WILL BE EXAMINED FOR 6 WEEKS BEFORE ISSUING A FINAL REPORT     Performed at [facility]   Report Status PENDING   Incomplete  BODY FLUID CULTURE     Status: None   Collection Time    06/07/13 11:30 AM      Result Value Ref Range Status   Specimen Description FLUID ASCITIC   Final   Special Requests NONE   Final   Gram Stain     Final   Value: WBC PRESENT,BOTH PMN AND MONONUCLEAR     NO ORGANISMS SEEN     Performed at [facility]   Culture     Final   Value: NO GROWTH 3 DAYS     Performed at [facility]   Report Status 06/10/2013 FINAL   Final     Studies: No results found.  Scheduled Meds: . chlordiazePOXIDE  5 mg Oral BID  . cyanocobalamin  1,000 mcg Intramuscular Q30 days  . feeding supplement (ENSURE COMPLETE)  237 mL Oral BID BM  . folic acid  1 mg Oral Daily  . furosemide  40 mg Oral Daily  . levothyroxine  50 mcg Oral QAC breakfast  . magnesium oxide  400 mg Oral Daily  . pantoprazole  40 mg Oral BID AC  . polyethylene glycol  17 g Oral Daily  . potassium chloride  40 mEq Oral Daily  . pregabalin  100 mg Oral QHS  . pregabalin  50 mg Oral Daily  . sodium chloride  3 mL Intravenous Q12H  . spironolactone  100 mg Oral BID  . thiamine  100 mg Oral Daily   Continuous Infusions:    Charlynne Cousins  [facility] Pager [phone]. If 8PM-8AM, please contact night-coverage at [email or web address], password [facility] 06/10/2013, 11:14 AM  LOS: 4 days

## 2013-06-11 DIAGNOSIS — G8929 Other chronic pain: Secondary | ICD-10-CM

## 2013-06-11 LAB — CBC WITH DIFFERENTIAL/PLATELET
BASOS ABS: 0 10*3/uL (ref 0.0–0.1)
Basophils Relative: 1 % (ref 0–1)
EOS ABS: 0.1 10*3/uL (ref 0.0–0.7)
Eosinophils Relative: 1 % (ref 0–5)
HCT: 31.2 % — ABNORMAL LOW (ref 36.0–46.0)
HEMOGLOBIN: 10.6 g/dL — AB (ref 12.0–15.0)
Lymphocytes Relative: 18 % (ref 12–46)
Lymphs Abs: 1.4 10*3/uL (ref 0.7–4.0)
MCH: 31.6 pg (ref 26.0–34.0)
MCHC: 34 g/dL (ref 30.0–36.0)
MCV: 93.1 fL (ref 78.0–100.0)
Monocytes Absolute: 1 10*3/uL (ref 0.1–1.0)
Monocytes Relative: 12 % (ref 3–12)
NEUTROS ABS: 5.5 10*3/uL (ref 1.7–7.7)
NEUTROS PCT: 68 % (ref 43–77)
PLATELETS: 206 10*3/uL (ref 150–400)
RBC: 3.35 MIL/uL — ABNORMAL LOW (ref 3.87–5.11)
RDW: 16.1 % — AB (ref 11.5–15.5)
WBC: 8 10*3/uL (ref 4.0–10.5)

## 2013-06-11 LAB — COMPREHENSIVE METABOLIC PANEL
ALBUMIN: 2.6 g/dL — AB (ref 3.5–5.2)
ALK PHOS: 76 U/L (ref 39–117)
ALT: 8 U/L (ref 0–35)
AST: 23 U/L (ref 0–37)
BUN: 4 mg/dL — ABNORMAL LOW (ref 6–23)
CO2: 37 mEq/L — ABNORMAL HIGH (ref 19–32)
Calcium: 8.3 mg/dL — ABNORMAL LOW (ref 8.4–10.5)
Chloride: 92 mEq/L — ABNORMAL LOW (ref 96–112)
Creatinine, Ser: 0.62 mg/dL (ref 0.50–1.10)
GFR calc Af Amer: >90 mL/min (ref >90)
GFR calc non Af Amer: >90 mL/min (ref >90)
Glucose, Bld: 88 mg/dL (ref 70–99)
POTASSIUM: 3.5 meq/L — AB (ref 3.7–5.3)
SODIUM: 135 meq/L — AB (ref 137–147)
TOTAL PROTEIN: 5.8 g/dL — AB (ref 6.0–8.3)
Total Bilirubin: 1.4 mg/dL — ABNORMAL HIGH (ref 0.3–1.2)

## 2013-06-11 LAB — MAGNESIUM: MAGNESIUM: 1.5 mg/dL (ref 1.5–2.5)

## 2013-06-11 MED ORDER — POTASSIUM CHLORIDE CRYS ER 20 MEQ PO TBCR: 40.0000 meq | EXTENDED_RELEASE_TABLET | Freq: Once | ORAL | Status: DC

## 2013-06-11 MED ORDER — POTASSIUM CHLORIDE CRYS ER 20 MEQ PO TBCR
40.0000 meq | EXTENDED_RELEASE_TABLET | Freq: Every day | ORAL | Status: DC
  Administered 2013-06-11 – 2013-06-14 (×4): 40 meq via ORAL
  Filled 2013-06-11 (×4): qty 2

## 2013-06-11 MED ORDER — MAGNESIUM OXIDE 400 (241.3 MG) MG PO TABS
400.0000 mg | ORAL_TABLET | Freq: Two times a day (BID) | ORAL | Status: DC
  Administered 2013-06-11 – 2013-06-14 (×6): 400 mg via ORAL
  Filled 2013-06-11 (×6): qty 1

## 2013-06-11 NOTE — Progress Notes (Addendum)
[facility] PROGRESS NOTE  NELIAH DEBELLO [id number] DOB: 1961/04/06 DOA: 06/06/2013 PCP: Celedonio Savage, MD  Brief narrative:  53 year old woman who has been admitted twice in the last 3 months, in December for severe erosive reflux esophagitis, peptic ulcer disease, acute blood loss anemia and suspected cirrhosis and in January of this year for generalized weakness, hypovolemia. She has hepatic steatosis and underlying liver disease of unclear etiology. She presented to the emergency department with increasing swelling and was found to have significant ascites.   Assessment/Plan:   Principal Problem: Acute hypoxic respiratory failure.  - Secondary to massive ascites.  - Stable. Maintaining oxygen saturation at target range  - Patient greatly improved after paracentesis of 5800 cc removed on 2/27. Paracentesis also done 2/24 with 4 L volume removed  Active Problems: Recurrent Massive ascites/ Liver cirrhosis - Secondary to cirrhosis complicated by salt intake, poor insight of dz process.paracentesis done 06/07/2013 and 06/10/2013, total 9800 cc removed since that time  - Hepatitis C virus quantitative on 03/21/2013 negative, hepatitis B surface antigen 03/21/2013 negative.  -  Continue lasix 40 mg PO daily, spironolactone total 125 mg PO BID - continue protonix 40 mg daily - continue folic acid and thiamine Left upper quadrant abdominal wall pain.  - Pain is in the subcutaneous tissue. No rashes, no blister.  - No evidence of SBP.  Hx alcohol abuse  - Per patient no longer consuming alcohol. Stopped approximately 12 months ago  - continue folic acid and thiamine  - pt is also on Librium 5 mg PO BID Anemia of chronic disease - secondary to bone marrow suppression from alcohol abuse - hemoglobin 10.6 this am - no indications for transfusion Hypokalemia - secodnary to lasix - continue to supplement via PO route, K-dur 40 MEQ given this AM - Mg is also low so will continue to  supplement as well (magnesium oxide 400 mg PO BID) Hypothyroidism - continue levothyroxine 50 mcg daily  Severe erosive reflux esophagitis/peptic ulcer disease/partial gastric outlet obstruction  - Secondary to NSAID use, by EGD 12/014.  - Counseled to discontinue any NSAID use in the future  - Continue PPI therapy Chronic pain - continue oxycodone 10 mg PO TID - adjuvant therapy: lyrica Chronic constipation.  - Continue MiraLax  Severe protein calorie malnutrition  - secondary to liver cirrhosis - appreciate nutrition team assistance   Code Status: full  Family Communication: family not at the bedside this am Disposition Plan: home when stable  Consultants:  Gastroenterology Procedures:  U/S 2.24.2015 Paracentesis 2/27 --> 5800 cc fluid removed Antibiotics:  None  Leisa Lenz, MD  [facility] Pager [phone]  If 7PM-7AM, please contact night-coverage [email or web address] Password [facility] 06/11/2013, 10:24 AM   LOS: 5 days   HPI/Subjective: No events overnight.   Objective: Filed Vitals:   06/10/13 1613 06/10/13 2039 06/11/13 0500 06/11/13 0633  BP: 100/63 90/58  87/49  Pulse: 84 85  81  Temp:  98.1 F (36.7 C)  98.1 F (36.7 C)  TempSrc:  Oral  Oral  Resp: 16 16  16   Height:      Weight:   87.7 kg (193 lb 5.5 oz)   SpO2: 94% 93%  100%    Intake/Output Summary (Last 24 hours) at 06/11/13 1024 Last data filed at 06/11/13 [id number]  Gross per 24 hour  Intake    960 ml  Output   1100 ml  Net   -140 ml    Exam:   General:  Pt is  sleeping this am  Cardiovascular: Regular rate and rhythm, S1/S2 appreciated  Respiratory: Clear to auscultation bilaterally, no wheezing, no crackles, no rhonchi  Abdomen: Softly distended, bowel sounds present, no guarding  Extremities: Pulses DP and PT palpable bilaterally  Neuro: Grossly nonfocal  Data Reviewed: Basic Metabolic Panel:  Recent Labs Lab 06/07/13 0704 06/08/13 0611 06/09/13 0636 06/10/13 0614  06/11/13 0607  NA 135* 137 136* 135* 135*  K 3.2* 3.5* 3.0* 3.1* 3.5*  CL 90* 94* 91* 89* 92*  CO2 38* 39* 38* 39* 37*  GLUCOSE 96 80 85 84 88  BUN 4* 3* 3* 4* 4*  CREATININE 0.57 0.58 0.55 0.63 0.62  CALCIUM 8.3* 8.0* 7.9* 8.3* 8.3*  MG  --  1.4* 1.3* 1.5 1.5   Liver Function Tests:  Recent Labs Lab 06/06/13 1815 06/07/13 0704 06/09/13 0636 06/10/13 0614 06/11/13 0607  AST 28 29 22 25 23   ALT 10 10 8 10 8   ALKPHOS 106 110 85 97 76  BILITOT 1.4* 1.6* 1.2 1.2 1.4*  PROT 6.9 7.1 5.9* 6.6 5.8*  ALBUMIN 1.9* 2.0* 1.8* 1.9* 2.6*   CBC:  Recent Labs Lab 06/06/13 1815 06/07/13 0704 06/09/13 0636 06/10/13 0614 06/11/13 0607  WBC 10.6* 10.7* 6.6 7.1 8.0  NEUTROABS 8.0*  --  4.3 5.1 5.5  HGB 11.8* 11.5* 9.7* 11.2* 10.6*  HCT 33.8* 33.8* 27.9* 33.5* 31.2*  MCV 92.6 93.6 91.8 93.6 93.1  PLT 232 220 190 215 206    AFB CULTURE WITH SMEAR     Status: None   Collection Time    06/07/13 11:30 AM      Result Value Ref Range Status   Specimen Description FLUID ASCITIC   Final   Value: CULTURE WILL BE EXAMINED FOR 6 WEEKS BEFORE ISSUING A FINAL REPORT     Performed at [facility]   Report Status PENDING   Incomplete  BODY FLUID CULTURE     Status: None   Collection Time    06/07/13 11:30 AM      Result Value Ref Range Status   Specimen Description FLUID ASCITIC   Final   Value: NO GROWTH 3 DAYS     Performed at [facility]   Report Status 06/10/2013 FINAL   Final  URINE CULTURE     Status: None   Collection Time    06/09/13  2:34 PM      Result Value Ref Range Status   Specimen Description URINE, CLEAN CATCH   Final   Value: NO GROWTH     Performed at [facility]   Report Status 06/10/2013 FINAL   Final     Studies: US Paracentesis 06/10/2013  IMPRESSION: Successful ultrasound guided paracentesis yielding 5800 mL of ascites.   Electronically Signed   By: Lavonia Dana M.D.   On: 06/10/2013 17:05   Dg Abd 2 Views 06/10/2013    IMPRESSION:  No bowel obstruction.   Electronically Signed   By: Abelardo Diesel M.D.   On: 06/10/2013 15:09    Scheduled Meds: . chlordiazePOXIDE  5 mg Oral BID  . cyanocobalamin  1,000 mcg Intramuscular Q30 days  . feeding supplement  237 mL Oral BID BM  . folic acid  1 mg Oral Daily  . furosemide  40 mg Oral Daily  . levothyroxine  50 mcg Oral QAC breakfast  . magnesium oxide  400 mg Oral Daily  . OxyCODONE  10 mg Oral TID  . pantoprazole  40 mg Oral  BID AC  . polyethylene glycol  17 g Oral Daily  . potassium chloride  40 mEq Oral Daily  . pregabalin  100 mg Oral QHS  . pregabalin  50 mg Oral Daily  . spironolactone  100 mg Oral BID  . spironolactone  25 mg Oral BID  . thiamine  100 mg Oral Daily

## 2013-06-12 LAB — COMPREHENSIVE METABOLIC PANEL
ALT: 9 U/L (ref 0–35)
AST: 26 U/L (ref 0–37)
Albumin: 2.5 g/dL — ABNORMAL LOW (ref 3.5–5.2)
Alkaline Phosphatase: 90 U/L (ref 39–117)
BILIRUBIN TOTAL: 1 mg/dL (ref 0.3–1.2)
BUN: 5 mg/dL — ABNORMAL LOW (ref 6–23)
CALCIUM: 8.6 mg/dL (ref 8.4–10.5)
CO2: 33 meq/L — AB (ref 19–32)
CREATININE: 0.62 mg/dL (ref 0.50–1.10)
Chloride: 92 mEq/L — ABNORMAL LOW (ref 96–112)
GFR calc Af Amer: >90 mL/min (ref >90)
Glucose, Bld: 86 mg/dL (ref 70–99)
Potassium: 4.2 mEq/L (ref 3.7–5.3)
Sodium: 132 mEq/L — ABNORMAL LOW (ref 137–147)
Total Protein: 6.4 g/dL (ref 6.0–8.3)

## 2013-06-12 LAB — CBC
HEMATOCRIT: 32.8 % — AB (ref 36.0–46.0)
Hemoglobin: 11 g/dL — ABNORMAL LOW (ref 12.0–15.0)
MCH: 31.4 pg (ref 26.0–34.0)
MCHC: 33.5 g/dL (ref 30.0–36.0)
MCV: 93.7 fL (ref 78.0–100.0)
Platelets: 220 10*3/uL (ref 150–400)
RBC: 3.5 MIL/uL — ABNORMAL LOW (ref 3.87–5.11)
RDW: 16.3 % — AB (ref 11.5–15.5)
WBC: 8.3 10*3/uL (ref 4.0–10.5)

## 2013-06-12 LAB — MAGNESIUM: Magnesium: 1.9 mg/dL (ref 1.5–2.5)

## 2013-06-12 MED ORDER — LACTULOSE 10 GM/15ML PO SOLN
20.0000 g | Freq: Two times a day (BID) | ORAL | Status: DC
  Administered 2013-06-12 – 2013-06-14 (×5): 20 g via ORAL
  Filled 2013-06-12 (×6): qty 30

## 2013-06-12 MED ORDER — SPIRONOLACTONE 25 MG PO TABS
100.0000 mg | ORAL_TABLET | Freq: Two times a day (BID) | ORAL | Status: DC
  Administered 2013-06-12 – 2013-06-14 (×5): 100 mg via ORAL
  Filled 2013-06-12 (×5): qty 4

## 2013-06-12 NOTE — Progress Notes (Signed)
Patient complains of pain constantly and asks for pain medicine every hour even between her scheduled oxy and prn oxy she is unrelieved for more than an hour of pain. I explained that I can only give her what is due at the time she could have it and informed her of the risks of overdose. She states she does not think anything will help her pain, i expressed that i would tell the day shift nurse and maybe she could speak with her MD about other methods since this is the scheduled medicine she is on at home. Elmo Putt RN

## 2013-06-12 NOTE — Progress Notes (Addendum)
[facility] PROGRESS NOTE  [name] [id number] DOB: 1961/01/16 DOA: 06/06/2013 PCP: Celedonio Savage, MD  Brief narrative:  53 year old woman who has been admitted twice in the last 3 months, in December for severe erosive reflux esophagitis, peptic ulcer disease, acute blood loss anemia and suspected cirrhosis and in January of this year for generalized weakness, hypovolemia. She has hepatic steatosis and underlying liver disease of unclear etiology. She presented to the emergency department with increasing swelling and was found to have significant ascites.   Assessment/Plan:   Acute hypoxic respiratory failure.  - Secondary to massive ascites.  - Stable. Maintaining oxygen saturation at target range  - Patient greatly improved after paracentesis of 5800 cc removed on 2/27. Paracentesis also done 2/24 with 4 L volume removed  Recurrent Massive ascites/ Liver cirrhosis - Secondary to cirrhosis complicated by salt intake, poor insight of dz process.paracentesis done 06/07/2013 and 06/10/2013, total 9800 cc removed since that time  - Hepatitis C virus quantitative on 03/21/2013 negative, hepatitis B surface antigen 03/21/2013 negative.  -  Continue lasix 40 mg PO daily, spironolactone total 100 mg PO BID. Holder parameters for BP.  - continue protonix 40 mg daily - continue folic acid and thiamine   Hypotension; Asymptomatic. Holder parameter for diuretics.   Left upper quadrant abdominal wall pain.  - Pain is in the subcutaneous tissue. No rashes, no blister.  - No evidence of SBP.  -no Bowel movement for last couple of days. Will start lactulose.   Hx alcohol abuse  - Per patient no longer consuming alcohol. Stopped approximately 12 months ago  - continue folic acid and thiamine  - pt is also on Librium 5 mg PO BID  Anemia of chronic disease - secondary to bone marrow suppression from alcohol abuse - hemoglobin 11 this am - no indications for transfusion  Hypokalemia -  secodnary to lasix - continue to supplement via PO route, K-dur 40 MEQ.  - Mg is also low so will continue to supplement as well (magnesium oxide 400 mg PO BID)  Hypothyroidism - continue levothyroxine 50 mcg daily   Severe erosive reflux esophagitis/peptic ulcer disease/partial gastric outlet obstruction  - Secondary to NSAID use, by EGD 12/014.  - Counseled to discontinue any NSAID use in the future  - Continue PPI therapy  Chronic pain - continue oxycodone 10 mg PO TID - adjuvant therapy: lyrica  Chronic constipation.  - Continue MiraLax  -Start Lactulose.   Severe protein calorie malnutrition  - secondary to liver cirrhosis - appreciate nutrition team assistance   Code Status: full  Family Communication: family not at the bedside this am Disposition Plan: home when stable  Consultants:  Gastroenterology Procedures:  U/S 2.24.2015 Paracentesis 2/27 --> 5800 cc fluid removed Antibiotics:  None  Zebulen Simonis, MD  [facility] Pager [phone]  If 7PM-7AM, please contact night-coverage [email or web address] Password [facility] 06/12/2013, 1:57 PM   LOS: 6 days   HPI/Subjective: No events overnight. Still complaining of left side abdominal pain. No BM for last few days.   Objective: Filed Vitals:   06/11/13 [id number] 06/11/13 1609 06/11/13 2042 06/12/13 0514  BP: 87/49 82/58 83/47  81/51  Pulse: 81 81 79 78  Temp: 98.1 F (36.7 C) 98 F (36.7 C) 98.3 F (36.8 C) 97.8 F (36.6 C)  TempSrc: Oral Oral Oral Oral  Resp: 16 18 20 20   Height:      Weight:    88.8 kg (195 lb 12.3 oz)  SpO2: 100% 97% 96% 96%  Intake/Output Summary (Last 24 hours) at 06/12/13 1357 Last data filed at 06/12/13 0800  Gross per 24 hour  Intake    720 ml  Output   1000 ml  Net   -280 ml    Exam:   General:  Alert, awake.   Cardiovascular: Regular rate and rhythm, S1/S2 appreciated  Respiratory: Clear to auscultation bilaterally, no wheezing, no crackles, no rhonchi  Abdomen:  Softly distended, bowel sounds present, no guarding  Extremities: Pulses DP and PT palpable bilaterally  Neuro: Grossly nonfocal  Data Reviewed: Basic Metabolic Panel:  Recent Labs Lab 06/08/13 0611 06/09/13 0636 06/10/13 0614 06/11/13 0607 06/12/13 0514  NA 137 136* 135* 135* 132*  K 3.5* 3.0* 3.1* 3.5* 4.2  CL 94* 91* 89* 92* 92*  CO2 39* 38* 39* 37* 33*  GLUCOSE 80 85 84 88 86  BUN 3* 3* 4* 4* 5*  CREATININE 0.58 0.55 0.63 0.62 0.62  CALCIUM 8.0* 7.9* 8.3* 8.3* 8.6  MG 1.4* 1.3* 1.5 1.5 1.9   Liver Function Tests:  Recent Labs Lab 06/07/13 0704 06/09/13 0636 06/10/13 0614 06/11/13 0607 06/12/13 0514  AST 29 22 25 23 26   ALT 10 8 10 8 9   ALKPHOS 110 85 97 76 90  BILITOT 1.6* 1.2 1.2 1.4* 1.0  PROT 7.1 5.9* 6.6 5.8* 6.4  ALBUMIN 2.0* 1.8* 1.9* 2.6* 2.5*   CBC:  Recent Labs Lab 06/06/13 1815 06/07/13 0704 06/09/13 0636 06/10/13 0614 06/11/13 0607 06/12/13 0514  WBC 10.6* 10.7* 6.6 7.1 8.0 8.3  NEUTROABS 8.0*  --  4.3 5.1 5.5  --   HGB 11.8* 11.5* 9.7* 11.2* 10.6* 11.0*  HCT 33.8* 33.8* 27.9* 33.5* 31.2* 32.8*  MCV 92.6 93.6 91.8 93.6 93.1 93.7  PLT 232 220 190 215 206 220    AFB CULTURE WITH SMEAR     Status: None   Collection Time    06/07/13 11:30 AM      Result Value Ref Range Status   Specimen Description FLUID ASCITIC   Final   Value: CULTURE WILL BE EXAMINED FOR 6 WEEKS BEFORE ISSUING A FINAL REPORT     Performed at [facility]   Report Status PENDING   Incomplete  BODY FLUID CULTURE     Status: None   Collection Time    06/07/13 11:30 AM      Result Value Ref Range Status   Specimen Description FLUID ASCITIC   Final   Value: NO GROWTH 3 DAYS     Performed at [facility]   Report Status 06/10/2013 FINAL   Final  URINE CULTURE     Status: None   Collection Time    06/09/13  2:34 PM      Result Value Ref Range Status   Specimen Description URINE, CLEAN CATCH   Final   Value: NO GROWTH     Performed at [facility]   Report Status 06/10/2013 FINAL   Final     Studies: US Paracentesis 06/10/2013  IMPRESSION: Successful ultrasound guided paracentesis yielding 5800 mL of ascites.   Electronically Signed   By: Lavonia Dana M.D.   On: 06/10/2013 17:05   Dg Abd 2 Views 06/10/2013    IMPRESSION: No bowel obstruction.   Electronically Signed   By: Abelardo Diesel M.D.   On: 06/10/2013 15:09    Scheduled Meds: . chlordiazePOXIDE  5 mg Oral BID  . cyanocobalamin  1,000 mcg Intramuscular Q30 days  . feeding supplement  237  mL Oral BID BM  . folic acid  1 mg Oral Daily  . furosemide  40 mg Oral Daily  . levothyroxine  50 mcg Oral QAC breakfast  . magnesium oxide  400 mg Oral Daily  . OxyCODONE  10 mg Oral TID  . pantoprazole  40 mg Oral BID AC  . polyethylene glycol  17 g Oral Daily  . potassium chloride  40 mEq Oral Daily  . pregabalin  100 mg Oral QHS  . pregabalin  50 mg Oral Daily  . spironolactone  100 mg Oral BID  . spironolactone  25 mg Oral BID  . thiamine  100 mg Oral Daily

## 2013-06-13 DIAGNOSIS — K279 Peptic ulcer, site unspecified, unspecified as acute or chronic, without hemorrhage or perforation: Secondary | ICD-10-CM

## 2013-06-13 DIAGNOSIS — R188 Other ascites: Secondary | ICD-10-CM

## 2013-06-13 DIAGNOSIS — K7460 Unspecified cirrhosis of liver: Secondary | ICD-10-CM

## 2013-06-13 NOTE — Progress Notes (Signed)
PROGRESS NOTE  [name] VEH:[id number] DOB: February 01, 1961 DOA: 06/06/2013 PCP: Celedonio Savage, MD  Summary: 53 year old woman who has been admitted twice in the last 3 months, in December for severe erosive reflux esophagitis, peptic ulcer disease, acute blood loss anemia and suspected cirrhosis and in January of this year for generalized weakness, hypovolemia. She has hepatic steatosis and underlying liver disease of unclear etiology. She presented to the emergency department with increasing swelling and was found to have significant ascites. Admitted for planned GI consultation and paracentesis. Appears to be close to discharge.  Assessment/Plan:  1. Acute hypoxic respiratory failure. Resolved with paracentesis. Secondary to massive ascites.  Chest x-ray no acute abnormalities. 2. Massive ascites secondary to cirrhosis complicated by salt intake. 3. Left upper quadrant abdominal wall pain. CT of the abdomen and pelvis unremarkable. Pain is in the subcutaneous tissue. Monitor clinically. No rash. No evidence of SBP. 4. Alcoholic hepatitis, hepatic steatosis by CT and ultrasound imaging.  5. History of alcohol abuse 6. Severe erosive reflux esophagitis, peptic ulcer disease causing some element of partial gastric outlet obstruction secondary to NSAID use, by EGD 12/014. Continue PPI. 7. Chronic constipation. Bowel regimen. 8. 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 Legally blind   Consider repeat paracentesis 3/3  Has GI followup March 9 for EGD and colonoscopy  Anticipate home with RN, PT, OT 3/3  Lasix and Aldactone 100 mg BID  Protonix BID   Strict 2 gram Na diet   Code Status: full code DVT prophylaxis: SCDs Family Communication: Status with husband at bedside Disposition Plan:   Murray Hodgkins, MD  [facility]  Pager [phone] If 7PM-7AM, please contact night-coverage at [email or web address], password [facility] 06/13/2013, 2:40 PM  LOS: 7 days   Consultants:   Gastroenterology Physical therapy home health Procedures:  2/24 Large-volume paracentesis 4 L.  2/27 large-volume paracentesis 5.8 L  HPI/Subjective: Overall feeling better but still has fluid in her abdomen, would like to have another paracentesis. Eating fine. No other complaints.  Objective: Filed Vitals:   06/12/13 0514 06/12/13 1543 06/12/13 2147 06/13/13 0426  BP: 81/51 80/48 85/53  108/73  Pulse: 78 88 87 85  Temp: 97.8 F (36.6 C) 98.3 F (36.8 C) 98.1 F (36.7 C) 98.1 F (36.7 C)  TempSrc: Oral Oral Oral Oral  Resp: 20 20 20 20   Height:      Weight: 88.8 kg (195 lb 12.3 oz)   88.8 kg (195 lb 12.3 oz)  SpO2: 96% 96% 97% 99%    Intake/Output Summary (Last 24 hours) at 06/13/13 1440 Last data filed at 06/13/13 1122  Gross per 24 hour  Intake    240 ml  Output   2100 ml  Net  -1860 ml     Filed Weights   06/11/13 0500 06/12/13 0514 06/13/13 0426  Weight: 87.7 kg (193 lb 5.5 oz) 88.8 kg (195 lb 12.3 oz) 88.8 kg (195 lb 12.3 oz)    Exam:   Afebrile, vital signs stable. Nontoxic  Gen. Appears calm and comfortable. Speech fluent and clear.  Cardiovascular regular rate and rhythm. No murmur, rub or gallop.  Respiratory clear to auscultation bilaterally. No wheezes, rales or rhonchi. Normal respiratory effort.  Abdomen distended, nontender.  Data Reviewed:  Complete metabolic panel notable for sodium 132. Creatinine normal.  Hemoglobin 11.0.  Scheduled Meds: . chlordiazePOXIDE  5 mg Oral BID  . cyanocobalamin  1,000 mcg Intramuscular Q30 days  . feeding supplement (ENSURE COMPLETE)  237 mL Oral BID BM  . folic  acid  1 mg Oral Daily  . furosemide  40 mg Oral Daily  . lactulose  20 g Oral BID  . levothyroxine  50 mcg Oral QAC breakfast  . magnesium oxide  400 mg Oral BID  . OxyCODONE  10 mg Oral TID  . pantoprazole  40 mg Oral BID AC  . polyethylene glycol  17 g Oral Daily  . potassium chloride  40 mEq Oral Daily  . pregabalin  100 mg Oral QHS  .  pregabalin  50 mg Oral Daily  . sodium chloride  3 mL Intravenous Q12H  . spironolactone  100 mg Oral BID  . thiamine  100 mg Oral Daily   Continuous Infusions:   Principal Problem:   Acute respiratory failure with hypoxia Active Problems:   ETOH abuse   Seizure disorder   Nausea and vomiting in adult   Hepatitis C   PUD (peptic ulcer disease)   Anasarca   Mild diastolic dysfunction   Esophagitis, erosive   Hepatic steatosis   Morbid obesity   probable Cirrhosis of liver   Diastolic CHF   Time spent 25 minutes

## 2013-06-13 NOTE — Progress Notes (Signed)
Physical Therapy Treatment Patient Details Name: [name] MRN: [id number] DOB: 11-29-1960 Today's Date: 06/13/2013 Time: 0825-0905 PT Time Calculation (min): 40 min  PT Assessment / Plan / Recommendation  History of Present Illness     PT Comments   Patient displays improving bed mobility, improving transfers, and improving activity tolerance though patient continues to fatigue quickly with ambulating <70 ft with bilateral UE support with walker. Patient responds notes that she likes to do exercises in bed and responded well to sitting balance activities. Patients continues to display limited ability to reach outside base of support while sitting and standing though she was able to put on her shoe following sitting balance training. Current discharge plan remains appropriate.   Follow Up Recommendations  Home health PT;SNF;Other (comment)           Equipment Recommendations  None recommended by PT             Plan Current plan remains appropriate    Precautions / Restrictions Precautions Precautions: Fall Restrictions Weight Bearing Restrictions: No   Pertinent Vitals/Pain 9/10 chronic    Mobility  Bed Mobility Overal bed mobility: Needs Assistance Bed Mobility: Supine to Sit Supine to sit: Min guard General bed mobility comments: Patient requires min cuing and increased time with all bed mobility due to difficulty seeing, weakness, and pain.  Transfers Overall transfer level: Needs assistance Equipment used: Rolling walker (2 wheeled) Transfers: Sit to/from Stand Sit to Stand: Min assist General transfer comment: patient requires cuing for hand placement and sequencing for safety with transfers.  Ambulation/Gait Ambulation/Gait assistance: Min assist Ambulation Distance (Feet): 70 Feet Assistive device: Rolling walker (2 wheeled) Gait Pattern/deviations: Step-to pattern;Step-through pattern;Decreased stride length;Trunk flexed (decreased weight shift into LE and  increased need for UE support with increased distances due to fatigue. ) Gait velocity interpretation: Below normal speed for age/gender    Exercises General Exercises - Lower Extremity Ankle Circles/Pumps: AROM;Both;10 reps Heel Slides: AROM;Both;10 reps Other Exercises Other Exercises: Bridges, 10x, AROM Other Exercises: Standing heel raises, 10x, bilateral UE support with walker Other Exercises: standing marching, 10x , Bilateral UE support with walker    PT Goals (current goals can now be found in the care plan section)    Visit Information  Last PT Received On: 06/13/13 Assistance Needed: +1          Cognition  Cognition Arousal/Alertness: Awake/alert Behavior During Therapy: [facility] for tasks assessed/performed Overall Cognitive Status: Within Functional Limits for tasks assessed    Balance  Balance Overall balance assessment: Needs assistance;History of Falls Sitting-balance support: No upper extremity supported Sitting balance-Leahy Scale: Fair Sitting balance - Comments: patient was able to hndle min challenge following cuing and sitting balance activities.  Standing balance support: Bilateral upper extremity supported Standing balance-Leahy Scale: Poor Standing balance comment: patient displays significant utilization of UE for support with walker during standing balance.   End of Session PT - End of Session Equipment Utilized During Treatment: Gait belt Activity Tolerance: Patient limited by fatigue;Patient tolerated treatment well Patient left: in bed;with call bell/phone within reach;with bed alarm set   GP     Zakhi Dupre R 06/13/2013, 9:32 AM

## 2013-06-13 NOTE — Progress Notes (Signed)
Utilization Review Complete  

## 2013-06-13 NOTE — Progress Notes (Signed)
    Subjective: Mild nausea, no vomiting. Tearful. Just finished physical therapy. Feels she may need more fluid removed.   Objective: Vital signs in last 24 hours: Temp:  [98.1 F (36.7 C)-98.3 F (36.8 C)] 98.1 F (36.7 C) (03/02 0426) Pulse Rate:  [85-88] 85 (03/02 0426) Resp:  [20] 20 (03/02 0426) BP: (80-108)/(48-73) 108/73 mmHg (03/02 0426) SpO2:  [96 %-99 %] 99 % (03/02 0426) Weight:  [195 lb 12.3 oz (88.8 kg)] 195 lb 12.3 oz (88.8 kg) (03/02 0426) Last BM Date: 06/13/13 General:   Alert and oriented, anxious/tearful.  Head:  Normocephalic and atraumatic.  Heart:  S1, S2 present, no murmurs noted.  Lungs: Clear to auscultation bilaterally, without wheezing, rales, or rhonchi.  Abdomen:  Bowel sounds present, soft, non-tender, obese, non-tense ascites.  Extremities:  Without clubbing or edema. Neurologic:  Alert and  oriented x4;  grossly normal neurologically.   Intake/Output from previous day: 03/01 0701 - 03/02 0700 In: 720 [P.O.:720] Out: 1750 [Urine:1750] Intake/Output this shift:    Lab Results:  Recent Labs  06/11/13 0607 06/12/13 0514  WBC 8.0 8.3  HGB 10.6* 11.0*  HCT 31.2* 32.8*  PLT 206 220   BMET  Recent Labs  06/11/13 0607 06/12/13 0514  NA 135* 132*  K 3.5* 4.2  CL 92* 92*  CO2 37* 33*  GLUCOSE 88 86  BUN 4* 5*  CREATININE 0.62 0.62  CALCIUM 8.3* 8.6   LFT  Recent Labs  06/11/13 0607 06/12/13 0514  PROT 5.8* 6.4  ALBUMIN 2.6* 2.5*  AST 23 26  ALT 8 9  ALKPHOS 76 90  BILITOT 1.4* 1.0     Assessment: 53 year old female who presented with new-onset ascites/anasarca, with CT unable to exclude cirrhosis; likely multifactorial secondary to diuretic dosing and inadvertent high intake of salt, dietary non-compliance (campbell's soup), significant hypoalbuminemia. Prior work-up for elevated LFTs thorough with non-specific findings. Question secondary to fatty liver, hx of ETOH abuse, negative HCV RNA on file. Elevated ferritin in  the past non-specific in setting of acute illness. LVAP with 4 liters removed 2/24, with additional LVAP 2/27 yielding 585ml. Clinically improved from admission from a GI standpoint, on Lasix 40 mg daily and Aldactone 100 mg BID.    Abdominal pain chronic, with repeat EGD for PUD due mid March 2015. Also notable chronic constipation likely playing a role in chronic abdominal pain. Needs routine screening colonoscopy in future at time of EGD, as last was in remote past. Scant hematochezia likely benign in the setting of constipation.    Plan: No change in current regimen Stable from a GI standpoint for discharge Needs EGD/TCS as outpatient Follow-up with our office March 9th as previously scheduled. Will follow peripherally for now.  Orvil Feil, ANP-BC [facility]    LOS: 7 days    06/13/2013, 9:55 AM

## 2013-06-13 NOTE — Progress Notes (Signed)
NUTRITION FOLLOW UP  Intervention:   Continue with current nutrition plan of care  Nutrition Dx:   Inadequate oral intake related to early satiety as evidenced by PO: 50%; progressing  Goal:   Pt will meet >90% of estimated nutritional needs; progressing  Monitor:   PO intake, labs, skin assessments, weight changes, I/O's  Assessment:   Pt appetite is improving; noted PO: 50-75%. She continues on Ensure Complete po BID, which provides 700 kcal and 26 grams of protein. Wt hx reveals 12# (5.55) wt loss x 5 days, which is clinically significant, but likely due to fluid loss. Noted pt continues to diurese and also s/p paracentesis on 06/10/13, where 4 L of fluid was removed.  Pt has been educated on diet x 2 (2/25 and 2/26).  Discharge disposition is home with home health.   Height: Ht Readings from Last 1 Encounters:  06/06/13 5\' 4"  (1.626 m)    Weight Status:   Wt Readings from Last 1 Encounters:  06/13/13 195 lb 12.3 oz (88.8 kg)    Re-estimated needs:  Kcal: 1400-1500 daily Protein: 89-111 grams daily Fluid: 1.4-1.5 L daily  Skin: jaundice; ecchymosis  Diet Order: Sodium Restricted   Intake/Output Summary (Last 24 hours) at 06/13/13 1232 Last data filed at 06/13/13 1122  Gross per 24 hour  Intake    240 ml  Output   2100 ml  Net  -1860 ml    Last BM: 06/13/13   Labs:   Recent Labs Lab 06/10/13 0614 06/11/13 0607 06/12/13 0514  NA 135* 135* 132*  K 3.1* 3.5* 4.2  CL 89* 92* 92*  CO2 39* 37* 33*  BUN 4* 4* 5*  CREATININE 0.63 0.62 0.62  CALCIUM 8.3* 8.3* 8.6  MG 1.5 1.5 1.9  GLUCOSE 84 88 86    CBG (last 3)  No results found for this basename: GLUCAP,  in the last 72 hours  Scheduled Meds: . chlordiazePOXIDE  5 mg Oral BID  . cyanocobalamin  1,000 mcg Intramuscular Q30 days  . feeding supplement (ENSURE COMPLETE)  237 mL Oral BID BM  . folic acid  1 mg Oral Daily  . furosemide  40 mg Oral Daily  . lactulose  20 g Oral BID  . levothyroxine  50  mcg Oral QAC breakfast  . magnesium oxide  400 mg Oral BID  . OxyCODONE  10 mg Oral TID  . pantoprazole  40 mg Oral BID AC  . polyethylene glycol  17 g Oral Daily  . potassium chloride  40 mEq Oral Daily  . pregabalin  100 mg Oral QHS  . pregabalin  50 mg Oral Daily  . sodium chloride  3 mL Intravenous Q12H  . spironolactone  100 mg Oral BID  . thiamine  100 mg Oral Daily    Continuous Infusions:   [place] Gipe A. Jimmye Norman, RD, LDN Pager: [phone]

## 2013-06-14 ENCOUNTER — Inpatient Hospital Stay (HOSPITAL_COMMUNITY): Payer: Medicare Other

## 2013-06-14 ENCOUNTER — Encounter (HOSPITAL_COMMUNITY): Payer: Self-pay

## 2013-06-14 LAB — BASIC METABOLIC PANEL
BUN: 5 mg/dL — AB (ref 6–23)
CHLORIDE: 94 meq/L — AB (ref 96–112)
CO2: 34 mEq/L — ABNORMAL HIGH (ref 19–32)
Calcium: 8.9 mg/dL (ref 8.4–10.5)
Creatinine, Ser: 0.64 mg/dL (ref 0.50–1.10)
GFR calc Af Amer: >90 mL/min (ref >90)
GLUCOSE: 82 mg/dL (ref 70–99)
POTASSIUM: 4.7 meq/L (ref 3.7–5.3)
Sodium: 135 mEq/L — ABNORMAL LOW (ref 137–147)

## 2013-06-14 MED ORDER — LACTULOSE 10 GM/15ML PO SOLN: 20.0000 g | Freq: Two times a day (BID) | ORAL | Status: DC

## 2013-06-14 MED ORDER — FUROSEMIDE 40 MG PO TABS: 40.0000 mg | ORAL_TABLET | Freq: Two times a day (BID) | ORAL | Status: DC

## 2013-06-14 MED ORDER — SPIRONOLACTONE 100 MG PO TABS: 100.0000 mg | ORAL_TABLET | Freq: Two times a day (BID) | ORAL | Status: DC

## 2013-06-14 MED ORDER — ALBUMIN HUMAN 25 % IV SOLN
25.0000 g | Freq: Once | INTRAVENOUS | Status: AC
  Administered 2013-06-14: 25 g via INTRAVENOUS
  Filled 2013-06-14: qty 100

## 2013-06-14 NOTE — Procedures (Signed)
.  Procedure: Ultrasound guided paracentesis. Specimen: Ascites fluid to lab Bleeding: Minimal. Complications: None immediate. Patient   -Condition: Stable.  -Disposition:  Inpatient.  Full Radiology Report to Follow

## 2013-06-14 NOTE — Progress Notes (Signed)
Physician Discharge Summary  [name] [id number] DOB: 1960/07/21 DOA: 06/06/2013  PCP: Celedonio Savage, MD  Admit date: 06/06/2013 Discharge date: 06/14/2013  Time spent: 35 minutes  Recommendations for Outpatient Follow-up:  1. Recommend chem 12, CBC, INR in about a week 2. Patient has been scheduled outpatient paracentesis appointment at [place] [facility] on [address] her and her husband are aware 3. Patient has been counseled regarding salt intake as well as water intake 4. She should continue lactulose, Aldactone, Lasix 5. No controlled substances will be administered on discharge home  6. She should follow up with gastroenterology on March 9- will need interval EGD   7. I will make a copy of this note available to [facility] gastroenterology PA  Discharge Diagnoses:  Principal Problem:   Acute respiratory failure with hypoxia Active Problems:   ETOH abuse   Seizure disorder   Nausea and vomiting in adult   Hepatitis C   PUD (peptic ulcer disease)   Anasarca   Mild diastolic dysfunction   Esophagitis, erosive   Hepatic steatosis   Morbid obesity   probable Cirrhosis of liver   Diastolic CHF   Discharge Condition: fair  Diet recommendation:  Low-salt heart healthy  Filed Weights   06/12/13 0514 06/13/13 0426 06/14/13 0653  Weight: 88.8 kg (195 lb 12.3 oz) 88.8 kg (195 lb 12.3 oz) 86.9 kg (191 lb 9.3 oz)    History of present illness:  53 year old woman who has been admitted twice in the last 3 months, in December for severe erosive reflux esophagitis, peptic ulcer disease, acute blood loss anemia and suspected cirrhosis and in January of this year for generalized weakness, hypovolemia. She has hepatic steatosis and underlying liver disease of unclear etiology. She presented to the emergency department with increasing swelling and was found to have significant ascites. Admitted for planned GI consultation and paracentesis. Patient underwent 3 separate  paracentesis on 2/24, 2/27, 3/3 and gastroenterology followed the patient while in hospital Please see below for further details   Hospital Course:  1. Acute hypoxic respiratory failure. Resolved with paracentesis. Secondary to massive ascites. Chest x-ray no acute abnormalities. 2. Massive ascites secondary to cirrhosis complicated by salt intake-patient has been counseled regarding this 3. Left upper quadrant abdominal wall pain. CT of the abdomen and pelvis unremarkable. Pain is in the subcutaneous tissue. Monitor clinically. No rash. No evidence of SBP. 4. Alcoholic hepatitis, hepatic steatosis by CT and ultrasound imaging.  5. History of alcohol abuse 6. Severe erosive reflux esophagitis, peptic ulcer disease causing some element of partial gastric outlet obstruction secondary to NSAID use, by EGD 12/014. Continue PPI. 7. Chronic constipation. Bowel regimen. Could be exacerbated by pain management and chronic pain meds which were not prescribed on discharge 8. 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 Legally blind   Procedures:   Paracentesis multiple times   Consultations:  Gastroenterology  Radiology  Discharge Exam: Filed Vitals:   06/14/13 1619  BP: 96/70  Pulse: 85  Temp:   Resp: 18   Alert pleasant oriented Requesting paracentesis as feels full No pain Tolerating diet fairly   General: EOMI, NCAT, mildly icteric Cardiovascular: S1-S2 no murmur rub or gallop Respiratory: Clinically clear  Discharge Instructions  Discharge Orders   Future Appointments Provider Department Dept Phone   06/20/2013 11:00 AM Westly Pam [facility] [phone]   Future Orders Complete By Expires   Diet - low sodium heart healthy  As directed    Discharge instructions  As  directed    Comments:     You will be scheduled for a repeat paracentesis on discharge Follow up with GI Please eat a lot less salt, Less than 2 grams /day Please take all  your meds as scheduled   Increase activity slowly  As directed        Medication List         cyanocobalamin 1000 MCG/ML injection  Commonly known as:  (VITAMIN B-12)  Inject 1,000 mcg into the muscle every 30 (thirty) days. Sunday     cyclobenzaprine 10 MG tablet  Commonly known as:  FLEXERIL  Take 10 mg by mouth 3 (three) times daily.     folic acid 1 MG tablet  Commonly known as:  FOLVITE  Take 1 tablet (1 mg total) by mouth daily.     furosemide 40 MG tablet  Commonly known as:  LASIX  Take 1 tablet (40 mg total) by mouth 2 (two) times daily.     lactulose 10 GM/15ML solution  Commonly known as:  CHRONULAC  Take 30 mLs (20 g total) by mouth 2 (two) times daily.     levothyroxine 50 MCG tablet  Commonly known as:  SYNTHROID, LEVOTHROID  Take 50 mcg by mouth daily before breakfast.     ondansetron 4 MG tablet  Commonly known as:  ZOFRAN  Take 1 tablet (4 mg total) by mouth every 6 (six) hours as needed for nausea.     OxyCODONE 10 mg T12a 12 hr tablet  Commonly known as:  OXYCONTIN  Take 10 mg by mouth 3 (three) times daily.     pantoprazole 40 MG tablet  Commonly known as:  PROTONIX  Take 1 tablet (40 mg total) by mouth 2 (two) times daily before a meal.     potassium chloride SA 20 MEQ tablet  Commonly known as:  K-DUR,KLOR-CON  Take 1 tablet (20 mEq total) by mouth daily.     pregabalin 50 MG capsule  Commonly known as:  LYRICA  Take 50-100 mg by mouth 2 (two) times daily. 1 in the morning and 2 at bedtime     pregabalin 100 MG capsule  Commonly known as:  LYRICA  Take 100-200 mg by mouth 2 (two) times daily. 1 in the morning and 2 at bedtime     spironolactone 100 MG tablet  Commonly known as:  ALDACTONE  Take 1 tablet (100 mg total) by mouth 2 (two) times daily.     thiamine 100 MG tablet  Take 1 tablet (100 mg total) by mouth daily.       Allergies  Allergen Reactions  . Neurontin [Gabapentin] Anaphylaxis  . Acetaminophen Itching and Nausea  And Vomiting  . Eggs Or Egg-Derived Products Nausea And Vomiting  . Codeine Itching and Nausea And Vomiting    Patient is able to take morphine, demerol and fentanyl with out any type of side effects       Follow-up Information   Please follow up. (followup at 10am for paracentesis - please go to radiology department)        The results of significant diagnostics from this hospitalization (including imaging, microbiology, ancillary and laboratory) are listed below for reference.    Significant Diagnostic Studies: Ct Abdomen Pelvis W Contrast  06/06/2013   CLINICAL DATA:  Constipation, hepatitis-C, status postcholecystectomy, tubal ligation  EXAM: CT ABDOMEN AND PELVIS WITH CONTRAST  TECHNIQUE: Multidetector CT imaging of the abdomen and pelvis was performed using the standard protocol following bolus  administration of intravenous contrast.  CONTRAST:  21mL OMNIPAQUE IOHEXOL 300 MG/ML SOLN, 162mL OMNIPAQUE IOHEXOL 300 MG/ML SOLN  COMPARISON:  03/19/2013  FINDINGS: Sagittal images of the spine shows mild degenerative changes. Significant disc space flattening with endplate sclerotic changes and vacuum disc phenomenon at L5-S1 level. Lung bases are unremarkable. There is abundant abdominal and pelvic ascites. Again noted significant decreased attenuation of the liver consistent with fatty infiltration. The patient is status postcholecystectomy. Subtle nodular liver contour suspicious for cirrhosis. Clinical correlation is necessary. Recanalized umbilical vein again noted.  The spleen pancreas and adrenal glands are unremarkable. Kidneys are symmetrical in size and enhancement.  Oral contrast material was given to the patient. No small bowel obstruction. There is no colonic obstruction. The uterus is atrophic. Urinary bladder is unremarkable. Abundant pelvic ascites. Mild anasarca infiltration of subcutaneous fat lower abdomen and pelvic wall.  Delayed renal images shows bilateral renal symmetrical  excretion. No aortic aneurysm. Bilateral visualized proximal ureter is unremarkable.  IMPRESSION: 1. There is abundant abdominal and pelvic ascites. Again noted significant fatty infiltration of the liver. Subtle nodular contour of the liver. Cirrhosis cannot be excluded. Clinical correlation is necessary. 2. No hydronephrosis or hydroureter. 3. No small bowel or colonic obstruction. 4. Bilateral renal symmetrical excretion. 5. Degenerative changes lumbar spine.   Electronically Signed   By: Lahoma Crocker M.D.   On: 06/06/2013 20:46   US Paracentesis  06/14/2013   CLINICAL DATA:  53 year old female with ascites. Hepatitis-C. Abdominal pain. Diagnostic and therapeutic paracentesis requested.  EXAM: ULTRASOUND GUIDED diagnostic and therapeutic PARACENTESIS  COMPARISON:  06/10/2013, 06/07/2013, CT Abdomen and Pelvis 06/06/2013.  PROCEDURE: An ultrasound guided paracentesis was thoroughly discussed with the patient and questions answered. The benefits, risks, alternatives and complications were also discussed. The patient understands and wishes to proceed with the procedure. Written consent was obtained.  Ultrasound was performed to localize and mark an adequate pocket of fluid in the right lower quadrant of the abdomen. The area was then prepped and draped in the normal sterile fashion. 1% Lidocaine was used for local anesthesia. Under ultrasound guidance a 19 gauge Yueh catheter was introduced. Paracentesis was performed. The catheter was removed and a dressing applied.  Complications: None.  FINDINGS: A total of approximately 3200 mL of yellowish fluid was removed. A fluid sample was sent for the requested laboratory analysis.  IMPRESSION: Successful ultrasound guided paracentesis yielding 3.2 L of ascites.   Electronically Signed   By: Lars Pinks M.D.   On: 06/14/2013 16:47   US Paracentesis  06/10/2013   CLINICAL DATA:  Ascites  EXAM: ULTRASOUND GUIDED THERAPEUTIC PARACENTESIS  COMPARISON:  06/07/2013  PROCEDURE:  Procedure, benefits, and risks of procedure were discussed with patient.  Written informed consent for procedure was obtained.  Time out protocol followed.  Adequate collection of ascites localized in right lower quadrant.  Skin prepped and draped in usual sterile fashion.  Skin and soft tissues anesthetized with 10 mL of 1% lidocaine.  5 [place] Yueh catheter placed into peritoneal cavity.  5800 mL of clear yellow fluid aspirated by vacuum bottle suction.  Procedure tolerated well by patient without immediate complication.  FINDINGS: As above  IMPRESSION: Successful ultrasound guided paracentesis yielding 5800 mL of ascites.   Electronically Signed   By: Lavonia Dana M.D.   On: 06/10/2013 17:05   US Paracentesis  06/07/2013   CLINICAL DATA:  Ascites, question liver disease  EXAM: ULTRASOUND GUIDED DIAGNOSTIC AND THERAPEUTIC PARACENTESIS  COMPARISON:  None  PROCEDURE:  Procedure, benefits, and risks of procedure were discussed with patient.  Written informed consent for procedure was obtained.  Time out protocol followed.  Adequate collection of ascites localized in right lower quadrant.  Skin prepped and draped in usual sterile fashion.  Skin and soft tissues anesthetized with 10 mL of 1% lidocaine.  5 [place] Yueh catheter placed into peritoneal cavity.  4000 mL of clear yellow fluid aspirated by vacuum bottle suction.  Aspiration limited to 4 L of fluid as this is the patient's first paracentesis.  Procedure tolerated well by patient without immediate complication.  FINDINGS: A total of approximately 4000 mL of clear yellow fluid was removed. A fluid sample of 180 mL was sent for laboratory analysis.  IMPRESSION: Successful ultrasound guided paracentesis yielding 4000 mL of ascites.   Electronically Signed   By: Lavonia Dana M.D.   On: 06/07/2013 16:37   Dg Chest Portable 1 View  06/06/2013   CLINICAL DATA:  Cough, HX GUILLAIN-BARRE new  EXAM: PORTABLE CHEST - 1 VIEW  COMPARISON:  DG CHEST 1V PORT dated 04/22/2013   FINDINGS: Normal mediastinum and cardiac silhouette. Normal pulmonary vasculature. No evidence of effusion, infiltrate, or pneumothorax. No acute bony abnormality.  IMPRESSION: No acute cardiopulmonary process.   Electronically Signed   By: Suzy Bouchard M.D.   On: 06/06/2013 19:03   Dg Abd 2 Views  06/10/2013   CLINICAL DATA:  Left abdomen pain, vomiting, constipation.  EXAM: ABDOMEN - 2 VIEW  COMPARISON:  None.  FINDINGS: The bowel gas pattern is normal. There is no evidence of free air. No radio-opaque calculi or other significant radiographic abnormality is seen. Patient is status post prior cholecystectomy. Pelvic phleboliths are noted.  IMPRESSION: No bowel obstruction.   Electronically Signed   By: Abelardo Diesel M.D.   On: 06/10/2013 15:09    Microbiology: Recent Results (from the past 240 hour(s))  AFB CULTURE WITH SMEAR     Status: None   Collection Time    06/07/13 11:30 AM      Result Value Ref Range Status   Specimen Description FLUID ASCITIC   Final   Special Requests NONE   Final   ACID FAST SMEAR     Final   Value: NO ACID FAST BACILLI SEEN     Performed at [facility]   Culture     Final   Value: CULTURE WILL BE EXAMINED FOR 6 WEEKS BEFORE ISSUING A FINAL REPORT     Performed at [facility]   Report Status PENDING   Incomplete  BODY FLUID CULTURE     Status: None   Collection Time    06/07/13 11:30 AM      Result Value Ref Range Status   Specimen Description FLUID ASCITIC   Final   Special Requests NONE   Final   Gram Stain     Final   Value: WBC PRESENT,BOTH PMN AND MONONUCLEAR     NO ORGANISMS SEEN     Performed at [facility]   Culture     Final   Value: NO GROWTH 3 DAYS     Performed at [facility]   Report Status 06/10/2013 FINAL   Final  URINE CULTURE     Status: None   Collection Time    06/09/13  2:34 PM      Result Value Ref Range Status   Specimen Description URINE, CLEAN CATCH   Final   Special Requests NONE  Final   Culture  Setup Time     Final   Value: 06/10/2013 00:26     Performed at [facility]     Final   Value: NO GROWTH     Performed at [facility]   Culture     Final   Value: NO GROWTH     Performed at [facility]   Report Status 06/10/2013 FINAL   Final     Labs: Basic Metabolic Panel:  Recent Labs Lab 06/08/13 [id number] 06/09/13 0636 06/10/13 0614 06/11/13 0607 06/12/13 0514 06/14/13 0550  NA 137 136* 135* 135* 132* 135*  K 3.5* 3.0* 3.1* 3.5* 4.2 4.7  CL 94* 91* 89* 92* 92* 94*  CO2 39* 38* 39* 37* 33* 34*  GLUCOSE 80 85 84 88 86 82  BUN 3* 3* 4* 4* 5* 5*  CREATININE 0.58 0.55 0.63 0.62 0.62 0.64  CALCIUM 8.0* 7.9* 8.3* 8.3* 8.6 8.9  MG 1.4* 1.3* 1.5 1.5 1.9  --    Liver Function Tests:  Recent Labs Lab 06/09/13 0636 06/10/13 0614 06/11/13 0607 06/12/13 0514  AST 22 25 23 26   ALT 8 10 8 9   ALKPHOS 85 97 76 90  BILITOT 1.2 1.2 1.4* 1.0  PROT 5.9* 6.6 5.8* 6.4  ALBUMIN 1.8* 1.9* 2.6* 2.5*   No results found for this basename: LIPASE, AMYLASE,  in the last 168 hours No results found for this basename: AMMONIA,  in the last 168 hours CBC:  Recent Labs Lab 06/09/13 0636 06/10/13 0614 06/11/13 0607 06/12/13 0514  WBC 6.6 7.1 8.0 8.3  NEUTROABS 4.3 5.1 5.5  --   HGB 9.7* 11.2* 10.6* 11.0*  HCT 27.9* 33.5* 31.2* 32.8*  MCV 91.8 93.6 93.1 93.7  PLT 190 215 206 220   Cardiac Enzymes: No results found for this basename: CKTOTAL, CKMB, CKMBINDEX, TROPONINI,  in the last 168 hours BNP: BNP (last 3 results)  Recent Labs  04/22/13 1047  PROBNP 254.9*   CBG: No results found for this basename: GLUCAP,  in the last 168 hours     Signed:  Nita Sells  [facility] 06/14/2013, 6:29 PM

## 2013-06-14 NOTE — Progress Notes (Signed)
Paracentesis complete 3200 ml yellow colored abdominal fluid removed. No signs of distress

## 2013-06-14 NOTE — Progress Notes (Signed)
Nutrition Brief Note  Received MD consult for diet education. Noted that pt has been educated on diet twice by RD previously during this admission (06/08/13 and 06/09/13). Attempted education again multiple times today, but pt was occupied at times of visits.   Stepfanie Yott A. Jimmye Norman, RD, LDN Pager: [phone]

## 2013-06-16 ENCOUNTER — Other Ambulatory Visit (HOSPITAL_COMMUNITY): Payer: Self-pay | Admitting: Family Medicine

## 2013-06-16 DIAGNOSIS — R188 Other ascites: Secondary | ICD-10-CM

## 2013-06-20 ENCOUNTER — Other Ambulatory Visit (HOSPITAL_COMMUNITY): Payer: Self-pay | Admitting: Family Medicine

## 2013-06-20 ENCOUNTER — Ambulatory Visit (HOSPITAL_COMMUNITY): Payer: Medicare Other

## 2013-06-20 ENCOUNTER — Encounter: Payer: Self-pay | Admitting: Gastroenterology

## 2013-06-20 ENCOUNTER — Ambulatory Visit (HOSPITAL_COMMUNITY)
Admission: RE | Admit: 2013-06-20 | Discharge: 2013-06-20 | Disposition: A | Discharge status: Home / Self Care | Payer: Medicare Other | Source: Ambulatory Visit | Attending: Family Medicine | Admitting: Family Medicine

## 2013-06-20 ENCOUNTER — Encounter (HOSPITAL_COMMUNITY): Payer: Self-pay | Admitting: Pharmacy Technician

## 2013-06-20 ENCOUNTER — Ambulatory Visit (INDEPENDENT_AMBULATORY_CARE_PROVIDER_SITE_OTHER): Payer: Medicare Other | Admitting: Gastroenterology

## 2013-06-20 VITALS — BP 96/64 | HR 99 | Temp 97.9°F | Ht 64.0 in | Wt 176.0 lb

## 2013-06-20 DIAGNOSIS — K625 Hemorrhage of anus and rectum: Secondary | ICD-10-CM

## 2013-06-20 DIAGNOSIS — R188 Other ascites: Secondary | ICD-10-CM | POA: Insufficient documentation

## 2013-06-20 DIAGNOSIS — K7460 Unspecified cirrhosis of liver: Secondary | ICD-10-CM

## 2013-06-20 DIAGNOSIS — R609 Edema, unspecified: Secondary | ICD-10-CM

## 2013-06-20 DIAGNOSIS — K5900 Constipation, unspecified: Secondary | ICD-10-CM | POA: Insufficient documentation

## 2013-06-20 DIAGNOSIS — R109 Unspecified abdominal pain: Secondary | ICD-10-CM

## 2013-06-20 DIAGNOSIS — K279 Peptic ulcer, site unspecified, unspecified as acute or chronic, without hemorrhage or perforation: Secondary | ICD-10-CM

## 2013-06-20 DIAGNOSIS — R601 Generalized edema: Secondary | ICD-10-CM

## 2013-06-20 MED ORDER — PEG 3350-KCL-NA BICARB-NACL 420 G PO SOLR: 4000.0000 mL | ORAL | Status: DC

## 2013-06-20 MED ORDER — LACTULOSE 10 GM/15ML PO SOLN: 20.0000 g | Freq: Two times a day (BID) | ORAL | Status: DC

## 2013-06-20 NOTE — Progress Notes (Signed)
Primary Care Physician: Celedonio Savage, MD  Primary Gastroenterologist:  Garfield Cornea, MD   Chief Complaint  Patient presents with  . Follow-up    HPI: [name] is a 53 y.o. female here for hospital follow-up. Two recent admissions. She has h/o EGD in 03/2013 with severe ERE, PUD secondary to NSAIDs with likely partial GOO, acute blood loss anemia, ?cirrhosis with elevated LFTs. Liver w/u for PBC, autoimmune unremarkable. Her IgG/IgA were elevated and antismooth muscle Ab weakly positive, unclear signficance. H/o HCV but RNA undetectable. Admission last month for new onset ascites/anasarca (3 LVAP since 05/2013), significant hypoalbuminemia. Also notable chronic constipation likely playing a role in chronic abdominal pain. Needs routine screening colonoscopy in future at time of EGD, as last was in remote past. Scant hematochezia likely benign in the setting of constipation.    Patient complains of ongoing constipation. Hemorrhoids flare with straining. Taking lactulose two teaspoons two times per day. On Miralax and Senna. Has tried to improve diet. No carbonated beverage. Drinking protein drinks. Some heartburn sometimes. No melena. Some brbpr. Pain on left side better when fluid draw off. No vomiting. Continues to have lower extremity edema but much improved.  States she is scheduled for LVAP today.  Current Outpatient Prescriptions  Medication Sig Dispense Refill  . cyanocobalamin (,VITAMIN B-12,) 1000 MCG/ML injection Inject 1,000 mcg into the muscle every 30 (thirty) days. Sunday      . desvenlafaxine (PRISTIQ) 50 MG 24 hr tablet Take 50 mg by mouth daily.      . folic acid (FOLVITE) 1 MG tablet Take 1 tablet (1 mg total) by mouth daily.      . furosemide (LASIX) 40 MG tablet Take 1 tablet (40 mg total) by mouth 2 (two) times daily.  60 tablet  0  . lactulose (CHRONULAC) 10 GM/15ML solution Take 30 mLs (20 g total) by mouth 2 (two) times daily.  240 mL  0  . levothyroxine  (SYNTHROID, LEVOTHROID) 50 MCG tablet Take 50 mcg by mouth daily before breakfast.      . ondansetron (ZOFRAN) 4 MG tablet Take 1 tablet (4 mg total) by mouth every 6 (six) hours as needed for nausea.  20 tablet  0  . OxyCODONE (OXYCONTIN) 10 mg T12A 12 hr tablet Take 10 mg by mouth 3 (three) times daily.      . pantoprazole (PROTONIX) 40 MG tablet Take 1 tablet (40 mg total) by mouth 2 (two) times daily before a meal.  60 tablet  0  . polyethylene glycol (MIRALAX / GLYCOLAX) packet Take 17 g by mouth daily.      . potassium chloride SA (K-DUR,KLOR-CON) 20 MEQ tablet Take 1 tablet (20 mEq total) by mouth daily.      . pregabalin (LYRICA) 100 MG capsule Take 100-200 mg by mouth 2 (two) times daily. 1 in the morning and 2 at bedtime      . pregabalin (LYRICA) 50 MG capsule Take 100 mg by mouth 2 (two) times daily. 1 in the morning and 2 at bedtime      . sennosides-docusate sodium (SENOKOT-S) 8.6-50 MG tablet Take 2 tablets by mouth daily.      Marland Kitchen spironolactone (ALDACTONE) 100 MG tablet Take 1 tablet (100 mg total) by mouth 2 (two) times daily.  60 tablet  0   No current facility-administered medications for this visit.    Allergies as of 06/20/2013 - Review Complete 06/20/2013  Allergen Reaction Noted  . Neurontin [gabapentin]  Anaphylaxis 07/25/2011  . Acetaminophen Itching and Nausea And Vomiting 07/25/2011  . Eggs or egg-derived products Nausea And Vomiting 07/25/2011  . Codeine Itching and Nausea And Vomiting 07/25/2011   Past Medical History  Diagnosis Date  . Guillain-Barre   . Blind   . Neuropathy   . Thyroid disease   . Back pain   . Arthritis   . Seizures   . UTI (lower urinary tract infection)   . Anxiety   . Depression   . DDD (degenerative disc disease), lumbar   . Hepatitis C antibody test positive     HCV RNA is negative 03/2013  . Mild diastolic dysfunction 04/23/2012  . Esophagitis, erosive 03/2014  . Hepatic steatosis 04/23/2013  . Hypomagnesemia 04/23/2013   Past  Surgical History  Procedure Laterality Date  . Cholecystectomy    . Tubal ligation    . Femur fracture surgery    . Ankle surgery    . Back surgery    . Esophagogastroduodenoscopy N/A 03/21/2013    Dr. Rourk: severe erosive reflux esophagitis, hiatal hernia, gastric ulcer with element of partial gastric outlet obstruction secondary to ulcer effect. Likely r/t NSAIDs. negative H.pylori   Family History  Problem Relation Age of Onset  . Colon cancer Neg Hx    History   Social History  . Marital Status: Married    Spouse Name: N/A    Number of Children: N/A  . Years of Education: N/A   Social History Main Topics  . Smoking status: Current Every Day Smoker -- 0.50 packs/day    Types: Cigarettes  . Smokeless tobacco: None  . Alcohol Use: No     Comment: hx of ETOH abuse, none in 6 months  . Drug Use: No  . Sexual Activity: No   Other Topics Concern  . None   Social History Narrative  . None    ROS:  General: Negative for anorexia, weight loss, fever, chills. + fatigue, weakness. ENT: Negative for hoarseness, difficulty swallowing , nasal congestion. CV: Negative for chest pain, angina, palpitations, dyspnea on exertion.+ peripheral edema, improved.  Respiratory: Negative for dyspnea at rest, dyspnea on exertion, cough, sputum, wheezing.  GI: See history of present illness. GU:  Negative for dysuria, hematuria, urinary incontinence, urinary frequency, nocturnal urination.  Endo: Negative for unusual weight change.    Physical Examination:   BP 96/64  Pulse 99  Temp(Src) 97.9 F (36.6 C) (Oral)  Ht 5' 4" (1.626 m)  Wt 176 lb (79.833 kg)  BMI 30.20 kg/m2  General: chronically ill-appearing in no acute distress. In a wheelchair and refuses to get on exam table.  Eyes: No icterus. Mouth: Oropharyngeal mucosa moist and pink , no lesions erythema or exudate. Lungs: Clear to auscultation bilaterally.  Heart: Regular rate and rhythm, no murmurs rubs or gallops.    Abdomen: Bowel sounds are normal, distended but not tense, nontender, no abdominal bruits or hernia , no rebound or guarding.   Extremities: 1+ lower extremity edema. No clubbing or deformities. Neuro: Alert and oriented x 4   Skin: Warm and dry, no jaundice.   Psych: Alert and cooperative, normal mood and affect.  Labs:  Lab Results  Component Value Date   CREATININE 0.64 06/14/2013   BUN 5* 06/14/2013   NA 135* 06/14/2013   K 4.7 06/14/2013   CL 94* 06/14/2013   CO2 34* 06/14/2013   Lab Results  Component Value Date   ALT 9 06/12/2013   AST 26 06/12/2013     ALKPHOS 90 06/12/2013   BILITOT 1.0 06/12/2013   Lab Results  Component Value Date   WBC 8.3 06/12/2013   HGB 11.0* 06/12/2013   HCT 32.8* 06/12/2013   MCV 93.7 06/12/2013   PLT 220 06/12/2013    Imaging Studies: Ct Abdomen Pelvis W Contrast  06/06/2013   CLINICAL DATA:  Constipation, hepatitis-C, status postcholecystectomy, tubal ligation  EXAM: CT ABDOMEN AND PELVIS WITH CONTRAST  TECHNIQUE: Multidetector CT imaging of the abdomen and pelvis was performed using the standard protocol following bolus administration of intravenous contrast.  CONTRAST:  80mL OMNIPAQUE IOHEXOL 300 MG/ML SOLN, 168mL OMNIPAQUE IOHEXOL 300 MG/ML SOLN  COMPARISON:  03/19/2013  FINDINGS: Sagittal images of the spine shows mild degenerative changes. Significant disc space flattening with endplate sclerotic changes and vacuum disc phenomenon at L5-S1 level. Lung bases are unremarkable. There is abundant abdominal and pelvic ascites. Again noted significant decreased attenuation of the liver consistent with fatty infiltration. The patient is status postcholecystectomy. Subtle nodular liver contour suspicious for cirrhosis. Clinical correlation is necessary. Recanalized umbilical vein again noted.  The spleen pancreas and adrenal glands are unremarkable. Kidneys are symmetrical in size and enhancement.  Oral contrast material was given to the patient. No small bowel obstruction. There  is no colonic obstruction. The uterus is atrophic. Urinary bladder is unremarkable. Abundant pelvic ascites. Mild anasarca infiltration of subcutaneous fat lower abdomen and pelvic wall.  Delayed renal images shows bilateral renal symmetrical excretion. No aortic aneurysm. Bilateral visualized proximal ureter is unremarkable.  IMPRESSION: 1. There is abundant abdominal and pelvic ascites. Again noted significant fatty infiltration of the liver. Subtle nodular contour of the liver. Cirrhosis cannot be excluded. Clinical correlation is necessary. 2. No hydronephrosis or hydroureter. 3. No small bowel or colonic obstruction. 4. Bilateral renal symmetrical excretion. 5. Degenerative changes lumbar spine.   Electronically Signed   By: Lahoma Crocker M.D.   On: 06/06/2013 20:46   US Paracentesis  06/14/2013   CLINICAL DATA:  53 year old female with ascites. Hepatitis-C. Abdominal pain. Diagnostic and therapeutic paracentesis requested.  EXAM: ULTRASOUND GUIDED diagnostic and therapeutic PARACENTESIS  COMPARISON:  06/10/2013, 06/07/2013, CT Abdomen and Pelvis 06/06/2013.  PROCEDURE: An ultrasound guided paracentesis was thoroughly discussed with the patient and questions answered. The benefits, risks, alternatives and complications were also discussed. The patient understands and wishes to proceed with the procedure. Written consent was obtained.  Ultrasound was performed to localize and mark an adequate pocket of fluid in the right lower quadrant of the abdomen. The area was then prepped and draped in the normal sterile fashion. 1% Lidocaine was used for local anesthesia. Under ultrasound guidance a 19 gauge Yueh catheter was introduced. Paracentesis was performed. The catheter was removed and a dressing applied.  Complications: None.  FINDINGS: A total of approximately 3200 mL of yellowish fluid was removed. A fluid sample was sent for the requested laboratory analysis.  IMPRESSION: Successful ultrasound guided  paracentesis yielding 3.2 L of ascites.   Electronically Signed   By: Lars Pinks M.D.   On: 06/14/2013 16:47   US Paracentesis  06/10/2013   CLINICAL DATA:  Ascites  EXAM: ULTRASOUND GUIDED THERAPEUTIC PARACENTESIS  COMPARISON:  06/07/2013  PROCEDURE: Procedure, benefits, and risks of procedure were discussed with patient.  Written informed consent for procedure was obtained.  Time out protocol followed.  Adequate collection of ascites localized in right lower quadrant.  Skin prepped and draped in usual sterile fashion.  Skin and soft tissues anesthetized with 10 mL of 1% lidocaine.  5 [place] Yueh catheter placed into peritoneal cavity.  5800 mL of clear yellow fluid aspirated by vacuum bottle suction.  Procedure tolerated well by patient without immediate complication.  FINDINGS: As above  IMPRESSION: Successful ultrasound guided paracentesis yielding 5800 mL of ascites.   Electronically Signed   By: Lavonia Dana M.D.   On: 06/10/2013 17:05   US Paracentesis  06/07/2013   CLINICAL DATA:  Ascites, question liver disease  EXAM: ULTRASOUND GUIDED DIAGNOSTIC AND THERAPEUTIC PARACENTESIS  COMPARISON:  None  PROCEDURE: Procedure, benefits, and risks of procedure were discussed with patient.  Written informed consent for procedure was obtained.  Time out protocol followed.  Adequate collection of ascites localized in right lower quadrant.  Skin prepped and draped in usual sterile fashion.  Skin and soft tissues anesthetized with 10 mL of 1% lidocaine.  5 [place] Yueh catheter placed into peritoneal cavity.  4000 mL of clear yellow fluid aspirated by vacuum bottle suction.  Aspiration limited to 4 L of fluid as this is the patient's first paracentesis.  Procedure tolerated well by patient without immediate complication.  FINDINGS: A total of approximately 4000 mL of clear yellow fluid was removed. A fluid sample of 180 mL was sent for laboratory analysis.  IMPRESSION: Successful ultrasound guided paracentesis yielding  4000 mL of ascites.   Electronically Signed   By: Lavonia Dana M.D.   On: 06/07/2013 16:37

## 2013-06-20 NOTE — Patient Instructions (Signed)
1. Colonoscopy and upper endoscopy with Dr. Gala Romney as scheduled.  Diet for Peptic Ulcer Disease Peptic ulcer disease is a term used to describe open sores in the stomach and digestive tract. These sores can be painful, and the pain can be worse when the stomach is empty. These ulcers can lead to bleeding in the esophagus, stomach, and intestines (gastrointestinaltract). Nutrition therapy can help ease the discomfort of peptic ulcer disease.   GENERAL DIET GUIDELINES FOR PEPTIC ULCER DISEASE  Your diet should be rich in fruits, vegetables, and whole grains. It should also be low in fat.   Avoid foods that can irritate your sores.   Avoid foods that are not tolerated or foods that cause pain. This may be different for different people. FOODS AND DRINKS TO AVOID  High-fat dairy and milk products including whole milk, cream, chocolate milk, butter, sour cream, or cream cheese.   High-fat meats and poultry including hot dogs, fried meats or poultry, meats with skin, sausage, or bacon.   Pepper.  Alcohol.  Chocolate.  Tea, coffee, and cola (regular and caffeine).  Lard or hydrogenated oils such as stick margarine.  SAMPLE MEAL PLAN This meal plan is approximately 2000 calories based on [email or web address] meal planning guidelines.  Breakfast   cup cooked oatmeal.  1 cup strawberries.  1 cup low-fat milk.  1 oz almonds. Snack  1 cup cucumber slices.  6 oz yogurt (made from low-fat or fat-free milk). Lunch  2 slices whole-wheat bread.  2 oz sliced [place].  2 tsp mayonnaise.  1 cup blueberries.  1 cup snap peas. Snack  6 whole-wheat crackers.  1 oz string cheese. Dinner   cup brown rice.  1 cup mixed veggies.  1 tsp olive oil.  3 oz grilled fish. Document Released: 06/23/2011 Document Reviewed: 06/23/2011 [employer] Patient Information 2014 [place], [place].  2 Gram Low Sodium Diet A 2 gram sodium diet restricts the amount of sodium in the diet to no  more than 2 g or 2000 mg daily. Limiting the amount of sodium is often used to help lower blood pressure. It is important if you have heart, liver, or kidney problems. Many foods contain sodium for flavor and sometimes as a preservative. When the amount of sodium in a diet needs to be low, it is important to know what to look for when choosing foods and drinks. The following includes some information and guidelines to help make it easier for you to adapt to a low sodium diet. QUICK TIPS  Do not add salt to food.  Avoid convenience items and fast food.  Choose unsalted snack foods.  Buy lower sodium products, often labeled as "lower sodium" or "no salt added."  Check food labels to learn how much sodium is in 1 serving.  When eating at a restaurant, ask that your food be prepared with less salt or none, if possible. READING FOOD LABELS FOR SODIUM INFORMATION The nutrition facts label is a good place to find how much sodium is in foods. Look for products with no more than 500 to 600 mg of sodium per meal and no more than 150 mg per serving. Remember that 2 g = 2000 mg. The food label may also list foods as:  Sodium-free: Less than 5 mg in a serving.  Very low sodium: 35 mg or less in a serving.  Low-sodium: 140 mg or less in a serving.  Light in sodium: 50% less sodium in a serving. For example, if a food that  usually has 300 mg of sodium is changed to become light in sodium, it will have 150 mg of sodium.  Reduced sodium: 25% less sodium in a serving. For example, if a food that usually has 400 mg of sodium is changed to reduced sodium, it will have 300 mg of sodium. CHOOSING FOODS Grains  Avoid: Salted crackers and snack items. Some cereals, including instant hot cereals. Bread stuffing and biscuit mixes. Seasoned rice or pasta mixes.  Choose: Unsalted snack items. Low-sodium cereals, oats, puffed wheat and rice, shredded wheat. English muffins and bread. Pasta. Meats  Avoid:  Salted, canned, smoked, spiced, pickled meats, including fish and poultry. Bacon, ham, sausage, cold cuts, hot dogs, anchovies.  Choose: Low-sodium canned tuna and salmon. Fresh or frozen meat, poultry, and fish. Dairy  Avoid: Processed cheese and spreads. Cottage cheese. Buttermilk and condensed milk. Regular cheese.  Choose: Milk. Low-sodium cottage cheese. Yogurt. Sour cream. Low-sodium cheese. Fruits and Vegetables  Avoid: Regular canned vegetables. Regular canned tomato sauce and paste. Frozen vegetables in sauces. Olives. Angie Fava. Relishes. Sauerkraut.  Choose: Low-sodium canned vegetables. Low-sodium tomato sauce and paste. Frozen or fresh vegetables. Fresh and frozen fruit. Condiments  Avoid: Canned and packaged gravies. Worcestershire sauce. Tartar sauce. Barbecue sauce. Soy sauce. Steak sauce. Ketchup. Onion, garlic, and table salt. Meat flavorings and tenderizers.  Choose: Fresh and dried herbs and spices. Low-sodium varieties of mustard and ketchup. Lemon juice. Tabasco sauce. Horseradish. SAMPLE 2 GRAM SODIUM MEAL PLAN Breakfast / Sodium (mg)  1 cup low-fat milk / 638 mg  2 slices whole-wheat toast / 270 mg  1 tbs heart-healthy margarine / 153 mg  1 hard-boiled egg / 139 mg  1 small orange / 0 mg Lunch / Sodium (mg)  1 cup raw carrots / 76 mg   cup hummus / 298 mg  1 cup low-fat milk / 143 mg   cup red grapes / 2 mg  1 whole-wheat pita bread / 356 mg Dinner / Sodium (mg)  1 cup whole-wheat pasta / 2 mg  1 cup low-sodium tomato sauce / 73 mg  3 oz lean ground beef / 57 mg  1 small side salad (1 cup raw spinach leaves,  cup cucumber,  cup yellow bell pepper) with 1 tsp olive oil and 1 tsp red wine vinegar / 25 mg Snack / Sodium (mg)  1 container low-fat vanilla yogurt / 107 mg  3 graham cracker squares / 127 mg Nutrient Analysis  Calories: 2033  Protein: 77 g  Carbohydrate: 282 g  Fat: 72 g  Sodium: 1971 mg Document Released: 03/31/2005  Document Revised: 06/23/2011 Document Reviewed: 07/02/2009 [employer] Patient Information 2014 [place].

## 2013-06-22 ENCOUNTER — Encounter: Payer: Self-pay | Admitting: Gastroenterology

## 2013-06-22 NOTE — Assessment & Plan Note (Addendum)
Intermittent rectal bleeding in setting of chronic constipation. Likely benign anorectal source but other etiologies need to be excluded such as colopathy, malignancy, less likely IBD. Colonoscopy in near future.  I have discussed the risks, alternatives, benefits with regards to but not limited to the risk of reaction to medication, bleeding, infection, perforation and the patient is agreeable to proceed. Written consent to be obtained. Augment conscious sedation with phenergan 25mg  IV 30 minutes before procedure given polypharmacy.   She is on multiple agents for constipation currently. She is taken less than recommended amount of lactulose. She will start prescribed dose. May consider adding Amitiza or Linzess in near future.

## 2013-06-22 NOTE — Assessment & Plan Note (Addendum)
Probable cirrhosis related to prior etoh and/or fatty liver. Went for LVAP day of OV but not enough fluid to draw off. Her peripheral edema overall has improved significantly. Continue current diuretics for now. We will have to check her immune status at upcoming appointment. Given multiple ongoing issues, discussed with patient that we would more thoroughly address liver issues at next OV. Continue 2 gram sodium diet.   OV in 4-6 weeks.

## 2013-06-22 NOTE — Assessment & Plan Note (Signed)
Due for surveillance EGD.  I have discussed the risks, alternatives, benefits with regards to but not limited to the risk of reaction to medication, bleeding, infection, perforation and the patient is agreeable to proceed. Written consent to be obtained.

## 2013-06-22 NOTE — Progress Notes (Signed)
Patient needs f/u in 4-6 weeks for liver disease.

## 2013-06-23 NOTE — Progress Notes (Signed)
cc'd to pcp 

## 2013-06-24 ENCOUNTER — Encounter: Payer: Self-pay | Admitting: Gastroenterology

## 2013-06-24 ENCOUNTER — Encounter (HOSPITAL_COMMUNITY): Payer: Self-pay | Admitting: *Deleted

## 2013-06-24 ENCOUNTER — Ambulatory Visit (HOSPITAL_COMMUNITY)
Admission: RE | Admit: 2013-06-24 | Discharge: 2013-06-24 | Disposition: A | Discharge status: Home / Self Care | Payer: Medicare Other | Source: Ambulatory Visit | Attending: Internal Medicine | Admitting: Internal Medicine

## 2013-06-24 ENCOUNTER — Encounter (HOSPITAL_COMMUNITY)
Admission: RE | Disposition: A | Discharge status: Home / Self Care | Payer: Self-pay | Source: Ambulatory Visit | Attending: Internal Medicine

## 2013-06-24 DIAGNOSIS — K649 Unspecified hemorrhoids: Secondary | ICD-10-CM | POA: Insufficient documentation

## 2013-06-24 DIAGNOSIS — K921 Melena: Secondary | ICD-10-CM

## 2013-06-24 DIAGNOSIS — R109 Unspecified abdominal pain: Secondary | ICD-10-CM

## 2013-06-24 DIAGNOSIS — K2100 Gastro-esophageal reflux disease with esophagitis, without bleeding: Secondary | ICD-10-CM | POA: Insufficient documentation

## 2013-06-24 DIAGNOSIS — Z8711 Personal history of peptic ulcer disease: Secondary | ICD-10-CM | POA: Insufficient documentation

## 2013-06-24 DIAGNOSIS — K5730 Diverticulosis of large intestine without perforation or abscess without bleeding: Secondary | ICD-10-CM | POA: Insufficient documentation

## 2013-06-24 DIAGNOSIS — F17200 Nicotine dependence, unspecified, uncomplicated: Secondary | ICD-10-CM | POA: Insufficient documentation

## 2013-06-24 DIAGNOSIS — K5900 Constipation, unspecified: Secondary | ICD-10-CM | POA: Insufficient documentation

## 2013-06-24 DIAGNOSIS — K625 Hemorrhage of anus and rectum: Secondary | ICD-10-CM

## 2013-06-24 DIAGNOSIS — K449 Diaphragmatic hernia without obstruction or gangrene: Secondary | ICD-10-CM | POA: Insufficient documentation

## 2013-06-24 DIAGNOSIS — D126 Benign neoplasm of colon, unspecified: Secondary | ICD-10-CM

## 2013-06-24 DIAGNOSIS — K319 Disease of stomach and duodenum, unspecified: Secondary | ICD-10-CM | POA: Insufficient documentation

## 2013-06-24 DIAGNOSIS — K7689 Other specified diseases of liver: Secondary | ICD-10-CM | POA: Insufficient documentation

## 2013-06-24 DIAGNOSIS — K279 Peptic ulcer, site unspecified, unspecified as acute or chronic, without hemorrhage or perforation: Secondary | ICD-10-CM

## 2013-06-24 HISTORY — PX: COLONOSCOPY WITH ESOPHAGOGASTRODUODENOSCOPY (EGD): SHX5779

## 2013-06-24 SURGERY — COLONOSCOPY WITH ESOPHAGOGASTRODUODENOSCOPY (EGD)
Anesthesia: Moderate Sedation

## 2013-06-24 MED ORDER — MEPERIDINE HCL 100 MG/ML IJ SOLN
INTRAMUSCULAR | Status: AC
  Filled 2013-06-24: qty 2

## 2013-06-24 MED ORDER — SODIUM CHLORIDE 0.9 % IV SOLN
INTRAVENOUS | Status: DC
  Administered 2013-06-24: 10:00:00 via INTRAVENOUS

## 2013-06-24 MED ORDER — MIDAZOLAM HCL 5 MG/5ML IJ SOLN
INTRAMUSCULAR | Status: AC
  Filled 2013-06-24: qty 10

## 2013-06-24 MED ORDER — ONDANSETRON HCL 4 MG/2ML IJ SOLN
INTRAMUSCULAR | Status: AC
  Filled 2013-06-24: qty 2

## 2013-06-24 MED ORDER — MIDAZOLAM HCL 5 MG/5ML IJ SOLN
INTRAMUSCULAR | Status: DC | PRN
  Administered 2013-06-24 (×2): 1 mg via INTRAVENOUS
  Administered 2013-06-24: 2 mg via INTRAVENOUS
  Administered 2013-06-24 (×3): 1 mg via INTRAVENOUS

## 2013-06-24 MED ORDER — ONDANSETRON HCL 4 MG/2ML IJ SOLN
INTRAMUSCULAR | Status: DC | PRN
  Administered 2013-06-24: 4 mg via INTRAVENOUS

## 2013-06-24 MED ORDER — STERILE WATER FOR IRRIGATION IR SOLN
Status: DC | PRN
  Administered 2013-06-24: 12:00:00

## 2013-06-24 MED ORDER — LIDOCAINE VISCOUS 2 % MT SOLN
OROMUCOSAL | Status: AC
  Filled 2013-06-24: qty 15

## 2013-06-24 MED ORDER — SODIUM CHLORIDE 0.9 % IJ SOLN
INTRAMUSCULAR | Status: AC
  Filled 2013-06-24: qty 10

## 2013-06-24 MED ORDER — MEPERIDINE HCL 100 MG/ML IJ SOLN
INTRAMUSCULAR | Status: DC | PRN
  Administered 2013-06-24 (×2): 25 mg via INTRAVENOUS

## 2013-06-24 MED ORDER — PROMETHAZINE HCL 25 MG/ML IJ SOLN
INTRAMUSCULAR | Status: AC
  Filled 2013-06-24: qty 1

## 2013-06-24 MED ORDER — LIDOCAINE VISCOUS 2 % MT SOLN
OROMUCOSAL | Status: DC | PRN
  Administered 2013-06-24: 1 via OROMUCOSAL

## 2013-06-24 MED ORDER — PROMETHAZINE HCL 25 MG/ML IJ SOLN
25.0000 mg | Freq: Once | INTRAMUSCULAR | Status: AC
  Administered 2013-06-24: 25 mg via INTRAVENOUS

## 2013-06-24 NOTE — Discharge Instructions (Addendum)
Colonoscopy Discharge Instructions  Read the instructions outlined below and refer to this sheet in the next few weeks. These discharge instructions provide you with general information on caring for yourself after you leave the hospital. Your doctor may also give you specific instructions. While your treatment has been planned according to the most current medical practices available, unavoidable complications occasionally occur. If you have any problems or questions after discharge, call Dr. Gala Romney at [phone]. ACTIVITY  You may resume your regular activity, but move at a slower pace for the next 24 hours.   Take frequent rest periods for the next 24 hours.   Walking will help get rid of the air and reduce the bloated feeling in your belly (abdomen).   No driving for 24 hours (because of the medicine (anesthesia) used during the test).    Do not sign any important legal documents or operate any machinery for 24 hours (because of the anesthesia used during the test).  NUTRITION  Drink plenty of fluids.   You may resume your normal diet as instructed by your doctor.   Begin with a light meal and progress to your normal diet. Heavy or fried foods are harder to digest and may make you feel sick to your stomach (nauseated).   Avoid alcoholic beverages for 24 hours or as instructed.  MEDICATIONS  You may resume your normal medications unless your doctor tells you otherwise.  WHAT YOU CAN EXPECT TODAY  Some feelings of bloating in the abdomen.   Passage of more gas than usual.   Spotting of blood in your stool or on the toilet paper.  IF YOU HAD POLYPS REMOVED DURING THE COLONOSCOPY:  No aspirin products for 7 days or as instructed.   No alcohol for 7 days or as instructed.   Eat a soft diet for the next 24 hours.  FINDING OUT THE RESULTS OF YOUR TEST Not all test results are available during your visit. If your test results are not back during the visit, make an appointment  with your caregiver to find out the results. Do not assume everything is normal if you have not heard from your caregiver or the medical facility. It is important for you to follow up on all of your test results.  SEEK IMMEDIATE MEDICAL ATTENTION IF:  You have more than a spotting of blood in your stool.   Your belly is swollen (abdominal distention).   You are nauseated or vomiting.   You have a temperature over 101.  You have abdominal pain or discomfort that is severe or gets worse throughout the day. EGD Discharge instructions Please read the instructions outlined below and refer to this sheet in the next few weeks. These discharge instructions provide you with general information on caring for yourself after you leave the hospital. Your doctor may also give you specific instructions. While your treatment has been planned according to the most current medical practices available, unavoidable complications occasionally occur. If you have any problems or questions after discharge, please call your doctor. ACTIVITY You may resume your regular activity but move at a slower pace for the next 24 hours.  Take frequent rest periods for the next 24 hours.  Walking will help expel (get rid of) the air and reduce the bloated feeling in your abdomen.  No driving for 24 hours (because of the anesthesia (medicine) used during the test).  You may shower.  Do not sign any important legal documents or operate any machinery for 24  hours (because of the anesthesia used during the test).  NUTRITION Drink plenty of fluids.  You may resume your normal diet.  Begin with a light meal and progress to your normal diet.  Avoid alcoholic beverages for 24 hours or as instructed by your caregiver.  MEDICATIONS You may resume your normal medications unless your caregiver tells you otherwise.  WHAT YOU CAN EXPECT TODAY You may experience abdominal discomfort such as a feeling of fullness or gas pains.   FOLLOW-UP Your doctor will discuss the results of your test with you.  SEEK IMMEDIATE MEDICAL ATTENTION IF ANY OF THE FOLLOWING OCCUR: Excessive nausea (feeling sick to your stomach) and/or vomiting.  Severe abdominal pain and distention (swelling).  Trouble swallowing.  Temperature over 101 F (37.8 C).  Rectal bleeding or vomiting of blood.     Decrease to Protonix 40 mg daily  Avoid Advil and other nonsteroidal products avoid aspirin.  Diverticulosis and polyp information provided  MiraLax daily to avoid constipation  Further recommendations to follow pending review of pathology report  Office visit with [place] in 6-8 weeks   Diverticulosis Diverticulosis is a common condition that develops when small pouches (diverticula) form in the wall of the colon. The risk of diverticulosis increases with age. It happens more often in people who eat a low-fiber diet. Most individuals with diverticulosis have no symptoms. Those individuals with symptoms usually experience abdominal pain, constipation, or loose stools (diarrhea). HOME CARE INSTRUCTIONS   Increase the amount of fiber in your diet as directed by your caregiver or dietician. This may reduce symptoms of diverticulosis.  Your caregiver may recommend taking a dietary fiber supplement.  Drink at least 6 to 8 glasses of water each day to prevent constipation.  Try not to strain when you have a bowel movement.  Your caregiver may recommend avoiding nuts and seeds to prevent complications, although this is still an uncertain benefit.  Only take over-the-counter or prescription medicines for pain, discomfort, or fever as directed by your caregiver. FOODS WITH HIGH FIBER CONTENT INCLUDE:  Fruits. Apple, peach, pear, tangerine, raisins, prunes.  Vegetables. Brussels sprouts, asparagus, broccoli, cabbage, carrot, cauliflower, romaine lettuce, spinach, summer squash, tomato, winter squash, zucchini.  Starchy Vegetables. Baked  beans, kidney beans, lima beans, split peas, lentils, potatoes (with skin).  Grains. Whole wheat bread, brown rice, bran flake cereal, plain oatmeal, white rice, shredded wheat, bran muffins. SEEK IMMEDIATE MEDICAL CARE IF:   You develop increasing pain or severe bloating.  You have an oral temperature above 102 F (38.9 C), not controlled by medicine.  You develop vomiting or bowel movements that are bloody or black.   Colon Polyps Polyps are lumps of extra tissue growing inside the body. Polyps can grow in the large intestine (colon). Most colon polyps are noncancerous (benign). However, some colon polyps can become cancerous over time. Polyps that are larger than a pea may be harmful. To be safe, caregivers remove and test all polyps. CAUSES  Polyps form when mutations in the genes cause your cells to grow and divide even though no more tissue is needed. RISK FACTORS There are a number of risk factors that can increase your chances of getting colon polyps. They include:  Being older than 50 years.  Family history of colon polyps or colon cancer.  Long-term colon diseases, such as colitis or Crohn disease.  Being overweight.  Smoking.  Being inactive.  Drinking too much alcohol. SYMPTOMS  Most small polyps do not cause symptoms. If symptoms  are present, they may include:  Blood in the stool. The stool may look dark red or black.  Constipation or diarrhea that lasts longer than 1 week. DIAGNOSIS People often do not know they have polyps until their caregiver finds them during a regular checkup. Your caregiver can use 4 tests to check for polyps:  Digital rectal exam. The caregiver wears gloves and feels inside the rectum. This test would find polyps only in the rectum.  Barium enema. The caregiver puts a liquid called barium into your rectum before taking X-rays of your colon. Barium makes your colon look white. Polyps are dark, so they are easy to see in the X-ray  pictures.  Sigmoidoscopy. A thin, flexible tube (sigmoidoscope) is placed into your rectum. The sigmoidoscope has a light and tiny camera in it. The caregiver uses the sigmoidoscope to look at the last third of your colon.  Colonoscopy. This test is like sigmoidoscopy, but the caregiver looks at the entire colon. This is the most common method for finding and removing polyps. TREATMENT  Any polyps will be removed during a sigmoidoscopy or colonoscopy. The polyps are then tested for cancer. PREVENTION  To help lower your risk of getting more colon polyps:  Eat plenty of fruits and vegetables. Avoid eating fatty foods.  Do not smoke.  Avoid drinking alcohol.  Exercise every day.  Lose weight if recommended by your caregiver.  Eat plenty of calcium and folate. Foods that are rich in calcium include milk, cheese, and broccoli. Foods that are rich in folate include chickpeas, kidney beans, and spinach. HOME CARE INSTRUCTIONS Keep all follow-up appointments as directed by your caregiver. You may need periodic exams to check for polyps. SEEK MEDICAL CARE IF: You notice bleeding during a bowel movement.

## 2013-06-24 NOTE — H&P (View-Only) (Signed)
Primary Care Physician: Celedonio Savage, MD  Primary Gastroenterologist:  Garfield Cornea, MD   Chief Complaint  Patient presents with  . Follow-up    HPI: [name] is a 53 y.o. female here for hospital follow-up. Two recent admissions. She has h/o EGD in 03/2013 with severe ERE, PUD secondary to NSAIDs with likely partial GOO, acute blood loss anemia, ?cirrhosis with elevated LFTs. Liver w/u for PBC, autoimmune unremarkable. Her IgG/IgA were elevated and antismooth muscle Ab weakly positive, unclear signficance. H/o HCV but RNA undetectable. Admission last month for new onset ascites/anasarca (3 LVAP since 05/2013), significant hypoalbuminemia. Also notable chronic constipation likely playing a role in chronic abdominal pain. Needs routine screening colonoscopy in future at time of EGD, as last was in remote past. Scant hematochezia likely benign in the setting of constipation.    Patient complains of ongoing constipation. Hemorrhoids flare with straining. Taking lactulose two teaspoons two times per day. On Miralax and Senna. Has tried to improve diet. No carbonated beverage. Drinking protein drinks. Some heartburn sometimes. No melena. Some brbpr. Pain on left side better when fluid draw off. No vomiting. Continues to have lower extremity edema but much improved.  States she is scheduled for LVAP today.  Current Outpatient Prescriptions  Medication Sig Dispense Refill  . cyanocobalamin (,VITAMIN B-12,) 1000 MCG/ML injection Inject 1,000 mcg into the muscle every 30 (thirty) days. Sunday      . desvenlafaxine (PRISTIQ) 50 MG 24 hr tablet Take 50 mg by mouth daily.      . folic acid (FOLVITE) 1 MG tablet Take 1 tablet (1 mg total) by mouth daily.      . furosemide (LASIX) 40 MG tablet Take 1 tablet (40 mg total) by mouth 2 (two) times daily.  60 tablet  0  . lactulose (CHRONULAC) 10 GM/15ML solution Take 30 mLs (20 g total) by mouth 2 (two) times daily.  240 mL  0  . levothyroxine  (SYNTHROID, LEVOTHROID) 50 MCG tablet Take 50 mcg by mouth daily before breakfast.      . ondansetron (ZOFRAN) 4 MG tablet Take 1 tablet (4 mg total) by mouth every 6 (six) hours as needed for nausea.  20 tablet  0  . OxyCODONE (OXYCONTIN) 10 mg T12A 12 hr tablet Take 10 mg by mouth 3 (three) times daily.      . pantoprazole (PROTONIX) 40 MG tablet Take 1 tablet (40 mg total) by mouth 2 (two) times daily before a meal.  60 tablet  0  . polyethylene glycol (MIRALAX / GLYCOLAX) packet Take 17 g by mouth daily.      . potassium chloride SA (K-DUR,KLOR-CON) 20 MEQ tablet Take 1 tablet (20 mEq total) by mouth daily.      . pregabalin (LYRICA) 100 MG capsule Take 100-200 mg by mouth 2 (two) times daily. 1 in the morning and 2 at bedtime      . pregabalin (LYRICA) 50 MG capsule Take 100 mg by mouth 2 (two) times daily. 1 in the morning and 2 at bedtime      . sennosides-docusate sodium (SENOKOT-S) 8.6-50 MG tablet Take 2 tablets by mouth daily.      Marland Kitchen spironolactone (ALDACTONE) 100 MG tablet Take 1 tablet (100 mg total) by mouth 2 (two) times daily.  60 tablet  0   No current facility-administered medications for this visit.    Allergies as of 06/20/2013 - Review Complete 06/20/2013  Allergen Reaction Noted  . Neurontin [gabapentin]  Anaphylaxis 07/25/2011  . Acetaminophen Itching and Nausea And Vomiting 07/25/2011  . Eggs or egg-derived products Nausea And Vomiting 07/25/2011  . Codeine Itching and Nausea And Vomiting 07/25/2011   Past Medical History  Diagnosis Date  . Guillain-Barre   . Blind   . Neuropathy   . Thyroid disease   . Back pain   . Arthritis   . Seizures   . UTI (lower urinary tract infection)   . Anxiety   . Depression   . DDD (degenerative disc disease), lumbar   . Hepatitis C antibody test positive     HCV RNA is negative 03/2013  . Mild diastolic dysfunction 04/23/2012  . Esophagitis, erosive 03/2014  . Hepatic steatosis 04/23/2013  . Hypomagnesemia 04/23/2013   Past  Surgical History  Procedure Laterality Date  . Cholecystectomy    . Tubal ligation    . Femur fracture surgery    . Ankle surgery    . Back surgery    . Esophagogastroduodenoscopy N/A 03/21/2013    Dr. Jena Gauss: severe erosive reflux esophagitis, hiatal hernia, gastric ulcer with element of partial gastric outlet obstruction secondary to ulcer effect. Likely r/t NSAIDs. negative H.pylori   Family History  Problem Relation Age of Onset  . Colon cancer Neg Hx    History   Social History  . Marital Status: Married    Spouse Name: N/A    Number of Children: N/A  . Years of Education: N/A   Social History Main Topics  . Smoking status: Current Every Day Smoker -- 0.50 packs/day    Types: Cigarettes  . Smokeless tobacco: None  . Alcohol Use: No     Comment: hx of ETOH abuse, none in 6 months  . Drug Use: No  . Sexual Activity: No   Other Topics Concern  . None   Social History Narrative  . None    ROS:  General: Negative for anorexia, weight loss, fever, chills. + fatigue, weakness. ENT: Negative for hoarseness, difficulty swallowing , nasal congestion. CV: Negative for chest pain, angina, palpitations, dyspnea on exertion.+ peripheral edema, improved.  Respiratory: Negative for dyspnea at rest, dyspnea on exertion, cough, sputum, wheezing.  GI: See history of present illness. GU:  Negative for dysuria, hematuria, urinary incontinence, urinary frequency, nocturnal urination.  Endo: Negative for unusual weight change.    Physical Examination:   BP 96/64  Pulse 99  Temp(Src) 97.9 F (36.6 C) (Oral)  Ht  (1.626 m)  Wt 176 lb (79.833 kg)  BMI 30.20 kg/m2  General: chronically ill-appearing in no acute distress. In a wheelchair and refuses to get on exam table.  Eyes: No icterus. Mouth: Oropharyngeal mucosa moist and pink , no lesions erythema or exudate. Lungs: Clear to auscultation bilaterally.  Heart: Regular rate and rhythm, no murmurs rubs or gallops.    Abdomen: Bowel sounds are normal, distended but not tense, nontender, no abdominal bruits or hernia , no rebound or guarding.   Extremities: 1+ lower extremity edema. No clubbing or deformities. Neuro: Alert and oriented x 4   Skin: Warm and dry, no jaundice.   Psych: Alert and cooperative, normal mood and affect.  Labs:  Lab Results  Component Value Date   CREATININE 0.64 06/14/2013   BUN 5* 06/14/2013   NA 135* 06/14/2013   K 4.7 06/14/2013   CL 94* 06/14/2013   CO2 34* 06/14/2013   Lab Results  Component Value Date   ALT 9 06/12/2013   AST 26 06/12/2013  ALKPHOS 90 06/12/2013   BILITOT 1.0 06/12/2013   Lab Results  Component Value Date   WBC 8.3 06/12/2013   HGB 11.0* 06/12/2013   HCT 32.8* 06/12/2013   MCV 93.7 06/12/2013   PLT 220 06/12/2013    Imaging Studies: Ct Abdomen Pelvis W Contrast  06/06/2013   CLINICAL DATA:  Constipation, hepatitis-C, status postcholecystectomy, tubal ligation  EXAM: CT ABDOMEN AND PELVIS WITH CONTRAST  TECHNIQUE: Multidetector CT imaging of the abdomen and pelvis was performed using the standard protocol following bolus administration of intravenous contrast.  CONTRAST:  71mL OMNIPAQUE IOHEXOL 300 MG/ML SOLN, 169mL OMNIPAQUE IOHEXOL 300 MG/ML SOLN  COMPARISON:  03/19/2013  FINDINGS: Sagittal images of the spine shows mild degenerative changes. Significant disc space flattening with endplate sclerotic changes and vacuum disc phenomenon at L5-S1 level. Lung bases are unremarkable. There is abundant abdominal and pelvic ascites. Again noted significant decreased attenuation of the liver consistent with fatty infiltration. The patient is status postcholecystectomy. Subtle nodular liver contour suspicious for cirrhosis. Clinical correlation is necessary. Recanalized umbilical vein again noted.  The spleen pancreas and adrenal glands are unremarkable. Kidneys are symmetrical in size and enhancement.  Oral contrast material was given to the patient. No small bowel obstruction. There  is no colonic obstruction. The uterus is atrophic. Urinary bladder is unremarkable. Abundant pelvic ascites. Mild anasarca infiltration of subcutaneous fat lower abdomen and pelvic wall.  Delayed renal images shows bilateral renal symmetrical excretion. No aortic aneurysm. Bilateral visualized proximal ureter is unremarkable.  IMPRESSION: 1. There is abundant abdominal and pelvic ascites. Again noted significant fatty infiltration of the liver. Subtle nodular contour of the liver. Cirrhosis cannot be excluded. Clinical correlation is necessary. 2. No hydronephrosis or hydroureter. 3. No small bowel or colonic obstruction. 4. Bilateral renal symmetrical excretion. 5. Degenerative changes lumbar spine.   Electronically Signed   By: Lahoma Crocker M.D.   On: 06/06/2013 20:46   US Paracentesis  06/14/2013   CLINICAL DATA:  53 year old female with ascites. Hepatitis-C. Abdominal pain. Diagnostic and therapeutic paracentesis requested.  EXAM: ULTRASOUND GUIDED diagnostic and therapeutic PARACENTESIS  COMPARISON:  06/10/2013, 06/07/2013, CT Abdomen and Pelvis 06/06/2013.  PROCEDURE: An ultrasound guided paracentesis was thoroughly discussed with the patient and questions answered. The benefits, risks, alternatives and complications were also discussed. The patient understands and wishes to proceed with the procedure. Written consent was obtained.  Ultrasound was performed to localize and mark an adequate pocket of fluid in the right lower quadrant of the abdomen. The area was then prepped and draped in the normal sterile fashion. 1% Lidocaine was used for local anesthesia. Under ultrasound guidance a 19 gauge Yueh catheter was introduced. Paracentesis was performed. The catheter was removed and a dressing applied.  Complications: None.  FINDINGS: A total of approximately 3200 mL of yellowish fluid was removed. A fluid sample was sent for the requested laboratory analysis.  IMPRESSION: Successful ultrasound guided  paracentesis yielding 3.2 L of ascites.   Electronically Signed   By: Lars Pinks M.D.   On: 06/14/2013 16:47   US Paracentesis  06/10/2013   CLINICAL DATA:  Ascites  EXAM: ULTRASOUND GUIDED THERAPEUTIC PARACENTESIS  COMPARISON:  06/07/2013  PROCEDURE: Procedure, benefits, and risks of procedure were discussed with patient.  Written informed consent for procedure was obtained.  Time out protocol followed.  Adequate collection of ascites localized in right lower quadrant.  Skin prepped and draped in usual sterile fashion.  Skin and soft tissues anesthetized with 10 mL of 1% lidocaine.  5 [place] Yueh catheter placed into peritoneal cavity.  5800 mL of clear yellow fluid aspirated by vacuum bottle suction.  Procedure tolerated well by patient without immediate complication.  FINDINGS: As above  IMPRESSION: Successful ultrasound guided paracentesis yielding 5800 mL of ascites.   Electronically Signed   By: Lavonia Dana M.D.   On: 06/10/2013 17:05   US Paracentesis  06/07/2013   CLINICAL DATA:  Ascites, question liver disease  EXAM: ULTRASOUND GUIDED DIAGNOSTIC AND THERAPEUTIC PARACENTESIS  COMPARISON:  None  PROCEDURE: Procedure, benefits, and risks of procedure were discussed with patient.  Written informed consent for procedure was obtained.  Time out protocol followed.  Adequate collection of ascites localized in right lower quadrant.  Skin prepped and draped in usual sterile fashion.  Skin and soft tissues anesthetized with 10 mL of 1% lidocaine.  5 [place] Yueh catheter placed into peritoneal cavity.  4000 mL of clear yellow fluid aspirated by vacuum bottle suction.  Aspiration limited to 4 L of fluid as this is the patient's first paracentesis.  Procedure tolerated well by patient without immediate complication.  FINDINGS: A total of approximately 4000 mL of clear yellow fluid was removed. A fluid sample of 180 mL was sent for laboratory analysis.  IMPRESSION: Successful ultrasound guided paracentesis yielding  4000 mL of ascites.   Electronically Signed   By: Lavonia Dana M.D.   On: 06/07/2013 16:37

## 2013-06-24 NOTE — Progress Notes (Signed)
Pt is aware of OV on 4/24 at 1030 with [facility] and appt card was mailed

## 2013-06-24 NOTE — Op Note (Signed)
[place], [id number]   COLONOSCOPY PROCEDURE REPORT  PATIENT: [name], [name]  MR#:         [id number] BIRTHDATE: November 01, 1960 , 5  yrs. old GENDER: Female ENDOSCOPIST: Bridgette Habermann, MD FACP [facility] REFERRED BY: PROCEDURE DATE:  06/24/2013 PROCEDURE:     Colonoscopy with snare polypectomy  INDICATIONS: hematochezia  INFORMED CONSENT:  The risks, benefits, alternatives and imponderables including but not limited to bleeding, perforation as well as the possibility of a missed lesion have been reviewed.  The potential for biopsy, lesion removal, etc. have also been discussed.  Questions have been answered.  All parties agreeable. Please see the history and physical in the medical record for more information.  MEDICATIONS: Versed 7 mg Demerol 50 mg IV in divided doses. Zofran 4 mg IV.  DESCRIPTION OF PROCEDURE:  After a digital rectal exam was performed, the EG-[id number] ([id number]) and EC-[id number] ([id number]) colonoscope was advanced from the anus through the rectum and colon to the area of the cecum, ileocecal valve and appendiceal orifice. The cecum was deeply intubated.  These structures were well-seen and photographed for the record.  From the level of the cecum and ileocecal valve, the scope was slowly and cautiously withdrawn. The mucosal surfaces were carefully surveyed utilizing scope tip deflection to facilitate fold flattening as needed.  The scope was pulled down into the rectum where a thorough examination including retroflexion was performed.    FINDINGS:  Adequate preparation. Rectal varices present; diffuse Snake skinning of the rectal and colonic mucosa consistent with portal colopathy. Scattered pancolonic diverticula. Patient a 1 cm pedunculated polyp in the ascending segment;, the remainder of colonic mucosa appeared normal.  THERAPEUTIC / DIAGNOSTIC MANEUVERS PERFORMED:  The above-mentioned polyp was removed cleanly with  one pass of hot snare cautery.  COMPLICATIONS: None  CECAL WITHDRAWAL TIME:  9 minutes  IMPRESSION:  Rectal varices/anal canal hemorrhoids. Portal colopathy.Colonic diverticulosis. Colonic polyp-removed as described above  RECOMMENDATIONS:  Minimize constipation.  Daily MiraLax. Followup on pathology. See EGD report   _______________________________ eSigned:  R. Garfield Cornea, MD FACP [facility] 06/24/2013 12:37 PM   CC:    PATIENT NAME:  [name], [name] MR#: [id number]

## 2013-06-24 NOTE — Op Note (Signed)
[name], [id number]   ENDOSCOPY PROCEDURE REPORT  PATIENT: [name], [name]  MR#: [id number] BIRTHDATE: 12-05-1960 , 14  yrs. old GENDER: Female ENDOSCOPIST: R.  Garfield Cornea, MD FACP [facility] REFERRED BY:  Celedonio Savage, M.D. PROCEDURE DATE:  06/24/2013 PROCEDURE:     Diagnostic EGD  INDICATIONS:    Document healing of gastric ulcers  INFORMED CONSENT:   The risks, benefits, limitations, alternatives and imponderables have been discussed.  The potential for biopsy, esophogeal dilation, etc. have also been reviewed.  Questions have been answered.  All parties agreeable.  Please see the history and physical in the medical record for more information.  MEDICATIONS: Versed 4 mg IV and Demerol 50 mg IV in divided doses. Zofran 4 mg IV. Xylocaine gel orally  DESCRIPTION OF PROCEDURE:   The EQ-6834H ([id number])  endoscope was introduced through the mouth and advanced to the second portion of the duodenum without difficulty or limitations.  The mucosal surfaces were surveyed very carefully during advancement of the scope and upon withdrawal.  Retroflexion view of the proximal stomach and esophagogastric junction was performed.      FINDINGS: No esophageal varices. Esophagitis much improved. Had the some erosions within 2-3 mm at the GE junction. No Barrett's esophagus. Stomach empty. Small hiatal hernia. portal gastropathy.Patchy erythema of the gastric antrum pyloric channel. Previously noted ulcers completely healed. Normal first and second portion of the duodenum.  THERAPEUTIC / DIAGNOSTIC MANEUVERS PERFORMED:  None   COMPLICATIONS:  None  IMPRESSION:  Erosive reflux esophagitis-much improved over that seen previously. Hiatal hernia. Previously noted gastric ulcer completely healed.  Portal gastropathy  RECOMMENDATIONS:  Continue to refrain from taking nonsteroidal agents. Decrease Protonix to 40 mg daily.  See colonoscopy  report.    _______________________________ R. Garfield Cornea, MD FACP [facility] eSigned:  R. Garfield Cornea, MD FACP [facility] 06/24/2013 12:10 PM     CC:

## 2013-06-24 NOTE — Interval H&P Note (Signed)
History and Physical Interval Note:  06/24/2013 11:43 AM  [name]  has presented today for surgery, with the diagnosis of H/O PUD, CONSTIPATION, RECTAL BLEEDING, ABD PAIN  The various methods of treatment have been discussed with the patient and family. After consideration of risks, benefits and other options for treatment, the patient has consented to  Procedure(s) with comments: COLONOSCOPY WITH ESOPHAGOGASTRODUODENOSCOPY (EGD) (N/A) - 10:30AM as a surgical intervention .  The patient's history has been reviewed, patient examined, no change in status, stable for surgery.  I have reviewed the patient's chart and labs.  Questions were answered to the patient's satisfaction.   no change. EGD and colonoscopy per plan.  The risks, benefits, limitations, alternatives and imponderables have been reviewed with the patient. Potential for esophageal dilation, biopsy, etc. have also been reviewed.  Questions have been answered. All parties agreeable.  Manus Rudd

## 2013-06-27 ENCOUNTER — Ambulatory Visit: Payer: Medicare Other | Admitting: Gastroenterology

## 2013-06-28 ENCOUNTER — Encounter: Payer: Self-pay | Admitting: Internal Medicine

## 2013-06-29 ENCOUNTER — Encounter (HOSPITAL_COMMUNITY): Payer: Self-pay | Admitting: Internal Medicine

## 2013-07-21 LAB — AFB CULTURE WITH SMEAR (NOT AT ARMC): Acid Fast Smear: NONE SEEN

## 2013-07-28 ENCOUNTER — Telehealth: Payer: Self-pay | Admitting: *Deleted

## 2013-07-28 NOTE — Telephone Encounter (Signed)
Pt called stating she has very bad abd pain on her left side, pt said she is getting fluid on her stomach again, pt said it is very painful pt noticed it last Thursday and it keeps getting worse as days go on. Pt is still taking her medication. Pt would like to know if Dr. Gala Romney can put a order in to have fluid drawn off. Please advise [phone]

## 2013-07-28 NOTE — Telephone Encounter (Signed)
Tried to call pt- LMOM 

## 2013-07-28 NOTE — Telephone Encounter (Signed)
Pt called back and ask if you could please call her back at [phone].

## 2013-07-29 NOTE — Telephone Encounter (Signed)
Patient with history of going for LVAP and not having significant ascites.  Let's have her come by Monday for me to quickly look for ascites. Not an OV. She is scheduled for OV in one week. If significant ascites then we will order LVAP.

## 2013-07-29 NOTE — Telephone Encounter (Signed)
Spoke with pt- she is having a lot of fluid building up again. She said her stomach is tight and she feels bloated and there is fluid in her abd and legs. She is taking rx as prescribed and watching her salt intake. Her sides are hurting but she is not c/o SOB yet. She said she didn't want it to get as bad as it was last time. Pt wants to know if we can schedule a paracentesis.

## 2013-07-29 NOTE — Telephone Encounter (Signed)
Pt is aware, asked her to come by early  Monday morning. Informed her that LSL wont be here Monday afternoon.

## 2013-08-01 ENCOUNTER — Other Ambulatory Visit: Payer: Self-pay | Admitting: Gastroenterology

## 2013-08-01 DIAGNOSIS — R188 Other ascites: Secondary | ICD-10-CM

## 2013-08-01 NOTE — Telephone Encounter (Signed)
Patient came by to have ascites evaluation. She has moderate abdominal distention. She complains of early satiety, abdominal discomfort, SOB.  I explained that she may only have 1-2 liters that can be removed but due to symptoms we would try.  Please schedule LVAP, ?tomorrow morning.  Have fluid sent for cell count with diff, culture (aerobic and anaerobic).  Keep OV for 08/05/13.

## 2013-08-01 NOTE — Telephone Encounter (Signed)
Pt is aware. Leigh ann, please schedule.

## 2013-08-01 NOTE — Telephone Encounter (Signed)
[name] is scheduled for Tuesday April 21st at 1:00 and [name] is aware

## 2013-08-02 ENCOUNTER — Ambulatory Visit (HOSPITAL_COMMUNITY): Payer: Medicare Other

## 2013-08-03 ENCOUNTER — Ambulatory Visit (HOSPITAL_COMMUNITY)
Admission: RE | Admit: 2013-08-03 | Discharge: 2013-08-03 | Disposition: A | Discharge status: Home / Self Care | Payer: Medicare Other | Source: Ambulatory Visit | Attending: Gastroenterology | Admitting: Gastroenterology

## 2013-08-03 ENCOUNTER — Other Ambulatory Visit: Payer: Self-pay | Admitting: Gastroenterology

## 2013-08-03 DIAGNOSIS — R188 Other ascites: Secondary | ICD-10-CM | POA: Insufficient documentation

## 2013-08-03 NOTE — Progress Notes (Signed)
Quick Note:  No significant ascites. Patient has appt Friday. ______

## 2013-08-04 ENCOUNTER — Telehealth: Payer: Self-pay | Admitting: Internal Medicine

## 2013-08-04 NOTE — Telephone Encounter (Signed)
Pt cancelled her OV for Friday 4/24 with LSL due to no transportation and wants to [facility] for LSL or RMR to see her one day next week. I told her that I will not have anything until June and she should try to keep her appointment for in the morning. She said that she can't and she can't wait until June to be seen. Please advise if I need to bring her in as an URG or should I just call her whenever a cancellation becomes available?

## 2013-08-05 ENCOUNTER — Ambulatory Visit: Payer: Medicare Other | Admitting: Gastroenterology

## 2013-08-05 NOTE — Telephone Encounter (Signed)
You can use one my urgents.

## 2013-08-08 NOTE — Telephone Encounter (Signed)
I have offered several different dates and times and patient couldn't come to any of them. She is able to come on May 7th at 1130 to see LSL.

## 2013-08-08 NOTE — Telephone Encounter (Signed)
Routing to SS 

## 2013-08-18 ENCOUNTER — Ambulatory Visit: Payer: Medicare Other | Admitting: Gastroenterology

## 2013-09-16 ENCOUNTER — Ambulatory Visit: Payer: Medicare Other | Admitting: Internal Medicine

## 2013-10-04 ENCOUNTER — Telehealth: Payer: Self-pay | Admitting: Internal Medicine

## 2013-10-04 NOTE — Telephone Encounter (Signed)
Pt called the office this afternoon tearful because she is swelling and it's going down into her legs, hard to breath and says she looks 9 months pregnant. She is asking for an order to have fluid drawn. Please advise [phone]

## 2013-10-04 NOTE — Telephone Encounter (Signed)
I spoke with the pt. She feels like she has a lot of fluid. She said she is miserable. She said she looks like she is 9 months pregnant. She has SOB and feels a little dizzy at times. I reminded her that the last time she didn't have enough fluid to draw off, she said this was worse than it was last time.   She wants to know if she can be scheduled to have the fluid drawn off. She would like an early AM appt if possible. She is aware that it will be tomorrow before she will hear anything and if she gets worse she will go to the ED.

## 2013-10-04 NOTE — Telephone Encounter (Signed)
Pt has called back again and LMOM. I forwarded call to JL. Pt is wanting to have fluid drawn.

## 2013-10-05 ENCOUNTER — Other Ambulatory Visit: Payer: Self-pay | Admitting: Internal Medicine

## 2013-10-05 ENCOUNTER — Ambulatory Visit (HOSPITAL_COMMUNITY): Payer: Medicare Other

## 2013-10-05 ENCOUNTER — Inpatient Hospital Stay (HOSPITAL_COMMUNITY): Admission: RE | Admit: 2013-10-05 | Payer: Medicare Other | Source: Ambulatory Visit

## 2013-10-05 DIAGNOSIS — R188 Other ascites: Secondary | ICD-10-CM

## 2013-10-05 NOTE — Telephone Encounter (Signed)
Sounds like she may need an abdominal ultrasound and tap as appropriate

## 2013-10-05 NOTE — Telephone Encounter (Signed)
US/PARA was scheduled for today at 4:00 patient cant get there this afternoon so she will wait until Friday at 2:15 instead.

## 2013-10-07 ENCOUNTER — Other Ambulatory Visit (HOSPITAL_COMMUNITY): Payer: Medicare Other

## 2013-10-07 ENCOUNTER — Encounter (HOSPITAL_COMMUNITY): Payer: Self-pay

## 2013-10-07 ENCOUNTER — Ambulatory Visit (HOSPITAL_COMMUNITY): Payer: Medicare Other

## 2013-10-07 ENCOUNTER — Ambulatory Visit (HOSPITAL_COMMUNITY)
Admission: RE | Admit: 2013-10-07 | Discharge: 2013-10-07 | Disposition: A | Discharge status: Home / Self Care | Payer: Medicare Other | Source: Ambulatory Visit | Attending: Internal Medicine | Admitting: Internal Medicine

## 2013-10-07 DIAGNOSIS — K7460 Unspecified cirrhosis of liver: Secondary | ICD-10-CM | POA: Diagnosis not present

## 2013-10-07 DIAGNOSIS — R188 Other ascites: Secondary | ICD-10-CM | POA: Diagnosis not present

## 2013-10-07 NOTE — Sedation Documentation (Signed)
Paracentesis complete no signs of distress. 1800 ml yellow colored ascites removed.

## 2013-10-07 NOTE — Procedures (Signed)
PreOperative Dx: Cirrhosis, ascites Postoperative Dx: Cirrhosis, ascites Procedure:   US guided diagnostic paracentesis Radiologist:  Thornton Papas Anesthesia:  7 ml of 1% lidocaine Specimen:  180 ml of yellow ascitic fluid EBL:   < 1 ml Complications: None

## 2013-10-10 ENCOUNTER — Telehealth: Payer: Self-pay | Admitting: Internal Medicine

## 2013-10-10 ENCOUNTER — Other Ambulatory Visit: Payer: Self-pay

## 2013-10-10 DIAGNOSIS — R109 Unspecified abdominal pain: Secondary | ICD-10-CM

## 2013-10-10 LAB — BODY FLUID CELL COUNT WITH DIFFERENTIAL
Eos, Fluid: 0 %
Lymphs, Fluid: 11 %
MONOCYTE-MACROPHAGE-SEROUS FLUID: 84 % (ref 50–90)
Neutrophil Count, Fluid: 5 % (ref 0–25)
Total Nucleated Cell Count, Fluid: 175 cu mm (ref 0–1000)

## 2013-10-10 LAB — PATHOLOGIST SMEAR REVIEW

## 2013-10-10 MED ORDER — PROMETHAZINE HCL 12.5 MG PO TABS: 12.5000 mg | ORAL_TABLET | Freq: Four times a day (QID) | ORAL | Status: DC | PRN

## 2013-10-10 NOTE — Addendum Note (Signed)
Addended by: Claudina Lick on: 10/10/2013 05:21 PM   Modules accepted: Orders

## 2013-10-10 NOTE — Telephone Encounter (Signed)
Spoke with pt- she had a paracentesis done on 10/07/13. She is having nausea, vomiting, feels weak, hurts badly on her left side, has lower abd cramping. Pt is still taking her lactulose and had 2 small bm's this morning and had blood in her stool. Her urine is dark and sometimes it hurts when she urinates. Pt is out of her phenergan, she cannot take zofran because she had a reaction to it in the hospital. Pt wants to know what she can do now. She is only able to keep down small sips of ginger ale. She has been able to keep her medicine down and some dry toast. She has been like this for 4 days.

## 2013-10-10 NOTE — Telephone Encounter (Signed)
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 Will do reduced dose Phenergan. Needs to monitor for sedative effects. Let's go ahead and grab a UA with culture. Take small sips of liquids, advance as tolerated. Follow a soft diet. If significant rectal bleeding, seek medical attention.

## 2013-10-10 NOTE — Telephone Encounter (Signed)
Patient is c/o left side abdominal pain and some rectal bleeding, lower abdominal cramping, c/o nauseated, out of phenergan, please advise?

## 2013-10-10 NOTE — Addendum Note (Signed)
Addended by: Orvil Feil on: 10/10/2013 04:26 PM   Modules accepted: Orders

## 2013-10-10 NOTE — Telephone Encounter (Signed)
Pt is aware. Lab order faxed to the lab. She said she would go have it done first thing in the morning.

## 2013-10-11 LAB — BODY FLUID CULTURE
CULTURE: NO GROWTH
GRAM STAIN: NONE SEEN

## 2013-10-11 NOTE — Progress Notes (Signed)
Quick Note:    Noted    ______

## 2013-10-12 LAB — ANAEROBIC CULTURE: Gram Stain: NONE SEEN

## 2013-10-25 ENCOUNTER — Encounter: Payer: Self-pay | Admitting: General Practice

## 2013-10-25 ENCOUNTER — Telehealth: Payer: Self-pay | Admitting: Internal Medicine

## 2013-10-25 ENCOUNTER — Ambulatory Visit: Payer: Medicare Other | Admitting: Internal Medicine

## 2013-10-25 ENCOUNTER — Encounter: Payer: Self-pay | Admitting: Internal Medicine

## 2013-10-25 NOTE — Telephone Encounter (Signed)
Patient was scheduled today to see RMR, but the caregiver called and said that she fell and will have to call us later to reschedule. Also, she didn't make the appointments for 6/5, 5/7 and 4/24.  The last time we seen her in the office was 06/20/2013 and she had a procedure on 06/24/2013.

## 2013-10-26 ENCOUNTER — Telehealth: Payer: Self-pay | Admitting: General Practice

## 2013-10-26 NOTE — Telephone Encounter (Signed)
Message copied by Idamae Schuller on Wed Oct 26, 2013  8:00 AM ------      Message from: Daneil Dolin      Created: Tue Oct 25, 2013  3:23 PM       Time to say goodbye. Too many no shows.            Let's sent her the letter "doctor patient relationship no longer productive". ------

## 2013-10-26 NOTE — Telephone Encounter (Signed)
Discharge letter mailed  

## 2013-10-30 ENCOUNTER — Emergency Department (HOSPITAL_COMMUNITY): Payer: Medicare Other

## 2013-10-30 ENCOUNTER — Inpatient Hospital Stay (HOSPITAL_COMMUNITY)
Admission: EM | Admit: 2013-10-30 | Discharge: 2013-11-01 | DRG: 392 | Disposition: A | Discharge status: Home / Self Care | Payer: Medicare Other | Attending: Internal Medicine | Admitting: Internal Medicine

## 2013-10-30 ENCOUNTER — Encounter (HOSPITAL_COMMUNITY): Payer: Self-pay | Admitting: Emergency Medicine

## 2013-10-30 DIAGNOSIS — E872 Acidosis, unspecified: Secondary | ICD-10-CM | POA: Diagnosis present

## 2013-10-30 DIAGNOSIS — A088 Other specified intestinal infections: Principal | ICD-10-CM | POA: Diagnosis present

## 2013-10-30 DIAGNOSIS — Z79899 Other long term (current) drug therapy: Secondary | ICD-10-CM

## 2013-10-30 DIAGNOSIS — R112 Nausea with vomiting, unspecified: Secondary | ICD-10-CM

## 2013-10-30 DIAGNOSIS — I959 Hypotension, unspecified: Secondary | ICD-10-CM | POA: Diagnosis present

## 2013-10-30 DIAGNOSIS — E039 Hypothyroidism, unspecified: Secondary | ICD-10-CM | POA: Diagnosis present

## 2013-10-30 DIAGNOSIS — F17200 Nicotine dependence, unspecified, uncomplicated: Secondary | ICD-10-CM | POA: Diagnosis present

## 2013-10-30 DIAGNOSIS — K766 Portal hypertension: Secondary | ICD-10-CM | POA: Diagnosis present

## 2013-10-30 DIAGNOSIS — K7689 Other specified diseases of liver: Secondary | ICD-10-CM | POA: Diagnosis present

## 2013-10-30 DIAGNOSIS — K769 Liver disease, unspecified: Secondary | ICD-10-CM

## 2013-10-30 DIAGNOSIS — E038 Other specified hypothyroidism: Secondary | ICD-10-CM

## 2013-10-30 DIAGNOSIS — H543 Unqualified visual loss, both eyes: Secondary | ICD-10-CM | POA: Diagnosis present

## 2013-10-30 DIAGNOSIS — T502X5A Adverse effect of carbonic-anhydrase inhibitors, benzothiadiazides and other diuretics, initial encounter: Secondary | ICD-10-CM | POA: Diagnosis present

## 2013-10-30 DIAGNOSIS — E860 Dehydration: Secondary | ICD-10-CM | POA: Diagnosis present

## 2013-10-30 DIAGNOSIS — K7460 Unspecified cirrhosis of liver: Secondary | ICD-10-CM | POA: Diagnosis present

## 2013-10-30 DIAGNOSIS — M129 Arthropathy, unspecified: Secondary | ICD-10-CM | POA: Diagnosis present

## 2013-10-30 DIAGNOSIS — E876 Hypokalemia: Secondary | ICD-10-CM

## 2013-10-30 DIAGNOSIS — R197 Diarrhea, unspecified: Secondary | ICD-10-CM

## 2013-10-30 DIAGNOSIS — E861 Hypovolemia: Secondary | ICD-10-CM

## 2013-10-30 DIAGNOSIS — R109 Unspecified abdominal pain: Secondary | ICD-10-CM

## 2013-10-30 DIAGNOSIS — G8929 Other chronic pain: Secondary | ICD-10-CM

## 2013-10-30 DIAGNOSIS — R531 Weakness: Secondary | ICD-10-CM | POA: Diagnosis present

## 2013-10-30 DIAGNOSIS — Z720 Tobacco use: Secondary | ICD-10-CM | POA: Diagnosis present

## 2013-10-30 LAB — CBC WITH DIFFERENTIAL/PLATELET
BASOS ABS: 0 10*3/uL (ref 0.0–0.1)
Basophils Relative: 0 % (ref 0–1)
Eosinophils Absolute: 0 10*3/uL (ref 0.0–0.7)
Eosinophils Relative: 0 % (ref 0–5)
HEMATOCRIT: 34.2 % — AB (ref 36.0–46.0)
Hemoglobin: 11.7 g/dL — ABNORMAL LOW (ref 12.0–15.0)
LYMPHS PCT: 19 % (ref 12–46)
Lymphs Abs: 1.1 10*3/uL (ref 0.7–4.0)
MCH: 36.3 pg — ABNORMAL HIGH (ref 26.0–34.0)
MCHC: 34.2 g/dL (ref 30.0–36.0)
MCV: 106.2 fL — ABNORMAL HIGH (ref 78.0–100.0)
MONO ABS: 0.5 10*3/uL (ref 0.1–1.0)
Monocytes Relative: 9 % (ref 3–12)
Neutro Abs: 4.3 10*3/uL (ref 1.7–7.7)
Neutrophils Relative %: 72 % (ref 43–77)
Platelets: 137 10*3/uL — ABNORMAL LOW (ref 150–400)
RBC: 3.22 MIL/uL — ABNORMAL LOW (ref 3.87–5.11)
RDW: 13.3 % (ref 11.5–15.5)
WBC: 5.9 10*3/uL (ref 4.0–10.5)

## 2013-10-30 LAB — COMPREHENSIVE METABOLIC PANEL
ALT: 14 U/L (ref 0–35)
AST: 61 U/L — AB (ref 0–37)
Albumin: 2.2 g/dL — ABNORMAL LOW (ref 3.5–5.2)
Alkaline Phosphatase: 278 U/L — ABNORMAL HIGH (ref 39–117)
Anion gap: 11 (ref 5–15)
CALCIUM: 7.4 mg/dL — AB (ref 8.4–10.5)
CO2: 40 meq/L — AB (ref 19–32)
CREATININE: 0.48 mg/dL — AB (ref 0.50–1.10)
Chloride: 85 mEq/L — ABNORMAL LOW (ref 96–112)
GFR calc Af Amer: >90 mL/min (ref >90)
Glucose, Bld: 100 mg/dL — ABNORMAL HIGH (ref 70–99)
Potassium: 2.6 mEq/L — CL (ref 3.7–5.3)
Sodium: 136 mEq/L — ABNORMAL LOW (ref 137–147)
Total Bilirubin: 1.5 mg/dL — ABNORMAL HIGH (ref 0.3–1.2)
Total Protein: 7.3 g/dL (ref 6.0–8.3)

## 2013-10-30 LAB — BASIC METABOLIC PANEL
Anion gap: 8 (ref 5–15)
BUN: <3 mg/dL — ABNORMAL LOW (ref 6–23)
CALCIUM: 6.8 mg/dL — AB (ref 8.4–10.5)
CO2: 39 meq/L — AB (ref 19–32)
CREATININE: 0.46 mg/dL — AB (ref 0.50–1.10)
Chloride: 87 mEq/L — ABNORMAL LOW (ref 96–112)
GFR calc Af Amer: >90 mL/min (ref >90)
GFR calc non Af Amer: >90 mL/min (ref >90)
GLUCOSE: 82 mg/dL (ref 70–99)
Potassium: 2.7 mEq/L — CL (ref 3.7–5.3)
Sodium: 134 mEq/L — ABNORMAL LOW (ref 137–147)

## 2013-10-30 LAB — URINALYSIS, ROUTINE W REFLEX MICROSCOPIC
Bilirubin Urine: NEGATIVE
Glucose, UA: NEGATIVE mg/dL
Hgb urine dipstick: NEGATIVE
Ketones, ur: NEGATIVE mg/dL
LEUKOCYTES UA: NEGATIVE
NITRITE: NEGATIVE
Protein, ur: NEGATIVE mg/dL
Urobilinogen, UA: 1 mg/dL (ref 0.0–1.0)
pH: 6 (ref 5.0–8.0)

## 2013-10-30 LAB — AMMONIA: AMMONIA: 22 umol/L (ref 11–60)

## 2013-10-30 LAB — MAGNESIUM: Magnesium: 1.1 mg/dL — ABNORMAL LOW (ref 1.5–2.5)

## 2013-10-30 LAB — LIPASE, BLOOD: LIPASE: 6 U/L — AB (ref 11–59)

## 2013-10-30 LAB — LACTIC ACID, PLASMA: Lactic Acid, Venous: 3.5 mmol/L — ABNORMAL HIGH (ref 0.5–2.2)

## 2013-10-30 LAB — PRO B NATRIURETIC PEPTIDE: Pro B Natriuretic peptide (BNP): 111.9 pg/mL (ref 0–125)

## 2013-10-30 MED ORDER — HYDROMORPHONE HCL PF 1 MG/ML IJ SOLN
1.0000 mg | Freq: Once | INTRAMUSCULAR | Status: AC
  Administered 2013-10-30: 1 mg via INTRAVENOUS
  Filled 2013-10-30: qty 1

## 2013-10-30 MED ORDER — ONDANSETRON HCL 4 MG/2ML IJ SOLN
4.0000 mg | Freq: Once | INTRAMUSCULAR | Status: AC
  Administered 2013-10-30: 4 mg via INTRAVENOUS
  Filled 2013-10-30: qty 2

## 2013-10-30 MED ORDER — POTASSIUM CHLORIDE 10 MEQ/100ML IV SOLN: 10.0000 meq | INTRAVENOUS | Status: DC

## 2013-10-30 MED ORDER — ONDANSETRON HCL 4 MG/2ML IJ SOLN: 4.0000 mg | Freq: Three times a day (TID) | INTRAMUSCULAR | Status: DC | PRN

## 2013-10-30 MED ORDER — IOHEXOL 300 MG/ML  SOLN
50.0000 mL | Freq: Once | INTRAMUSCULAR | Status: AC | PRN
  Administered 2013-10-30: 50 mL via ORAL

## 2013-10-30 MED ORDER — POTASSIUM CHLORIDE 10 MEQ/100ML IV SOLN
INTRAVENOUS | Status: AC
  Administered 2013-10-30: 10 meq via INTRAVENOUS
  Filled 2013-10-30: qty 100

## 2013-10-30 MED ORDER — ONDANSETRON HCL 4 MG/2ML IJ SOLN
4.0000 mg | Freq: Four times a day (QID) | INTRAMUSCULAR | Status: DC | PRN
  Administered 2013-10-30 – 2013-11-01 (×5): 4 mg via INTRAVENOUS
  Filled 2013-10-30 (×5): qty 2

## 2013-10-30 MED ORDER — POTASSIUM CHLORIDE 10 MEQ/100ML IV SOLN
10.0000 meq | INTRAVENOUS | Status: AC
  Administered 2013-10-30 – 2013-10-31 (×3): 10 meq via INTRAVENOUS
  Filled 2013-10-30 (×2): qty 100

## 2013-10-30 MED ORDER — SODIUM CHLORIDE 0.9 % IJ SOLN
3.0000 mL | Freq: Two times a day (BID) | INTRAMUSCULAR | Status: DC
  Administered 2013-10-31 (×2): 3 mL via INTRAVENOUS

## 2013-10-30 MED ORDER — FOLIC ACID 1 MG PO TABS
1.0000 mg | ORAL_TABLET | Freq: Every day | ORAL | Status: DC
  Administered 2013-10-31 – 2013-11-01 (×2): 1 mg via ORAL
  Filled 2013-10-30 (×2): qty 1

## 2013-10-30 MED ORDER — FENTANYL CITRATE 0.05 MG/ML IJ SOLN
25.0000 ug | Freq: Once | INTRAMUSCULAR | Status: AC
  Administered 2013-10-30: 25 ug via INTRAVENOUS
  Filled 2013-10-30: qty 2

## 2013-10-30 MED ORDER — PANTOPRAZOLE SODIUM 40 MG PO TBEC
40.0000 mg | DELAYED_RELEASE_TABLET | Freq: Two times a day (BID) | ORAL | Status: DC
  Administered 2013-10-31 – 2013-11-01 (×4): 40 mg via ORAL
  Filled 2013-10-30 (×4): qty 1

## 2013-10-30 MED ORDER — OXYCODONE HCL 5 MG PO TABS
5.0000 mg | ORAL_TABLET | ORAL | Status: DC | PRN
  Administered 2013-10-30 – 2013-11-01 (×8): 5 mg via ORAL
  Filled 2013-10-30 (×8): qty 1

## 2013-10-30 MED ORDER — IOHEXOL 300 MG/ML  SOLN
100.0000 mL | Freq: Once | INTRAMUSCULAR | Status: AC | PRN
  Administered 2013-10-30: 100 mL via INTRAVENOUS

## 2013-10-30 MED ORDER — POTASSIUM CHLORIDE 10 MEQ/100ML IV SOLN
10.0000 meq | Freq: Once | INTRAVENOUS | Status: AC
  Administered 2013-10-30: 10 meq via INTRAVENOUS

## 2013-10-30 MED ORDER — MORPHINE SULFATE 2 MG/ML IJ SOLN
1.0000 mg | INTRAMUSCULAR | Status: DC | PRN
  Administered 2013-10-30 – 2013-11-01 (×11): 1 mg via INTRAVENOUS
  Filled 2013-10-30 (×11): qty 1

## 2013-10-30 MED ORDER — POTASSIUM CHLORIDE 10 MEQ/100ML IV SOLN
10.0000 meq | Freq: Once | INTRAVENOUS | Status: AC
Start: 2013-10-30 — End: 2013-10-30
  Administered 2013-10-30: 10 meq via INTRAVENOUS
  Filled 2013-10-30: qty 100

## 2013-10-30 MED ORDER — POTASSIUM CHLORIDE 10 MEQ/100ML IV SOLN
10.0000 meq | INTRAVENOUS | Status: AC
  Administered 2013-10-30 – 2013-10-31 (×3): 10 meq via INTRAVENOUS
  Filled 2013-10-30 (×2): qty 100

## 2013-10-30 MED ORDER — SODIUM CHLORIDE 0.9 % IV BOLUS (SEPSIS)
500.0000 mL | Freq: Once | INTRAVENOUS | Status: AC
  Administered 2013-10-30: 1000 mL via INTRAVENOUS

## 2013-10-30 MED ORDER — POTASSIUM CHLORIDE IN NACL 20-0.9 MEQ/L-% IV SOLN
INTRAVENOUS | Status: AC
  Administered 2013-10-31: 04:00:00 via INTRAVENOUS

## 2013-10-30 MED ORDER — LACTULOSE 10 GM/15ML PO SOLN
20.0000 g | Freq: Two times a day (BID) | ORAL | Status: DC
  Administered 2013-10-30 – 2013-10-31 (×2): 20 g via ORAL
  Filled 2013-10-30 (×3): qty 30

## 2013-10-30 MED ORDER — SODIUM CHLORIDE 0.9 % IV BOLUS (SEPSIS)
250.0000 mL | Freq: Once | INTRAVENOUS | Status: AC
  Administered 2013-10-30: 250 mL via INTRAVENOUS

## 2013-10-30 MED ORDER — PREGABALIN 50 MG PO CAPS
100.0000 mg | ORAL_CAPSULE | Freq: Every day | ORAL | Status: DC
  Administered 2013-10-30 – 2013-10-31 (×2): 100 mg via ORAL
  Filled 2013-10-30 (×2): qty 2

## 2013-10-30 MED ORDER — HYDROMORPHONE HCL PF 1 MG/ML IJ SOLN: 1.0000 mg | INTRAMUSCULAR | Status: DC | PRN

## 2013-10-30 MED ORDER — LEVOTHYROXINE SODIUM 50 MCG PO TABS
50.0000 ug | ORAL_TABLET | Freq: Every day | ORAL | Status: DC
  Administered 2013-10-31 – 2013-11-01 (×2): 50 ug via ORAL
  Filled 2013-10-30 (×2): qty 1

## 2013-10-30 MED ORDER — SODIUM CHLORIDE 0.9 % IV SOLN
INTRAVENOUS | Status: DC
  Administered 2013-10-30: 100 mL/h via INTRAVENOUS

## 2013-10-30 MED ORDER — ALBUTEROL SULFATE (2.5 MG/3ML) 0.083% IN NEBU: 2.5000 mg | INHALATION_SOLUTION | RESPIRATORY_TRACT | Status: DC | PRN

## 2013-10-30 MED ORDER — SODIUM CHLORIDE 0.9 % IV SOLN
INTRAVENOUS | Status: DC
  Administered 2013-10-30: 20:00:00 via INTRAVENOUS

## 2013-10-30 MED ORDER — NICOTINE 14 MG/24HR TD PT24
14.0000 mg | MEDICATED_PATCH | Freq: Every day | TRANSDERMAL | Status: DC
  Filled 2013-10-30: qty 1

## 2013-10-30 MED ORDER — ONDANSETRON HCL 4 MG PO TABS: 4.0000 mg | ORAL_TABLET | Freq: Four times a day (QID) | ORAL | Status: DC | PRN

## 2013-10-30 MED ORDER — PREGABALIN 50 MG PO CAPS
50.0000 mg | ORAL_CAPSULE | Freq: Every day | ORAL | Status: DC
  Administered 2013-10-31 – 2013-11-01 (×2): 50 mg via ORAL
  Filled 2013-10-30 (×2): qty 1

## 2013-10-30 NOTE — ED Notes (Signed)
Pt is asking for more pain medication, MD notified, does not want to continue to give pt pain medications due to  her B/P

## 2013-10-30 NOTE — ED Notes (Signed)
Pt states area above IV is itching pt has a small amount of redness to area, however, pt was scratching area prior to evaluation. There is no swelling noted to area and IV flushes easily.

## 2013-10-30 NOTE — ED Notes (Signed)
CRITICAL VALUE ALERT  Critical value received:  K+-2.6  Co2-40  Date of notification: 10/30/2013  Time of notification:  0149  Critical value read back:Yes.    Nurse who received alert: Ernestene Mention  Responding MD:  Dr.Zackowski  Time MD responded:  1710

## 2013-10-30 NOTE — H&P (Signed)
[facility] History and Physical  [name] FFM:[id number] DOB: [birth year] DOA: 10/30/2013   PCP: Celedonio Savage, MD  Specialists: She is followed by Dr. Gala Romney with gastroenterology  Chief Complaint: Nausea, vomiting, diarrhea, and left-sided abdominal pain  HPI: [name] is a 53 y.o. 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 It is located in the upper part of the abdomen and radiates down to the lower abdomen. It's a sharp pain, 10 out of 10 in intensity. There are no precipitating, aggravating or relieving factors. And, then over this weekend she developed nausea, and has had numerous episodes of vomiting. Denies any blood in the emesis. She's also had loose, watery stools, about 5-6 episodes. Denies any blood in the stool either. She thinks she may have had a low-grade fever over this weekend. Denies any discomfort with urination. She is very anxious. She's had generalized weakness, which has been worse over the last 2-3 days. Review of her records suggest that this left-sided abdominal pain is a chronic issue for her. She had similar pain 3-4 months ago. She has had upper endoscopy and colonoscopy in March. Colonoscopy showed diverticulosis, hemorrhoids. Upper endoscopy showed improved is a fasciitis, healed gastric ulcer and hiatal hernia. It was felt that her pain was related to ascites. She has undergone paracentesis. She underwent a diagnostic tap a few days ago.  Home Medications: Prior to Admission medications   Medication Sig Start Date End Date Taking? Authorizing Provider  folic acid (FOLVITE) 1 MG tablet Take 1 tablet (1 mg total) by mouth daily. 03/26/13  Yes Samuella Cota, MD  furosemide (LASIX) 40 MG  tablet Take 1 tablet (40 mg total) by mouth 2 (two) times daily. 06/14/13  Yes Nita Sells, MD  lactulose (CHRONULAC) 10 GM/15ML solution Take 30 mLs (20 g total) by mouth 2 (two) times daily. 06/20/13  Yes Mahala Menghini, PA-C  levothyroxine (SYNTHROID, LEVOTHROID) 50 MCG tablet Take 50 mcg by mouth daily before breakfast.   Yes Historical Provider, MD  morphine (MSIR) 15 MG tablet Take 15 mg by mouth 4 (four) times daily.   Yes Historical Provider, MD  pantoprazole (PROTONIX) 40 MG tablet Take 1 tablet (40 mg total) by mouth 2 (two) times daily before a meal. 03/26/13  Yes Samuella Cota, MD  polyethylene glycol (MIRALAX / GLYCOLAX) packet Take 17 g by mouth daily.   Yes Historical Provider, MD  potassium chloride SA (K-DUR,KLOR-CON) 20 MEQ tablet Take 1 tablet (20 mEq total) by mouth daily. 04/24/13  Yes Rexene Alberts, MD  pregabalin (LYRICA) 100 MG capsule Take 100 mg by mouth at bedtime.    Yes Historical Provider, MD  pregabalin (LYRICA) 50 MG capsule Take 50 mg by mouth daily.    Yes Historical Provider, MD  promethazine (PHENERGAN) 12.5 MG tablet Take 1 tablet (12.5 mg total) by mouth every 6 (six) hours as needed for nausea or vomiting. 10/10/13  Yes Orvil Feil, NP  sennosides-docusate sodium (SENOKOT-S) 8.6-50 MG tablet Take 2 tablets by mouth daily.   Yes Historical Provider, MD  spironolactone (ALDACTONE) 100 MG tablet Take 1 tablet (100 mg total) by mouth 2 (two) times daily. 06/14/13  Yes Nita Sells, MD  cyanocobalamin (,VITAMIN B-12,)  1000 MCG/ML injection Inject 1,000 mcg into the muscle every 30 (thirty) days. Sunday    Historical Provider, MD    Allergies:  Allergies  Allergen Reactions  . Neurontin [Gabapentin] Anaphylaxis  . Acetaminophen Itching and Nausea And Vomiting  . Aspirin     Intolerance because of peptic ulcer  . Eggs Or Egg-Derived Products Nausea And Vomiting  . Tramadol     Nausea and vomiting  . Codeine Itching and Nausea And Vomiting     Patient is able to take morphine, demerol and fentanyl with out any type of side effects    Past Medical History: Past Medical History  Diagnosis Date  . Guillain-Barre   . Blind   . Neuropathy   . Thyroid disease   . Back pain   . Arthritis   . Seizures   . UTI (lower urinary tract infection)   . Anxiety   . Depression   . DDD (degenerative disc disease), lumbar   . Hepatitis C antibody test positive     HCV RNA is negative 03/2013  . Mild diastolic dysfunction 0/35/5974  . Esophagitis, erosive 03/2014  . Hepatic steatosis 04/23/2013  . Hypomagnesemia 04/23/2013  . Cirrhosis of liver without mention of alcohol     ct on 06/06/13= Subtle nodular contour of the liver. Cirrhosis cannot be excluded.   . PUD (peptic ulcer disease)   . Tubular adenoma     Past Surgical History  Procedure Laterality Date  . Cholecystectomy    . Tubal ligation    . Femur fracture surgery    . Ankle surgery    . Back surgery    . Esophagogastroduodenoscopy N/A 03/21/2013    Dr. Gala Romney: severe erosive reflux esophagitis, hiatal hernia, gastric ulcer with element of partial gastric outlet obstruction secondary to ulcer effect. Likely r/t NSAIDs. negative H.pylori  . Colonoscopy with esophagogastroduodenoscopy (egd) N/A 06/24/2013    RMR: Erosive reflux esophagitis-much improved over that seen previously. Hiatal hernia. Previously noted gastric ulcer completely healed.  Portal gastropathy/TCS:Rectal varices/anal canal hemorrhoids. Portal colopathy.Colonic diverticulosis. Colonic polyp-removed as above    Social History: She lives in [place] with her husband. Smokes half a pack of cigarettes a daily basis. Denies any alcohol use. No illicit drug use. Uses a walker to ambulate usually. Over the last few days due to more profound weakness she has required a wheelchair.  Family History:  Family History  Problem Relation Age of Onset  . Colon cancer Neg Hx   . Kidney disease Mother      Review of Systems -  History obtained from the patient General ROS: positive for  - fatigue Psychological ROS: positive for - anxiety Ophthalmic ROS: negative ENT ROS: negative Allergy and Immunology ROS: negative Hematological and Lymphatic ROS: negative Endocrine ROS: negative Respiratory ROS: no cough, shortness of breath, or wheezing Cardiovascular ROS: no chest pain or dyspnea on exertion Gastrointestinal ROS: as in hpi Genito-Urinary ROS: no dysuria, trouble voiding, or hematuria Musculoskeletal ROS: negative Neurological ROS: no TIA or stroke symptoms Dermatological ROS: negative  Physical Examination  Filed Vitals:   10/30/13 1700 10/30/13 1800 10/30/13 1834 10/30/13 1903  BP: 94/63 91/66 88/71  86/65  Pulse: 102  96 96  Temp:   98.7 F (37.1 C) 98.1 F (36.7 C)  TempSrc:   Oral Oral  Resp: 19 12 11 13   Height:      Weight:      SpO2: 98%  100% 99%    BP 86/65  Pulse 96  Temp(Src) 98.1 F (36.7 C) (Oral)  Resp 13  Ht 5' 4"  (1.626 m)  Wt 76.204 kg (168 lb)  BMI 28.82 kg/m2  SpO2 99%  General appearance: alert, cooperative, appears stated age, no distress and moderately obese Head: Normocephalic, without obvious abnormality, atraumatic Eyes: conjunctivae/corneas clear. PERRL, EOM's intact.  Throat: dry mm Neck: no adenopathy, no carotid bruit, no JVD, supple, symmetrical, trachea midline and thyroid not enlarged, symmetric, no tenderness/mass/nodules Resp: clear to auscultation bilaterally Cardio: regular rate and rhythm, S1, S2 normal, no murmur, click, rub or gallop GI: Abdomen is obese. Tender in the left side without any rebound, rigidity, or guarding. No masses, organomegaly. Bowel sounds are present Extremities: Minimal pedal edema Pulses: 2+ and symmetric Skin: Skin color, texture, turgor normal. No rashes or lesions Lymph nodes: Cervical, supraclavicular, and axillary nodes normal. Neurologic: Alert and oriented x3. No cranial nerve deficits. Motor strength is equal  upper and lower extremities bilaterally, was able to lift both legs off the bed  Laboratory Data: Results for orders placed during the hospital encounter of 10/30/13 (from the past 48 hour(s))  URINALYSIS, ROUTINE W REFLEX MICROSCOPIC     Status: Abnormal   Collection Time    10/30/13  3:35 PM      Result Value Ref Range   Color, Urine YELLOW  YELLOW   APPearance HAZY (*) CLEAR   Specific Gravity, Urine <1.005 (*) 1.005 - 1.030   pH 6.0  5.0 - 8.0   Glucose, UA NEGATIVE  NEGATIVE mg/dL   Hgb urine dipstick NEGATIVE  NEGATIVE   Bilirubin Urine NEGATIVE  NEGATIVE   Ketones, ur NEGATIVE  NEGATIVE mg/dL   Protein, ur NEGATIVE  NEGATIVE mg/dL   Urobilinogen, UA 1.0  0.0 - 1.0 mg/dL   Nitrite NEGATIVE  NEGATIVE   Leukocytes, UA NEGATIVE  NEGATIVE   Comment: MICROSCOPIC NOT DONE ON URINES WITH NEGATIVE PROTEIN, BLOOD, LEUKOCYTES, NITRITE, OR GLUCOSE <1000 mg/dL.  COMPREHENSIVE METABOLIC PANEL     Status: Abnormal   Collection Time    10/30/13  4:12 PM      Result Value Ref Range   Sodium 136 (*) 137 - 147 mEq/L   Potassium 2.6 (*) 3.7 - 5.3 mEq/L   Comment: CRITICAL RESULT CALLED TO, READ BACK BY AND VERIFIED WITH:     MILLER,M AT 1710 ON 10/30/13 BY GODFREY,O   Chloride 85 (*) 96 - 112 mEq/L   CO2 40 (*) 19 - 32 mEq/L   Comment: CRITICAL RESULT CALLED TO, READ BACK BY AND VERIFIED WITH:     MILLER,M AT 1710 BY GODFREY,O ON 10/30/13   Glucose, Bld 100 (*) 70 - 99 mg/dL   BUN <3 (*) 6 - 23 mg/dL   Creatinine, Ser 0.48 (*) 0.50 - 1.10 mg/dL   Calcium 7.4 (*) 8.4 - 10.5 mg/dL   Total Protein 7.3  6.0 - 8.3 g/dL   Albumin 2.2 (*) 3.5 - 5.2 g/dL   AST 61 (*) 0 - 37 U/L   ALT 14  0 - 35 U/L   Alkaline Phosphatase 278 (*) 39 - 117 U/L   Total Bilirubin 1.5 (*) 0.3 - 1.2 mg/dL   GFR calc non Af Amer >90  >90 mL/min   GFR calc Af Amer >90  >90 mL/min   Comment: (NOTE)     The eGFR has been calculated using the CKD EPI equation.     This calculation has not been validated in all clinical  situations.     eGFR's  persistently <90 mL/min signify possible Chronic Kidney     Disease.   Anion gap 11  5 - 15  LIPASE, BLOOD     Status: Abnormal   Collection Time    10/30/13  4:12 PM      Result Value Ref Range   Lipase 6 (*) 11 - 59 U/L  CBC WITH DIFFERENTIAL     Status: Abnormal   Collection Time    10/30/13  4:12 PM      Result Value Ref Range   WBC 5.9  4.0 - 10.5 K/uL   RBC 3.22 (*) 3.87 - 5.11 MIL/uL   Hemoglobin 11.7 (*) 12.0 - 15.0 g/dL   HCT 34.2 (*) 36.0 - 46.0 %   MCV 106.2 (*) 78.0 - 100.0 fL   MCH 36.3 (*) 26.0 - 34.0 pg   MCHC 34.2  30.0 - 36.0 g/dL   RDW 13.3  11.5 - 15.5 %   Platelets 137 (*) 150 - 400 K/uL   Neutrophils Relative % 72  43 - 77 %   Neutro Abs 4.3  1.7 - 7.7 K/uL   Lymphocytes Relative 19  12 - 46 %   Lymphs Abs 1.1  0.7 - 4.0 K/uL   Monocytes Relative 9  3 - 12 %   Monocytes Absolute 0.5  0.1 - 1.0 K/uL   Eosinophils Relative 0  0 - 5 %   Eosinophils Absolute 0.0  0.0 - 0.7 K/uL   Basophils Relative 0  0 - 1 %   Basophils Absolute 0.0  0.0 - 0.1 K/uL  PRO B NATRIURETIC PEPTIDE     Status: None   Collection Time    10/30/13  4:12 PM      Result Value Ref Range   Pro B Natriuretic peptide (BNP) 111.9  0 - 125 pg/mL  AMMONIA     Status: None   Collection Time    10/30/13  5:24 PM      Result Value Ref Range   Ammonia 22  11 - 60 umol/L  LACTIC ACID, PLASMA     Status: Abnormal   Collection Time    10/30/13  5:24 PM      Result Value Ref Range   Lactic Acid, Venous 3.5 (*) 0.5 - 2.2 mmol/L    Radiology Reports: Dg Chest 2 View  10/30/2013   CLINICAL DATA:  Left flank pain. Abdominal pain. Chest pressure and weakness.  EXAM: CHEST  2 VIEW  COMPARISON:  Multiple exams, including 06/06/2013  FINDINGS: The heart size and mediastinal contours are within normal limits. Both lungs are clear. The visualized skeletal structures are unremarkable.  IMPRESSION: No active cardiopulmonary disease.   Electronically Signed   By: Sherryl Barters  M.D.   On: 10/30/2013 18:38   Ct Abdomen Pelvis W Contrast  10/30/2013   CLINICAL DATA:  Hepatitis-C and cirrhosis.  Diffuse abdominal pain.  EXAM: CT ABDOMEN AND PELVIS WITH CONTRAST  TECHNIQUE: Multidetector CT imaging of the abdomen and pelvis was performed using the standard protocol following bolus administration of intravenous contrast.  CONTRAST:  91m OMNIPAQUE IOHEXOL 300 MG/ML SOLN, 1021mOMNIPAQUE IOHEXOL 300 MG/ML SOLN  COMPARISON:  06/06/2013.  FINDINGS: Lung bases show no acute findings. Heart is at the upper limits of normal in size. No pericardial or pleural effusion.  Liver is severely decreased in attenuation and the margin is irregular. There are some areas of peripheral sparing. Recanalized paraumbilical vein. Cholecystectomy. Adrenal glands, kidneys, spleen, pancreas, stomach and bowel  are unremarkable. Uterus and ovaries are visualized. Small ascites, decreased from prior.  Atherosclerotic calcification of the arterial vasculature without abdominal aortic aneurysm. Retroaortic left renal vein. No pathologically enlarged lymph nodes. No worrisome lytic or sclerotic lesions  IMPRESSION: 1. Cirrhosis. 2. Small ascites, decreased significantly from 06/06/2013. 3. No additional findings to explain the patient's pain.   Electronically Signed   By: Lorin Picket M.D.   On: 10/30/2013 18:04    Electrocardiogram: Sinus rhythm at 96 beats a minute. Left axis deviation. Long QT interval is noted. Questionable Q wave in lead 3. T inversion in V2, and other nonspecific T wave changes. Similar to EKG from earlier this year in January.  Problem List  Principal Problem:   Nausea vomiting and diarrhea Active Problems:   Hypokalemia   Tobacco abuse   Nausea and vomiting in adult   Generalized weakness   Hypotension, unspecified   Left sided abdominal pain   Chronic liver disease   Assessment: This is a 53 year old, Caucasian female who presents with abdominal pain, nausea, vomiting, and  diarrhea. Abdominal pain, appears to be chronic. CT did not show any acute findings. She could have gastroenteritis as a reason for her other symptoms. She appears to have some degree of drug-seeking behavior. She is noted to be hypotensive, which is most likely due to hypovolemia compounded by use of diuretics.  Plan: #1 nausea, vomiting, and diarrhea: Most likely acute gastroenteritis. Symptomatic treatment will be provided. Since she has been hospitalized recently we will check for C. difficile.  #2 Left-sided abdominal pain: This is, for the most part, chronic. CT does not show any clear-cut etiology. Treat symptomatically for now utilizing caution with narcotics due to hypotension. She needs to follow up with GI as an outpatient. Lipase was normal.  #3 chronic liver disease, presumed to be liver cirrhosis: It doesn't appear, that she's had definitive diagnostic testing for same. This is followed by GI as an outpatient. AST is noted to be elevated along with alkaline phosphatase. Repeat in the morning. She also has history of peptic ulcer disease and is on PPIs, which will be continued.  #4 hypotension with dehydration: She'll be given IV fluids. We will hold her diuretics. She does not appear to be symptomatic with low blood pressure. Monitor her on telemetry.  #5 hypokalemia: Most likely due to her nausea, vomiting, and diarrhea. This will be repleted aggressively. Check magnesium level as well.  #6 history of hypothyroidism: Continue with l-thyroxine.  #7 lactic acidosis: Lactic acid level is mildly elevated. This is most likely due to bone depletion. There is no evidence for sepsis. Repeat in the morning.  DVT Prophylaxis: SCDs Code Status: Full code Family Communication: Discussed with the patient. No family at bedside  Disposition Plan: Admit to telemetry   Further management decisions will depend on results of further testing and patient's response to  treatment.   [facility]  [facility] Pager [phone]  If 7PM-7AM, please contact night-coverage [email or web address] Password [facility]  10/30/2013, 7:44 PM  Disclaimer: This note was dictated with voice recognition software. Similar sounding words can inadvertently be transcribed and may not be corrected upon review.

## 2013-10-30 NOTE — ED Provider Notes (Signed)
CSN: [id number]     Arrival date & time 10/30/13  1402 History   This chart was scribed for [name], MD by Martinique Peace, ED Scribe. The patient was seen in [facility]. The patient's care was started at 3:15 PM.    Chief Complaint  Patient presents with  . Abdominal Pain      HPI HPI Comments: [name] is a 53 y.o. female who presents to the Emergency Department complaining of severe left sided abdominal pain that has been occurring over the past few weeks but states it has greatly worsened in the past week. Pt rates the pain as a 10/10. She reports that she had fluid drawn off her abdomen before and tested but everything came back negative. She also complains of associated nausea, diarrhea and 3 episodes of vomiting. She denies seeing any blood in her vomit.  Pt reports that the fluid accumulation in her abdomen is due to cirrhosis. She also reports that she may be dehydrated but no signs show this to be evident.   Past Medical History  Diagnosis Date  . Guillain-Barre   . Blind   . Neuropathy   . Thyroid disease   . Back pain   . Arthritis   . Seizures   . UTI (lower urinary tract infection)   . Anxiety   . Depression   . DDD (degenerative disc disease), lumbar   . Hepatitis C antibody test positive     HCV RNA is negative 03/2013  . Mild diastolic dysfunction 9/89/2119  . Esophagitis, erosive 03/2014  . Hepatic steatosis 04/23/2013  . Hypomagnesemia 04/23/2013  . Cirrhosis of liver without mention of alcohol     ct on 06/06/13= Subtle nodular contour of the liver. Cirrhosis cannot be excluded.   . PUD (peptic ulcer disease)   . Tubular adenoma    Past Surgical History  Procedure Laterality Date  . Cholecystectomy    . Tubal ligation    . Femur fracture surgery    . Ankle surgery    . Back surgery    . Esophagogastroduodenoscopy N/A 03/21/2013    Dr. Gala Romney: severe erosive reflux esophagitis, hiatal hernia, gastric ulcer with element of partial gastric outlet  obstruction secondary to ulcer effect. Likely r/t NSAIDs. negative H.pylori  . Colonoscopy with esophagogastroduodenoscopy (egd) N/A 06/24/2013    RMR: Erosive reflux esophagitis-much improved over that seen previously. Hiatal hernia. Previously noted gastric ulcer completely healed.  Portal gastropathy/TCS:Rectal varices/anal canal hemorrhoids. Portal colopathy.Colonic diverticulosis. Colonic polyp-removed as above   Family History  Problem Relation Age of Onset  . Colon cancer Neg Hx    History  Substance Use Topics  . Smoking status: Current Every Day Smoker -- 0.50 packs/day    Types: Cigarettes  . Smokeless tobacco: Not on file  . Alcohol Use: No     Comment: hx of ETOH abuse, none in 6 months   OB History   Grav Para Term Preterm Abortions TAB SAB Ect Mult Living                 Review of Systems  Constitutional: Positive for fever. Negative for chills.  HENT: Negative for rhinorrhea and sore throat.   Eyes: Positive for visual disturbance.  Respiratory: Positive for chest tightness and shortness of breath. Negative for cough.   Cardiovascular: Negative for chest pain.  Gastrointestinal: Positive for nausea, vomiting, abdominal pain and diarrhea.  Genitourinary: Positive for dysuria. Negative for difficulty urinating.  Musculoskeletal: Positive for back pain. Negative  for joint swelling.  Skin: Negative for rash.  Neurological: Positive for headaches.  Hematological: Bruises/bleeds easily.  Psychiatric/Behavioral: Negative for confusion.      Allergies  Neurontin; Acetaminophen; Aspirin; Eggs or egg-derived products; Tramadol; and Codeine  Home Medications   Prior to Admission medications   Medication Sig Start Date End Date Taking? Authorizing Provider  folic acid (FOLVITE) 1 MG tablet Take 1 tablet (1 mg total) by mouth daily. 03/26/13  Yes Samuella Cota, MD  furosemide (LASIX) 40 MG tablet Take 1 tablet (40 mg total) by mouth 2 (two) times daily. 06/14/13  Yes  Nita Sells, MD  lactulose (CHRONULAC) 10 GM/15ML solution Take 30 mLs (20 g total) by mouth 2 (two) times daily. 06/20/13  Yes Mahala Menghini, PA-C  levothyroxine (SYNTHROID, LEVOTHROID) 50 MCG tablet Take 50 mcg by mouth daily before breakfast.   Yes Historical Provider, MD  morphine (MSIR) 15 MG tablet Take 15 mg by mouth 4 (four) times daily.   Yes Historical Provider, MD  pantoprazole (PROTONIX) 40 MG tablet Take 1 tablet (40 mg total) by mouth 2 (two) times daily before a meal. 03/26/13  Yes Samuella Cota, MD  polyethylene glycol (MIRALAX / GLYCOLAX) packet Take 17 g by mouth daily.   Yes Historical Provider, MD  potassium chloride SA (K-DUR,KLOR-CON) 20 MEQ tablet Take 1 tablet (20 mEq total) by mouth daily. 04/24/13  Yes Rexene Alberts, MD  pregabalin (LYRICA) 100 MG capsule Take 100 mg by mouth at bedtime.    Yes Historical Provider, MD  pregabalin (LYRICA) 50 MG capsule Take 50 mg by mouth daily.    Yes Historical Provider, MD  promethazine (PHENERGAN) 12.5 MG tablet Take 1 tablet (12.5 mg total) by mouth every 6 (six) hours as needed for nausea or vomiting. 10/10/13  Yes Orvil Feil, NP  sennosides-docusate sodium (SENOKOT-S) 8.6-50 MG tablet Take 2 tablets by mouth daily.   Yes Historical Provider, MD  spironolactone (ALDACTONE) 100 MG tablet Take 1 tablet (100 mg total) by mouth 2 (two) times daily. 06/14/13  Yes Nita Sells, MD  cyanocobalamin (,VITAMIN B-12,) 1000 MCG/ML injection Inject 1,000 mcg into the muscle every 30 (thirty) days. Sunday    Historical Provider, MD   BP 97/65  Pulse 106  Temp(Src) 99.9 F (37.7 C) (Oral)  Resp 18  Ht 5\' 4"  (1.626 m)  Wt 168 lb (76.204 kg)  BMI 28.82 kg/m2  SpO2 96% Physical Exam  Nursing note and vitals reviewed. Constitutional: She is oriented to person, place, and time. She appears well-developed and well-nourished. No distress.  HENT:  Head: Normocephalic and atraumatic.  Eyes: Conjunctivae and EOM are normal.  Neck:  Neck supple. No tracheal deviation present.  Cardiovascular: Normal rate and regular rhythm.   No murmur heard. Pulmonary/Chest: Effort normal. No respiratory distress.  Abdominal: Bowel sounds are normal. She exhibits no mass. There is no tenderness. There is no guarding.  Musculoskeletal: Normal range of motion. She exhibits edema (2+ pitting edema bilaterally in legs. ).  Neurological: She is alert and oriented to person, place, and time.  Skin: Skin is warm and dry.  Bruising on both arms.   Psychiatric: She has a normal mood and affect. Her behavior is normal.    ED Course  Procedures (including critical care time) DIAGNOSTIC STUDIES: Oxygen Saturation is 96% on room air, adequate by my interpretation.    COORDINATION OF CARE: 3:22 PM- Treatment plan was discussed with patient who verbalizes understanding and agrees.  Labs Review Labs Reviewed  COMPREHENSIVE METABOLIC PANEL - Abnormal; Notable for the following:    Sodium 136 (*)    Potassium 2.6 (*)    Chloride 85 (*)    CO2 40 (*)    Glucose, Bld 100 (*)    BUN <3 (*)    Creatinine, Ser 0.48 (*)    Calcium 7.4 (*)    Albumin 2.2 (*)    AST 61 (*)    Alkaline Phosphatase 278 (*)    Total Bilirubin 1.5 (*)    All other components within normal limits  LIPASE, BLOOD - Abnormal; Notable for the following:    Lipase 6 (*)    All other components within normal limits  CBC WITH DIFFERENTIAL - Abnormal; Notable for the following:    RBC 3.22 (*)    Hemoglobin 11.7 (*)    HCT 34.2 (*)    MCV 106.2 (*)    MCH 36.3 (*)    Platelets 137 (*)    All other components within normal limits  URINALYSIS, ROUTINE W REFLEX MICROSCOPIC - Abnormal; Notable for the following:    APPearance HAZY (*)    Specific Gravity, Urine <1.005 (*)    All other components within normal limits  LACTIC ACID, PLASMA - Abnormal; Notable for the following:    Lactic Acid, Venous 3.5 (*)    All other components within normal limits  CULTURE,  BLOOD (ROUTINE X 2)  CULTURE, BLOOD (ROUTINE X 2)  PRO B NATRIURETIC PEPTIDE  AMMONIA   Results for orders placed during the hospital encounter of 10/30/13  COMPREHENSIVE METABOLIC PANEL      Result Value Ref Range   Sodium 136 (*) 137 - 147 mEq/L   Potassium 2.6 (*) 3.7 - 5.3 mEq/L   Chloride 85 (*) 96 - 112 mEq/L   CO2 40 (*) 19 - 32 mEq/L   Glucose, Bld 100 (*) 70 - 99 mg/dL   BUN <3 (*) 6 - 23 mg/dL   Creatinine, Ser 0.48 (*) 0.50 - 1.10 mg/dL   Calcium 7.4 (*) 8.4 - 10.5 mg/dL   Total Protein 7.3  6.0 - 8.3 g/dL   Albumin 2.2 (*) 3.5 - 5.2 g/dL   AST 61 (*) 0 - 37 U/L   ALT 14  0 - 35 U/L   Alkaline Phosphatase 278 (*) 39 - 117 U/L   Total Bilirubin 1.5 (*) 0.3 - 1.2 mg/dL   GFR calc non Af Amer >90  >90 mL/min   GFR calc Af Amer >90  >90 mL/min   Anion gap 11  5 - 15  LIPASE, BLOOD      Result Value Ref Range   Lipase 6 (*) 11 - 59 U/L  CBC WITH DIFFERENTIAL      Result Value Ref Range   WBC 5.9  4.0 - 10.5 K/uL   RBC 3.22 (*) 3.87 - 5.11 MIL/uL   Hemoglobin 11.7 (*) 12.0 - 15.0 g/dL   HCT 34.2 (*) 36.0 - 46.0 %   MCV 106.2 (*) 78.0 - 100.0 fL   MCH 36.3 (*) 26.0 - 34.0 pg   MCHC 34.2  30.0 - 36.0 g/dL   RDW 13.3  11.5 - 15.5 %   Platelets 137 (*) 150 - 400 K/uL   Neutrophils Relative % 72  43 - 77 %   Neutro Abs 4.3  1.7 - 7.7 K/uL   Lymphocytes Relative 19  12 - 46 %   Lymphs Abs 1.1  0.7 -  4.0 K/uL   Monocytes Relative 9  3 - 12 %   Monocytes Absolute 0.5  0.1 - 1.0 K/uL   Eosinophils Relative 0  0 - 5 %   Eosinophils Absolute 0.0  0.0 - 0.7 K/uL   Basophils Relative 0  0 - 1 %   Basophils Absolute 0.0  0.0 - 0.1 K/uL  URINALYSIS, ROUTINE W REFLEX MICROSCOPIC      Result Value Ref Range   Color, Urine YELLOW  YELLOW   APPearance HAZY (*) CLEAR   Specific Gravity, Urine <1.005 (*) 1.005 - 1.030   pH 6.0  5.0 - 8.0   Glucose, UA NEGATIVE  NEGATIVE mg/dL   Hgb urine dipstick NEGATIVE  NEGATIVE   Bilirubin Urine NEGATIVE  NEGATIVE   Ketones, ur NEGATIVE   NEGATIVE mg/dL   Protein, ur NEGATIVE  NEGATIVE mg/dL   Urobilinogen, UA 1.0  0.0 - 1.0 mg/dL   Nitrite NEGATIVE  NEGATIVE   Leukocytes, UA NEGATIVE  NEGATIVE  PRO B NATRIURETIC PEPTIDE      Result Value Ref Range   Pro B Natriuretic peptide (BNP) 111.9  0 - 125 pg/mL  AMMONIA      Result Value Ref Range   Ammonia 22  11 - 60 umol/L  LACTIC ACID, PLASMA      Result Value Ref Range   Lactic Acid, Venous 3.5 (*) 0.5 - 2.2 mmol/L    Imaging Review Dg Chest 2 View  10/30/2013   CLINICAL DATA:  Left flank pain. Abdominal pain. Chest pressure and weakness.  EXAM: CHEST  2 VIEW  COMPARISON:  Multiple exams, including 06/06/2013  FINDINGS: The heart size and mediastinal contours are within normal limits. Both lungs are clear. The visualized skeletal structures are unremarkable.  IMPRESSION: No active cardiopulmonary disease.   Electronically Signed   By: Sherryl Barters M.D.   On: 10/30/2013 18:38   Ct Abdomen Pelvis W Contrast  10/30/2013   CLINICAL DATA:  Hepatitis-C and cirrhosis.  Diffuse abdominal pain.  EXAM: CT ABDOMEN AND PELVIS WITH CONTRAST  TECHNIQUE: Multidetector CT imaging of the abdomen and pelvis was performed using the standard protocol following bolus administration of intravenous contrast.  CONTRAST:  18mL OMNIPAQUE IOHEXOL 300 MG/ML SOLN, 158mL OMNIPAQUE IOHEXOL 300 MG/ML SOLN  COMPARISON:  06/06/2013.  FINDINGS: Lung bases show no acute findings. Heart is at the upper limits of normal in size. No pericardial or pleural effusion.  Liver is severely decreased in attenuation and the margin is irregular. There are some areas of peripheral sparing. Recanalized paraumbilical vein. Cholecystectomy. Adrenal glands, kidneys, spleen, pancreas, stomach and bowel are unremarkable. Uterus and ovaries are visualized. Small ascites, decreased from prior.  Atherosclerotic calcification of the arterial vasculature without abdominal aortic aneurysm. Retroaortic left renal vein. No pathologically  enlarged lymph nodes. No worrisome lytic or sclerotic lesions  IMPRESSION: 1. Cirrhosis. 2. Small ascites, decreased significantly from 06/06/2013. 3. No additional findings to explain the patient's pain.   Electronically Signed   By: Lorin Picket M.D.   On: 10/30/2013 18:04     EKG Interpretation   Date/Time:  Sunday October 30 2013 18:20:59 EDT Ventricular Rate:  96 PR Interval:  156 QRS Duration: 76 QT Interval:  451 QTC Calculation: 570 R Axis:   -44 Text Interpretation:  Sinus rhythm Inferior infarct, old Lateral leads are  also involved Prolonged QT interval No significant change since last  tracing Confirmed by Haelyn Forgey  MD, Talli Kimmer [phone]) on 10/30/2013 6:46:07 PM  Medications  0.9 %  sodium chloride infusion (100 mL/hr Intravenous New Bag/Given 10/30/13 1631)  potassium chloride 10 mEq in 100 mL IVPB (not administered)  potassium chloride 10 mEq in 100 mL IVPB (10 mEq Intravenous New Bag/Given 10/30/13 1824)  ondansetron (ZOFRAN) injection 4 mg (4 mg Intravenous Given 10/30/13 1606)  HYDROmorphone (DILAUDID) injection 1 mg (1 mg Intravenous Given 10/30/13 1607)  sodium chloride 0.9 % bolus 250 mL (0 mLs Intravenous Stopped 10/30/13 1631)  iohexol (OMNIPAQUE) 300 MG/ML solution 50 mL (50 mLs Oral Contrast Given 10/30/13 1541)  iohexol (OMNIPAQUE) 300 MG/ML solution 100 mL (100 mLs Intravenous Contrast Given 10/30/13 1736)  ondansetron (ZOFRAN) injection 4 mg (4 mg Intravenous Given 10/30/13 1843)  fentaNYL (SUBLIMAZE) injection 25 mcg (25 mcg Intravenous Given 10/30/13 1843)    MDM   Final diagnoses:  Hypokalemia  Abdominal pain, unspecified abdominal location  Nausea and vomiting, vomiting of unspecified type    Patient with significant hypokalemia. Patient has abdominal pain somewhat chronic the left side of her abdomen. CT scan negative for cause. Patient most likely has cirrhosis based on labs no elevated ammonia level. Patient with small amount ascites nothing that needs  to be tapped. Patient will require admission for the persistent nausea and vomiting and for the hypokalemia. Patient's GI doctor is Dr. Gala Romney. Patient's primary care Dr. is Dr. Wenda Overland  Patient without findings for significant infection other some concern for that. Lactic acid is elevated but less than 4. Urine is negative blood culture sent urine culture sent no significant leukocytosis. Lactic acid to be elevated just due to the chronic liver disease.  I personally performed the services described in this documentation, which was scribed in my presence. The recorded information has been reviewed and is accurate.     [name], MD 10/30/13 [phone]

## 2013-10-30 NOTE — ED Notes (Signed)
Complain of pain in left upper quad to back. Also, nauseated

## 2013-10-31 DIAGNOSIS — E860 Dehydration: Secondary | ICD-10-CM

## 2013-10-31 DIAGNOSIS — E038 Other specified hypothyroidism: Secondary | ICD-10-CM

## 2013-10-31 LAB — COMPREHENSIVE METABOLIC PANEL
ALBUMIN: 2 g/dL — AB (ref 3.5–5.2)
ALT: 12 U/L (ref 0–35)
AST: 57 U/L — AB (ref 0–37)
Alkaline Phosphatase: 251 U/L — ABNORMAL HIGH (ref 39–117)
Anion gap: 9 (ref 5–15)
BUN: <3 mg/dL — ABNORMAL LOW (ref 6–23)
CALCIUM: 7.2 mg/dL — AB (ref 8.4–10.5)
CO2: 39 meq/L — AB (ref 19–32)
Chloride: 91 mEq/L — ABNORMAL LOW (ref 96–112)
Creatinine, Ser: 0.53 mg/dL (ref 0.50–1.10)
GFR calc Af Amer: >90 mL/min (ref >90)
GFR calc non Af Amer: >90 mL/min (ref >90)
Glucose, Bld: 101 mg/dL — ABNORMAL HIGH (ref 70–99)
Potassium: 3.1 mEq/L — ABNORMAL LOW (ref 3.7–5.3)
SODIUM: 139 meq/L (ref 137–147)
TOTAL PROTEIN: 6.8 g/dL (ref 6.0–8.3)
Total Bilirubin: 2.8 mg/dL — ABNORMAL HIGH (ref 0.3–1.2)

## 2013-10-31 LAB — CBC
HCT: 32.7 % — ABNORMAL LOW (ref 36.0–46.0)
HEMOGLOBIN: 11.1 g/dL — AB (ref 12.0–15.0)
MCH: 36.9 pg — AB (ref 26.0–34.0)
MCHC: 33.9 g/dL (ref 30.0–36.0)
MCV: 108.6 fL — AB (ref 78.0–100.0)
Platelets: 111 10*3/uL — ABNORMAL LOW (ref 150–400)
RBC: 3.01 MIL/uL — AB (ref 3.87–5.11)
RDW: 13 % (ref 11.5–15.5)
WBC: 5.3 10*3/uL (ref 4.0–10.5)

## 2013-10-31 LAB — CLOSTRIDIUM DIFFICILE BY PCR: CDIFFPCR: NEGATIVE

## 2013-10-31 MED ORDER — POTASSIUM CHLORIDE IN NACL 20-0.9 MEQ/L-% IV SOLN
INTRAVENOUS | Status: AC
  Administered 2013-10-31: 23:00:00 via INTRAVENOUS

## 2013-10-31 MED ORDER — METOCLOPRAMIDE HCL 5 MG/ML IJ SOLN
10.0000 mg | Freq: Four times a day (QID) | INTRAMUSCULAR | Status: DC
  Administered 2013-10-31 – 2013-11-01 (×5): 10 mg via INTRAVENOUS
  Filled 2013-10-31 (×5): qty 2

## 2013-10-31 MED ORDER — POTASSIUM CHLORIDE CRYS ER 20 MEQ PO TBCR
40.0000 meq | EXTENDED_RELEASE_TABLET | ORAL | Status: AC
  Administered 2013-10-31 (×2): 40 meq via ORAL
  Filled 2013-10-31: qty 2

## 2013-10-31 NOTE — Progress Notes (Signed)
OT Cancellation Note  Patient Details Name: [name] MRN: [id number] DOB: Oct 05, 1960   Cancelled Treatment:    Reason Eval/Treat Not Completed: Patient declined, no reason specified (pt sitting on BSC at time of attempted OT eval. Pt requested to defer OT eval.)  Bea Graff, MS, OTR/L [phone]  10/31/2013, 10:40 AM

## 2013-10-31 NOTE — Progress Notes (Signed)
[facility] PROGRESS NOTE  [name] QZR:[id number] DOB: 10-21-1960 DOA: 10/30/2013 PCP: Celedonio Savage, MD  Assessment/Plan: 1. Nausea and vomiting, diarrhea. No clear etiology. Possibly related to acute gastroenteritis. C. difficile is negative. We'll continue to advance diet. Continue antiemetics. Try Reglan since patient is chronically on opiates and may have some degree of gastroparesis. 2. Left-sided abdominal pain. Appears to be somewhat chronic. CT scan does not show any clear etiology. We'll likely need to followup with GI as an outpatient. 3. Chronic liver disease. Followed by GI as an outpatient. 4. Hypotension and dehydration. Likely related to her diuretics. She is receiving IV fluids. Diuretics are currently on hold. Blood pressure is low, but is improving.  5. Hypokalemia. Likely related to diuretics as well as GI losses. Continue to replace. 6. Lactic acidosis. Likely related to volume depletion. No evidence of sepsis. 7. Hypothyroidism. Continue supplementation  Code Status: full code Family Communication: discussed with patient and husband at the bedside Disposition Plan: discharge home once improved   Consultants:    Procedures:    Antibiotics:    HPI/Subjective: Complains of continued abdominal pain on the left side, no vomiting but still has nausea, no diarrhea.  Objective: Filed Vitals:   10/31/13 1440  BP: 98/60  Pulse: 88  Temp: 98.5 F (36.9 C)  Resp: 16    Intake/Output Summary (Last 24 hours) at 10/31/13 2033 Last data filed at 10/31/13 1922  Gross per 24 hour  Intake 2598.75 ml  Output    854 ml  Net 1744.75 ml   Filed Weights   10/30/13 1406 10/30/13 2203  Weight: 76.204 kg (168 lb) 79.516 kg (175 lb 4.8 oz)    Exam:   General:  NAD  Cardiovascular: S1, S2 RRR  Respiratory: CTA  B  Abdomen: soft, tender on left abdomen, nd, bs+  Musculoskeletal: no edema b/l   Data Reviewed: Basic Metabolic Panel:  Recent  Labs Lab 10/30/13 1612 10/30/13 2220 10/31/13 0552  NA 136* 134* 139  K 2.6* 2.7* 3.1*  CL 85* 87* 91*  CO2 40* 39* 39*  GLUCOSE 100* 82 101*  BUN <3* <3* <3*  CREATININE 0.48* 0.46* 0.53  CALCIUM 7.4* 6.8* 7.2*  MG  --  1.1*  --    Liver Function Tests:  Recent Labs Lab 10/30/13 1612 10/31/13 0552  AST 61* 57*  ALT 14 12  ALKPHOS 278* 251*  BILITOT 1.5* 2.8*  PROT 7.3 6.8  ALBUMIN 2.2* 2.0*    Recent Labs Lab 10/30/13 1612  LIPASE 6*    Recent Labs Lab 10/30/13 1724  AMMONIA 22   CBC:  Recent Labs Lab 10/30/13 1612 10/31/13 0552  WBC 5.9 5.3  NEUTROABS 4.3  --   HGB 11.7* 11.1*  HCT 34.2* 32.7*  MCV 106.2* 108.6*  PLT 137* 111*   Cardiac Enzymes: No results found for this basename: CKTOTAL, CKMB, CKMBINDEX, TROPONINI,  in the last 168 hours BNP (last 3 results)  Recent Labs  04/22/13 1047 10/30/13 1612  PROBNP 254.9* 111.9   CBG: No results found for this basename: GLUCAP,  in the last 168 hours  Recent Results (from the past 240 hour(s))  CULTURE, BLOOD (ROUTINE X 2)     Status: None   Collection Time    10/30/13  6:52 PM      Result Value Ref Range Status   Specimen Description LEFT ANTECUBITAL   Final   Special Requests BOTTLES DRAWN AEROBIC AND ANAEROBIC 10CC   Final  Culture NO GROWTH 1 DAY   Final   Report Status PENDING   Incomplete  CULTURE, BLOOD (ROUTINE X 2)     Status: None   Collection Time    10/30/13  6:58 PM      Result Value Ref Range Status   Specimen Description BLOOD LEFT ARM   Final   Special Requests BOTTLES DRAWN AEROBIC AND ANAEROBIC 12CC   Final   Culture NO GROWTH 1 DAY   Final   Report Status PENDING   Incomplete  CLOSTRIDIUM DIFFICILE BY PCR     Status: None   Collection Time    10/31/13 11:05 AM      Result Value Ref Range Status   C difficile by pcr NEGATIVE  NEGATIVE Final     Studies: Dg Chest 2 View  10/30/2013   CLINICAL DATA:  Left flank pain. Abdominal pain. Chest pressure and weakness.   EXAM: CHEST  2 VIEW  COMPARISON:  Multiple exams, including 06/06/2013  FINDINGS: The heart size and mediastinal contours are within normal limits. Both lungs are clear. The visualized skeletal structures are unremarkable.  IMPRESSION: No active cardiopulmonary disease.   Electronically Signed   By: Sherryl Barters M.D.   On: 10/30/2013 18:38   Ct Abdomen Pelvis W Contrast  10/30/2013   CLINICAL DATA:  Hepatitis-C and cirrhosis.  Diffuse abdominal pain.  EXAM: CT ABDOMEN AND PELVIS WITH CONTRAST  TECHNIQUE: Multidetector CT imaging of the abdomen and pelvis was performed using the standard protocol following bolus administration of intravenous contrast.  CONTRAST:  70mL OMNIPAQUE IOHEXOL 300 MG/ML SOLN, 163mL OMNIPAQUE IOHEXOL 300 MG/ML SOLN  COMPARISON:  06/06/2013.  FINDINGS: Lung bases show no acute findings. Heart is at the upper limits of normal in size. No pericardial or pleural effusion.  Liver is severely decreased in attenuation and the margin is irregular. There are some areas of peripheral sparing. Recanalized paraumbilical vein. Cholecystectomy. Adrenal glands, kidneys, spleen, pancreas, stomach and bowel are unremarkable. Uterus and ovaries are visualized. Small ascites, decreased from prior.  Atherosclerotic calcification of the arterial vasculature without abdominal aortic aneurysm. Retroaortic left renal vein. No pathologically enlarged lymph nodes. No worrisome lytic or sclerotic lesions  IMPRESSION: 1. Cirrhosis. 2. Small ascites, decreased significantly from 06/06/2013. 3. No additional findings to explain the patient's pain.   Electronically Signed   By: Lorin Picket M.D.   On: 10/30/2013 18:04    Scheduled Meds: . folic acid  1 mg Oral Daily  . lactulose  20 g Oral BID  . levothyroxine  50 mcg Oral QAC breakfast  . metoCLOPramide (REGLAN) injection  10 mg Intravenous 4 times per day  . nicotine  14 mg Transdermal Daily  . pantoprazole  40 mg Oral BID AC  . potassium chloride  40  mEq Oral Q3H  . pregabalin  100 mg Oral QHS  . pregabalin  50 mg Oral Daily  . sodium chloride  3 mL Intravenous Q12H   Continuous Infusions: . 0.9 % NaCl with KCl 20 mEq / L      Principal Problem:   Nausea vomiting and diarrhea Active Problems:   Hypokalemia   Tobacco abuse   Nausea and vomiting in adult   Generalized weakness   Hypotension, unspecified   Left sided abdominal pain   Chronic liver disease    Time spent: 6mins    Tracyann Duffell  [facility] Pager [phone]. If 7PM-7AM, please contact night-coverage at [email or web address], password [facility] 10/31/2013, 8:33 PM  LOS: 1 day

## 2013-10-31 NOTE — Evaluation (Signed)
Physical Therapy Evaluation Patient Details Name: [name] MRN: [id number] DOB: [birth year] Today's Date: 10/31/2013   History of Present Illness  [name] is a 53 y.o. 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 It is located in the upper part of the abdomen and radiates down to the lower abdomen. It's a sharp pain, 10 out of 10 in intensity. There are no precipitating, aggravating or relieving factors. And, then over this weekend she developed nausea, and has had numerous episodes of vomiting. Denies any blood in the emesis. She's also had loose, watery stools, about 5-6 episodes. Denies any blood in the stool either. She thinks she may have had a low-grade fever over this weekend. Denies any discomfort with urination. She is very anxious. She's had generalized weakness, which has been worse over the last 2-3 days. Review of her records suggest that this left-sided abdominal pain is a chronic issue for her. She had similar pain 3-4 months ago. She has had upper endoscopy and colonoscopy in March. Colonoscopy showed diverticulosis, hemorrhoids. Upper endoscopy showed improved is a fasciitis, healed gastric ulcer and hiatal hernia. It was felt that her pain was related to ascites. She has undergone paracentesis. She underwent a diagnostic tap a few days ago.  Clinical Impression  Pt is a 53 year old female who presents to physical therapy for assessment of functional mobility skills secondary to dx of N/V and diarrhea.  Pt currently on contact enteric contact precautions.  Pt has a significant hx of Jossie Ng and reports she is in an exacerbated state right now since October 2014, and has required assist of husband in the morning  to get out of bed to W/C due to stiffness.  Pt reports as stiffness decreases she has been able to transfers and amb in the home mod (I) with use of rollator.  Since onset of N/V pt reports she has not wanted to move as it creates pain in the Lt side of abdomen.  Pt refused OOB activities with PT today.  Educated pt on importance of OOB activities to decrease decline in strength and activity tolerance, though pt continued to refuse.  Pt was able to roll to Rt and Lt in bed without assist, and perform SLR without lag, heel slides/ankle pumps/hip abduction in supine without assistance.  Recommend continued PT while in the hospital to address strengthening, activity tolerance for improvement of functional mobility for safe return home.  Unable to assess if pt will need SNF placement as pt refused OOB activities, though pt did state she did not want to go to rehab center and would prefer HHPT services.  No DME recommendations at this time.     Follow Up Recommendations Home health PT (Unable to fully assess today as pt refused OOB activities.  Pt reports pain in Lt abdominal region limits movement, though she would be able to perform activities in pain was decreased.  Pt refused SNF, and would prefer HHPT as she has had HHPT before. )    Equipment Recommendations  None recommended by PT       Precautions / Restrictions Precautions Precaution Comments: Guillain Barre Restrictions Weight Bearing Restrictions: No      Mobility  Bed Mobility Overal  bed mobility: Modified Independent Bed Mobility: Rolling Rolling: Modified independent (Device/Increase time)         General bed mobility comments: Pt refused all other bed mobility skills secondary to nausea/vomiting.    Transfers                 General transfer comment: Pt refused all OOB activities secondary to nausea/vomiting.   Ambulation/Gait             General Gait Details: Pt refused all OOB activities secondary to  nausea/vomiting.        Balance Overall balance assessment:  (Unable to assess as pt refused OOB activities secondary to nausea/vomiting/Lt flank pain. )                                           Pertinent Vitals/Pain Pain 8/10 Lt abdomen.  RN aware.      Home Living Family/patient expects to be discharged to:: Private residence Living Arrangements: Spouse/significant other Available Help at Discharge: Family;Available PRN/intermittently (Husband works during the day) Type of Home: House       Home Layout: One level Home Equipment: Wheelchair - Museum/gallery conservator)      Prior Function Level of Independence: Needs assistance   Gait / Transfers Assistance Needed: Pt reports due to stiffness in the morning with Jossie Ng she requires assist from husband to get in her W/C, though after the stiffness is gone is is able to complete transfers and household amb with use of rollator.                Extremity/Trunk Assessment               Lower Extremity Assessment: Generalized weakness;RLE deficits/detail;LLE deficits/detail RLE Deficits / Details: Pt refused MMT at EOB, though was able to SLR without lag, heel slide/ankle pump/hip ABD through full ROM without assistance LLE Deficits / Details: Pt refused MMT at EOB, though was able to SLR without lag, heel slide/ankle pump/hip ABD thorugh full ROM without assistance     Communication   Communication: No difficulties  Cognition Arousal/Alertness: Awake/alert Behavior During Therapy: WFL for tasks assessed/performed Overall Cognitive Status: Within Functional Limits for tasks assessed                         Exercises Total Joint Exercises Ankle Circles/Pumps: AROM;Both;10 reps;Supine Heel Slides: AROM;Both;5 reps;Supine Hip ABduction/ADduction: AROM;Both;5 reps;Supine Straight Leg Raises: AROM;Both;5 reps;Supine      Assessment/Plan    PT Assessment  Patient needs continued PT services (to continue assessment of functional mobility skills)  PT Diagnosis Difficulty walking;Generalized weakness   PT Problem List Decreased mobility;Decreased activity tolerance  PT Treatment Interventions Balance training;Gait training;Neuromuscular re-education;Functional mobility training;Therapeutic activities;Therapeutic exercise;Patient/family education   PT Goals (Current goals can be found in the Care Plan section) Acute Rehab PT Goals Patient Stated Goal: decrease pain  PT Goal Formulation: With patient Time For Goal Achievement: 11/14/13 Potential to Achieve Goals: Good    Frequency Min 3X/week    End of Session   Activity Tolerance: Patient limited by pain Patient left: in bed;with call bell/phone within reach;with bed alarm set           Time: 3154-0086 PT Time Calculation (min): 23 min   Charges:   PT Evaluation $Initial PT Evaluation Tier I: 1 Procedure  Mildreth Reek 10/31/2013, 2:52 PM

## 2013-10-31 NOTE — Progress Notes (Deleted)
.  tbu  

## 2013-10-31 NOTE — Progress Notes (Signed)
UR chart review completed.  

## 2013-11-01 DIAGNOSIS — E861 Hypovolemia: Secondary | ICD-10-CM

## 2013-11-01 DIAGNOSIS — G8929 Other chronic pain: Secondary | ICD-10-CM

## 2013-11-01 LAB — BASIC METABOLIC PANEL
Anion gap: 8 (ref 5–15)
BUN: 3 mg/dL — AB (ref 6–23)
CHLORIDE: 96 meq/L (ref 96–112)
CO2: 36 mEq/L — ABNORMAL HIGH (ref 19–32)
CREATININE: 0.49 mg/dL — AB (ref 0.50–1.10)
Calcium: 7.1 mg/dL — ABNORMAL LOW (ref 8.4–10.5)
GFR calc Af Amer: >90 mL/min (ref >90)
GFR calc non Af Amer: >90 mL/min (ref >90)
Glucose, Bld: 82 mg/dL (ref 70–99)
POTASSIUM: 3.2 meq/L — AB (ref 3.7–5.3)
Sodium: 140 mEq/L (ref 137–147)

## 2013-11-01 LAB — CBC
HEMATOCRIT: 29 % — AB (ref 36.0–46.0)
Hemoglobin: 9.7 g/dL — ABNORMAL LOW (ref 12.0–15.0)
MCH: 36.6 pg — ABNORMAL HIGH (ref 26.0–34.0)
MCHC: 33.4 g/dL (ref 30.0–36.0)
MCV: 109.4 fL — ABNORMAL HIGH (ref 78.0–100.0)
Platelets: 119 10*3/uL — ABNORMAL LOW (ref 150–400)
RBC: 2.65 MIL/uL — AB (ref 3.87–5.11)
RDW: 13.6 % (ref 11.5–15.5)
WBC: 4.9 10*3/uL (ref 4.0–10.5)

## 2013-11-01 MED ORDER — POTASSIUM CHLORIDE CRYS ER 20 MEQ PO TBCR: 40.0000 meq | EXTENDED_RELEASE_TABLET | Freq: Every day | ORAL | Status: DC

## 2013-11-01 MED ORDER — FUROSEMIDE 40 MG PO TABS: 40.0000 mg | ORAL_TABLET | Freq: Every day | ORAL | Status: DC

## 2013-11-01 MED ORDER — METOCLOPRAMIDE HCL 10 MG PO TABS: 10.0000 mg | ORAL_TABLET | Freq: Four times a day (QID) | ORAL | Status: DC | PRN

## 2013-11-01 MED ORDER — POTASSIUM CHLORIDE CRYS ER 20 MEQ PO TBCR
40.0000 meq | EXTENDED_RELEASE_TABLET | Freq: Once | ORAL | Status: AC
  Administered 2013-11-01: 40 meq via ORAL
  Filled 2013-11-01: qty 2

## 2013-11-01 NOTE — Care Management Note (Signed)
    Page 1 of 1   11/01/2013     11:15:05 AM CARE MANAGEMENT NOTE 11/01/2013  Patient:  [name], [name]   Account Number:  [id number]  Date Initiated:  11/01/2013  Documentation initiated by:  Theophilus Kinds  Subjective/Objective Assessment:   Pt admitted from home with nausea and vomiting. Pt lives with her husband and will return home at discharge. Pt stated a friend would be coming to help her for a few days to get back on her feet. Pt has a hospital bed, rollator, and w/c.     Action/Plan:   Pt denies any need to arrange [facility] PT at this time. Pt just wants to go home today.   Anticipated DC Date:  11/02/2013   Anticipated DC Plan:  Trainer  CM consult      Choice offered to / List presented to:             Status of service:  Completed, signed off Medicare Important Message given?   (If response is "NO", the following Medicare IM given date fields will be blank) Date Medicare IM given:   Medicare IM given by:   Date Additional Medicare IM given:   Additional Medicare IM given by:    Discharge Disposition:  HOME/SELF CARE  Per UR Regulation:    If discussed at Long Length of Stay Meetings, dates discussed:    Comments:  11/01/13 [place], RN BSN CM

## 2013-11-01 NOTE — Discharge Instructions (Signed)

## 2013-11-01 NOTE — Progress Notes (Signed)
Patient discharged home with instructions given on medications,and follow up visits,patient verbalized understanding.Prescriptions sent with patient.Vital signs stable.

## 2013-11-01 NOTE — Discharge Summary (Signed)
Physician Discharge Summary  [name]:[id number] DOB: [birth year] DOA: 10/30/2013  PCP: Celedonio Savage, MD  Admit date: 10/30/2013 Discharge date: 11/01/2013  Time spent: 40 minutes  Recommendations for Outpatient Follow-up:  1. Followup with primary care physician one to 2 weeks  Discharge Diagnoses:  Principal Problem:   Nausea vomiting and diarrhea Active Problems:   Hypokalemia   Tobacco abuse   Nausea and vomiting in adult   Generalized weakness   Hypotension, unspecified   Left sided abdominal pain   Chronic liver disease   Discharge Condition: improved  Diet recommendation: low salt  Filed Weights   10/30/13 1406 10/30/13 2203  Weight: 76.204 kg (168 lb) 79.516 kg (175 lb 4.8 oz)    History of present illness and hospital course:  This patient was admitted to the hospital with nausea, vomiting, abdominal pain diarrhea which had been present for approximately 2-3 weeks prior to admission. She felt significantly worse 2-3 days prior to admission CT scan of the abdomen and pelvis did not indicate any acute findings. Stool for Clostridium difficile was found to be negative. It was felt that she likely had a viral gastroenteritis. She was treated supportively with antiemetics, and her diet was slowly advanced to solid food. She feels significantly improved at this time is no longer having any vomiting. She was noted to be significantly hypokalemic on admission. This may be related to GI losses with associated Lasix use. She reports that her potassium usually runs lower side. We will increase her daily potassium supplementation. Restart Lasix. She is tolerating a solid diet. She is requesting discharge home. She feels that her abdominal pain, although not resolved has improved. She will follow with her primary care physician which 2 weeks.   Procedures:    Consultations:    Discharge Exam: Filed Vitals:   11/01/13 1532  BP: 92/61  Pulse: 92  Temp: 98.5 F (36.9 C)   Resp: 18    General: NAD Cardiovascular: S1, S2 RRR Respiratory: CTA B  Discharge Instructions You were cared for by a hospitalist during your hospital stay. If you have any questions about your discharge medications or the care you received while you were in the hospital after you are discharged, you can call the unit and asked to speak with the hospitalist on call if the hospitalist that took care of you is not available. Once you are discharged, your primary care physician will handle any further medical issues. Please note that NO REFILLS for any discharge medications will be authorized once you are discharged, as it is imperative that you return to your primary care physician (or establish a relationship with a primary care physician if you do not have one) for your aftercare needs so that they can reassess your need for medications and monitor your lab values.  Discharge Instructions   Call MD for:  persistant nausea and vomiting    Complete by:  As directed      Call MD for:  severe uncontrolled pain    Complete by:  As directed      Call MD for:  temperature >100.4    Complete by:  As directed      Diet - low sodium heart healthy    Complete by:  As directed      Increase activity slowly    Complete by:  As directed             Medication List         cyanocobalamin  1000 MCG/ML injection  Commonly known as:  (VITAMIN B-12)  Inject 1,000 mcg into the muscle every 30 (thirty) days. Sunday     folic acid 1 MG tablet  Commonly known as:  FOLVITE  Take 1 tablet (1 mg total) by mouth daily.     furosemide 40 MG tablet  Commonly known as:  LASIX  Take 1 tablet (40 mg total) by mouth daily.     lactulose 10 GM/15ML solution  Commonly known as:  CHRONULAC  Take 30 mLs (20 g total) by mouth 2 (two) times daily.     levothyroxine 50 MCG tablet  Commonly known as:  SYNTHROID, LEVOTHROID  Take 50 mcg by mouth daily before breakfast.     metoCLOPramide 10 MG tablet   Commonly known as:  REGLAN  Take 1 tablet (10 mg total) by mouth every 6 (six) hours as needed for nausea.     morphine 15 MG tablet  Commonly known as:  MSIR  Take 15 mg by mouth 4 (four) times daily.     pantoprazole 40 MG tablet  Commonly known as:  PROTONIX  Take 1 tablet (40 mg total) by mouth 2 (two) times daily before a meal.     polyethylene glycol packet  Commonly known as:  MIRALAX / GLYCOLAX  Take 17 g by mouth daily.     potassium chloride SA 20 MEQ tablet  Commonly known as:  K-DUR,KLOR-CON  Take 2 tablets (40 mEq total) by mouth daily.     pregabalin 50 MG capsule  Commonly known as:  LYRICA  Take 50 mg by mouth daily.     pregabalin 100 MG capsule  Commonly known as:  LYRICA  Take 100 mg by mouth at bedtime.     promethazine 12.5 MG tablet  Commonly known as:  PHENERGAN  Take 1 tablet (12.5 mg total) by mouth every 6 (six) hours as needed for nausea or vomiting.     sennosides-docusate sodium 8.6-50 MG tablet  Commonly known as:  SENOKOT-S  Take 2 tablets by mouth daily.     spironolactone 100 MG tablet  Commonly known as:  ALDACTONE  Take 1 tablet (100 mg total) by mouth 2 (two) times daily.       Allergies  Allergen Reactions  . Neurontin [Gabapentin] Anaphylaxis  . Acetaminophen Itching and Nausea And Vomiting  . Aspirin     Intolerance because of peptic ulcer  . Eggs Or Egg-Derived Products Nausea And Vomiting  . Tramadol     Nausea and vomiting  . Codeine Itching and Nausea And Vomiting    Patient is able to take morphine, demerol and fentanyl with out any type of side effects       Follow-up Information   Follow up with Celedonio Savage, MD. Schedule an appointment as soon as possible for a visit in 2 weeks.   Specialty:  Family Medicine   Contact information:   [address] [place] [address] [phone]        The results of significant diagnostics from this hospitalization (including imaging, microbiology, ancillary and  laboratory) are listed below for reference.    Significant Diagnostic Studies: Dg Chest 2 View  10/30/2013   CLINICAL DATA:  Left flank pain. Abdominal pain. Chest pressure and weakness.  EXAM: CHEST  2 VIEW  COMPARISON:  Multiple exams, including 06/06/2013  FINDINGS: The heart size and mediastinal contours are within normal limits. Both lungs are clear. The visualized skeletal structures are unremarkable.  IMPRESSION: No active cardiopulmonary disease.   Electronically Signed   By: Sherryl Barters M.D.   On: 10/30/2013 18:38   Ct Abdomen Pelvis W Contrast  10/30/2013   CLINICAL DATA:  Hepatitis-C and cirrhosis.  Diffuse abdominal pain.  EXAM: CT ABDOMEN AND PELVIS WITH CONTRAST  TECHNIQUE: Multidetector CT imaging of the abdomen and pelvis was performed using the standard protocol following bolus administration of intravenous contrast.  CONTRAST:  27mL OMNIPAQUE IOHEXOL 300 MG/ML SOLN, 181mL OMNIPAQUE IOHEXOL 300 MG/ML SOLN  COMPARISON:  06/06/2013.  FINDINGS: Lung bases show no acute findings. Heart is at the upper limits of normal in size. No pericardial or pleural effusion.  Liver is severely decreased in attenuation and the margin is irregular. There are some areas of peripheral sparing. Recanalized paraumbilical vein. Cholecystectomy. Adrenal glands, kidneys, spleen, pancreas, stomach and bowel are unremarkable. Uterus and ovaries are visualized. Small ascites, decreased from prior.  Atherosclerotic calcification of the arterial vasculature without abdominal aortic aneurysm. Retroaortic left renal vein. No pathologically enlarged lymph nodes. No worrisome lytic or sclerotic lesions  IMPRESSION: 1. Cirrhosis. 2. Small ascites, decreased significantly from 06/06/2013. 3. No additional findings to explain the patient's pain.   Electronically Signed   By: Lorin Picket M.D.   On: 10/30/2013 18:04   US Abdomen Limited  10/07/2013   CLINICAL DATA:  POSSIBLE ASCITES  EXAM: LIMITED ABDOMEN ULTRASOUND FOR  ASCITES  TECHNIQUE: Limited ultrasound survey for ascites was performed in all four abdominal quadrants.  COMPARISON:  None.  FINDINGS: Minimal amount of fluid is appreciated within Morrison's pouch and the right lower quadrant.  IMPRESSION: Minimal ascites in the abdomen.   Electronically Signed   By: Margaree Mackintosh M.D.   On: 10/07/2013 15:24   US Paracentesis  10/07/2013   CLINICAL DATA:  Cirrhosis, recurrent ascites, abdominal pain, fever, anorexia  EXAM: ULTRASOUND GUIDED DIAGNOSTIC PARACENTESIS  COMPARISON:  08/05/2013  PROCEDURE: Procedure discussed with Dr. Gala Romney.  Procedure, benefits, and risks of procedure were discussed with patient.  Written informed consent for procedure was obtained.  Time out protocol followed.  Adequate collection of ascites localized in RIGHT lower quadrant.  Skin prepped and draped in usual sterile fashion.  Skin and soft tissues anesthetized with 7 mL of 1% lidocaine.  5 [place] Yueh catheter placed into peritoneal cavity under direct sonographic visualization.  180 mL of yellow fluid aspirated by syringe for laboratory analysis.  Procedure tolerated well by patient without immediate complication.  FINDINGS: A total of approximately 180 mL of ascitic fluid was removed. A fluid sample of 180 mL was sent for laboratory analysis.  IMPRESSION: Successful ultrasound guided diagnostic paracentesis yielding 180 ml of ascites.   Electronically Signed   By: Lavonia Dana M.D.   On: 10/07/2013 16:18    Microbiology: Recent Results (from the past 240 hour(s))  CULTURE, BLOOD (ROUTINE X 2)     Status: None   Collection Time    10/30/13  6:52 PM      Result Value Ref Range Status   Specimen Description BLOOD LEFT ANTECUBITAL   Final   Special Requests BOTTLES DRAWN AEROBIC AND ANAEROBIC 10CC   Final   Culture NO GROWTH 2 DAYS   Final   Report Status PENDING   Incomplete  CULTURE, BLOOD (ROUTINE X 2)     Status: None   Collection Time    10/30/13  6:58 PM      Result Value Ref  Range Status   Specimen Description BLOOD LEFT ARM  Final   Special Requests BOTTLES DRAWN AEROBIC AND ANAEROBIC 12CC   Final   Culture NO GROWTH 2 DAYS   Final   Report Status PENDING   Incomplete  CLOSTRIDIUM DIFFICILE BY PCR     Status: None   Collection Time    10/31/13 11:05 AM      Result Value Ref Range Status   C difficile by pcr NEGATIVE  NEGATIVE Final     Labs: Basic Metabolic Panel:  Recent Labs Lab 10/30/13 1612 10/30/13 2220 10/31/13 0552 11/01/13 0602  NA 136* 134* 139 140  K 2.6* 2.7* 3.1* 3.2*  CL 85* 87* 91* 96  CO2 40* 39* 39* 36*  GLUCOSE 100* 82 101* 82  BUN <3* <3* <3* 3*  CREATININE 0.48* 0.46* 0.53 0.49*  CALCIUM 7.4* 6.8* 7.2* 7.1*  MG  --  1.1*  --   --    Liver Function Tests:  Recent Labs Lab 10/30/13 1612 10/31/13 0552  AST 61* 57*  ALT 14 12  ALKPHOS 278* 251*  BILITOT 1.5* 2.8*  PROT 7.3 6.8  ALBUMIN 2.2* 2.0*    Recent Labs Lab 10/30/13 1612  LIPASE 6*    Recent Labs Lab 10/30/13 1724  AMMONIA 22   CBC:  Recent Labs Lab 10/30/13 1612 10/31/13 0552 11/01/13 0603  WBC 5.9 5.3 4.9  NEUTROABS 4.3  --   --   HGB 11.7* 11.1* 9.7*  HCT 34.2* 32.7* 29.0*  MCV 106.2* 108.6* 109.4*  PLT 137* 111* 119*   Cardiac Enzymes: No results found for this basename: CKTOTAL, CKMB, CKMBINDEX, TROPONINI,  in the last 168 hours BNP: BNP (last 3 results)  Recent Labs  04/22/13 1047 10/30/13 1612  PROBNP 254.9* 111.9   CBG: No results found for this basename: GLUCAP,  in the last 168 hours     Signed:  Fronia Depass  [facility] 11/01/2013, 8:57 PM

## 2013-11-01 NOTE — Evaluation (Signed)
Occupational Therapy Evaluation Patient Details Name: YANETH FAIRBAIRN MRN: [id number] DOB: Aug 28, 1960 Today's Date: 11/01/2013    History of Present Illness ERIENNE SPELMAN is a 53 y.o. 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 It is located in the upper part of the abdomen and radiates down to the lower abdomen. It's a sharp pain, 10 out of 10 in intensity. There are no precipitating, aggravating or relieving factors. And, then over this weekend she developed nausea, and has had numerous episodes of vomiting. Denies any blood in the emesis. She's also had loose, watery stools, about 5-6 episodes. Denies any blood in the stool either. She thinks she may have had a low-grade fever over this weekend. Denies any discomfort with urination. She is very anxious. She's had generalized weakness, which has been worse over the last 2-3 days. Review of her records suggest that this left-sided abdominal pain is a chronic issue for her. She had similar pain 3-4 months ago. She has had upper endoscopy and colonoscopy in March. Colonoscopy showed diverticulosis, hemorrhoids. Upper endoscopy showed improved is a fasciitis, healed gastric ulcer and hiatal hernia. It was felt that her pain was related to ascites. She has undergone paracentesis. She underwent a diagnostic tap a few days ago.   Clinical Impression   Pt is presenting to acute OT with above situation.  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  Pt reports current functioning being baseline since onset of disease, and that she does not currently have increased weakness from n/v.  Nausea has improved this AM from yesterday.  Pt agreed to minimal OOB activities this AM - pt  able to stand with min guard and hand-held assist.  Pt reports being as baseline with ADL needs.  Recommend HHOT services for continued strengthening.    Follow Up Recommendations  Home health OT (pt reports being discharged from [facility] services, and has been given referral for outpatient therapies (for [place]), but has had difficulty attending due to transportation issues. Providing HHOT referral if pt feels she is weaker than anticipated after hospital stay, or if transportation to outpatient therapies continues to be a challenge.)    Equipment Recommendations  None recommended by OT    Recommendations for Other Services       Precautions / Restrictions Precautions Precaution Comments: Guillain Barre Restrictions Weight Bearing Restrictions: No      Mobility Bed Mobility Overal bed mobility: Modified Independent Bed Mobility: Supine to Sit;Sit to Supine     Supine to sit: Modified independent (Device/Increase time) Sit to supine: Modified independent (Device/Increase time)      Transfers Overall transfer level: Needs assistance Equipment used: 1 person hand held assist Transfers: Sit to/from Stand Sit to Stand: Min guard              Balance Overall balance assessment: Needs assistance Sitting-balance support: No upper extremity supported;Feet supported Sitting balance-Leahy Scale: Good     Standing balance support: Single extremity supported Standing balance-Leahy Scale: Fair                              ADL Overall ADL's : At baseline  General ADL Comments: Pt reports being at baseline functioning with ADL needs (has requried assist since onset of Buillina Barre in fall 2014). Max assist with IADL needs, and set-up to mod assist for ADLs, depending on the time of time.     Vision                     Perception     Praxis      Pertinent Vitals/Pain      Hand Dominance  Right   Extremity/Trunk Assessment Upper Extremity Assessment Upper Extremity Assessment: Generalized weakness   Lower Extremity Assessment Lower Extremity Assessment: Defer to PT evaluation       Communication Communication Communication: No difficulties   Cognition Arousal/Alertness: Awake/alert Behavior During Therapy: WFL for tasks assessed/performed Overall Cognitive Status: Within Functional Limits for tasks assessed                     General Comments       Exercises       Shoulder Instructions      Home Living Family/patient expects to be discharged to:: Private residence Living Arrangements: Spouse/significant other Available Help at Discharge: Family;Available PRN/intermittently (Husband works during the day. Friend assists every other day) Type of Home: House       Home Layout: One level     Bathroom Shower/Tub: Tub/shower unit Shower/tub characteristics: Door       Home Equipment: Wheelchair - manual;Bedside commode;Tub bench;Walker - 4 wheels;Grab bars - tub/shower;Grab bars - toilet;Hospital bed          Prior Functioning/Environment Level of Independence: Needs assistance  Gait / Transfers Assistance Needed: Pt reports due to stiffness in the morning with Jossie Ng she requires assist from husband to get in her W/C, though after the stiffness is gone is is able to complete transfers and household amb with use of rollator.   ADL's / Homemaking Assistance Needed: hsuband assists with transfer out of shower and with washing back.  pt reports being able to dress herself, but receiving some assist in the mornings (due to increased stiffness). Has assist from neighbro and husband for cooking (pt is able to use microwave when neded), laundry, dishes, and bills.        OT Diagnosis: Generalized weakness   OT Problem List: Decreased strength;Decreased range of motion;Decreased activity tolerance;Decreased coordination   OT  Treatment/Interventions: Self-care/ADL training;Therapeutic exercise;Energy conservation;Patient/family education;DME and/or AE instruction;Therapeutic activities    OT Goals(Current goals can be found in the care plan section) Acute Rehab OT Goals Patient Stated Goal: decrease pain  OT Goal Formulation: With patient Time For Goal Achievement: 11/15/13 Potential to Achieve Goals: Good ADL Goals Pt Will Transfer to Toilet: with modified independence Pt/caregiver will Perform Home Exercise Program: Increased ROM;Increased strength;Both right and left upper extremity  OT Frequency: Min 2X/week   Barriers to D/C:            Co-evaluation              End of Session Equipment Utilized During Treatment: Gait belt  Activity Tolerance: Patient tolerated treatment well Patient left: in bed;with call bell/phone within reach;with bed alarm set   Time: 8563-1497 OT Time Calculation (min): 25 min Charges:  OT General Charges $OT Visit: 1 Procedure OT Evaluation $Initial OT Evaluation Tier I: 1 Procedure G-Codes:     Bea Graff, MS, OTR/L [phone]  11/01/2013, 9:49 AM

## 2013-11-02 NOTE — Telephone Encounter (Signed)
Patient called and wanted to know why she was discharged from the practice.  I explained to the patient that due to her multiple no shows the doctor patient relationship was no longer productive and she can call her pcp and have them refer her to another GI md.  She voiced understanding and disconnected the call.

## 2013-11-04 LAB — CULTURE, BLOOD (ROUTINE X 2)
CULTURE: NO GROWTH
CULTURE: NO GROWTH

## 2013-12-14 ENCOUNTER — Other Ambulatory Visit: Payer: Self-pay | Admitting: Gastroenterology

## 2014-01-06 ENCOUNTER — Inpatient Hospital Stay (HOSPITAL_COMMUNITY)
Admission: EM | Admit: 2014-01-06 | Discharge: 2014-01-14 | DRG: 420 | Disposition: A | Discharge status: Home Health | Payer: Medicare Other | Attending: Internal Medicine | Admitting: Internal Medicine

## 2014-01-06 ENCOUNTER — Inpatient Hospital Stay (HOSPITAL_COMMUNITY): Payer: Medicare Other

## 2014-01-06 ENCOUNTER — Emergency Department (HOSPITAL_COMMUNITY): Payer: Medicare Other

## 2014-01-06 ENCOUNTER — Encounter (HOSPITAL_COMMUNITY): Payer: Self-pay | Admitting: Emergency Medicine

## 2014-01-06 DIAGNOSIS — E039 Hypothyroidism, unspecified: Secondary | ICD-10-CM | POA: Diagnosis present

## 2014-01-06 DIAGNOSIS — E860 Dehydration: Secondary | ICD-10-CM | POA: Diagnosis present

## 2014-01-06 DIAGNOSIS — E8770 Fluid overload, unspecified: Secondary | ICD-10-CM | POA: Diagnosis present

## 2014-01-06 DIAGNOSIS — F411 Generalized anxiety disorder: Secondary | ICD-10-CM

## 2014-01-06 DIAGNOSIS — I959 Hypotension, unspecified: Secondary | ICD-10-CM | POA: Diagnosis present

## 2014-01-06 DIAGNOSIS — F17200 Nicotine dependence, unspecified, uncomplicated: Secondary | ICD-10-CM

## 2014-01-06 DIAGNOSIS — E871 Hypo-osmolality and hyponatremia: Secondary | ICD-10-CM | POA: Diagnosis present

## 2014-01-06 DIAGNOSIS — F1010 Alcohol abuse, uncomplicated: Secondary | ICD-10-CM | POA: Diagnosis present

## 2014-01-06 DIAGNOSIS — D638 Anemia in other chronic diseases classified elsewhere: Secondary | ICD-10-CM | POA: Diagnosis present

## 2014-01-06 DIAGNOSIS — K625 Hemorrhage of anus and rectum: Secondary | ICD-10-CM | POA: Diagnosis present

## 2014-01-06 DIAGNOSIS — F419 Anxiety disorder, unspecified: Secondary | ICD-10-CM

## 2014-01-06 DIAGNOSIS — R748 Abnormal levels of other serum enzymes: Secondary | ICD-10-CM

## 2014-01-06 DIAGNOSIS — K760 Fatty (change of) liver, not elsewhere classified: Secondary | ICD-10-CM

## 2014-01-06 DIAGNOSIS — Z720 Tobacco use: Secondary | ICD-10-CM

## 2014-01-06 DIAGNOSIS — N179 Acute kidney failure, unspecified: Secondary | ICD-10-CM | POA: Diagnosis present

## 2014-01-06 DIAGNOSIS — G9340 Encephalopathy, unspecified: Secondary | ICD-10-CM | POA: Diagnosis present

## 2014-01-06 DIAGNOSIS — D649 Anemia, unspecified: Secondary | ICD-10-CM

## 2014-01-06 DIAGNOSIS — N390 Urinary tract infection, site not specified: Secondary | ICD-10-CM | POA: Diagnosis present

## 2014-01-06 DIAGNOSIS — F17210 Nicotine dependence, cigarettes, uncomplicated: Secondary | ICD-10-CM | POA: Diagnosis present

## 2014-01-06 DIAGNOSIS — E8809 Other disorders of plasma-protein metabolism, not elsewhere classified: Secondary | ICD-10-CM | POA: Diagnosis present

## 2014-01-06 DIAGNOSIS — I509 Heart failure, unspecified: Secondary | ICD-10-CM

## 2014-01-06 DIAGNOSIS — G8929 Other chronic pain: Secondary | ICD-10-CM | POA: Diagnosis present

## 2014-01-06 DIAGNOSIS — J449 Chronic obstructive pulmonary disease, unspecified: Secondary | ICD-10-CM | POA: Diagnosis present

## 2014-01-06 DIAGNOSIS — R601 Generalized edema: Secondary | ICD-10-CM | POA: Diagnosis present

## 2014-01-06 DIAGNOSIS — B1920 Unspecified viral hepatitis C without hepatic coma: Secondary | ICD-10-CM | POA: Diagnosis present

## 2014-01-06 DIAGNOSIS — R7989 Other specified abnormal findings of blood chemistry: Secondary | ICD-10-CM

## 2014-01-06 DIAGNOSIS — Z8711 Personal history of peptic ulcer disease: Secondary | ICD-10-CM

## 2014-01-06 DIAGNOSIS — Z9119 Patient's noncompliance with other medical treatment and regimen: Secondary | ICD-10-CM | POA: Diagnosis present

## 2014-01-06 DIAGNOSIS — I5032 Chronic diastolic (congestive) heart failure: Secondary | ICD-10-CM

## 2014-01-06 DIAGNOSIS — K769 Liver disease, unspecified: Secondary | ICD-10-CM | POA: Diagnosis present

## 2014-01-06 DIAGNOSIS — M1990 Unspecified osteoarthritis, unspecified site: Secondary | ICD-10-CM | POA: Diagnosis present

## 2014-01-06 DIAGNOSIS — D539 Nutritional anemia, unspecified: Secondary | ICD-10-CM | POA: Diagnosis present

## 2014-01-06 DIAGNOSIS — K649 Unspecified hemorrhoids: Secondary | ICD-10-CM | POA: Diagnosis present

## 2014-01-06 DIAGNOSIS — R188 Other ascites: Secondary | ICD-10-CM

## 2014-01-06 DIAGNOSIS — E875 Hyperkalemia: Secondary | ICD-10-CM | POA: Diagnosis present

## 2014-01-06 DIAGNOSIS — K7031 Alcoholic cirrhosis of liver with ascites: Secondary | ICD-10-CM | POA: Diagnosis present

## 2014-01-06 DIAGNOSIS — D62 Acute posthemorrhagic anemia: Secondary | ICD-10-CM

## 2014-01-06 DIAGNOSIS — R109 Unspecified abdominal pain: Secondary | ICD-10-CM | POA: Diagnosis present

## 2014-01-06 DIAGNOSIS — K7030 Alcoholic cirrhosis of liver without ascites: Secondary | ICD-10-CM

## 2014-01-06 DIAGNOSIS — R945 Abnormal results of liver function studies: Secondary | ICD-10-CM

## 2014-01-06 DIAGNOSIS — H540 Blindness, both eyes: Secondary | ICD-10-CM | POA: Diagnosis present

## 2014-01-06 DIAGNOSIS — E669 Obesity, unspecified: Secondary | ICD-10-CM

## 2014-01-06 DIAGNOSIS — G610 Guillain-Barre syndrome: Secondary | ICD-10-CM

## 2014-01-06 HISTORY — DX: Alcohol abuse, in remission: F10.11

## 2014-01-06 HISTORY — DX: Other chronic pain: G89.29

## 2014-01-06 HISTORY — DX: Other hypotension: I95.89

## 2014-01-06 HISTORY — DX: Unspecified abdominal pain: R10.9

## 2014-01-06 HISTORY — DX: Malingerer (conscious simulation): Z76.5

## 2014-01-06 LAB — COMPREHENSIVE METABOLIC PANEL
ALT: 13 U/L (ref 0–35)
ANION GAP: 21 — AB (ref 5–15)
AST: 31 U/L (ref 0–37)
Albumin: 2.3 g/dL — ABNORMAL LOW (ref 3.5–5.2)
Alkaline Phosphatase: 213 U/L — ABNORMAL HIGH (ref 39–117)
BUN: 39 mg/dL — ABNORMAL HIGH (ref 6–23)
CALCIUM: 8.6 mg/dL (ref 8.4–10.5)
CO2: 18 mEq/L — ABNORMAL LOW (ref 19–32)
Chloride: 87 mEq/L — ABNORMAL LOW (ref 96–112)
Creatinine, Ser: 2.47 mg/dL — ABNORMAL HIGH (ref 0.50–1.10)
GFR calc non Af Amer: 21 mL/min — ABNORMAL LOW (ref >90)
GFR, EST AFRICAN AMERICAN: 25 mL/min — AB (ref >90)
GLUCOSE: 87 mg/dL (ref 70–99)
Potassium: 4.7 mEq/L (ref 3.7–5.3)
Sodium: 126 mEq/L — ABNORMAL LOW (ref 137–147)
TOTAL PROTEIN: 7.5 g/dL (ref 6.0–8.3)
Total Bilirubin: 0.7 mg/dL (ref 0.3–1.2)

## 2014-01-06 LAB — CBC WITH DIFFERENTIAL/PLATELET
BASOS ABS: 0 10*3/uL (ref 0.0–0.1)
Basophils Relative: 0 % (ref 0–1)
EOS ABS: 0 10*3/uL (ref 0.0–0.7)
EOS PCT: 0 % (ref 0–5)
HCT: 26.3 % — ABNORMAL LOW (ref 36.0–46.0)
Hemoglobin: 9.3 g/dL — ABNORMAL LOW (ref 12.0–15.0)
Lymphocytes Relative: 10 % — ABNORMAL LOW (ref 12–46)
Lymphs Abs: 1.5 10*3/uL (ref 0.7–4.0)
MCH: 36.3 pg — AB (ref 26.0–34.0)
MCHC: 35.4 g/dL (ref 30.0–36.0)
MCV: 102.7 fL — AB (ref 78.0–100.0)
Monocytes Absolute: 1.3 10*3/uL — ABNORMAL HIGH (ref 0.1–1.0)
Monocytes Relative: 9 % (ref 3–12)
Neutro Abs: 12.1 10*3/uL — ABNORMAL HIGH (ref 1.7–7.7)
Neutrophils Relative %: 81 % — ABNORMAL HIGH (ref 43–77)
Platelets: 138 10*3/uL — ABNORMAL LOW (ref 150–400)
RBC: 2.56 MIL/uL — AB (ref 3.87–5.11)
RDW: 14.6 % (ref 11.5–15.5)
WBC: 15 10*3/uL — ABNORMAL HIGH (ref 4.0–10.5)

## 2014-01-06 LAB — BODY FLUID CELL COUNT WITH DIFFERENTIAL
EOS FL: 0 %
LYMPHS FL: 0 %
MONOCYTE-MACROPHAGE-SEROUS FLUID: 81 % (ref 50–90)
Neutrophil Count, Fluid: 19 % (ref 0–25)
Total Nucleated Cell Count, Fluid: 66 cu mm (ref 0–1000)

## 2014-01-06 LAB — URINE MICROSCOPIC-ADD ON

## 2014-01-06 LAB — URINALYSIS, ROUTINE W REFLEX MICROSCOPIC
Bilirubin Urine: NEGATIVE
Glucose, UA: NEGATIVE mg/dL
KETONES UR: NEGATIVE mg/dL
Nitrite: NEGATIVE
PROTEIN: NEGATIVE mg/dL
SPECIFIC GRAVITY, URINE: 1.015 (ref 1.005–1.030)
UROBILINOGEN UA: 0.2 mg/dL (ref 0.0–1.0)
pH: 5.5 (ref 5.0–8.0)

## 2014-01-06 LAB — PROTIME-INR
INR: 1.23 (ref 0.00–1.49)
PROTHROMBIN TIME: 15.5 s — AB (ref 11.6–15.2)

## 2014-01-06 LAB — LACTATE DEHYDROGENASE, PLEURAL OR PERITONEAL FLUID: LD FL: 30 U/L — AB (ref 3–23)

## 2014-01-06 LAB — LIPASE, BLOOD: Lipase: 20 U/L (ref 11–59)

## 2014-01-06 LAB — PROTEIN, BODY FLUID: TOTAL PROTEIN, FLUID: 0.6 g/dL

## 2014-01-06 LAB — GLUCOSE, PERITONEAL FLUID: Glucose, Peritoneal Fluid: 108 mg/dL

## 2014-01-06 LAB — ALBUMIN, FLUID (OTHER): Albumin, Fluid: 0.2 g/dL

## 2014-01-06 LAB — AMMONIA: Ammonia: 37 umol/L (ref 11–60)

## 2014-01-06 MED ORDER — POTASSIUM CHLORIDE CRYS ER 10 MEQ PO TBCR
10.0000 meq | EXTENDED_RELEASE_TABLET | Freq: Four times a day (QID) | ORAL | Status: DC
  Administered 2014-01-06 – 2014-01-11 (×22): 10 meq via ORAL
  Filled 2014-01-06 (×22): qty 1

## 2014-01-06 MED ORDER — SODIUM CHLORIDE 0.9 % IV SOLN
INTRAVENOUS | Status: DC
  Administered 2014-01-06: 75 mL/h via INTRAVENOUS
  Administered 2014-01-07 – 2014-01-08 (×2): via INTRAVENOUS

## 2014-01-06 MED ORDER — OXYCODONE HCL 5 MG PO TABS
5.0000 mg | ORAL_TABLET | ORAL | Status: DC | PRN
  Administered 2014-01-07 – 2014-01-14 (×29): 5 mg via ORAL
  Filled 2014-01-06 (×30): qty 1

## 2014-01-06 MED ORDER — LACTULOSE 10 GM/15ML PO SOLN
30.0000 g | Freq: Two times a day (BID) | ORAL | Status: DC
  Administered 2014-01-06 – 2014-01-11 (×9): 30 g via ORAL
  Filled 2014-01-06 (×10): qty 60

## 2014-01-06 MED ORDER — PREGABALIN 50 MG PO CAPS
50.0000 mg | ORAL_CAPSULE | Freq: Three times a day (TID) | ORAL | Status: DC
  Administered 2014-01-06 – 2014-01-14 (×22): 50 mg via ORAL
  Filled 2014-01-06 (×22): qty 1

## 2014-01-06 MED ORDER — TRAZODONE HCL 50 MG PO TABS
50.0000 mg | ORAL_TABLET | Freq: Every evening | ORAL | Status: DC | PRN
  Administered 2014-01-06 – 2014-01-13 (×7): 50 mg via ORAL
  Filled 2014-01-06 (×8): qty 1

## 2014-01-06 MED ORDER — SODIUM CHLORIDE 0.9 % IV SOLN
INTRAVENOUS | Status: DC
  Administered 2014-01-06 – 2014-01-07 (×2): via INTRAVENOUS

## 2014-01-06 MED ORDER — NICOTINE 14 MG/24HR TD PT24
14.0000 mg | MEDICATED_PATCH | Freq: Every day | TRANSDERMAL | Status: DC
  Administered 2014-01-06 – 2014-01-14 (×9): 14 mg via TRANSDERMAL
  Filled 2014-01-06 (×10): qty 1

## 2014-01-06 MED ORDER — LEVOTHYROXINE SODIUM 50 MCG PO TABS
50.0000 ug | ORAL_TABLET | Freq: Every day | ORAL | Status: DC
  Administered 2014-01-07 – 2014-01-14 (×8): 50 ug via ORAL
  Filled 2014-01-06 (×8): qty 1

## 2014-01-06 MED ORDER — SPIRONOLACTONE 25 MG PO TABS
100.0000 mg | ORAL_TABLET | Freq: Two times a day (BID) | ORAL | Status: DC
  Administered 2014-01-06 – 2014-01-12 (×12): 100 mg via ORAL
  Filled 2014-01-06 (×12): qty 4

## 2014-01-06 MED ORDER — HEPARIN SODIUM (PORCINE) 5000 UNIT/ML IJ SOLN
5000.0000 [IU] | Freq: Three times a day (TID) | INTRAMUSCULAR | Status: DC
  Administered 2014-01-06 – 2014-01-14 (×24): 5000 [IU] via SUBCUTANEOUS
  Filled 2014-01-06 (×24): qty 1

## 2014-01-06 MED ORDER — VITAMIN B-1 100 MG PO TABS
100.0000 mg | ORAL_TABLET | Freq: Every day | ORAL | Status: DC
  Administered 2014-01-07 – 2014-01-14 (×8): 100 mg via ORAL
  Filled 2014-01-06 (×8): qty 1

## 2014-01-06 MED ORDER — PANTOPRAZOLE SODIUM 40 MG PO TBEC
40.0000 mg | DELAYED_RELEASE_TABLET | Freq: Two times a day (BID) | ORAL | Status: DC
  Administered 2014-01-07 – 2014-01-14 (×15): 40 mg via ORAL
  Filled 2014-01-06 (×15): qty 1

## 2014-01-06 MED ORDER — SODIUM CHLORIDE 0.9 % IV BOLUS (SEPSIS): 250.0000 mL | Freq: Once | INTRAVENOUS | Status: DC

## 2014-01-06 MED ORDER — FUROSEMIDE 40 MG PO TABS
40.0000 mg | ORAL_TABLET | Freq: Every day | ORAL | Status: DC
  Administered 2014-01-07: 40 mg via ORAL
  Filled 2014-01-06: qty 1

## 2014-01-06 MED ORDER — HYDROMORPHONE HCL 1 MG/ML IJ SOLN
0.5000 mg | INTRAMUSCULAR | Status: DC | PRN
  Administered 2014-01-06 – 2014-01-07 (×3): 0.5 mg via INTRAVENOUS
  Filled 2014-01-06 (×3): qty 1

## 2014-01-06 MED ORDER — SODIUM CHLORIDE 0.9 % IV SOLN: INTRAVENOUS | Status: AC

## 2014-01-06 MED ORDER — ONDANSETRON HCL 4 MG/2ML IJ SOLN
4.0000 mg | Freq: Three times a day (TID) | INTRAMUSCULAR | Status: AC | PRN
  Administered 2014-01-07: 4 mg via INTRAVENOUS
  Filled 2014-01-06: qty 2

## 2014-01-06 NOTE — ED Notes (Signed)
Consent signed,  Patient placed on continuous cardiac monitoring, continuous 02 monitoring

## 2014-01-06 NOTE — ED Notes (Addendum)
60 Ml pull from Paracentesis to send to lab for testing, Total 460 L removed by vacuum glass bottle, Pt has complaints, states she is feeling better. Abdomen distended, but not as tight

## 2014-01-06 NOTE — ED Provider Notes (Signed)
CSN: [id number]     Arrival date & time 01/06/14  1335 History   First MD Initiated Contact with Patient 01/06/14 1433     Chief Complaint  Patient presents with  . Abdominal Pain      HPI Pt was seen at 1445. Per pt, c/o gradual onset and worsening of persistent "abd distention" for the past 1 week. Pt states she was admitted to [facility] on 01/04/14 for same, but left AMA yesterday "because they weren't doing anything and they wouldn't give me my pain medicine." Pt states she was told by [facility] that "my electrolytes were abnormal." Pt states she "needs a paracentesis" and "they didn't do one." Denies abd pain, no N/V/D, no back pain, no CP/SOB, no fevers, no rash.    GI: Dr. Britta Mccreedy in [place] Past Medical History  Diagnosis Date  . Guillain-Barre   . Blind   . Neuropathy   . Thyroid disease   . Back pain   . Arthritis   . Seizures   . UTI (lower urinary tract infection)   . Anxiety   . Depression   . DDD (degenerative disc disease), lumbar   . Hepatitis C antibody test positive     HCV RNA is negative 03/2013  . Mild diastolic dysfunction 2/77/8242  . Esophagitis, erosive 03/2014  . Hepatic steatosis 04/23/2013  . Hypomagnesemia 04/23/2013  . Cirrhosis of liver without mention of alcohol     ct on 06/06/13= Subtle nodular contour of the liver. Cirrhosis cannot be excluded.   . PUD (peptic ulcer disease)   . Tubular adenoma   . Chronic abdominal pain   . Drug-seeking behavior     per [facility] admission records 01/04/14  . Chronic hypotension   . History of ETOH abuse    Past Surgical History  Procedure Laterality Date  . Cholecystectomy    . Tubal ligation    . Femur fracture surgery    . Ankle surgery    . Back surgery    . Esophagogastroduodenoscopy N/A 03/21/2013    Dr. Gala Romney: severe erosive reflux esophagitis, hiatal hernia, gastric ulcer with element of partial gastric outlet obstruction secondary to ulcer effect. Likely r/t NSAIDs. negative  H.pylori  . Colonoscopy with esophagogastroduodenoscopy (egd) N/A 06/24/2013    RMR: Erosive reflux esophagitis-much improved over that seen previously. Hiatal hernia. Previously noted gastric ulcer completely healed.  Portal gastropathy/TCS:Rectal varices/anal canal hemorrhoids. Portal colopathy.Colonic diverticulosis. Colonic polyp-removed as above   Family History  Problem Relation Age of Onset  . Colon cancer Neg Hx   . Kidney disease Mother    History  Substance Use Topics  . Smoking status: Current Every Day Smoker -- 0.50 packs/day    Types: Cigarettes  . Smokeless tobacco: Not on file  . Alcohol Use: No     Comment: hx of ETOH abuse, none in 6 months    Review of Systems ROS: Statement: All systems negative except as marked or noted in the HPI; Constitutional: Negative for fever and chills. ; ; Eyes: Negative for eye pain, redness and discharge. ; ; ENMT: Negative for ear pain, hoarseness, nasal congestion, sinus pressure and sore throat. ; ; Cardiovascular: Negative for chest pain, palpitations, diaphoresis, dyspnea and peripheral edema. ; ; Respiratory: Negative for cough, wheezing and stridor. ; ; Gastrointestinal: +abd distention. Negative for nausea, vomiting, diarrhea, abdominal pain, blood in stool, hematemesis, jaundice and rectal bleeding. . ; ; Genitourinary: Negative for dysuria, flank pain and hematuria. ; ; Musculoskeletal: Negative  for back pain and neck pain. Negative for swelling and trauma.; ; Skin: Negative for pruritus, rash, abrasions, blisters, bruising and skin lesion.; ; Neuro: Negative for headache, lightheadedness and neck stiffness. Negative for weakness, altered level of consciousness , altered mental status, extremity weakness, paresthesias, involuntary movement, seizure and syncope.      Allergies  Neurontin; Acetaminophen; Aspirin; Eggs or egg-derived products; Tramadol; and Codeine  Home Medications   Prior to Admission medications   Medication Sig  Start Date End Date Taking? Authorizing Provider  cyanocobalamin (,VITAMIN B-12,) 1000 MCG/ML injection Inject 1,000 mcg into the muscle every 30 (thirty) days. Sunday   Yes Historical Provider, MD  furosemide (LASIX) 80 MG tablet Take 80 mg by mouth daily.   Yes Historical Provider, MD  ipratropium-albuterol (DUONEB) 0.5-2.5 (3) MG/3ML SOLN Take 3 mLs by nebulization 4 (four) times daily.   Yes Historical Provider, MD  lactulose (CHRONULAC) 10 GM/15ML solution TAKE TWO TABLESPOONSFUL (30 ML) BY MOUTH TWICE DAILY 12/15/13  Yes Orvil Feil, NP  levothyroxine (SYNTHROID, LEVOTHROID) 50 MCG tablet Take 50 mcg by mouth daily before breakfast.   Yes Historical Provider, MD  morphine (MSIR) 15 MG tablet Take 15 mg by mouth 4 (four) times daily.   Yes Historical Provider, MD  pantoprazole (PROTONIX) 40 MG tablet Take 1 tablet (40 mg total) by mouth 2 (two) times daily before a meal. 03/26/13  Yes Samuella Cota, MD  potassium chloride (K-DUR,KLOR-CON) 10 MEQ tablet Take 10 mEq by mouth 4 (four) times daily.   Yes Historical Provider, MD  pregabalin (LYRICA) 50 MG capsule Take 50 mg by mouth 3 (three) times daily.   Yes Historical Provider, MD  sennosides-docusate sodium (SENOKOT-S) 8.6-50 MG tablet Take 2 tablets by mouth daily.   Yes Historical Provider, MD  spironolactone (ALDACTONE) 100 MG tablet Take 1 tablet (100 mg total) by mouth 2 (two) times daily. 06/14/13  Yes Nita Sells, MD  thiamine 100 MG tablet Take 100 mg by mouth daily.   Yes Historical Provider, MD   BP 91/75  Pulse 94  Temp(Src) 97.7 F (36.5 C) (Oral)  Resp 24  Ht 5\' 4"  (1.626 m)  Wt 175 lb (79.379 kg)  BMI 30.02 kg/m2  SpO2 100% Physical Exam 1450: Physical examination:  Nursing notes reviewed; Vital signs and O2 SAT reviewed;  Constitutional: Well developed, Well nourished, In no acute distress; Head:  Normocephalic, atraumatic; Eyes: EOMI, PERRL, No scleral icterus; ENMT: Mouth and pharynx normal, Mucous membranes  dry; Neck: Supple, Full range of motion, No lymphadenopathy; Cardiovascular: Regular rate and rhythm, No gallop; Respiratory: Breath sounds clear & equal bilaterally, No wheezes.  Speaking full sentences with ease, Normal respiratory effort/excursion; Chest: Nontender, Movement normal; Abdomen: Soft, Nontender, +distended, Normal bowel sounds; Genitourinary: No CVA tenderness; Extremities: Pulses normal, No tenderness, No edema, No calf edema or asymmetry.; Neuro: AA&Ox3, Major CN grossly intact.  Speech clear. No gross focal motor or sensory deficits in extremities.; Skin: Color normal, Warm, Dry.   ED Course  Procedures     MDM  MDM Reviewed: previous chart, nursing note and vitals Reviewed previous: labs Interpretation: labs, x-ray and ultrasound Total time providing critical care: 30-74 minutes. This excludes time spent performing separately reportable procedures and services. Consults: admitting MD   CRITICAL CARE Performed by: Alfonzo Feller Total critical care time: 35 Critical care time was exclusive of separately billable procedures and treating other patients. Critical care was necessary to treat or prevent imminent or life-threatening deterioration. Critical care was  time spent personally by me on the following activities: development of treatment plan with patient and/or surrogate as well as nursing, discussions with consultants, evaluation of patient's response to treatment, examination of patient, obtaining history from patient or surrogate, ordering and performing treatments and interventions, ordering and review of laboratory studies, ordering and review of radiographic studies, pulse oximetry and re-evaluation of patient's condition.  Results for orders placed during the hospital encounter of 01/06/14  CBC WITH DIFFERENTIAL      Result Value Ref Range   WBC 15.0 (*) 4.0 - 10.5 K/uL   RBC 2.56 (*) 3.87 - 5.11 MIL/uL   Hemoglobin 9.3 (*) 12.0 - 15.0 g/dL   HCT 26.3  (*) 36.0 - 46.0 %   MCV 102.7 (*) 78.0 - 100.0 fL   MCH 36.3 (*) 26.0 - 34.0 pg   MCHC 35.4  30.0 - 36.0 g/dL   RDW 14.6  11.5 - 15.5 %   Platelets 138 (*) 150 - 400 K/uL   Neutrophils Relative % 81 (*) 43 - 77 %   Neutro Abs 12.1 (*) 1.7 - 7.7 K/uL   Lymphocytes Relative 10 (*) 12 - 46 %   Lymphs Abs 1.5  0.7 - 4.0 K/uL   Monocytes Relative 9  3 - 12 %   Monocytes Absolute 1.3 (*) 0.1 - 1.0 K/uL   Eosinophils Relative 0  0 - 5 %   Eosinophils Absolute 0.0  0.0 - 0.7 K/uL   Basophils Relative 0  0 - 1 %   Basophils Absolute 0.0  0.0 - 0.1 K/uL  COMPREHENSIVE METABOLIC PANEL      Result Value Ref Range   Sodium 126 (*) 137 - 147 mEq/L   Potassium 4.7  3.7 - 5.3 mEq/L   Chloride 87 (*) 96 - 112 mEq/L   CO2 18 (*) 19 - 32 mEq/L   Glucose, Bld 87  70 - 99 mg/dL   BUN 39 (*) 6 - 23 mg/dL   Creatinine, Ser 2.47 (*) 0.50 - 1.10 mg/dL   Calcium 8.6  8.4 - 10.5 mg/dL   Total Protein 7.5  6.0 - 8.3 g/dL   Albumin 2.3 (*) 3.5 - 5.2 g/dL   AST 31  0 - 37 U/L   ALT 13  0 - 35 U/L   Alkaline Phosphatase 213 (*) 39 - 117 U/L   Total Bilirubin 0.7  0.3 - 1.2 mg/dL   GFR calc non Af Amer 21 (*) >90 mL/min   GFR calc Af Amer 25 (*) >90 mL/min   Anion gap 21 (*) 5 - 15  LIPASE, BLOOD      Result Value Ref Range   Lipase 20  11 - 59 U/L  AMMONIA      Result Value Ref Range   Ammonia 37  11 - 60 umol/L  PROTIME-INR      Result Value Ref Range   Prothrombin Time 15.5 (*) 11.6 - 15.2 seconds   INR 1.23  0.00 - 1.49   US Abdomen Limited 01/06/2014   CLINICAL DATA:  Distended abdomen.  EXAM: LIMITED ABDOMEN ULTRASOUND FOR ASCITES  TECHNIQUE: Limited ultrasound survey for ascites was performed in all four abdominal quadrants.  COMPARISON:  12/22/2013.  FINDINGS: Moderate, uncomplicated ascites distends the abdomen.  IMPRESSION: Moderate ascites.   Electronically Signed   By: Lajean Manes M.D.   On: 01/06/2014 15:38   Dg Abd Acute W/chest 01/06/2014   CLINICAL DATA:  Abdominal pain and  distension, shortness of  breath, history cirrhosis, hepatic steatosis, Guillain-Barre, hepatitis-C  EXAM: ACUTE ABDOMEN SERIES (ABDOMEN 2 VIEW & CHEST 1 VIEW)  COMPARISON:  Chest radiograph 10/30/2013, abdominal radiographs 06/10/2013  FINDINGS: Upper normal heart size.  Normal mediastinal contours and pulmonary vascularity.  Lungs clear.  No pleural effusion or pneumothorax.  Surgical clips RIGHT upper quadrant.  Increased attenuation of abdomen consistent with ascites.  Gaseous distention of transverse colon though this may be artifactually accentuated by significant abdominal distension / AP diameter of the abdomen.  No definite evidence of bowel obstruction or wall thickening.  No free intraperitoneal air.  IMPRESSION: No acute thoracic findings.  Ascites without evidence of bowel obstruction.   Electronically Signed   By: Lavonia Dana M.D.   On: 01/06/2014 16:07    Results for [name], [name] (MRN [id number]) as of 01/06/2014 16:22  Ref. Range 10/30/2013 17:24 10/30/2013 22:20 10/31/2013 05:52 11/01/2013 06:02 01/06/2014 14:13  Sodium Latest Range: 137-147 mEq/L  134 (L) 139 140 126 (L)  BUN Latest Range: 6-23 mg/dL  <3 (L) <3 (L) 3 (L) 39 (H)  Creatinine Latest Range: 0.50-1.10 mg/dL  0.46 (L) 0.53 0.49 (L) 2.47 (H)     1605:  EPIC chart reviewed: pt's VS is per her baseline. 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 The notes express concern regarding "drug seeking behavior," as pt was requesting "IV pain medications." Notes also state pt had her last paracentesis on 12/21/13. BUN/Cr elevated from baseline with new hyponatremia on today's labs.  H/H and platelets per baseline. Dx and testing d/w pt and family.  Questions answered.  Verb understanding, agreeable to admit.  T/C to [facility] Dr. Marily Memos, case discussed, including:  HPI, pertinent PM/SHx, VS/PE, dx testing, ED course and treatment:  Agreeable to admit, requests to write temporary orders, obtain  medical bed to team 1.   Francine Graven, DO 01/09/14 1702

## 2014-01-06 NOTE — ED Notes (Signed)
Abdominal distension. Pt also states she is not able to urinate ("only dribbling").  Missed appointment with Dr. Britta Mccreedy ([place]) to go to [facility] yesterday instead, and pt left AMA "Because they weren't giving me any of my daily medications." Pt states she was admitted due to electrolytes being abnormal. Pt states, "they would not give me pain medicine". Pt also states, "I need paracentesis immediately"

## 2014-01-06 NOTE — H&P (Addendum)
[facility] History and Physical  [name] IOX:[id number] DOB: 01/23/1961 DOA: 01/06/2014  Referring physician: Dr Thurnell Garbe PCP: Celedonio Savage, MD  Specialists: none  Chief Complaint: Abd pain  Assessment/Plan Active Problems:   Ascites Cirrhosis (alcohol induced) Hyponatremia Acute Kidney Failure COPD Guillan-barre Thyroid dysfunction  Cirrhosis: abdomen non-tender on exam. Low likelihood of SBP given neutrophil count of 19 on paracentesis tap. MELD score 17. Pt no longer responding to diuretics though not taking spironolactone. Ammonia 37. Paracentesis today w/ ~500cc out w/ some improvement - Admit to AP team 2 - Continue lasix 40mg  daily - Start spironolactone  - consider starting ABX if WBC trends up - f/u paracentesis culture  - Lactulose 30gm TID - Cont home Kdur  Acute Renal Failure: Cr 2.47 on admission. 2.3 at [facility]. Previously nml. Likely multifactorial from dehydration, increased diuretic use, hypotension, and possible hepatorenal syndrome.  - Renal consult (need to call in the am) - SPEP, UPEP - BMET in am - Bladder scan (if >300 w/ post void residual then i/o cath) - consider renal US   Hypotension: at baseline - monitor  Hyponatremia: Na 126 on admission. Improved from admission to [place]. Likely secondary to liver failure and ARF.  - Limit free water to <1,267mL per day - NS 44ml/hr - Correct slowly as likely chronic change.   Anemia: Hgb 9.3. baseline around 11. Likely from progressive/chronic disease. Macrocytic.  - Anemia panel  COPD: No wheezing or increased O2, but ronchi bilat in bases and SOB - CXR - cont chronic meds  Tobacco cessation: continues to smoke 1/2 ppd - Nicotine patch  Hypothyroid:  - cont synthroid  DVT Prophylaxis: Hep [place] TID (plts 138)  Code Status: FULL Family Communication: Pts boyfriend present for admission Disposition Plan: pending clinical improvement  HPI: [name] is a 53 y.o. female  came to [facility] ed 01/06/2014 with  abd pain. Worsened over past 7 days. Admitted to [facility] on 9/23. Left AMA on 9/24 as they were not" properly managing her pain adn not performing paracentesis. Last paracentesis 2 wks ago during another admission to [facility] (drew off 9L fluid at that time) PCP increased her lasix to 80mg  daily (up from 40) a couple weeks ago.  Significantly diminished urine output over the past week [place] increased lactulose to 30gm TID.  Endorses daily BM.  Denies fevers, CP, palpitations.  SOB from increasing abdominal girth.   Review of Systems: Per HPI w/ all other systems negative.   Past Medical History  Diagnosis Date  . Guillain-Barre   . Blind   . Neuropathy   . Thyroid disease   . Back pain   . Arthritis   . Seizures   . UTI (lower urinary tract infection)   . Anxiety   . Depression   . DDD (degenerative disc disease), lumbar   . Hepatitis C antibody test positive     HCV RNA is negative 03/2013  . Mild diastolic dysfunction 2/68/3419  . Esophagitis, erosive 03/2014  . Hepatic steatosis 04/23/2013  . Hypomagnesemia 04/23/2013  . Cirrhosis of liver without mention of alcohol     ct on 06/06/13= Subtle nodular contour of the liver. Cirrhosis cannot be excluded.   . PUD (peptic ulcer disease)   . Tubular adenoma   . Chronic abdominal pain   . Drug-seeking behavior     per [facility] admission records 01/04/14  . Chronic hypotension   . History of ETOH abuse    Past  Surgical History  Procedure Laterality Date  . Cholecystectomy    . Tubal ligation    . Femur fracture surgery    . Ankle surgery    . Back surgery    . Esophagogastroduodenoscopy N/A 03/21/2013    Dr. Gala Romney: severe erosive reflux esophagitis, hiatal hernia, gastric ulcer with element of partial gastric outlet obstruction secondary to ulcer effect. Likely r/t NSAIDs. negative H.pylori  . Colonoscopy with esophagogastroduodenoscopy (egd) N/A 06/24/2013    RMR: Erosive  reflux esophagitis-much improved over that seen previously. Hiatal hernia. Previously noted gastric ulcer completely healed.  Portal gastropathy/TCS:Rectal varices/anal canal hemorrhoids. Portal colopathy.Colonic diverticulosis. Colonic polyp-removed as above   Social History:  History   Social History Narrative  . No narrative on file    Allergies  Allergen Reactions  . Neurontin [Gabapentin] Anaphylaxis  . Acetaminophen Itching and Nausea And Vomiting  . Aspirin     Intolerance because of peptic ulcer  . Eggs Or Egg-Derived Products Nausea And Vomiting  . Tramadol     Nausea and vomiting  . Codeine Itching and Nausea And Vomiting    Patient is able to take morphine, demerol and fentanyl with out any type of side effects    Family History  Problem Relation Age of Onset  . Colon cancer Neg Hx   . Kidney disease Mother      Prior to Admission medications   Medication Sig Start Date End Date Taking? Authorizing Provider  cyanocobalamin (,VITAMIN B-12,) 1000 MCG/ML injection Inject 1,000 mcg into the muscle every 30 (thirty) days. Sunday   Yes Historical Provider, MD  furosemide (LASIX) 80 MG tablet Take 80 mg by mouth daily.   Yes Historical Provider, MD  ipratropium-albuterol (DUONEB) 0.5-2.5 (3) MG/3ML SOLN Take 3 mLs by nebulization 4 (four) times daily.   Yes Historical Provider, MD  lactulose (CHRONULAC) 10 GM/15ML solution TAKE TWO TABLESPOONSFUL (30 ML) BY MOUTH TWICE DAILY 12/15/13  Yes Orvil Feil, NP  levothyroxine (SYNTHROID, LEVOTHROID) 50 MCG tablet Take 50 mcg by mouth daily before breakfast.   Yes Historical Provider, MD  morphine (MSIR) 15 MG tablet Take 15 mg by mouth 4 (four) times daily.   Yes Historical Provider, MD  pantoprazole (PROTONIX) 40 MG tablet Take 1 tablet (40 mg total) by mouth 2 (two) times daily before a meal. 03/26/13  Yes Samuella Cota, MD  potassium chloride (K-DUR,KLOR-CON) 10 MEQ tablet Take 10 mEq by mouth 4 (four) times daily.   Yes  Historical Provider, MD  pregabalin (LYRICA) 50 MG capsule Take 50 mg by mouth 3 (three) times daily.   Yes Historical Provider, MD  sennosides-docusate sodium (SENOKOT-S) 8.6-50 MG tablet Take 2 tablets by mouth daily.   Yes Historical Provider, MD  spironolactone (ALDACTONE) 100 MG tablet Take 1 tablet (100 mg total) by mouth 2 (two) times daily. 06/14/13  Yes Nita Sells, MD  thiamine 100 MG tablet Take 100 mg by mouth daily.   Yes Historical Provider, MD   Physical Exam: Filed Vitals:   01/06/14 1340 01/06/14 1630 01/06/14 1700 01/06/14 1745  BP: 91/75 93/72 94/81  85/52  Pulse: 94 95  97  Temp: 97.7 F (36.5 C)   97.8 F (36.6 C)  TempSrc: Oral   Oral  Resp: 24 12 13 16   Height: 5\' 4"  (1.626 m)   5\' 4"  (1.626 m)  Weight: 79.379 kg (175 lb)   77.066 kg (169 lb 14.4 oz)  SpO2: 100% 97%  100%     General:  NAD  Eyes: EOMI, PERRL  ENT: mmm,  Neck: FROM  Cardiovascular: RRR, II/VI systolic murmur. Faint heart sounds  Respiratory: Bilat Ronchi w/ diminished breath sounds.   Abdomen: distended and tympanic. RUQ sterile bandage in place., diminished bowel sounds, non-ttp  Skin: icteric    Musculoskeletal: 1+ LE edema , moves all extremities spontaneously   Psychiatric: nml affect, anxious  Neurologic: CN2-12 Grossly intact, cerebellar fxn nml  Labs on Admission:  Basic Metabolic Panel:  Recent Labs Lab 01/06/14 1413  NA 126*  K 4.7  CL 87*  CO2 18*  GLUCOSE 87  BUN 39*  CREATININE 2.47*  CALCIUM 8.6   Liver Function Tests:  Recent Labs Lab 01/06/14 1413  AST 31  ALT 13  ALKPHOS 213*  BILITOT 0.7  PROT 7.5  ALBUMIN 2.3*    Recent Labs Lab 01/06/14 1413  LIPASE 20    Recent Labs Lab 01/06/14 1413  AMMONIA 37   CBC:  Recent Labs Lab 01/06/14 1413  WBC 15.0*  NEUTROABS 12.1*  HGB 9.3*  HCT 26.3*  MCV 102.7*  PLT 138*   Cardiac Enzymes: No results found for this basename: CKTOTAL, CKMB, CKMBINDEX, TROPONINI,  in the last 168  hours  BNP (last 3 results)  Recent Labs  04/22/13 1047 10/30/13 1612  PROBNP 254.9* 111.9   CBG: No results found for this basename: GLUCAP,  in the last 168 hours  Radiological Exams on Admission: US Abdomen Limited  01/06/2014   CLINICAL DATA:  Distended abdomen.  EXAM: LIMITED ABDOMEN ULTRASOUND FOR ASCITES  TECHNIQUE: Limited ultrasound survey for ascites was performed in all four abdominal quadrants.  COMPARISON:  12/22/2013.  FINDINGS: Moderate, uncomplicated ascites distends the abdomen.  IMPRESSION: Moderate ascites.   Electronically Signed   By: Lajean Manes M.D.   On: 01/06/2014 15:38   Dg Abd Acute W/chest  01/06/2014   CLINICAL DATA:  Abdominal pain and distension, shortness of breath, history cirrhosis, hepatic steatosis, Guillain-Barre, hepatitis-C  EXAM: ACUTE ABDOMEN SERIES (ABDOMEN 2 VIEW & CHEST 1 VIEW)  COMPARISON:  Chest radiograph 10/30/2013, abdominal radiographs 06/10/2013  FINDINGS: Upper normal heart size.  Normal mediastinal contours and pulmonary vascularity.  Lungs clear.  No pleural effusion or pneumothorax.  Surgical clips RIGHT upper quadrant.  Increased attenuation of abdomen consistent with ascites.  Gaseous distention of transverse colon though this may be artifactually accentuated by significant abdominal distension / AP diameter of the abdomen.  No definite evidence of bowel obstruction or wall thickening.  No free intraperitoneal air.  IMPRESSION: No acute thoracic findings.  Ascites without evidence of bowel obstruction.   Electronically Signed   By: Lavonia Dana M.D.   On: 01/06/2014 16:07   Time spent: > 70 min in direct pt care and coordination   MERRELL, DAVID J, MD [facility] [email or web address] Password [facility] 01/06/2014, 6:07 PM

## 2014-01-06 NOTE — Progress Notes (Signed)
Bladder scanned patient and the result was 460cc.  In and out cath per doctors order, output is 500cc clear yellow urine.  Will continue to monitor.

## 2014-01-07 DIAGNOSIS — D649 Anemia, unspecified: Secondary | ICD-10-CM

## 2014-01-07 DIAGNOSIS — R609 Edema, unspecified: Secondary | ICD-10-CM

## 2014-01-07 DIAGNOSIS — N179 Acute kidney failure, unspecified: Secondary | ICD-10-CM | POA: Diagnosis present

## 2014-01-07 DIAGNOSIS — E871 Hypo-osmolality and hyponatremia: Secondary | ICD-10-CM | POA: Diagnosis present

## 2014-01-07 DIAGNOSIS — E8809 Other disorders of plasma-protein metabolism, not elsewhere classified: Secondary | ICD-10-CM

## 2014-01-07 LAB — FOLATE: Folate: 11.8 ng/mL

## 2014-01-07 LAB — COMPREHENSIVE METABOLIC PANEL
ALBUMIN: 1.9 g/dL — AB (ref 3.5–5.2)
ALK PHOS: 180 U/L — AB (ref 39–117)
ALT: 11 U/L (ref 0–35)
AST: 30 U/L (ref 0–37)
Anion gap: 13 (ref 5–15)
BUN: 39 mg/dL — AB (ref 6–23)
CHLORIDE: 93 meq/L — AB (ref 96–112)
CO2: 21 mEq/L (ref 19–32)
Calcium: 8.1 mg/dL — ABNORMAL LOW (ref 8.4–10.5)
Creatinine, Ser: 2.23 mg/dL — ABNORMAL HIGH (ref 0.50–1.10)
GFR calc Af Amer: 28 mL/min — ABNORMAL LOW (ref >90)
GFR calc non Af Amer: 24 mL/min — ABNORMAL LOW (ref >90)
Glucose, Bld: 73 mg/dL (ref 70–99)
POTASSIUM: 4.9 meq/L (ref 3.7–5.3)
SODIUM: 127 meq/L — AB (ref 137–147)
Total Bilirubin: 0.6 mg/dL (ref 0.3–1.2)
Total Protein: 6.1 g/dL (ref 6.0–8.3)

## 2014-01-07 LAB — FERRITIN: Ferritin: 1004 ng/mL — ABNORMAL HIGH (ref 10–291)

## 2014-01-07 LAB — RETICULOCYTES
RBC.: 2.16 MIL/uL — AB (ref 3.87–5.11)
RETIC COUNT ABSOLUTE: 47.5 10*3/uL (ref 19.0–186.0)
Retic Ct Pct: 2.2 % (ref 0.4–3.1)

## 2014-01-07 LAB — CBC
HEMATOCRIT: 22 % — AB (ref 36.0–46.0)
HEMOGLOBIN: 7.9 g/dL — AB (ref 12.0–15.0)
MCH: 36.6 pg — AB (ref 26.0–34.0)
MCHC: 35.9 g/dL (ref 30.0–36.0)
MCV: 101.9 fL — ABNORMAL HIGH (ref 78.0–100.0)
Platelets: 116 10*3/uL — ABNORMAL LOW (ref 150–400)
RBC: 2.16 MIL/uL — ABNORMAL LOW (ref 3.87–5.11)
RDW: 14.6 % (ref 11.5–15.5)
WBC: 8.8 10*3/uL (ref 4.0–10.5)

## 2014-01-07 LAB — CREATININE, URINE, RANDOM: CREATININE, URINE: 18.15 mg/dL

## 2014-01-07 LAB — SODIUM, URINE, RANDOM: SODIUM UR: 60 meq/L

## 2014-01-07 LAB — VITAMIN B12: Vitamin B-12: 1456 pg/mL — ABNORMAL HIGH (ref 211–911)

## 2014-01-07 LAB — AMMONIA: Ammonia: 49 umol/L (ref 11–60)

## 2014-01-07 MED ORDER — ALBUMIN HUMAN 25 % IV SOLN
25.0000 g | Freq: Two times a day (BID) | INTRAVENOUS | Status: AC
  Administered 2014-01-07 – 2014-01-09 (×4): 25 g via INTRAVENOUS
  Filled 2014-01-07 (×4): qty 100

## 2014-01-07 MED ORDER — FUROSEMIDE 10 MG/ML IJ SOLN
60.0000 mg | Freq: Two times a day (BID) | INTRAMUSCULAR | Status: DC
  Administered 2014-01-07 – 2014-01-08 (×3): 60 mg via INTRAVENOUS
  Filled 2014-01-07 (×3): qty 6

## 2014-01-07 MED ORDER — HYDROMORPHONE HCL 1 MG/ML IJ SOLN
1.0000 mg | INTRAMUSCULAR | Status: DC | PRN
  Administered 2014-01-09 – 2014-01-12 (×13): 1 mg via INTRAVENOUS
  Filled 2014-01-07 (×14): qty 1

## 2014-01-07 MED ORDER — FUROSEMIDE 10 MG/ML IJ SOLN: 60.0000 mg | Freq: Two times a day (BID) | INTRAMUSCULAR | Status: DC

## 2014-01-07 MED ORDER — ONDANSETRON HCL 4 MG/2ML IJ SOLN
4.0000 mg | Freq: Four times a day (QID) | INTRAMUSCULAR | Status: DC | PRN
  Administered 2014-01-07 – 2014-01-14 (×6): 4 mg via INTRAVENOUS
  Filled 2014-01-07 (×5): qty 2

## 2014-01-07 MED ORDER — ALBUMIN HUMAN 25 % IV SOLN
INTRAVENOUS | Status: AC
  Filled 2014-01-07: qty 100

## 2014-01-07 NOTE — Progress Notes (Signed)
Late entry:  Patient's BP low (see flowsheet).  Dr. Roderic Palau notified.  Gave order to hold pain medications d/t hypotension and continue to monitor.  1845 - Patient requesting pain medication.  Dr. Roderic Palau notified and reviewed most recent BP.  Stated patient may receive her oral pain medication and continue to monitor blood pressure.  Oncoming night shift nurse notified.

## 2014-01-07 NOTE — Progress Notes (Signed)
[facility] PROGRESS NOTE  [name] KDX:[id number] DOB: [birth year] DOA: 01/06/2014 PCP: Celedonio Savage, MD  Assessment/Plan: 1. Acute renal failure.  Possibly related to intravascular volume depletion in the setting of higher dose diuretics. She has been started on IV hydration. Nephrology following. 2. Cirrhosis with ascites and anasarca.  Patient has hypoalbuminemia that is likely contributing to her anasarca and ascites. She will likely need a large volume paracentesis in the next 1-2 days.  She has been started on albumin infusions coupled with intravenous lasix. She is also on aldactone. 3. Hypotension.  Possibly intravascularly volume depleted.  Continue IVF. Check echo to assess LVEF 4. Hyponatremia. She does have evidence of volume overload. Would continue with lasix and observe. 5. Anemia, likely related to chronic disease with an acute component related to hemodilution. No evidence of bleeding. Will check in AM.  She may need a blood transfusion 6. Hypothyroidism.  Continue synthroid 7. Chronic pain.  Will have to be cautious with opiates at this time due to low blood pressures. 8. COPD appears stable at this time. No shortness of breath or wheezing. 9.   Code Status: full code Family Communication: discussed with patient Disposition Plan: discharge home once improved   Consultants:  Nephrology  Procedures:  Paracentesis in ED with removal of 460cc of fluid  Antibiotics:    HPI/Subjective: Complains of pain in her back and legs which is chronic.  Denies any shortness of breath.  She is tearful  Objective: Filed Vitals:   01/07/14 1708  BP: 92/60  Pulse:   Temp:   Resp:     Intake/Output Summary (Last 24 hours) at 01/07/14 1848 Last data filed at 01/07/14 1800  Gross per 24 hour  Intake 1381.67 ml  Output   1250 ml  Net 131.67 ml   Filed Weights   01/06/14 1340 01/06/14 1745 01/07/14 0500  Weight: 79.379 kg (175 lb) 77.066 kg (169 lb 14.4 oz)  84.097 kg (185 lb 6.4 oz)    Exam:   General:  NAD  Cardiovascular: s1, s2, rrr  Respiratory: crackles at bases  Abdomen: soft, distended, bs+  Musculoskeletal: 1+ edema b/l   Data Reviewed: Basic Metabolic Panel:  Recent Labs Lab 01/06/14 1413 01/07/14 0622  NA 126* 127*  K 4.7 4.9  CL 87* 93*  CO2 18* 21  GLUCOSE 87 73  BUN 39* 39*  CREATININE 2.47* 2.23*  CALCIUM 8.6 8.1*   Liver Function Tests:  Recent Labs Lab 01/06/14 1413 01/07/14 0622  AST 31 30  ALT 13 11  ALKPHOS 213* 180*  BILITOT 0.7 0.6  PROT 7.5 6.1  ALBUMIN 2.3* 1.9*    Recent Labs Lab 01/06/14 1413  LIPASE 20    Recent Labs Lab 01/06/14 1413 01/07/14 1736  AMMONIA 37 49   CBC:  Recent Labs Lab 01/06/14 1413 01/07/14 0622  WBC 15.0* 8.8  NEUTROABS 12.1*  --   HGB 9.3* 7.9*  HCT 26.3* 22.0*  MCV 102.7* 101.9*  PLT 138* 116*   Cardiac Enzymes: No results found for this basename: CKTOTAL, CKMB, CKMBINDEX, TROPONINI,  in the last 168 hours BNP (last 3 results)  Recent Labs  04/22/13 1047 10/30/13 1612  PROBNP 254.9* 111.9   CBG: No results found for this basename: GLUCAP,  in the last 168 hours  Recent Results (from the past 240 hour(s))  BODY FLUID CULTURE     Status: None   Collection Time    01/06/14  4:28 PM  Result Value Ref Range Status   Specimen Description ASCITIC FLUID   Final   Special Requests NONE   Final   Gram Stain     Final   Value: NO WBC SEEN     NO ORGANISMS SEEN     Performed at [facility]   Culture PENDING   Incomplete   Report Status PENDING   Incomplete     Studies: US Abdomen Limited  01/06/2014   CLINICAL DATA:  Distended abdomen.  EXAM: LIMITED ABDOMEN ULTRASOUND FOR ASCITES  TECHNIQUE: Limited ultrasound survey for ascites was performed in all four abdominal quadrants.  COMPARISON:  12/22/2013.  FINDINGS: Moderate, uncomplicated ascites distends the abdomen.  IMPRESSION: Moderate ascites.   Electronically Signed    By: Lajean Manes M.D.   On: 01/06/2014 15:38   Dg Abd Acute W/chest  01/06/2014   CLINICAL DATA:  Abdominal pain and distension, shortness of breath, history cirrhosis, hepatic steatosis, Guillain-Barre, hepatitis-C  EXAM: ACUTE ABDOMEN SERIES (ABDOMEN 2 VIEW & CHEST 1 VIEW)  COMPARISON:  Chest radiograph 10/30/2013, abdominal radiographs 06/10/2013  FINDINGS: Upper normal heart size.  Normal mediastinal contours and pulmonary vascularity.  Lungs clear.  No pleural effusion or pneumothorax.  Surgical clips RIGHT upper quadrant.  Increased attenuation of abdomen consistent with ascites.  Gaseous distention of transverse colon though this may be artifactually accentuated by significant abdominal distension / AP diameter of the abdomen.  No definite evidence of bowel obstruction or wall thickening.  No free intraperitoneal air.  IMPRESSION: No acute thoracic findings.  Ascites without evidence of bowel obstruction.   Electronically Signed   By: Lavonia Dana M.D.   On: 01/06/2014 16:07    Scheduled Meds: . albumin human  25 g Intravenous BID  . furosemide  60 mg Intravenous BID  . heparin  5,000 Units Subcutaneous 3 times per day  . lactulose  30 g Oral BID  . levothyroxine  50 mcg Oral QAC breakfast  . nicotine  14 mg Transdermal Daily  . pantoprazole  40 mg Oral BID AC  . potassium chloride  10 mEq Oral QID  . pregabalin  50 mg Oral TID  . sodium chloride  250 mL Intravenous Once  . spironolactone  100 mg Oral BID  . thiamine  100 mg Oral Daily   Continuous Infusions: . sodium chloride 75 mL/hr (01/06/14 1646)    Active Problems:   Hypothyroid   ETOH abuse   Chronic pain   Hepatitis C   Hypotension, unspecified   Anasarca   Hypoalbuminemia   Chronic liver disease   Ascites   Anemia   Acute renal failure    Time spent: 21mins    MEMON,JEHANZEB  [facility] Pager [phone]. If 7PM-7AM, please contact night-coverage at [email or web address], password [facility] 01/07/2014, 6:48 PM   LOS: 1 day

## 2014-01-07 NOTE — Progress Notes (Signed)
Late entry:  Dr. Roderic Palau on unit and notified of patient's blood pressure readings.

## 2014-01-07 NOTE — Plan of Care (Signed)
Problem: Phase I Progression Outcomes Goal: Voiding-avoid urinary catheter unless indicated Outcome: Not Progressing Foley catheter placed for urinary retention.        

## 2014-01-07 NOTE — Consult Note (Signed)
Reason for Consult: Acute kidney injury Referring Physician: [name] is an 53 y.o. female.  HPI: She is a patient who has history of liver cirrhosis, Guillain Barre Syndrome and hypothyroidism presently came with the complaints of increase in abdominal girth, difficulty in breathing and decreased urine output for the last couple of days. According to the patient she has been treated with diuretics because of leg swelling. As for leg swelling gets better her urine output started declining. This is also associated with increased in abdominal distention and difficulty in breathing. Patient states that she's feeling much better today after she had parasintesis yesterday. Presently she denies any nausea, vomiting. She complains of having diarrhea. Patient denies any previous history of renal failure.  Past Medical History  Diagnosis Date  . Guillain-Barre   . Blind   . Neuropathy   . Thyroid disease   . Back pain   . Arthritis   . Seizures   . UTI (lower urinary tract infection)   . Anxiety   . Depression   . DDD (degenerative disc disease), lumbar   . Hepatitis C antibody test positive     HCV RNA is negative 03/2013  . Mild diastolic dysfunction 10/27/9676  . Esophagitis, erosive 03/2014  . Hepatic steatosis 04/23/2013  . Hypomagnesemia 04/23/2013  . Cirrhosis of liver without mention of alcohol     ct on 06/06/13= Subtle nodular contour of the liver. Cirrhosis cannot be excluded.   . PUD (peptic ulcer disease)   . Tubular adenoma   . Chronic abdominal pain   . Drug-seeking behavior     per [facility] admission records 01/04/14  . Chronic hypotension   . History of ETOH abuse     Past Surgical History  Procedure Laterality Date  . Cholecystectomy    . Tubal ligation    . Femur fracture surgery    . Ankle surgery    . Back surgery    . Esophagogastroduodenoscopy N/A 03/21/2013    Dr. Gala Romney: severe erosive reflux esophagitis, hiatal hernia, gastric ulcer with  element of partial gastric outlet obstruction secondary to ulcer effect. Likely r/t NSAIDs. negative H.pylori  . Colonoscopy with esophagogastroduodenoscopy (egd) N/A 06/24/2013    RMR: Erosive reflux esophagitis-much improved over that seen previously. Hiatal hernia. Previously noted gastric ulcer completely healed.  Portal gastropathy/TCS:Rectal varices/anal canal hemorrhoids. Portal colopathy.Colonic diverticulosis. Colonic polyp-removed as above    Family History  Problem Relation Age of Onset  . Colon cancer Neg Hx   . Kidney disease Mother     Social History:  reports that she has been smoking Cigarettes.  She has been smoking about 0.50 packs per day. She does not have any smokeless tobacco history on file. She reports that she does not drink alcohol or use illicit drugs.  Allergies:  Allergies  Allergen Reactions  . Neurontin [Gabapentin] Anaphylaxis  . Acetaminophen Itching and Nausea And Vomiting  . Aspirin     Intolerance because of peptic ulcer  . Eggs Or Egg-Derived Products Nausea And Vomiting  . Tramadol     Nausea and vomiting  . Codeine Itching and Nausea And Vomiting    Patient is able to take morphine, demerol and fentanyl with out any type of side effects    Medications: I have reviewed the patient's current medications.  Results for orders placed during the hospital encounter of 01/06/14 (from the past 48 hour(s))  CBC WITH DIFFERENTIAL     Status: Abnormal   Collection  Time    01/06/14  2:13 PM      Result Value Ref Range   WBC 15.0 (*) 4.0 - 10.5 K/uL   RBC 2.56 (*) 3.87 - 5.11 MIL/uL   Hemoglobin 9.3 (*) 12.0 - 15.0 g/dL   HCT 26.3 (*) 36.0 - 46.0 %   MCV 102.7 (*) 78.0 - 100.0 fL   MCH 36.3 (*) 26.0 - 34.0 pg   MCHC 35.4  30.0 - 36.0 g/dL   RDW 14.6  11.5 - 15.5 %   Platelets 138 (*) 150 - 400 K/uL   Neutrophils Relative % 81 (*) 43 - 77 %   Neutro Abs 12.1 (*) 1.7 - 7.7 K/uL   Lymphocytes Relative 10 (*) 12 - 46 %   Lymphs Abs 1.5  0.7 - 4.0 K/uL    Monocytes Relative 9  3 - 12 %   Monocytes Absolute 1.3 (*) 0.1 - 1.0 K/uL   Eosinophils Relative 0  0 - 5 %   Eosinophils Absolute 0.0  0.0 - 0.7 K/uL   Basophils Relative 0  0 - 1 %   Basophils Absolute 0.0  0.0 - 0.1 K/uL  COMPREHENSIVE METABOLIC PANEL     Status: Abnormal   Collection Time    01/06/14  2:13 PM      Result Value Ref Range   Sodium 126 (*) 137 - 147 mEq/L   Potassium 4.7  3.7 - 5.3 mEq/L   Chloride 87 (*) 96 - 112 mEq/L   CO2 18 (*) 19 - 32 mEq/L   Glucose, Bld 87  70 - 99 mg/dL   BUN 39 (*) 6 - 23 mg/dL   Creatinine, Ser 2.47 (*) 0.50 - 1.10 mg/dL   Calcium 8.6  8.4 - 10.5 mg/dL   Total Protein 7.5  6.0 - 8.3 g/dL   Albumin 2.3 (*) 3.5 - 5.2 g/dL   AST 31  0 - 37 U/L   ALT 13  0 - 35 U/L   Alkaline Phosphatase 213 (*) 39 - 117 U/L   Total Bilirubin 0.7  0.3 - 1.2 mg/dL   GFR calc non Af Amer 21 (*) >90 mL/min   GFR calc Af Amer 25 (*) >90 mL/min   Comment: (NOTE)     The eGFR has been calculated using the CKD EPI equation.     This calculation has not been validated in all clinical situations.     eGFR's persistently <90 mL/min signify possible Chronic Kidney     Disease.   Anion gap 21 (*) 5 - 15  LIPASE, BLOOD     Status: None   Collection Time    01/06/14  2:13 PM      Result Value Ref Range   Lipase 20  11 - 59 U/L  AMMONIA     Status: None   Collection Time    01/06/14  2:13 PM      Result Value Ref Range   Ammonia 37  11 - 60 umol/L  PROTIME-INR     Status: Abnormal   Collection Time    01/06/14  2:48 PM      Result Value Ref Range   Prothrombin Time 15.5 (*) 11.6 - 15.2 seconds   INR 1.23  0.00 - 1.49  LACTATE DEHYDROGENASE, BODY FLUID     Status: Abnormal   Collection Time    01/06/14  4:28 PM      Result Value Ref Range   LD, Fluid 30 (*) 3 -  23 U/L   Fluid Type-FLDH ASCITIC     Comment: FLUID     CORRECTED ON 09/25 AT 1650: PREVIOUSLY REPORTED AS PERITONEAL CAVITY  GLUCOSE, PERITONEAL FLUID     Status: None   Collection Time     01/06/14  4:28 PM      Result Value Ref Range   Glucose, Peritoneal Fluid 108     Comment: NO NORMAL RANGE ESTABLISHED FOR THIS TEST  PROTEIN, BODY FLUID     Status: None   Collection Time    01/06/14  4:28 PM      Result Value Ref Range   Total protein, fluid 0.6     Comment: NO NORMAL RANGE ESTABLISHED FOR THIS TEST   Fluid Type-FTP ASCITIC     Comment: FLUID     CORRECTED ON 09/25 AT 1649: PREVIOUSLY REPORTED AS PERITONEAL CAVITY  ALBUMIN, FLUID     Status: None   Collection Time    01/06/14  4:28 PM      Result Value Ref Range   Albumin, Fluid 0.2     Comment: NO NORMAL RANGE ESTABLISHED FOR THIS TEST   Fluid Type-FALB ASCITIC     Comment: FLUID     CORRECTED ON 09/25 AT 1650: PREVIOUSLY REPORTED AS PERITONEAL CAVITY  BODY FLUID CELL COUNT WITH DIFFERENTIAL     Status: Abnormal   Collection Time    01/06/14  4:28 PM      Result Value Ref Range   Fluid Type-FCT ASCITIC     Comment: FLUID     CORRECTED ON 09/25 AT 1649: PREVIOUSLY REPORTED AS PERITONEAL CAVITY   Color, Fluid YELLOW  YELLOW   Appearance, Fluid HAZY (*) CLEAR   WBC, Fluid 66  0 - 1000 cu mm   Neutrophil Count, Fluid 19  0 - 25 %   Lymphs, Fluid 0     Monocyte-Macrophage-Serous Fluid 81  50 - 90 %   Eos, Fluid 0     Other Cells, Fluid OTHER CELLS IDENTIFIED AS MESOTHELIAL CELLS     Comment: PENDING PATHOLOGIST REVIEW  URINALYSIS, ROUTINE W REFLEX MICROSCOPIC     Status: Abnormal   Collection Time    01/06/14  5:26 PM      Result Value Ref Range   Color, Urine YELLOW  YELLOW   APPearance CLOUDY (*) CLEAR   Specific Gravity, Urine 1.015  1.005 - 1.030   pH 5.5  5.0 - 8.0   Glucose, UA NEGATIVE  NEGATIVE mg/dL   Hgb urine dipstick LARGE (*) NEGATIVE   Bilirubin Urine NEGATIVE  NEGATIVE   Ketones, ur NEGATIVE  NEGATIVE mg/dL   Protein, ur NEGATIVE  NEGATIVE mg/dL   Urobilinogen, UA 0.2  0.0 - 1.0 mg/dL   Nitrite NEGATIVE  NEGATIVE   Leukocytes, UA SMALL (*) NEGATIVE  URINE MICROSCOPIC-ADD ON      Status: Abnormal   Collection Time    01/06/14  5:26 PM      Result Value Ref Range   Squamous Epithelial / LPF MANY (*) RARE   WBC, UA 0-2  <3 WBC/hpf   RBC / HPF 3-6  <3 RBC/hpf   Bacteria, UA FEW (*) RARE   Urine-Other MANY YEAST     Comment: LESS THAN 10 mL OF URINE SUBMITTED     MICROSCOPIC EXAM PERFORMED ON UNCONCENTRATED URINE  COMPREHENSIVE METABOLIC PANEL     Status: Abnormal   Collection Time    01/07/14  6:22 AM      Result  Value Ref Range   Sodium 127 (*) 137 - 147 mEq/L   Potassium 4.9  3.7 - 5.3 mEq/L   Chloride 93 (*) 96 - 112 mEq/L   CO2 21  19 - 32 mEq/L   Glucose, Bld 73  70 - 99 mg/dL   BUN 39 (*) 6 - 23 mg/dL   Creatinine, Ser 2.23 (*) 0.50 - 1.10 mg/dL   Calcium 8.1 (*) 8.4 - 10.5 mg/dL   Total Protein 6.1  6.0 - 8.3 g/dL   Albumin 1.9 (*) 3.5 - 5.2 g/dL   AST 30  0 - 37 U/L   ALT 11  0 - 35 U/L   Alkaline Phosphatase 180 (*) 39 - 117 U/L   Total Bilirubin 0.6  0.3 - 1.2 mg/dL   GFR calc non Af Amer 24 (*) >90 mL/min   GFR calc Af Amer 28 (*) >90 mL/min   Comment: (NOTE)     The eGFR has been calculated using the CKD EPI equation.     This calculation has not been validated in all clinical situations.     eGFR's persistently <90 mL/min signify possible Chronic Kidney     Disease.   Anion gap 13  5 - 15  CBC     Status: Abnormal   Collection Time    01/07/14  6:22 AM      Result Value Ref Range   WBC 8.8  4.0 - 10.5 K/uL   RBC 2.16 (*) 3.87 - 5.11 MIL/uL   Hemoglobin 7.9 (*) 12.0 - 15.0 g/dL   HCT 22.0 (*) 36.0 - 46.0 %   MCV 101.9 (*) 78.0 - 100.0 fL   MCH 36.6 (*) 26.0 - 34.0 pg   MCHC 35.9  30.0 - 36.0 g/dL   RDW 14.6  11.5 - 15.5 %   Platelets 116 (*) 150 - 400 K/uL   Comment: SPECIMEN CHECKED FOR CLOTS     PLATELET COUNT CONFIRMED BY SMEAR  RETICULOCYTES     Status: Abnormal   Collection Time    01/07/14  6:22 AM      Result Value Ref Range   Retic Ct Pct 2.2  0.4 - 3.1 %   RBC. 2.16 (*) 3.87 - 5.11 MIL/uL   Retic Count, Manual 47.5   19.0 - 186.0 K/uL    US Abdomen Limited  01/06/2014   CLINICAL DATA:  Distended abdomen.  EXAM: LIMITED ABDOMEN ULTRASOUND FOR ASCITES  TECHNIQUE: Limited ultrasound survey for ascites was performed in all four abdominal quadrants.  COMPARISON:  12/22/2013.  FINDINGS: Moderate, uncomplicated ascites distends the abdomen.  IMPRESSION: Moderate ascites.   Electronically Signed   By: Lajean Manes M.D.   On: 01/06/2014 15:38   Dg Abd Acute W/chest  01/06/2014   CLINICAL DATA:  Abdominal pain and distension, shortness of breath, history cirrhosis, hepatic steatosis, Guillain-Barre, hepatitis-C  EXAM: ACUTE ABDOMEN SERIES (ABDOMEN 2 VIEW & CHEST 1 VIEW)  COMPARISON:  Chest radiograph 10/30/2013, abdominal radiographs 06/10/2013  FINDINGS: Upper normal heart size.  Normal mediastinal contours and pulmonary vascularity.  Lungs clear.  No pleural effusion or pneumothorax.  Surgical clips RIGHT upper quadrant.  Increased attenuation of abdomen consistent with ascites.  Gaseous distention of transverse colon though this may be artifactually accentuated by significant abdominal distension / AP diameter of the abdomen.  No definite evidence of bowel obstruction or wall thickening.  No free intraperitoneal air.  IMPRESSION: No acute thoracic findings.  Ascites without evidence of bowel obstruction.  Electronically Signed   By: Lavonia Dana M.D.   On: 01/06/2014 16:07    Review of Systems  Respiratory: Positive for shortness of breath.   Cardiovascular: Positive for orthopnea and leg swelling.  Gastrointestinal: Positive for diarrhea. Negative for nausea, vomiting and abdominal pain.  Genitourinary:       Decrease urine out put  Neurological: Positive for weakness.   Blood pressure 84/46, pulse 93, temperature 97.9 F (36.6 C), temperature source Oral, resp. rate 16, height 5' 4" (1.626 m), weight 84.097 kg (185 lb 6.4 oz), SpO2 97.00%. Physical Exam  Constitutional: She is oriented to person, place, and  time. No distress.  Eyes: No scleral icterus.  Neck: No JVD present.  Cardiovascular: Normal rate and regular rhythm.   Respiratory: No respiratory distress. She has wheezes. She has no rales.  GI: She exhibits distension. There is no tenderness. There is no rebound.  Musculoskeletal: She exhibits edema.  Neurological: She is alert and oriented to person, place, and time.    Assessment/Plan: Problem #1 acute kidney injury: Patient creatinine about 2 months ago was 0.49. Hence the present increasing in BUN and creatinine seems to be acute. The etiology for her acute renal failure could be secondary to prerenal syndrome/ATN. Since patient has hypotension, hyponatremia as this moment hepatorenal syndrome cannot ruled out. Patient had about 500 cc of urine after cath was placed. Problem #2 liver cirrhosis: This is thought to be secondary to alcohol abuse. Presently she is on lactulose and is alert and oriented. Problem #3 hyponatremia: Hypervolemic hyponatremia Problem #4 history of Guillain Bare Syndrome: Patient states that she has improved significantly except loss of vision. During that episode patient didn't have any respiratory failure and she was not intubated. Problem #5 hypothyroidism Problem #6 history of anemia. Plan: Agree with present treatment. We'll check fractional excretion of sodium and basic metabolic panel in the morning.   [name] S 01/07/2014, 10:12 AM

## 2014-01-07 NOTE — Progress Notes (Signed)
Patient complaining that she is not able to urinate.  Bladder scan read 590 cc.  On call MD notified and orders to place a foley cath received.  Placed 14 french foley cath per MD order.  450 yellow urine with some sediment immediately returned.  Patient tolerated well.  Will continue to monitor.

## 2014-01-08 LAB — CBC
HCT: 19.7 % — ABNORMAL LOW (ref 36.0–46.0)
Hemoglobin: 6.7 g/dL — CL (ref 12.0–15.0)
MCH: 35.4 pg — AB (ref 26.0–34.0)
MCHC: 34 g/dL (ref 30.0–36.0)
MCV: 104.2 fL — ABNORMAL HIGH (ref 78.0–100.0)
PLATELETS: 125 10*3/uL — AB (ref 150–400)
RBC: 1.89 MIL/uL — AB (ref 3.87–5.11)
RDW: 14.6 % (ref 11.5–15.5)
WBC: 5.5 10*3/uL (ref 4.0–10.5)

## 2014-01-08 LAB — BASIC METABOLIC PANEL
ANION GAP: 10 (ref 5–15)
BUN: 31 mg/dL — AB (ref 6–23)
CO2: 22 mEq/L (ref 19–32)
CREATININE: 1.6 mg/dL — AB (ref 0.50–1.10)
Calcium: 8.1 mg/dL — ABNORMAL LOW (ref 8.4–10.5)
Chloride: 96 mEq/L (ref 96–112)
GFR calc Af Amer: 41 mL/min — ABNORMAL LOW (ref >90)
GFR, EST NON AFRICAN AMERICAN: 36 mL/min — AB (ref >90)
Glucose, Bld: 86 mg/dL (ref 70–99)
POTASSIUM: 4.8 meq/L (ref 3.7–5.3)
Sodium: 128 mEq/L — ABNORMAL LOW (ref 137–147)

## 2014-01-08 LAB — URINE CULTURE

## 2014-01-08 LAB — PREPARE RBC (CROSSMATCH)

## 2014-01-08 MED ORDER — SODIUM CHLORIDE 0.9 % IV SOLN
Freq: Once | INTRAVENOUS | Status: AC
  Administered 2014-01-08: 12:00:00 via INTRAVENOUS

## 2014-01-08 MED ORDER — NYSTATIN 100000 UNIT/GM EX CREA
1.0000 "application " | TOPICAL_CREAM | Freq: Two times a day (BID) | CUTANEOUS | Status: DC
  Administered 2014-01-09 – 2014-01-14 (×11): 1 via TOPICAL
  Filled 2014-01-08: qty 15

## 2014-01-08 NOTE — Progress Notes (Signed)
[facility] PROGRESS NOTE  [name] OFB:[id number] DOB: 11/28/1960 DOA: 01/06/2014 PCP: Celedonio Savage, MD  Assessment/Plan: 1. Acute renal failure.  Possibly related to intravascular volume depletion in the setting of higher dose diuretics. She is on IV hydration. Nephrology following. Monitor urine output 2. Cirrhosis with ascites and anasarca.  Patient has hypoalbuminemia that is likely contributing to her anasarca and ascites. She will likely need a large volume paracentesis in the next 1-2 days.  She has been started on albumin infusions coupled with intravenous lasix. She is also on aldactone. Her cirrhosis is likely secondary to alcohol and possibly fatty liver. Will check hepatitis panel. ANA, anti-smooth muscle antibody and anti mitochondrial antibodies were checked recently and found to be negative. 3. Hypotension.  Possibly intravascularly volume depleted.  Continue IVF. Echo shows normal ejection fraction. 4. Hyponatremia. She does have evidence of volume overload. Would continue with lasix and observe. 5. Anemia, likely related to chronic disease with an acute component related to hemodilution. No evidence of gross bleeding. Stool occult blood is pending. We'll transfuse 2 units PRBCs today. 6. Hypothyroidism.  Continue synthroid 7. Chronic pain.  Will have to be cautious with opiates at this time due to low blood pressures. 8. COPD appears stable at this time. No shortness of breath or wheezing.   Code Status: full code Family Communication: discussed with patient Disposition Plan: discharge home once improved   Consultants:  Nephrology  Procedures:  Paracentesis in ED with removal of 460cc of fluid  Echo:- Left ventricle: The cavity size was normal. Systolic function was normal. The estimated ejection fraction was in the range of 55% to 60%. Wall motion was normal; there were no regional wall motion abnormalities.     Antibiotics:    HPI/Subjective: No  new complaints. She is very anxious.  Objective: Filed Vitals:   01/08/14 1650  BP: 90/58  Pulse: 91  Temp: 98.7 F (37.1 C)  Resp: 16    Intake/Output Summary (Last 24 hours) at 01/08/14 1733 Last data filed at 01/08/14 1650  Gross per 24 hour  Intake 1117.67 ml  Output   1550 ml  Net -432.33 ml   Filed Weights   01/06/14 1745 01/07/14 0500 01/08/14 0500  Weight: 77.066 kg (169 lb 14.4 oz) 84.097 kg (185 lb 6.4 oz) 83.689 kg (184 lb 8 oz)    Exam:   General:  NAD  Cardiovascular: s1, s2, rrr  Respiratory: crackles at bases  Abdomen: soft, distended, bs+  Musculoskeletal: 1+ edema b/l   Data Reviewed: Basic Metabolic Panel:  Recent Labs Lab 01/06/14 1413 01/07/14 0622 01/08/14 0640  NA 126* 127* 128*  K 4.7 4.9 4.8  CL 87* 93* 96  CO2 18* 21 22  GLUCOSE 87 73 86  BUN 39* 39* 31*  CREATININE 2.47* 2.23* 1.60*  CALCIUM 8.6 8.1* 8.1*   Liver Function Tests:  Recent Labs Lab 01/06/14 1413 01/07/14 0622  AST 31 30  ALT 13 11  ALKPHOS 213* 180*  BILITOT 0.7 0.6  PROT 7.5 6.1  ALBUMIN 2.3* 1.9*    Recent Labs Lab 01/06/14 1413  LIPASE 20    Recent Labs Lab 01/06/14 1413 01/07/14 1736  AMMONIA 37 49   CBC:  Recent Labs Lab 01/06/14 1413 01/07/14 0622 01/08/14 0640  WBC 15.0* 8.8 5.5  NEUTROABS 12.1*  --   --   HGB 9.3* 7.9* 6.7*  HCT 26.3* 22.0* 19.7*  MCV 102.7* 101.9* 104.2*  PLT 138* 116* 125*   Cardiac  Enzymes: No results found for this basename: CKTOTAL, CKMB, CKMBINDEX, TROPONINI,  in the last 168 hours BNP (last 3 results)  Recent Labs  04/22/13 1047 10/30/13 1612  PROBNP 254.9* 111.9   CBG: No results found for this basename: GLUCAP,  in the last 168 hours  Recent Results (from the past 240 hour(s))  BODY FLUID CULTURE     Status: None   Collection Time    01/06/14  4:28 PM      Result Value Ref Range Status   Specimen Description ASCITIC FLUID   Final   Special Requests NONE   Final   Gram Stain      Final   Value: NO WBC SEEN     NO ORGANISMS SEEN     Performed at [facility]   Culture     Final   Value: NO GROWTH 2 DAYS     Performed at [facility]   Report Status PENDING   Incomplete  URINE CULTURE     Status: None   Collection Time    01/06/14  5:26 PM      Result Value Ref Range Status   Specimen Description URINE, CATHETERIZED   Final   Special Requests NONE   Final   Culture  Setup Time     Final   Value: 01/07/2014 00:32     Performed at [place]     Final   Value: 40,000 COLONIES/ML     Performed at [facility]   Culture     Final   Value: YEAST     Performed at [facility]   Report Status 01/08/2014 FINAL   Final     Studies: No results found.  Scheduled Meds: . albumin human  25 g Intravenous BID  . furosemide  60 mg Intravenous BID  . heparin  5,000 Units Subcutaneous 3 times per day  . lactulose  30 g Oral BID  . levothyroxine  50 mcg Oral QAC breakfast  . nicotine  14 mg Transdermal Daily  . nystatin cream  1 application Topical BID  . pantoprazole  40 mg Oral BID AC  . potassium chloride  10 mEq Oral QID  . pregabalin  50 mg Oral TID  . sodium chloride  250 mL Intravenous Once  . spironolactone  100 mg Oral BID  . thiamine  100 mg Oral Daily   Continuous Infusions: . sodium chloride 75 mL/hr at 01/07/14 1904    Active Problems:   Hypothyroid   ETOH abuse   Chronic pain   Hepatitis C   Hypotension, unspecified   Anasarca   Hypoalbuminemia   Chronic liver disease   Ascites   Anemia   Acute renal failure   Hyponatremia    Time spent: 25mins    MEMON,JEHANZEB  [facility] Pager [phone]. If 7PM-7AM, please contact night-coverage at [email or web address], password [facility] 01/08/2014, 5:33 PM  LOS: 2 days

## 2014-01-08 NOTE — Progress Notes (Signed)
CRITICAL VALUE ALERT  Critical value received:  HgB 6.7  Date of notification:  01/08/2014   Time of notification:  07:39  Critical value read back:Yes.    Nurse who received alert:  Jovita Kussmaul, RN  MD notified (1st page):  Memon  Time of first page:  07:40  MD notified (2nd page):  Time of second page:  Responding MD:  Roderic Palau  Time MD responded:  07:46

## 2014-01-08 NOTE — Progress Notes (Signed)
Subjective: Interval History: has no complaint of difficulty in breathing. No nausea or vomiting..  Objective: Vital signs in last 24 hours: Temp:  [98 F (36.7 C)-99.1 F (37.3 C)] 98.5 F (36.9 C) (09/27 0529) Pulse Rate:  [47-103] 88 (09/27 0928) Resp:  [16-18] 18 (09/27 0529) BP: (78-93)/(43-62) 80/50 mmHg (09/27 1012) SpO2:  [93 %-98 %] 94 % (09/27 0928) Weight:  [83.689 kg (184 lb 8 oz)] 83.689 kg (184 lb 8 oz) (09/27 0500) Weight change: 4.309 kg (9 lb 8 oz)  Intake/Output from previous day: 09/26 0701 - 09/27 0700 In: 920 [P.O.:300; I.V.:620] Out: 800 [Urine:800] Intake/Output this shift:    General appearance: alert, cooperative and no distress Resp: diminished breath sounds posterior - bilateral Cardio: regular rate and rhythm, S1, S2 normal, no murmur, click, rub or gallop GI: abdominal distension. none tneder Extremities: edema bilateral edema  Lab Results:  Recent Labs  01/07/14 0622 01/08/14 0640  WBC 8.8 5.5  HGB 7.9* 6.7*  HCT 22.0* 19.7*  PLT 116* 125*   BMET:  Recent Labs  01/07/14 0622 01/08/14 0640  NA 127* 128*  K 4.9 4.8  CL 93* 96  CO2 21 22  GLUCOSE 73 86  BUN 39* 31*  CREATININE 2.23* 1.60*  CALCIUM 8.1* 8.1*   No results found for this basename: PTH,  in the last 72 hours Iron Studies:  Recent Labs  01/07/14 0622  FERRITIN 1004*    Studies/Results: US Abdomen Limited  01/06/2014   CLINICAL DATA:  Distended abdomen.  EXAM: LIMITED ABDOMEN ULTRASOUND FOR ASCITES  TECHNIQUE: Limited ultrasound survey for ascites was performed in all four abdominal quadrants.  COMPARISON:  12/22/2013.  FINDINGS: Moderate, uncomplicated ascites distends the abdomen.  IMPRESSION: Moderate ascites.   Electronically Signed   By: Lajean Manes M.D.   On: 01/06/2014 15:38   Dg Abd Acute W/chest  01/06/2014   CLINICAL DATA:  Abdominal pain and distension, shortness of breath, history cirrhosis, hepatic steatosis, Guillain-Barre, hepatitis-C  EXAM:  ACUTE ABDOMEN SERIES (ABDOMEN 2 VIEW & CHEST 1 VIEW)  COMPARISON:  Chest radiograph 10/30/2013, abdominal radiographs 06/10/2013  FINDINGS: Upper normal heart size.  Normal mediastinal contours and pulmonary vascularity.  Lungs clear.  No pleural effusion or pneumothorax.  Surgical clips RIGHT upper quadrant.  Increased attenuation of abdomen consistent with ascites.  Gaseous distention of transverse colon though this may be artifactually accentuated by significant abdominal distension / AP diameter of the abdomen.  No definite evidence of bowel obstruction or wall thickening.  No free intraperitoneal air.  IMPRESSION: No acute thoracic findings.  Ascites without evidence of bowel obstruction.   Electronically Signed   By: Lavonia Dana M.D.   On: 01/06/2014 16:07    I have reviewed the patient's current medications.  Assessment/Plan: Problem#1 AKI: Her renal function is improving. None oliguric. No nausea or vomiting. Problem#2 Liver cirrhosis Problem#3 Hyponatremia: Hypervolemic hyponatremia her sodium is improving Problem#4 Anemia: Her hemoglobin is declining Problem#5 Hypothyroidism Problem#6 Ascites: Plan: free water restriction of 250 cc/day Basic metabolic panel in am  LOS: 2 days   Chales Pelissier S 01/08/2014,10:14 AM

## 2014-01-08 NOTE — Progress Notes (Signed)
Pt. Still requesting something for rash under breast.  Notified MD.

## 2014-01-08 NOTE — Progress Notes (Signed)
*  Echocardiogram 2D Echocardiogram has been performed.  [name] 01/08/2014, 11:10 AM

## 2014-01-08 NOTE — Progress Notes (Signed)
Patient asking for IV pain medication.  Explained to patient that her blood pressure was very low and that it would be unsafe for her to get the IV pain medication at this time because it could drop her blood pressure further and may result in her having to be transferred to a more acute level of care.  Patient expressed understanding.  Patient was given PO pain medication per MD order and is currently resting in her room.

## 2014-01-09 LAB — BASIC METABOLIC PANEL
ANION GAP: 12 (ref 5–15)
BUN: 24 mg/dL — ABNORMAL HIGH (ref 6–23)
CALCIUM: 8.1 mg/dL — AB (ref 8.4–10.5)
CO2: 22 mEq/L (ref 19–32)
Chloride: 99 mEq/L (ref 96–112)
Creatinine, Ser: 1.14 mg/dL — ABNORMAL HIGH (ref 0.50–1.10)
GFR calc Af Amer: 62 mL/min — ABNORMAL LOW (ref >90)
GFR calc non Af Amer: 54 mL/min — ABNORMAL LOW (ref >90)
Glucose, Bld: 152 mg/dL — ABNORMAL HIGH (ref 70–99)
Potassium: 4.3 mEq/L (ref 3.7–5.3)
SODIUM: 133 meq/L — AB (ref 137–147)

## 2014-01-09 LAB — TYPE AND SCREEN
ABO/RH(D): B POS
Antibody Screen: NEGATIVE
UNIT DIVISION: 0
UNIT DIVISION: 0

## 2014-01-09 LAB — CBC
HCT: 27 % — ABNORMAL LOW (ref 36.0–46.0)
Hemoglobin: 9.4 g/dL — ABNORMAL LOW (ref 12.0–15.0)
MCH: 34.3 pg — ABNORMAL HIGH (ref 26.0–34.0)
MCHC: 34.8 g/dL (ref 30.0–36.0)
MCV: 98.5 fL (ref 78.0–100.0)
PLATELETS: 124 10*3/uL — AB (ref 150–400)
RBC: 2.74 MIL/uL — ABNORMAL LOW (ref 3.87–5.11)
RDW: 18.6 % — ABNORMAL HIGH (ref 11.5–15.5)
WBC: 6.5 10*3/uL (ref 4.0–10.5)

## 2014-01-09 LAB — PATHOLOGIST SMEAR REVIEW

## 2014-01-09 LAB — HEPATITIS PANEL, ACUTE
HCV AB: REACTIVE — AB
Hep A IgM: NONREACTIVE
Hep B C IgM: NONREACTIVE
Hepatitis B Surface Ag: NEGATIVE

## 2014-01-09 MED ORDER — MIDODRINE HCL 5 MG PO TABS
5.0000 mg | ORAL_TABLET | Freq: Three times a day (TID) | ORAL | Status: DC
  Administered 2014-01-09 – 2014-01-14 (×15): 5 mg via ORAL
  Filled 2014-01-09 (×15): qty 1

## 2014-01-09 MED ORDER — MIDODRINE HCL 5 MG PO TABS: 5.0000 mg | ORAL_TABLET | Freq: Three times a day (TID) | ORAL | Status: DC

## 2014-01-09 MED ORDER — FUROSEMIDE 40 MG PO TABS
40.0000 mg | ORAL_TABLET | Freq: Every morning | ORAL | Status: DC
  Administered 2014-01-09 – 2014-01-14 (×6): 40 mg via ORAL
  Filled 2014-01-09 (×6): qty 1

## 2014-01-09 NOTE — Progress Notes (Signed)
[facility] PROGRESS NOTE  [name] WJX:[id number] DOB: 05/25/1960 DOA: 01/06/2014 PCP: Celedonio Savage, MD  Assessment/Plan: 1. Acute renal failure.  Possibly related to intravascular volume depletion in the setting of higher dose diuretics. She was on IV hydration. Nephrology following. Monitor urine output. Renal function significantly improved. 2. Cirrhosis with ascites and anasarca.  Patient has hypoalbuminemia that is likely contributing to her anasarca and ascites. She will likely need a large volume paracentesis in the next 1-2 days.  She has been started on albumin infusions coupled with intravenous lasix. She is also on aldactone. Her cirrhosis is likely secondary to alcohol and possibly fatty liver. Will check hepatitis panel. ANA, anti-smooth muscle antibody and anti mitochondrial antibodies were checked recently and found to be negative. Will attempt paracentesis in am. 3. Hypotension.  Possibly intravascularly volume depleted.  Continue IVF. Echo shows normal ejection fraction. Will also start on low dose midodrine. 4. Hyponatremia. She does have evidence of volume overload. Would continue with lasix and observe. Appears to be improving. 5. Anemia, likely related to chronic disease with an acute component related to hemodilution. No evidence of gross bleeding. Stool occult blood is pending. She was transfused 2 units prbc thus far. 6. Hypothyroidism.  Continue synthroid 7. Chronic pain.  Will have to be cautious with opiates at this time due to low blood pressures. 8. COPD appears stable at this time. No shortness of breath or wheezing.   Code Status: full code Family Communication: discussed with patient Disposition Plan: discharge home once improved   Consultants:  Nephrology  Procedures:  Paracentesis in ED with removal of 460cc of fluid  Echo:- Left ventricle: The cavity size was normal. Systolic function was normal. The estimated ejection fraction was in the  range of 55% to 60%. Wall motion was normal; there were no regional wall motion abnormalities.     Antibiotics:    HPI/Subjective: Feels very weak. No new complaints.  Objective: Filed Vitals:   01/09/14 1421  BP: 84/55  Pulse: 94  Temp: 98.2 F (36.8 C)  Resp: 16    Intake/Output Summary (Last 24 hours) at 01/09/14 1954 Last data filed at 01/09/14 1900  Gross per 24 hour  Intake 2179.75 ml  Output      0 ml  Net 2179.75 ml   Filed Weights   01/07/14 0500 01/08/14 0500 01/09/14 0436  Weight: 84.097 kg (185 lb 6.4 oz) 83.689 kg (184 lb 8 oz) 84.052 kg (185 lb 4.8 oz)    Exam:   General:  NAD  Cardiovascular: s1, s2, rrr  Respiratory: crackles at bases  Abdomen: soft, distended, bs+  Musculoskeletal: 1+ edema b/l   Data Reviewed: Basic Metabolic Panel:  Recent Labs Lab 01/06/14 1413 01/07/14 0622 01/08/14 0640 01/09/14 0638  NA 126* 127* 128* 133*  K 4.7 4.9 4.8 4.3  CL 87* 93* 96 99  CO2 18* 21 22 22   GLUCOSE 87 73 86 152*  BUN 39* 39* 31* 24*  CREATININE 2.47* 2.23* 1.60* 1.14*  CALCIUM 8.6 8.1* 8.1* 8.1*   Liver Function Tests:  Recent Labs Lab 01/06/14 1413 01/07/14 0622  AST 31 30  ALT 13 11  ALKPHOS 213* 180*  BILITOT 0.7 0.6  PROT 7.5 6.1  ALBUMIN 2.3* 1.9*    Recent Labs Lab 01/06/14 1413  LIPASE 20    Recent Labs Lab 01/06/14 1413 01/07/14 1736  AMMONIA 37 49   CBC:  Recent Labs Lab 01/06/14 1413 01/07/14 0622 01/08/14 0640 01/09/14 2130  WBC 15.0* 8.8 5.5 6.5  NEUTROABS 12.1*  --   --   --   HGB 9.3* 7.9* 6.7* 9.4*  HCT 26.3* 22.0* 19.7* 27.0*  MCV 102.7* 101.9* 104.2* 98.5  PLT 138* 116* 125* 124*   Cardiac Enzymes: No results found for this basename: CKTOTAL, CKMB, CKMBINDEX, TROPONINI,  in the last 168 hours BNP (last 3 results)  Recent Labs  04/22/13 1047 10/30/13 1612  PROBNP 254.9* 111.9   CBG: No results found for this basename: GLUCAP,  in the last 168 hours  Recent Results (from  the past 240 hour(s))  BODY FLUID CULTURE     Status: None   Collection Time    01/06/14  4:28 PM      Result Value Ref Range Status   Specimen Description ASCITIC FLUID   Final   Special Requests NONE   Final   Gram Stain     Final   Value: NO WBC SEEN     NO ORGANISMS SEEN     Performed at [facility]   Culture     Final   Value: NO GROWTH 2 DAYS     Performed at [facility]   Report Status PENDING   Incomplete  URINE CULTURE     Status: None   Collection Time    01/06/14  5:26 PM      Result Value Ref Range Status   Specimen Description URINE, CATHETERIZED   Final   Special Requests NONE   Final   Culture  Setup Time     Final   Value: 01/07/2014 00:32     Performed at [place]     Final   Value: 40,000 COLONIES/ML     Performed at [facility]   Culture     Final   Value: YEAST     Performed at [facility]   Report Status 01/08/2014 FINAL   Final     Studies: No results found.  Scheduled Meds: . furosemide  40 mg Oral q morning - 10a  . heparin  5,000 Units Subcutaneous 3 times per day  . lactulose  30 g Oral BID  . levothyroxine  50 mcg Oral QAC breakfast  . midodrine  5 mg Oral TID WC  . nicotine  14 mg Transdermal Daily  . nystatin cream  1 application Topical BID  . pantoprazole  40 mg Oral BID AC  . potassium chloride  10 mEq Oral QID  . pregabalin  50 mg Oral TID  . sodium chloride  250 mL Intravenous Once  . spironolactone  100 mg Oral BID  . thiamine  100 mg Oral Daily   Continuous Infusions:    Active Problems:   Hypothyroid   ETOH abuse   Chronic pain   Hepatitis C   Hypotension, unspecified   Anasarca   Hypoalbuminemia   Chronic liver disease   Ascites   Anemia   Acute renal failure   Hyponatremia    Time spent: 69mins    MEMON,JEHANZEB  [facility] Pager [phone]. If 7PM-7AM, please contact night-coverage at [email or web address], password [facility] 01/09/2014, 7:54 PM   LOS: 3 days

## 2014-01-09 NOTE — Evaluation (Signed)
Physical Therapy Evaluation Patient Details Name: [name] MRN: [id number] DOB: [birth year] Today's Date: 01/09/2014   History of Present Illness  Pt is a 53 year old female with multiple chronic medical problems which have resulted in her having very limited mobility and she is now w/c dependent.  Pt is admitted with ascites secondary to cirrhosis, acute renal failure, hyponatremia and anemia.  She has chronic hypotension.  She has chronic leg and back pain resulting from [facility]. She requires assist for all ADLs  and transfers.  She has a friend who comes over to help her with this.  She lives with her husband.    Clinical Impression   Pt was seen for a PT evaluation.  She did not c/o any pain during my visit.  She is generally deconditioned with decreased strength throughout.  Her abdomen appears to be quite distended and she refused bed mobility and transfers because of this.  She was able to tolerate gentle ROM/strengthening exercise to LEs.  We discussed the importance of increasing her mobility in order to avoid increased weakness.    Follow Up Recommendations Home health PT    Equipment Recommendations  None recommended by PT    Recommendations for Other Services   OT    Precautions / Restrictions Precautions Precautions: Fall Restrictions Weight Bearing Restrictions: No      Mobility  Bed Mobility               General bed mobility comments: pt refused bed mobility or transfers secondary to discomfort from ascites  Transfers                    Ambulation/Gait                Stairs            Wheelchair Mobility    Modified Rankin (Stroke Patients Only)       Balance Overall balance assessment:  (not tested)                                           Pertinent Vitals/Pain Pain Assessment: No/denies pain    Home Living Family/patient expects to be discharged to:: Private residence Living Arrangements:  Spouse/significant other Available Help at Discharge: Family;Friend(s);Available 24 hours/day Type of Home: House       Home Layout: One level Home Equipment: Wheelchair - manual;Bedside commode;Hospital bed;Grab bars - tub/shower;Walker - 4 wheels      Prior Function Level of Independence: Needs assistance   Gait / Transfers Assistance Needed: needs assist with transfer to w/c...non ambulatory  ADL's / Homemaking Assistance Needed: needs assist with bathing and dressing        Hand Dominance        Extremity/Trunk Assessment               Lower Extremity Assessment: Generalized weakness (strength generally 3/5 throughout)         Communication      Cognition Arousal/Alertness: Awake/alert Behavior During Therapy: WFL for tasks assessed/performed Overall Cognitive Status: Within Functional Limits for tasks assessed                      General Comments      Exercises General Exercises - Upper Extremity Shoulder Flexion: AROM;Both;5 reps;Supine Shoulder ABduction: AROM;Both;5 reps;Supine General Exercises - Lower Extremity Ankle  Circles/Pumps: AROM;Both;10 reps;Supine Quad Sets: AROM;Both;10 reps;Supine Gluteal Sets: AROM;Both;10 reps;Supine Short Arc Quad: AROM;Both;10 reps;Supine Heel Slides: AROM;AAROM;Both;10 reps;Supine Hip ABduction/ADduction: AROM;AAROM;Both;10 reps;Supine      Assessment/Plan    PT Assessment Patient needs continued PT services  PT Diagnosis Generalized weakness   PT Problem List Decreased strength;Decreased activity tolerance;Decreased mobility;Obesity;Pain  PT Treatment Interventions Functional mobility training;Therapeutic exercise   PT Goals (Current goals can be found in the Care Plan section) Acute Rehab PT Goals Patient Stated Goal: wants to get stronger PT Goal Formulation: With patient Time For Goal Achievement: 01/23/14 Potential to Achieve Goals: Fair    Frequency Min 3X/week   Barriers to  discharge   none    Co-evaluation               End of Session Equipment Utilized During Treatment: Gait belt Activity Tolerance: Patient tolerated treatment well Patient left: in bed;with call bell/phone within reach;with bed alarm set Nurse Communication: Mobility status         Time: 0940-7680 PT Time Calculation (min): 42 min   Charges:   PT Evaluation $Initial PT Evaluation Tier I: 1 Procedure PT Treatments $Therapeutic Exercise: 8-22 mins   PT G Codes:          Sable Feil 01/09/2014, 2:31 PM

## 2014-01-09 NOTE — Progress Notes (Signed)
Subjective: Interval History: patient feels better.Denies any difficulty in breathing  Objective: Vital signs in last 24 hours: Temp:  [97.8 F (36.6 C)-98.7 F (37.1 C)] 98.3 F (36.8 C) (09/28 0436) Pulse Rate:  [82-92] 91 (09/28 0436) Resp:  [16-18] 16 (09/28 0436) BP: (78-92)/(44-60) 81/50 mmHg (09/28 0436) SpO2:  [91 %-99 %] 93 % (09/28 0436) Weight:  [84.052 kg (185 lb 4.8 oz)] 84.052 kg (185 lb 4.8 oz) (09/28 0436) Weight change: 0.363 kg (12.8 oz)  Intake/Output from previous day: 09/27 0701 - 09/28 0700 In: 3158.7 [P.O.:480; I.V.:1692.5; Blood:786.2; IV Piggyback:200] Out: 1500 [Urine:1500] Intake/Output this shift:    General appearance: alert, cooperative and no distress Resp: diminished breath sounds posterior - bilateral Cardio: regular rate and rhythm, S1, S2 normal, no murmur, click, rub or gallop GI: abdominal distension. none tneder Extremities: edema bilateral edema  Lab Results:  Recent Labs  01/08/14 0640 01/09/14 0638  WBC 5.5 6.5  HGB 6.7* 9.4*  HCT 19.7* 27.0*  PLT 125* 124*   BMET:   Recent Labs  01/08/14 0640 01/09/14 0638  NA 128* 133*  K 4.8 4.3  CL 96 99  CO2 22 22  GLUCOSE 86 152*  BUN 31* 24*  CREATININE 1.60* 1.14*  CALCIUM 8.1* 8.1*   No results found for this basename: PTH,  in the last 72 hours Iron Studies:   Recent Labs  01/07/14 0622  FERRITIN 1004*    Studies/Results: No results found.  I have reviewed the patient's current medications.  Assessment/Plan: Problem#1 AKI: Her renal function continue to improve and is returning to her base line Problem#2 Liver cirrhosis Problem#3 Hyponatremia: Hypervolemic hyponatremia her sodium has improved and patient is on free water restriction. Problem#4 Anemia: Her hemoglobin is low but has improved after blood transfusion Problem#5 Hypothyroidism Problem#6 Anasarca: Patient none oliguric and improving. Plan: d/c iv lasix D/c ivf Start on Lasix 40 mg po once a  day Basic metabolic panel in am  LOS: 3 days   Parker Wherley S 01/09/2014,8:07 AM

## 2014-01-10 ENCOUNTER — Inpatient Hospital Stay (HOSPITAL_COMMUNITY): Payer: Medicare Other

## 2014-01-10 ENCOUNTER — Encounter (HOSPITAL_COMMUNITY): Payer: Self-pay

## 2014-01-10 LAB — BODY FLUID CELL COUNT WITH DIFFERENTIAL
Eos, Fluid: 0 %
Lymphs, Fluid: 14 %
Monocyte-Macrophage-Serous Fluid: 61 % (ref 50–90)
NEUTROPHIL FLUID: 25 % (ref 0–25)
Total Nucleated Cell Count, Fluid: 180 cu mm (ref 0–1000)

## 2014-01-10 LAB — UIFE/LIGHT CHAINS/TP QN, 24-HR UR
ALBUMIN, U: DETECTED
ALPHA 1 UR: DETECTED — AB
Alpha 2, Urine: DETECTED — AB
Beta, Urine: DETECTED — AB
Gamma Globulin, Urine: DETECTED — AB
Total Protein, Urine: 8 mg/dL (ref 5–24)

## 2014-01-10 LAB — BODY FLUID CULTURE
Culture: NO GROWTH
GRAM STAIN: NONE SEEN

## 2014-01-10 LAB — COMPREHENSIVE METABOLIC PANEL
ALK PHOS: 147 U/L — AB (ref 39–117)
ALT: 9 U/L (ref 0–35)
AST: 31 U/L (ref 0–37)
Albumin: 2.7 g/dL — ABNORMAL LOW (ref 3.5–5.2)
Anion gap: 12 (ref 5–15)
BUN: 17 mg/dL (ref 6–23)
CALCIUM: 8.2 mg/dL — AB (ref 8.4–10.5)
CO2: 23 meq/L (ref 19–32)
Chloride: 99 mEq/L (ref 96–112)
Creatinine, Ser: 0.9 mg/dL (ref 0.50–1.10)
GFR calc Af Amer: 83 mL/min — ABNORMAL LOW (ref >90)
GFR, EST NON AFRICAN AMERICAN: 72 mL/min — AB (ref >90)
Glucose, Bld: 81 mg/dL (ref 70–99)
POTASSIUM: 5 meq/L (ref 3.7–5.3)
SODIUM: 134 meq/L — AB (ref 137–147)
TOTAL PROTEIN: 6.2 g/dL (ref 6.0–8.3)
Total Bilirubin: 1.1 mg/dL (ref 0.3–1.2)

## 2014-01-10 LAB — PROTEIN ELECTROPHORESIS, SERUM
Albumin ELP: 38.7 % — ABNORMAL LOW (ref 55.8–66.1)
Alpha-1-Globulin: 5.3 % — ABNORMAL HIGH (ref 2.9–4.9)
Alpha-2-Globulin: 9.2 % (ref 7.1–11.8)
Beta 2: 15.1 % — ABNORMAL HIGH (ref 3.2–6.5)
Beta Globulin: 4.9 % (ref 4.7–7.2)
Gamma Globulin: 26.8 % — ABNORMAL HIGH (ref 11.1–18.8)
M-Spike, %: NOT DETECTED g/dL
Total Protein ELP: 5.2 g/dL — ABNORMAL LOW (ref 6.0–8.3)

## 2014-01-10 LAB — CBC
HCT: 29.7 % — ABNORMAL LOW (ref 36.0–46.0)
HEMOGLOBIN: 10 g/dL — AB (ref 12.0–15.0)
MCH: 33.9 pg (ref 26.0–34.0)
MCHC: 33.7 g/dL (ref 30.0–36.0)
MCV: 100.7 fL — AB (ref 78.0–100.0)
Platelets: 134 10*3/uL — ABNORMAL LOW (ref 150–400)
RBC: 2.95 MIL/uL — ABNORMAL LOW (ref 3.87–5.11)
RDW: 17.9 % — ABNORMAL HIGH (ref 11.5–15.5)
WBC: 8 10*3/uL (ref 4.0–10.5)

## 2014-01-10 LAB — ALBUMIN, FLUID (OTHER): Albumin, Fluid: 0.8 g/dL

## 2014-01-10 LAB — DIFFERENTIAL
Basophils Absolute: 0 10*3/uL (ref 0.0–0.1)
Basophils Relative: 0 % (ref 0–1)
EOS ABS: 0.1 10*3/uL (ref 0.0–0.7)
EOS PCT: 1 % (ref 0–5)
Lymphocytes Relative: 16 % (ref 12–46)
Lymphs Abs: 1.3 10*3/uL (ref 0.7–4.0)
MONOS PCT: 9 % (ref 3–12)
Monocytes Absolute: 0.7 10*3/uL (ref 0.1–1.0)
Neutro Abs: 6.1 10*3/uL (ref 1.7–7.7)
Neutrophils Relative %: 74 % (ref 43–77)

## 2014-01-10 NOTE — Progress Notes (Signed)
Pt states that she is too tired for PT today.  She states that she is scheduled to undergo paracentesis.  She feels that she will be able to work with [place] tomorrow.

## 2014-01-10 NOTE — Progress Notes (Signed)
[facility] PROGRESS NOTE  [name] NLG:[id number] DOB: [birth year] DOA: 01/06/2014 PCP: Celedonio Savage, MD  Summary:  This patient presented to the hospital with complaints of abdominal pain/distention which worsened over the past 7 days prior to admission. She had left at [place] on 9/24 with aorta managing her pain adequately and were not performing a paracentesis. Her primary care physician increased her dose of Lasix from 40 to 80 mg daily. She had noticed decreased urine output for one week prior to admission. She was evaluated in the emergency room and noted to be in acute renal failure, she was hypotensive and had significant ascites. She was admitted to the hospital for further treatments. She was started on intravenous Lasix with albumin infusions, as well as intravenous fluids. She was followed by nephrology and her renal function has since returned to normal range. She underwent paracentesis during her hospital stay with removal of 4.3 L of fluid. It does not appear she has any underlying infection at this time. She is noted to be significantly hypotensive, although she does not appear septic or toxic. She's been started on midodrine. We will also check TSH and cortisol. Once her blood pressure stabilizes, then she could be discharged home with home health services.  Assessment/Plan: 1. Acute renal failure.  Possibly related to intravascular volume depletion in the setting of higher dose diuretics. She was on IV hydration. Nephrology following. Monitor urine output. Renal function significantly improved. 2. Cirrhosis with ascites and anasarca.  Patient has hypoalbuminemia that is likely contributing to her anasarca and ascites. She received albumin infusions coupled with Lasix.. She is also on aldactone. Her cirrhosis is likely secondary to alcohol and possibly fatty liver. Hepatitis panel was positive for C. antibody. We'll check HCV RNA. ANA, anti-smooth  muscle antibody and anti mitochondrial antibodies were checked recently and found to be negative. Paracentesis done on 9/29 with removal of 4.3 L of fluid. She will followup with her gastroenterologist and it in for further workup/management. 3. Hypotension. Echo shows normal ejection fraction. Patient does not appear to be significantly symptomatic, although she does report dizziness on standing. She's been started on midodrine. We'll check TSH and cortisol. 4. Hyponatremia. Hypervolemic. Improved with Lasix. 5. Anemia, likely related to chronic disease with an acute component related to hemodilution. No evidence of gross bleeding. Stool occult blood is pending. She was transfused 2 units prbc thus far. 6. Hypothyroidism.  Continue synthroid 7. Chronic pain.  Will have to be cautious with opiates at this time due to low blood pressures. 8. COPD appears stable at this time. No shortness of breath or wheezing.   Code Status: full code Family Communication: discussed with patient Disposition Plan: discharge home once improved   Consultants:  Nephrology  Procedures:  Paracentesis in ED with removal of 460cc of fluid  Echo:- Left ventricle: The cavity size was normal. Systolic function was normal. The estimated ejection fraction was in the range of 55% to 60%. Wall motion was normal; there were no regional wall motion abnormalities.   Paracentesis by radiology with removal of 4.3 L of fluid on 9/29  Antibiotics:    HPI/Subjective: Patient is tearful, feels weak, no new complaints otherwise  Objective: Filed Vitals:   01/10/14 1800  BP: 78/49  Pulse:   Temp:   Resp:     Intake/Output Summary (Last 24 hours) at 01/10/14 2137 Last data filed at 01/10/14 1500  Gross per 24 hour  Intake    480  ml  Output      0 ml  Net    480 ml   Filed Weights   01/08/14 0500 01/09/14 0436 01/10/14 0500  Weight: 83.689 kg (184 lb 8 oz) 84.052 kg (185 lb 4.8 oz) 82.328 kg (181 lb 8 oz)     Exam:   General:  NAD  Cardiovascular: s1, s2, rrr  Respiratory: crackles at bases  Abdomen: soft, distention has improved, bs+  Musculoskeletal: 1+ edema b/l   Data Reviewed: Basic Metabolic Panel:  Recent Labs Lab 01/06/14 1413 01/07/14 0622 01/08/14 0640 01/09/14 0638 01/10/14 0555  NA 126* 127* 128* 133* 134*  K 4.7 4.9 4.8 4.3 5.0  CL 87* 93* 96 99 99  CO2 18* 21 22 22 23   GLUCOSE 87 73 86 152* 81  BUN 39* 39* 31* 24* 17  CREATININE 2.47* 2.23* 1.60* 1.14* 0.90  CALCIUM 8.6 8.1* 8.1* 8.1* 8.2*   Liver Function Tests:  Recent Labs Lab 01/06/14 1413 01/07/14 0622 01/10/14 0555  AST 31 30 31   ALT 13 11 9   ALKPHOS 213* 180* 147*  BILITOT 0.7 0.6 1.1  PROT 7.5 6.1 6.2  ALBUMIN 2.3* 1.9* 2.7*    Recent Labs Lab 01/06/14 1413  LIPASE 20    Recent Labs Lab 01/06/14 1413 01/07/14 1736  AMMONIA 37 49   CBC:  Recent Labs Lab 01/06/14 1413 01/07/14 0622 01/08/14 0640 01/09/14 0638 01/10/14 0555  WBC 15.0* 8.8 5.5 6.5 8.0  NEUTROABS 12.1*  --   --   --  6.1  HGB 9.3* 7.9* 6.7* 9.4* 10.0*  HCT 26.3* 22.0* 19.7* 27.0* 29.7*  MCV 102.7* 101.9* 104.2* 98.5 100.7*  PLT 138* 116* 125* 124* 134*   Cardiac Enzymes: No results found for this basename: CKTOTAL, CKMB, CKMBINDEX, TROPONINI,  in the last 168 hours BNP (last 3 results)  Recent Labs  04/22/13 1047 10/30/13 1612  PROBNP 254.9* 111.9   CBG: No results found for this basename: GLUCAP,  in the last 168 hours  Recent Results (from the past 240 hour(s))  BODY FLUID CULTURE     Status: None   Collection Time    01/06/14  4:28 PM      Result Value Ref Range Status   Specimen Description ASCITIC FLUID   Final   Special Requests NONE   Final   Gram Stain     Final   Value: NO WBC SEEN     NO ORGANISMS SEEN     Performed at [facility]   Culture     Final   Value: NO GROWTH 3 DAYS     Performed at [facility]   Report Status 01/10/2014 FINAL   Final  URINE  CULTURE     Status: None   Collection Time    01/06/14  5:26 PM      Result Value Ref Range Status   Specimen Description URINE, CATHETERIZED   Final   Special Requests NONE   Final   Culture  Setup Time     Final   Value: 01/07/2014 00:32     Performed at [place]     Final   Value: 40,000 COLONIES/ML     Performed at [facility]   Culture     Final   Value: YEAST     Performed at [facility]   Report Status 01/08/2014 FINAL   Final     Studies: US Paracentesis  01/10/2014  CLINICAL DATA:  Ascites  EXAM: ULTRASOUND GUIDED  PARACENTESIS  COMPARISON:  None.  PROCEDURE: An ultrasound guided paracentesis was thoroughly discussed with the patient and questions answered. The benefits, risks, alternatives and complications were also discussed. The patient understands and wishes to proceed with the procedure. Written consent was obtained.  Ultrasound was performed to localize and mark an adequate pocket of fluid in the left lower quadrant of the abdomen. The area was then prepped and draped in the normal sterile fashion. 1% Lidocaine was used for local anesthesia. Under ultrasound guidance a 19 gauge Yueh catheter was introduced. Paracentesis was performed. The catheter was removed and a dressing applied.  Complications: None.  FINDINGS: A total of approximately 4.3 L of clear amber fluid was removed. A fluid sample was sent for laboratory analysis.  IMPRESSION: Successful ultrasound guided paracentesis yielding 4.3 L of ascites.   Electronically Signed   By: Rolm Baptise M.D.   On: 01/10/2014 15:05    Scheduled Meds: . furosemide  40 mg Oral q morning - 10a  . heparin  5,000 Units Subcutaneous 3 times per day  . lactulose  30 g Oral BID  . levothyroxine  50 mcg Oral QAC breakfast  . midodrine  5 mg Oral TID WC  . nicotine  14 mg Transdermal Daily  . nystatin cream  1 application Topical BID  . pantoprazole  40 mg Oral BID AC  . potassium  chloride  10 mEq Oral QID  . pregabalin  50 mg Oral TID  . sodium chloride  250 mL Intravenous Once  . spironolactone  100 mg Oral BID  . thiamine  100 mg Oral Daily   Continuous Infusions:    Active Problems:   Hypothyroid   ETOH abuse   Chronic pain   Hepatitis C   Hypotension, unspecified   Anasarca   Hypoalbuminemia   Chronic liver disease   Ascites   Anemia   Acute renal failure   Hyponatremia    Time spent: 32mins    MEMON,JEHANZEB  [facility] Pager [phone]. If 7PM-7AM, please contact night-coverage at [email or web address], password [facility] 01/10/2014, 9:37 PM  LOS: 4 days

## 2014-01-10 NOTE — Progress Notes (Signed)
Paracentesis complete no signs of distress. 4300 ml amber colored ascites removed.  

## 2014-01-10 NOTE — Evaluation (Signed)
Occupational Therapy Evaluation Patient Details Name: [name] MRN: [id number] DOB: 08-08-1960 Today's Date: 01/10/2014    History of Present Illness Pt is a 53 year old female with multiple chronic medical problems which have resulted in her having very limited mobility and she is now w/c dependent.  Pt is admitted with ascites secondary to cirrhosis, acute renal failure, hyponatremia and anemia.  She has chronic hypotension.  She requires assist for all ADLs  and transfers.  She has a friend who comes over to help her with this.  She lives with her husband.  She has chronic leg and back pain resulting from [facility]..   Clinical Impression   Pt is presenting to acute OT with above situation.  She has generalized weakness in BUE and has decreased ADL status due to her overall increased weakness.  Pt will benefit from continued OT services to improve strength and increase ADL status.  Recommend HHOT at this time.    Follow Up Recommendations  Home health OT    Equipment Recommendations  Tub/shower seat;Tub/shower bench (TTB is insurance will cover, otherwise a shower seat)    Recommendations for Other Services       Precautions / Restrictions Precautions Precautions: Fall Restrictions Weight Bearing Restrictions: No      Mobility Bed Mobility Overal bed mobility: Needs Assistance Bed Mobility: Supine to Sit;Sit to Supine     Supine to sit: Min assist;HOB elevated Sit to supine: Mod assist      Transfers Overall transfer level: Needs assistance Equipment used: Rolling walker (2 wheeled) Transfers: Sit to/from [facility] Sit to Stand: Mod assist Stand pivot transfers: Mod assist            Balance Overall balance assessment: Needs assistance Sitting-balance support: Feet supported;No upper extremity supported Sitting balance-Leahy Scale: Good     Standing balance support: Bilateral upper extremity supported;During functional  activity Standing balance-Leahy Scale: Fair                              ADL Overall ADL's : Needs assistance/impaired Eating/Feeding: Set up                       Toilet Transfer: Moderate assistance;BSC;RW;Stand-pivot   Toileting- Clothing Manipulation and Hygiene: Maximal assistance;Sit to/from stand Toileting - Clothing Manipulation Details (indicate cue type and reason): pt would not attempt to clean herself, would did tolerate standing during cleaning     Functional mobility during ADLs: Moderate assistance;Rolling walker       Vision                     Perception     Praxis      Pertinent Vitals/Pain Pain Assessment: 0-10 Pain Score: 10-Worst pain ever Pain Location: "Everywhere" Pain Intervention(s): Premedicated before session;Limited activity within patient's tolerance;Repositioned;Relaxation     Hand Dominance Right   Extremity/Trunk Assessment Upper Extremity Assessment Upper Extremity Assessment: Generalized weakness (Grossly 4/5 in BUE)   Lower Extremity Assessment Lower Extremity Assessment: Defer to PT evaluation       Communication Communication Communication: No difficulties   Cognition Arousal/Alertness: Awake/alert Behavior During Therapy: WFL for tasks assessed/performed Overall Cognitive Status: Within Functional Limits for tasks assessed                     General Comments       Exercises  Shoulder Instructions      Home Living Family/patient expects to be discharged to:: Private residence Living Arrangements: Spouse/significant other Available Help at Discharge: Family;Friend(s);Available 24 hours/day Type of Home: House       Home Layout: One level     Bathroom Shower/Tub: Teacher, early years/pre: Standard     Home Equipment: Wheelchair - manual;Bedside commode;Hospital bed;Grab bars - tub/shower;Walker - 4 wheels          Prior Functioning/Environment Level  of Independence: Needs assistance  Gait / Transfers Assistance Needed: pt reports typically using 4-wheeled walker at home, but having wc as needed ADL's / Homemaking Assistance Needed: Pt reports increased weakness lately, and requring increased assist with ADL needs        OT Diagnosis: Generalized weakness   OT Problem List: Decreased strength;Decreased range of motion;Decreased activity tolerance;Decreased knowledge of use of DME or AE   OT Treatment/Interventions: Self-care/ADL training;Therapeutic exercise;Energy conservation;DME and/or AE instruction;Therapeutic activities;Patient/family education    OT Goals(Current goals can be found in the care plan section) Acute Rehab OT Goals Patient Stated Goal: wants to get stronger OT Goal Formulation: With patient Time For Goal Achievement: 01/24/14 Potential to Achieve Goals: Fair ADL Goals Pt Will Transfer to Toilet: with min assist Pt Will Perform Toileting - Clothing Manipulation and hygiene: with min assist Pt/caregiver will Perform Home Exercise Program: Increased strength;Both right and left upper extremity  OT Frequency: Min 2X/week   Barriers to D/C:            Co-evaluation              End of Session Equipment Utilized During Treatment: Gait belt;Rolling walker  Activity Tolerance: Patient tolerated treatment well Patient left: in bed;with call bell/phone within reach;with bed alarm set   Time: 1014-1101 OT Time Calculation (min): 47 min Charges:  OT General Charges $OT Visit: 1 Procedure OT Evaluation $Initial OT Evaluation Tier I: 1 Procedure OT Treatments $Self Care/Home Management : 23-37 mins G-Codes:     Bea Graff, Gardner, OTR/L [phone]  01/10/2014, 12:07 PM

## 2014-01-10 NOTE — Progress Notes (Signed)
[name]  MRN: [id number]  DOB/AGE: Mar 03, 1961 53 y.o.  Primary Care Physician:BLUTH, Selinda Flavin, MD  Admit date: 01/06/2014  Chief Complaint:  Chief Complaint  Patient presents with  . Abdominal Pain    S-Pt presented on  01/06/2014 with  Chief Complaint  Patient presents with  . Abdominal Pain  .    Pt today feels better  Meds . furosemide  40 mg Oral q morning - 10a  . heparin  5,000 Units Subcutaneous 3 times per day  . lactulose  30 g Oral BID  . levothyroxine  50 mcg Oral QAC breakfast  . midodrine  5 mg Oral TID WC  . nicotine  14 mg Transdermal Daily  . nystatin cream  1 application Topical BID  . pantoprazole  40 mg Oral BID AC  . potassium chloride  10 mEq Oral QID  . pregabalin  50 mg Oral TID  . sodium chloride  250 mL Intravenous Once  . spironolactone  100 mg Oral BID  . thiamine  100 mg Oral Daily     Physical Exam: Vital signs in last 24 hours: Temp:  [98.2 F (36.8 C)-98.8 F (37.1 C)] 98.8 F (37.1 C) (09/29 0607) Pulse Rate:  [94-123] 123 (09/29 0607) Resp:  [16-20] 20 (09/29 0607) BP: (78-84)/(52-55) 78/54 mmHg (09/29 0607) SpO2:  [91 %-96 %] 91 % (09/29 0607) Weight:  [181 lb 8 oz (82.328 kg)] 181 lb 8 oz (82.328 kg) (09/29 0500) Weight change: -3 lb 12.8 oz (-1.724 kg) Last BM Date: 01/08/14  Intake/Output from previous day: 09/28 0701 - 09/29 0700 In: 720 [P.O.:720] Out: -      Physical Exam: General- pt is awake,alert, oriented to time place and person Resp- No acute REsp distress, CTA B/L NO Rhonchi CVS- S1S2 regular in rate and rhythm GIT- BS+, soft, NT, Ascites+ EXT- NO LE Edema, Cyanosis   Lab Results: CBC  Recent Labs  01/09/14 0638 01/10/14 0555  WBC 6.5 8.0  HGB 9.4* 10.0*  HCT 27.0* 29.7*  PLT 124* 134*    BMET  Recent Labs  01/09/14 0638 01/10/14 0555  NA 133* 134*  K 4.3 5.0  CL 99 99  CO2 22 23  GLUCOSE 152* 81  BUN 24* 17  CREATININE 1.14* 0.90  CALCIUM 8.1* 8.2*    Trend Creat 2015   0.49=>2.23=>1.14=>0.90  MICRO Recent Results (from the past 240 hour(s))  BODY FLUID CULTURE     Status: None   Collection Time    01/06/14  4:28 PM      Result Value Ref Range Status   Specimen Description ASCITIC FLUID   Final   Special Requests NONE   Final   Gram Stain     Final   Value: NO WBC SEEN     NO ORGANISMS SEEN     Performed at [facility]   Culture     Final   Value: NO GROWTH 2 DAYS     Performed at [facility]   Report Status PENDING   Incomplete  URINE CULTURE     Status: None   Collection Time    01/06/14  5:26 PM      Result Value Ref Range Status   Specimen Description URINE, CATHETERIZED   Final   Special Requests NONE   Final   Culture  Setup Time     Final   Value: 01/07/2014 00:32     Performed at [place]  Final   Value: 40,000 COLONIES/ML     Performed at [facility]   Culture     Final   Value: YEAST     Performed at [facility]   Report Status 01/08/2014 FINAL   Final      Lab Results  Component Value Date   CALCIUM 8.2* 01/10/2014   PHOS 4.0 04/30/2012               Impression: 1)Renal  AKI secondary to Prerenal/ATN/Hepatorenal                AKI now much better                Creat trending down, nearing baseline  2)CVS-HYpotensive but stable  3)Anemia HGb at goal (9--11) Received PRBC  4)LIver -Cirhosis AScites On Lasix, spironolactone  5)Hypothyroidsim Primary MD following  6)Electrolytes   Normokalemic  Hyponatremic    Hypervolemia Hyponatremia    Improving   7)Acid base Co2 at goal     Plan:  Will continue current care      Kristle Wesch S 01/10/2014, 8:55 AM

## 2014-01-10 NOTE — Care Management Utilization Note (Signed)
UR completed 

## 2014-01-11 ENCOUNTER — Inpatient Hospital Stay (HOSPITAL_COMMUNITY): Payer: Medicare Other

## 2014-01-11 DIAGNOSIS — K769 Liver disease, unspecified: Secondary | ICD-10-CM

## 2014-01-11 DIAGNOSIS — R7989 Other specified abnormal findings of blood chemistry: Secondary | ICD-10-CM

## 2014-01-11 LAB — COMPREHENSIVE METABOLIC PANEL
ALBUMIN: 2.5 g/dL — AB (ref 3.5–5.2)
ALT: 9 U/L (ref 0–35)
AST: 27 U/L (ref 0–37)
Alkaline Phosphatase: 134 U/L — ABNORMAL HIGH (ref 39–117)
Anion gap: 9 (ref 5–15)
BUN: 12 mg/dL (ref 6–23)
CO2: 24 mEq/L (ref 19–32)
CREATININE: 0.72 mg/dL (ref 0.50–1.10)
Calcium: 8.2 mg/dL — ABNORMAL LOW (ref 8.4–10.5)
Chloride: 100 mEq/L (ref 96–112)
GFR calc Af Amer: >90 mL/min (ref >90)
Glucose, Bld: 97 mg/dL (ref 70–99)
Potassium: 5 mEq/L (ref 3.7–5.3)
Sodium: 133 mEq/L — ABNORMAL LOW (ref 137–147)
Total Bilirubin: 1.6 mg/dL — ABNORMAL HIGH (ref 0.3–1.2)
Total Protein: 5.9 g/dL — ABNORMAL LOW (ref 6.0–8.3)

## 2014-01-11 LAB — URINALYSIS, ROUTINE W REFLEX MICROSCOPIC
BILIRUBIN URINE: NEGATIVE
Glucose, UA: NEGATIVE mg/dL
Ketones, ur: NEGATIVE mg/dL
NITRITE: POSITIVE — AB
Protein, ur: NEGATIVE mg/dL
Specific Gravity, Urine: 1.015 (ref 1.005–1.030)
UROBILINOGEN UA: 0.2 mg/dL (ref 0.0–1.0)
pH: 5.5 (ref 5.0–8.0)

## 2014-01-11 LAB — URINE MICROSCOPIC-ADD ON

## 2014-01-11 LAB — CBC
HCT: 29.6 % — ABNORMAL LOW (ref 36.0–46.0)
Hemoglobin: 10.3 g/dL — ABNORMAL LOW (ref 12.0–15.0)
MCH: 35.3 pg — AB (ref 26.0–34.0)
MCHC: 34.8 g/dL (ref 30.0–36.0)
MCV: 101.4 fL — AB (ref 78.0–100.0)
PLATELETS: 141 10*3/uL — AB (ref 150–400)
RBC: 2.92 MIL/uL — ABNORMAL LOW (ref 3.87–5.11)
RDW: 17.3 % — AB (ref 11.5–15.5)
WBC: 13.4 10*3/uL — ABNORMAL HIGH (ref 4.0–10.5)

## 2014-01-11 LAB — TSH: TSH: 7.55 u[IU]/mL — AB (ref 0.350–4.500)

## 2014-01-11 LAB — AMMONIA: AMMONIA: 62 umol/L — AB (ref 11–60)

## 2014-01-11 MED ORDER — DEXTROSE 5 % IV SOLN
2.0000 g | INTRAVENOUS | Status: DC
  Administered 2014-01-11: 2 g via INTRAVENOUS
  Filled 2014-01-11 (×2): qty 2

## 2014-01-11 MED ORDER — DEXTROSE 5 % IV SOLN
1.0000 g | INTRAVENOUS | Status: DC
  Filled 2014-01-11: qty 10

## 2014-01-11 MED ORDER — LACTULOSE 10 GM/15ML PO SOLN
30.0000 g | Freq: Three times a day (TID) | ORAL | Status: DC
  Administered 2014-01-11 – 2014-01-12 (×3): 30 g via ORAL
  Filled 2014-01-11 (×3): qty 60

## 2014-01-11 NOTE — Progress Notes (Signed)
ANTIBIOTIC CONSULT NOTE - INITIAL  Pharmacy Consult for Rocephin Indication: UTI  Allergies  Allergen Reactions  . Neurontin [Gabapentin] Anaphylaxis  . Acetaminophen Itching and Nausea And Vomiting  . Aspirin     Intolerance because of peptic ulcer  . Eggs Or Egg-Derived Products Nausea And Vomiting  . Tramadol     Nausea and vomiting  . Codeine Itching and Nausea And Vomiting    Patient is able to take morphine, demerol and fentanyl with out any type of side effects    Patient Measurements: Height: 5\' 4"  (162.6 cm) Weight: 169 lb 5 oz (76.8 kg) IBW/kg (Calculated) : 54.7  Vital Signs: Temp: 99 F (37.2 C) (09/30 1510) Temp src: Oral (09/30 1510) BP: 80/50 mmHg (09/30 1510) Pulse Rate: 80 (09/30 1510) Intake/Output from previous day: 09/29 0701 - 09/30 0700 In: 480 [P.O.:480] Out: -  Intake/Output from this shift: Total I/O In: 360 [P.O.:360] Out: 250 [Urine:250]  Labs:  Recent Labs  01/09/14 0638 01/10/14 0555 01/11/14 0658  WBC 6.5 8.0 13.4*  HGB 9.4* 10.0* 10.3*  PLT 124* 134* 141*  CREATININE 1.14* 0.90 0.72   Estimated Creatinine Clearance: 81.5 ml/min (by C-G formula based on Cr of 0.72). No results found for this basename: VANCOTROUGH, Corlis Leak, VANCORANDOM, [place], GENTPEAK, [place], [place], TOBRAPEAK, TOBRARND, AMIKACINPEAK, AMIKACINTROU, AMIKACIN,  in the last 72 hours   Microbiology: Recent Results (from the past 720 hour(s))  BODY FLUID CULTURE     Status: None   Collection Time    01/06/14  4:28 PM      Result Value Ref Range Status   Specimen Description ASCITIC FLUID   Final   Special Requests NONE   Final   Gram Stain     Final   Value: NO WBC SEEN     NO ORGANISMS SEEN     Performed at [facility]   Culture     Final   Value: NO GROWTH 3 DAYS     Performed at [facility]   Report Status 01/10/2014 FINAL   Final  URINE CULTURE     Status: None   Collection Time    01/06/14  5:26 PM      Result  Value Ref Range Status   Specimen Description URINE, CATHETERIZED   Final   Special Requests NONE   Final   Culture  Setup Time     Final   Value: 01/07/2014 00:32     Performed at [place]     Final   Value: 40,000 COLONIES/ML     Performed at [facility]   Culture     Final   Value: YEAST     Performed at [facility]   Report Status 01/08/2014 FINAL   Final  BODY FLUID CULTURE     Status: None   Collection Time    01/10/14  1:50 PM      Result Value Ref Range Status   Specimen Description ASCITIC FLUID   Final   Special Requests NONE   Final   Gram Stain     Final   Value: RARE WBC PRESENT, PREDOMINANTLY MONONUCLEAR     NO ORGANISMS SEEN     Performed at [facility]   Culture     Final   Value: NO GROWTH 1 DAY     Performed at [facility]   Report Status PENDING   Incomplete    Medical History: Past Medical  History  Diagnosis Date  . Guillain-Barre   . Blind   . Neuropathy   . Thyroid disease   . Back pain   . Arthritis   . Seizures   . UTI (lower urinary tract infection)   . Anxiety   . Depression   . DDD (degenerative disc disease), lumbar   . Hepatitis C antibody test positive     HCV RNA is negative 03/2013  . Mild diastolic dysfunction 0/34/7425  . Esophagitis, erosive 03/2014  . Hepatic steatosis 04/23/2013  . Hypomagnesemia 04/23/2013  . Cirrhosis of liver without mention of alcohol     ct on 06/06/13= Subtle nodular contour of the liver. Cirrhosis cannot be excluded.   . PUD (peptic ulcer disease)   . Tubular adenoma   . Chronic abdominal pain   . Drug-seeking behavior     per [facility] admission records 01/04/14  . Chronic hypotension   . History of ETOH abuse     Medications:  Scheduled:  . cefTRIAXone (ROCEPHIN)  IV  1 g Intravenous Q24H  . furosemide  40 mg Oral q morning - 10a  . heparin  5,000 Units Subcutaneous 3 times per day  . lactulose  30 g Oral BID  .  levothyroxine  50 mcg Oral QAC breakfast  . midodrine  5 mg Oral TID WC  . nicotine  14 mg Transdermal Daily  . nystatin cream  1 application Topical BID  . pantoprazole  40 mg Oral BID AC  . potassium chloride  10 mEq Oral QID  . pregabalin  50 mg Oral TID  . sodium chloride  250 mL Intravenous Once  . spironolactone  100 mg Oral BID  . thiamine  100 mg Oral Daily   Assessment: 3 yoF with hx cirrhosis admitted on 9/25 s/p paracentesis.  Cx data has been negative to date, but increase in WBC today.  Starting Rocephin for UTI.  Increased dose given possibility of SBP to allow for adequate penetration into ascitic fluid.   Rocephin 9/30>>  Goal of Therapy:  Eradicate infection.  Plan:  Rocephin 2gm IV q24h Monitor renal function and cx data   Biagio Borg 01/11/2014,5:06 PM

## 2014-01-11 NOTE — Progress Notes (Signed)
[name]  MRN: [id number]  DOB/AGE: 53-Feb-1962 53 y.o.  Primary Care Physician:BLUTH, Selinda Flavin, MD  Admit date: 01/06/2014  Chief Complaint:  Chief Complaint  Patient presents with  . Abdominal Pain    S-Pt presented on  01/06/2014 with  Chief Complaint  Patient presents with  . Abdominal Pain  .    Pt today feels better  Meds . furosemide  40 mg Oral q morning - 10a  . heparin  5,000 Units Subcutaneous 3 times per day  . lactulose  30 g Oral BID  . levothyroxine  50 mcg Oral QAC breakfast  . midodrine  5 mg Oral TID WC  . nicotine  14 mg Transdermal Daily  . nystatin cream  1 application Topical BID  . pantoprazole  40 mg Oral BID AC  . potassium chloride  10 mEq Oral QID  . pregabalin  50 mg Oral TID  . sodium chloride  250 mL Intravenous Once  . spironolactone  100 mg Oral BID  . thiamine  100 mg Oral Daily     Physical Exam: Vital signs in last 24 hours: Temp:  [98.5 F (36.9 C)-99.3 F (37.4 C)] 99.3 F (37.4 C) (09/30 0500) Pulse Rate:  [89-94] 91 (09/30 0500) Resp:  [18] 18 (09/30 0500) BP: (74-101)/(45-69) 74/45 mmHg (09/30 0500) SpO2:  [90 %-96 %] 93 % (09/30 0500) Weight:  [169 lb 5 oz (76.8 kg)] 169 lb 5 oz (76.8 kg) (09/30 0500) Weight change: -12 lb 3 oz (-5.528 kg) Last BM Date: 01/10/14  Intake/Output from previous day: 09/29 0701 - 09/30 0700 In: 480 [P.O.:480] Out: -      Physical Exam: General- pt is awake,alert, oriented to time place and person Resp- No acute REsp distress, CTA B/L NO Rhonchi CVS- S1S2 regular in rate and rhythm GIT- BS+, soft, NT, Ascites+ EXT- NO LE Edema, Cyanosis   Lab Results: CBC  Recent Labs  01/10/14 0555 01/11/14 0658  WBC 8.0 13.4*  HGB 10.0* 10.3*  HCT 29.7* 29.6*  PLT 134* 141*    BMET  Recent Labs  01/10/14 0555 01/11/14 0658  NA 134* 133*  K 5.0 5.0  CL 99 100  CO2 23 24  GLUCOSE 81 97  BUN 17 12  CREATININE 0.90 0.72  CALCIUM 8.2* 8.2*    Trend Creat 2015   0.49=>2.23=>1.14=>0.90=>0.72  MICRO Recent Results (from the past 240 hour(s))  BODY FLUID CULTURE     Status: None   Collection Time    01/06/14  4:28 PM      Result Value Ref Range Status   Specimen Description ASCITIC FLUID   Final   Special Requests NONE   Final   Gram Stain     Final   Value: NO WBC SEEN     NO ORGANISMS SEEN     Performed at [facility]   Culture     Final   Value: NO GROWTH 3 DAYS     Performed at [facility]   Report Status 01/10/2014 FINAL   Final  URINE CULTURE     Status: None   Collection Time    01/06/14  5:26 PM      Result Value Ref Range Status   Specimen Description URINE, CATHETERIZED   Final   Special Requests NONE   Final   Culture  Setup Time     Final   Value: 01/07/2014 00:32     Performed at [facility]  Count     Final   Value: 40,000 COLONIES/ML     Performed at [facility]   Culture     Final   Value: YEAST     Performed at [facility]   Report Status 01/08/2014 FINAL   Final  BODY FLUID CULTURE     Status: None   Collection Time    01/10/14  1:50 PM      Result Value Ref Range Status   Specimen Description ASCITIC FLUID   Final   Special Requests NONE   Final   Gram Stain     Final   Value: RARE WBC PRESENT, PREDOMINANTLY MONONUCLEAR     NO ORGANISMS SEEN     Performed at [facility]   Culture     Final   Value: NO GROWTH 1 DAY     Performed at [facility]   Report Status PENDING   Incomplete      Lab Results  Component Value Date   CALCIUM 8.2* 01/11/2014   PHOS 4.0 04/30/2012               Impression: 1)Renal  AKI secondary to Prerenal/ATN/Hepatorenal                AKI now much better                Creat trending down, nearing baseline  2)CVS-HYpotensive but stable  3)Anemia HGb at goal (9--11) Received PRBC  4)LIver -Cirhosis AScites On Lasix, spironolactone  5)Hypothyroidsim Primary MD following  6)Electrolytes    Normokalemic  Hyponatremic    Hypervolemia Hyponatremia    stable   7)Acid base Co2 at goal     Plan:  Will continue current care      Katalin Colledge S 01/11/2014, 9:00 AM

## 2014-01-11 NOTE — Plan of Care (Signed)
Problem: Food- and Nutrition-Related Knowledge Deficit (NB-1.1) Goal: Nutrition education Formal process to instruct or train a patient/client in a skill or to impart knowledge to help patients/clients voluntarily manage or modify food choices and eating behavior to maintain or improve health. Outcome: Adequate for Discharge Nutrition Education Note  RD consulted for nutrition education regarding low sodium diet.  RD provided "Low Sodium Nutrition Therapy" handout from the Academy of [employer]. Reviewed patient's dietary recall. Provided examples on ways to decrease sodium intake in diet. Discouraged intake of processed foods and use of salt shaker. Encouraged fresh fruits and vegetables as well as whole grain sources of carbohydrates to maximize fiber intake.   RD discussed why it is important for patient to adhere to diet recommendations, and emphasized the role of fluids, foods to avoid. Teach back method used.  Expect fair to good compliance.  Body mass index is 29.05 kg/(m^2). Pt meets criteria for overweight based on current BMI.  Current diet order is renal with 1200 ml fluid restriction, patient is consuming approximately 25-100% of meals at this time. Labs and medications reviewed. No further nutrition interventions warranted at this time. RD contact information provided. If additional nutrition issues arise, please re-consult RD.   Mylasia Vorhees A. Jimmye Norman, RD, LDN Pager: [phone]

## 2014-01-11 NOTE — Progress Notes (Signed)
[facility] PROGRESS NOTE  [name] YBW:[id number] DOB: 10/22/1960 DOA: 01/06/2014 PCP: Celedonio Savage, MD  Summary:  This patient presented to the hospital with complaints of abdominal pain/distention which worsened over the past 7 days prior to admission. She had left at [place] on 9/24 with aorta managing her pain adequately and were not performing a paracentesis. Her primary care physician increased her dose of Lasix from 40 to 80 mg daily. She had noticed decreased urine output for one week prior to admission. She was evaluated in the emergency room and noted to be in acute renal failure, she was hypotensive and had significant ascites. She was admitted to the hospital for further treatments. She was started on intravenous Lasix with albumin infusions, as well as intravenous fluids. She was followed by nephrology and her renal function has since returned to normal range. She underwent paracentesis during her hospital stay with removal of 4.3 L of fluid. It does not appear she has any underlying infection at this time. She is noted to be significantly hypotensive, although she does not appear septic or toxic. She's been started on midodrine. We will also check TSH and cortisol. Once her blood pressure stabilizes, then she could be discharged home with home health services.   Assessment/Plan: 1. Acute renal failure. Possibly related to intravascular volume depletion in the setting of higher dose diuretics. She was on IV hydration. Nephrology following. Monitor urine output. Renal function has normalized. 2. Cirrhosis with ascites and anasarca. Patient has hypoalbuminemia that is likely contributing to her anasarca and ascites. She received albumin infusions coupled with Lasix.. She is also on aldactone. Her cirrhosis is likely secondary to alcohol and possibly fatty liver. Hepatitis panel was positive for C. antibody. HCV RNA. ANA, anti-smooth muscle antibody and  anti mitochondrial antibodies were checked recently and found to be negative. Paracentesis done on 9/29 with removal of 4.3 L of fluid. She will followup with her gastroenterologist and it in for further workup/management. 3. Hypotension. Echo shows normal ejection fraction.c, although she does report dizziness on standing. She's been started on midodrine. Patient does not appear to be significantly symptomatic. TSH and cortisol pending. 4. Hyponatremia. Hypervolemic secondary to cirrhosis. Improved with Lasix. 5. Anemia, likely related to chronic disease with an acute component related to hemodilution. Macrocytic anemia. B12 and folate elevated.  No evidence of gross bleeding. Stool occult blood is pending. She was transfused 2 units prbc thus far during this admit. 6. Hypothyroidism. Continue synthroid 7. Chronic pain. Cont opiates cautiously at this time due to low blood pressures. 8. COPD appears stable at this time. No shortness of breath or wheezing. 9. Leukocytosis. S/p paracentesis. Afebrile with tmax of 99 overnight. Ascitic culture pending, but no organisms seen so far.  Code Status: Full Family Communication: Pt in room  Disposition Plan: Pending   Consultants:  Nephrology  Procedures:    Antibiotics:  None  HPI/Subjective: Complains of cough and mild forgetfulness.   Objective: Filed Vitals:   01/10/14 1529 01/10/14 1800 01/10/14 2216 01/11/14 0500  BP: 77/46 78/49 101/49 74/45  Pulse: 89  91 91  Temp: 98.5 F (36.9 C)  99.3 F (37.4 C) 99.3 F (37.4 C)  TempSrc: Oral  Oral Oral  Resp: 18  18 18   Height:      Weight:    76.8 kg (169 lb 5 oz)  SpO2: 90%  90% 93%    Intake/Output Summary (Last 24 hours) at 01/11/14 7681 Last data filed  at 01/11/14 0800  Gross per 24 hour  Intake    360 ml  Output      0 ml  Net    360 ml   Filed Weights   01/09/14 0436 01/10/14 0500 01/11/14 0500  Weight: 84.052 kg (185 lb 4.8 oz) 82.328 kg (181 lb 8 oz) 76.8 kg (169 lb  5 oz)    Exam:   General:  Awake, in nad  Cardiovascular: regular,s1, s2  Respiratory: normal resp effort, no wheezing  Abdomen: soft,nondistended  Musculoskeletal: perfused, no clubbing   Data Reviewed: Basic Metabolic Panel:  Recent Labs Lab 01/07/14 0622 01/08/14 0640 01/09/14 0638 01/10/14 0555 01/11/14 0658  NA 127* 128* 133* 134* 133*  K 4.9 4.8 4.3 5.0 5.0  CL 93* 96 99 99 100  CO2 21 22 22 23 24   GLUCOSE 73 86 152* 81 97  BUN 39* 31* 24* 17 12  CREATININE 2.23* 1.60* 1.14* 0.90 0.72  CALCIUM 8.1* 8.1* 8.1* 8.2* 8.2*   Liver Function Tests:  Recent Labs Lab 01/06/14 1413 01/07/14 0622 01/10/14 0555 01/11/14 0658  AST 31 30 31 27   ALT 13 11 9 9   ALKPHOS 213* 180* 147* 134*  BILITOT 0.7 0.6 1.1 1.6*  PROT 7.5 6.1 6.2 5.9*  ALBUMIN 2.3* 1.9* 2.7* 2.5*    Recent Labs Lab 01/06/14 1413  LIPASE 20    Recent Labs Lab 01/06/14 1413 01/07/14 1736  AMMONIA 37 49   CBC:  Recent Labs Lab 01/06/14 1413 01/07/14 0622 01/08/14 0640 01/09/14 0638 01/10/14 0555 01/11/14 0658  WBC 15.0* 8.8 5.5 6.5 8.0 13.4*  NEUTROABS 12.1*  --   --   --  6.1  --   HGB 9.3* 7.9* 6.7* 9.4* 10.0* 10.3*  HCT 26.3* 22.0* 19.7* 27.0* 29.7* 29.6*  MCV 102.7* 101.9* 104.2* 98.5 100.7* 101.4*  PLT 138* 116* 125* 124* 134* 141*   Cardiac Enzymes: No results found for this basename: CKTOTAL, CKMB, CKMBINDEX, TROPONINI,  in the last 168 hours BNP (last 3 results)  Recent Labs  04/22/13 1047 10/30/13 1612  PROBNP 254.9* 111.9   CBG: No results found for this basename: GLUCAP,  in the last 168 hours  Recent Results (from the past 240 hour(s))  BODY FLUID CULTURE     Status: None   Collection Time    01/06/14  4:28 PM      Result Value Ref Range Status   Specimen Description ASCITIC FLUID   Final   Special Requests NONE   Final   Gram Stain     Final   Value: NO WBC SEEN     NO ORGANISMS SEEN     Performed at [facility]   Culture     Final    Value: NO GROWTH 3 DAYS     Performed at [facility]   Report Status 01/10/2014 FINAL   Final  URINE CULTURE     Status: None   Collection Time    01/06/14  5:26 PM      Result Value Ref Range Status   Specimen Description URINE, CATHETERIZED   Final   Special Requests NONE   Final   Culture  Setup Time     Final   Value: 01/07/2014 00:32     Performed at [facility]     Final   Value: 40,000 COLONIES/ML     Performed at [place]     Final  Value: YEAST     Performed at [facility]   Report Status 01/08/2014 FINAL   Final  BODY FLUID CULTURE     Status: None   Collection Time    01/10/14  1:50 PM      Result Value Ref Range Status   Specimen Description ASCITIC FLUID   Final   Special Requests NONE   Final   Gram Stain     Final   Value: RARE WBC PRESENT, PREDOMINANTLY MONONUCLEAR     NO ORGANISMS SEEN     Performed at [facility]   Culture     Final   Value: NO GROWTH 1 DAY     Performed at [facility]   Report Status PENDING   Incomplete     Studies: US Paracentesis  01/10/2014   CLINICAL DATA:  Ascites  EXAM: ULTRASOUND GUIDED  PARACENTESIS  COMPARISON:  None.  PROCEDURE: An ultrasound guided paracentesis was thoroughly discussed with the patient and questions answered. The benefits, risks, alternatives and complications were also discussed. The patient understands and wishes to proceed with the procedure. Written consent was obtained.  Ultrasound was performed to localize and mark an adequate pocket of fluid in the left lower quadrant of the abdomen. The area was then prepped and draped in the normal sterile fashion. 1% Lidocaine was used for local anesthesia. Under ultrasound guidance a 19 gauge Yueh catheter was introduced. Paracentesis was performed. The catheter was removed and a dressing applied.  Complications: None.  FINDINGS: A total of approximately 4.3 L of clear amber fluid was  removed. A fluid sample was sent for laboratory analysis.  IMPRESSION: Successful ultrasound guided paracentesis yielding 4.3 L of ascites.   Electronically Signed   By: Rolm Baptise M.D.   On: 01/10/2014 15:05    Scheduled Meds: . furosemide  40 mg Oral q morning - 10a  . heparin  5,000 Units Subcutaneous 3 times per day  . lactulose  30 g Oral BID  . levothyroxine  50 mcg Oral QAC breakfast  . midodrine  5 mg Oral TID WC  . nicotine  14 mg Transdermal Daily  . nystatin cream  1 application Topical BID  . pantoprazole  40 mg Oral BID AC  . potassium chloride  10 mEq Oral QID  . pregabalin  50 mg Oral TID  . sodium chloride  250 mL Intravenous Once  . spironolactone  100 mg Oral BID  . thiamine  100 mg Oral Daily   Continuous Infusions:   Active Problems:   Hypothyroid   ETOH abuse   Chronic pain   Hepatitis C   Hypotension, unspecified   Anasarca   Hypoalbuminemia   Chronic liver disease   Ascites   Anemia   Acute renal failure   Hyponatremia  Time spent: 43min  [facility], [facility] Pager [phone]. If 7PM-7AM, please contact night-coverage at [email or web address], password [facility] 01/11/2014, 9:38 AM  LOS: 5 days

## 2014-01-11 NOTE — Progress Notes (Signed)
PT Cancellation Note  Patient Details Name: [name] MRN: [id number] DOB: September 22, 1960   Cancelled Treatment:    Reason Eval/Treat Not Completed: Fatigue/lethargy limiting ability to participate;attempted therapy and patient declined. When therapist entered room pt was slumped to the side with fork in hand on barely touched lunch plate. Patient awoke and was complaining she just does not feel good and can do therapy. "I am not trying to be ugly, but I just can't" Patient was made comfortable and will attempt therapy again at a later date.   Kawan [place] ATKINSO 01/11/2014, 1:35 PM

## 2014-01-11 NOTE — Care Management Note (Unsigned)
    Page 1 of 1   01/13/2014     5:01:48 PM CARE MANAGEMENT NOTE 01/13/2014  Patient:  [name], [name]   Account Number:  [id number]  Date Initiated:  01/11/2014  Documentation initiated by:  Vladimir Creeks  Subjective/Objective Assessment:   admitted with ARF, hypotention, anema, chronic pain. She is from home with spouse, and plans to return home at D/C. She uses [facility] and PT is recommending HH PT, and pt agrees     Action/Plan:   Will continue to follow for other needs   Anticipated DC Date:  01/18/2014   Anticipated DC Plan:  [place]  CM consult      [facility] Choice  HOME HEALTH   Choice offered to / List presented to:  C-1 Patient        [place] arranged  [place] PT      [place].   Status of service:  In process, will continue to follow Medicare Important Message given?  YES (If response is "NO", the following Medicare IM given date fields will be blank) Date Medicare IM given:  01/13/2014 Medicare IM given by:  Vladimir Creeks Date Additional Medicare IM given:   Additional Medicare IM given by:    Discharge Disposition:    Per UR Regulation:  Reviewed for med. necessity/level of care/duration of stay  If discussed at [place] of Stay Meetings, dates discussed:    Comments:  01/13/14 [place] RN/CM Pt now has + blood cultures - strep,  and is on IV ABX x 2 and will  need cardiology consult and possibly TEE, and 4-6 weeks of IV ABX. Will continue to follow for [facility] needs at D/C 01/11/14 1550  Vladimir Creeks RN/CM

## 2014-01-12 DIAGNOSIS — R748 Abnormal levels of other serum enzymes: Secondary | ICD-10-CM

## 2014-01-12 DIAGNOSIS — E669 Obesity, unspecified: Secondary | ICD-10-CM

## 2014-01-12 DIAGNOSIS — R7989 Other specified abnormal findings of blood chemistry: Secondary | ICD-10-CM

## 2014-01-12 DIAGNOSIS — K760 Fatty (change of) liver, not elsewhere classified: Secondary | ICD-10-CM

## 2014-01-12 DIAGNOSIS — R188 Other ascites: Secondary | ICD-10-CM

## 2014-01-12 LAB — CBC WITH DIFFERENTIAL/PLATELET
BASOS ABS: 0.1 10*3/uL (ref 0.0–0.1)
BASOS PCT: 0 % (ref 0–1)
EOS ABS: 0.1 10*3/uL (ref 0.0–0.7)
EOS PCT: 1 % (ref 0–5)
HCT: 32.9 % — ABNORMAL LOW (ref 36.0–46.0)
Hemoglobin: 10.9 g/dL — ABNORMAL LOW (ref 12.0–15.0)
LYMPHS PCT: 11 % — AB (ref 12–46)
Lymphs Abs: 1.4 10*3/uL (ref 0.7–4.0)
MCH: 34.6 pg — ABNORMAL HIGH (ref 26.0–34.0)
MCHC: 33.1 g/dL (ref 30.0–36.0)
MCV: 104.4 fL — AB (ref 78.0–100.0)
Monocytes Absolute: 1.1 10*3/uL — ABNORMAL HIGH (ref 0.1–1.0)
Monocytes Relative: 10 % (ref 3–12)
Neutro Abs: 9.3 10*3/uL — ABNORMAL HIGH (ref 1.7–7.7)
Neutrophils Relative %: 78 % — ABNORMAL HIGH (ref 43–77)
PLATELETS: 133 10*3/uL — AB (ref 150–400)
RBC: 3.15 MIL/uL — ABNORMAL LOW (ref 3.87–5.11)
RDW: 17.1 % — ABNORMAL HIGH (ref 11.5–15.5)
WBC: 11.9 10*3/uL — AB (ref 4.0–10.5)

## 2014-01-12 LAB — COMPREHENSIVE METABOLIC PANEL
ALT: 10 U/L (ref 0–35)
AST: 27 U/L (ref 0–37)
Albumin: 2.5 g/dL — ABNORMAL LOW (ref 3.5–5.2)
Alkaline Phosphatase: 136 U/L — ABNORMAL HIGH (ref 39–117)
Anion gap: 12 (ref 5–15)
BUN: 15 mg/dL (ref 6–23)
CALCIUM: 8.6 mg/dL (ref 8.4–10.5)
CO2: 24 meq/L (ref 19–32)
Chloride: 101 mEq/L (ref 96–112)
Creatinine, Ser: 1.07 mg/dL (ref 0.50–1.10)
GFR calc Af Amer: 67 mL/min — ABNORMAL LOW (ref >90)
GFR calc non Af Amer: 58 mL/min — ABNORMAL LOW (ref >90)
Glucose, Bld: 94 mg/dL (ref 70–99)
Potassium: 5.8 mEq/L — ABNORMAL HIGH (ref 3.7–5.3)
Sodium: 137 mEq/L (ref 137–147)
TOTAL PROTEIN: 6.6 g/dL (ref 6.0–8.3)
Total Bilirubin: 1 mg/dL (ref 0.3–1.2)

## 2014-01-12 LAB — HEMOGLOBIN AND HEMATOCRIT, BLOOD
HCT: 34 % — ABNORMAL LOW (ref 36.0–46.0)
HEMATOCRIT: 31.6 % — AB (ref 36.0–46.0)
HEMOGLOBIN: 10.6 g/dL — AB (ref 12.0–15.0)
Hemoglobin: 11.3 g/dL — ABNORMAL LOW (ref 12.0–15.0)

## 2014-01-12 LAB — OCCULT BLOOD X 1 CARD TO LAB, STOOL: FECAL OCCULT BLD: POSITIVE — AB

## 2014-01-12 LAB — AMMONIA: AMMONIA: 29 umol/L (ref 11–60)

## 2014-01-12 LAB — CORTISOL-AM, BLOOD: CORTISOL - AM: 16 ug/dL (ref 4.3–22.4)

## 2014-01-12 MED ORDER — VANCOMYCIN HCL 10 G IV SOLR
1500.0000 mg | Freq: Once | INTRAVENOUS | Status: AC
  Administered 2014-01-13: 1500 mg via INTRAVENOUS
  Filled 2014-01-12: qty 1500

## 2014-01-12 MED ORDER — SODIUM CHLORIDE 0.9 % IV BOLUS (SEPSIS)
250.0000 mL | Freq: Once | INTRAVENOUS | Status: AC
  Administered 2014-01-12: 250 mL via INTRAVENOUS

## 2014-01-12 MED ORDER — CEFUROXIME AXETIL 250 MG PO TABS
250.0000 mg | ORAL_TABLET | Freq: Two times a day (BID) | ORAL | Status: DC
  Administered 2014-01-12: 250 mg via ORAL
  Filled 2014-01-12: qty 1

## 2014-01-12 MED ORDER — LACTULOSE 10 GM/15ML PO SOLN
30.0000 g | Freq: Two times a day (BID) | ORAL | Status: DC
  Administered 2014-01-13 (×2): 30 g via ORAL
  Filled 2014-01-12 (×2): qty 60

## 2014-01-12 MED ORDER — VANCOMYCIN HCL IN DEXTROSE 1-5 GM/200ML-% IV SOLN
1000.0000 mg | Freq: Two times a day (BID) | INTRAVENOUS | Status: DC
  Administered 2014-01-13: 1000 mg via INTRAVENOUS
  Filled 2014-01-12: qty 200

## 2014-01-12 NOTE — Progress Notes (Addendum)
Subjective: Interval History: Feels week when start to get up and walk other feels ok  Objective: Vital signs in last 24 hours: Temp:  [98.8 F (37.1 C)-99.4 F (37.4 C)] 98.8 F (37.1 C) (10/01 0634) Pulse Rate:  [80-97] 88 (10/01 0634) Resp:  [18-20] 20 (10/01 0634) BP: (75-89)/(50-53) 75/52 mmHg (10/01 0634) SpO2:  [92 %-95 %] 95 % (10/01 0634) Weight:  [76.068 kg (167 lb 11.2 oz)] 76.068 kg (167 lb 11.2 oz) (10/01 0634) Weight change: -0.732 kg (-1 lb 9.8 oz)  Intake/Output from previous day: 09/30 0701 - 10/01 0700 In: 650 [P.O.:600; IV Piggyback:50] Out: 250 [Urine:250] Intake/Output this shift: Total I/O In: 240 [P.O.:240] Out: -   Generally she is alert and in no apparent distress Chest decrease breath sound bilaterally. No wheezing Heart no murumer Abdomen: distended but none tender Extremities no edema  Lab Results:  Recent Labs  01/11/14 0658 01/12/14 0625  WBC 13.4* 11.9*  HGB 10.3* 10.9*  HCT 29.6* 32.9*  PLT 141* 133*   BMET:   Recent Labs  01/11/14 0658 01/12/14 0625  NA 133* 137  K 5.0 5.8*  CL 100 101  CO2 24 24  GLUCOSE 97 94  BUN 12 15  CREATININE 0.72 1.07  CALCIUM 8.2* 8.6   No results found for this basename: PTH,  in the last 72 hours Iron Studies: No results found for this basename: IRON, TIBC, TRANSFERRIN, FERRITIN,  in the last 72 hours  Studies/Results: US Paracentesis  01/10/2014   CLINICAL DATA:  Ascites  EXAM: ULTRASOUND GUIDED  PARACENTESIS  COMPARISON:  None.  PROCEDURE: An ultrasound guided paracentesis was thoroughly discussed with the patient and questions answered. The benefits, risks, alternatives and complications were also discussed. The patient understands and wishes to proceed with the procedure. Written consent was obtained.  Ultrasound was performed to localize and mark an adequate pocket of fluid in the left lower quadrant of the abdomen. The area was then prepped and draped in the normal sterile fashion. 1%  Lidocaine was used for local anesthesia. Under ultrasound guidance a 19 gauge Yueh catheter was introduced. Paracentesis was performed. The catheter was removed and a dressing applied.  Complications: None.  FINDINGS: A total of approximately 4.3 L of clear amber fluid was removed. A fluid sample was sent for laboratory analysis.  IMPRESSION: Successful ultrasound guided paracentesis yielding 4.3 L of ascites.   Electronically Signed   By: Rolm Baptise M.D.   On: 01/10/2014 15:05   Dg Chest Port 1 View  01/11/2014   CLINICAL DATA:  Cough and elevated white blood cell count  EXAM: PORTABLE CHEST - 1 VIEW  COMPARISON:  January 06, 2014  FINDINGS: Lungs are clear. Heart is upper normal in size with pulmonary vascularity within normal limits. No adenopathy. No bone lesions.  IMPRESSION: No edema or consolidation.   Electronically Signed   By: Lowella Grip M.D.   On: 01/11/2014 14:34    I have reviewed the patient's current medications.  Assessment/Plan: Problem#1 AKI: Her renal function has recovered Problem#2 Liver cirrhosis Problem#3 Hyponatremia: Hypervolemic hyponatremia her sodium is 137 has corrected Problem#4 Anemia: Her hemoglobin is stable Problem#5 Hypothyroidism Problem#6 Ascites: Problem#7 Hyperkalemia: Potassium is high possibly a combination of high potassium intake and Aldactone Plan:d/c Aldactone Since her renal  function has recovered I will sign off . Thank you. Basic metabolic panel in am  LOS: 6 days   [name] S 01/12/2014,12:01 PM

## 2014-01-12 NOTE — Progress Notes (Signed)
Bright red blood noted in pt's stool. Sample for heme occult taken per protocol. Dr. Wyline Copas notified.

## 2014-01-12 NOTE — Progress Notes (Signed)
Physical Therapy Treatment Patient Details Name: [name] MRN: [id number] DOB: [birth year] Today's Date: 01/12/2014    History of Present Illness Pt is a 53 year old female with multiple chronic medical problems which have resulted in her having very limited mobility and she is now w/c dependent.  Pt is admitted with ascites secondary to cirrhosis, acute renal failure, hyponatremia and anemia.  She has chronic hypotension.  She requires assist for all ADLs  and transfers.  She has a friend who comes over to help her with this.  She lives with her husband.  She has chronic leg and back pain resulting from [facility]    PT Comments    Pt is reporting feeling a bit better today and said that the doctors are giving her "good news".  She did c/o severe generalized pain and was anxious to receive pain med.  Per RN, she could not have it until 12:30.  She did participate in mild bed exercise but did fall asleep at times and needed to be stimulated.  She required mod assist to transfer to edge of bed and had good sitting balance.  Max assist was needed for her to transfer sit to stand but then she was able to use the walker to pivot bed to [facility].  I am concerned that with continued hospitalization she will get even weaker and need SNF but pt clearly states that she has full time help and will be fine at home.  Follow Up Recommendations  Home health PT     Equipment Recommendations  None recommended by PT    Recommendations for Other Services  OT     Precautions / Restrictions Precautions Precautions: Fall Restrictions Weight Bearing Restrictions: No    Mobility  Bed Mobility Overal bed mobility: Needs Assistance Bed Mobility: Supine to Sit     Supine to sit: Mod assist        Transfers Overall transfer level: Needs assistance Equipment used: Rolling walker (2 wheeled) Transfers: Sit to/from [facility] Sit to Stand: Max assist Stand pivot transfers: Mod  assist       General transfer comment: pt transferred from bed to [facility] and [facility] to a chair using a walker...transfer was very laborious for her and she was somewhat unstable with the trunk maximally flexed over the walker  Ambulation/Gait                 Stairs            Wheelchair Mobility    Modified Rankin (Stroke Patients Only)       Balance Overall balance assessment: Needs assistance Sitting-balance support: No upper extremity supported;Feet supported Sitting balance-Leahy Scale: Good     Standing balance support: Bilateral upper extremity supported Standing balance-Leahy Scale: Fair                      Cognition Arousal/Alertness: Awake/alert Behavior During Therapy: WFL for tasks assessed/performed Overall Cognitive Status: Within Functional Limits for tasks assessed                      Exercises General Exercises - Lower Extremity Ankle Circles/Pumps: AROM;Both;10 reps;Supine Quad Sets: AROM;Both;10 reps;Supine Gluteal Sets: AROM;Both;10 reps;Supine Short Arc Quad: AROM;Both;10 reps;Supine Heel Slides: AAROM;Both;10 reps;Supine Hip ABduction/ADduction: AAROM;Both;10 reps;Supine    General Comments        Pertinent Vitals/Pain Pain Assessment: 0-10 Pain Score: 9  Pain Location: all over Pain Descriptors / Indicators: Aching  Pain Intervention(s): Premedicated before session;Repositioned;Limited activity within patient's tolerance    Home Living                      Prior Function            PT Goals (current goals can now be found in the care plan section) Progress towards PT goals: Progressing toward goals    Frequency       PT Plan Current plan remains appropriate    Co-evaluation             End of Session Equipment Utilized During Treatment: Gait belt Activity Tolerance: Patient limited by fatigue Patient left: in chair;with call bell/phone within reach;with chair alarm set     Time:  2426-8341 PT Time Calculation (min): 59 min  Charges:  $Therapeutic Exercise: 8-22 mins $Therapeutic Activity: 8-22 mins                    G Codes:      Sable Feil January 26, 2014, 2:11 PM

## 2014-01-12 NOTE — Progress Notes (Signed)
BP 80/54. MD paged and orders given for NS 250cc bolus and to hold IV Dilaudid. Will monitor BP.

## 2014-01-12 NOTE — Progress Notes (Signed)
[facility] PROGRESS NOTE  [name] BWL:[id number] DOB: Sep 21, 1960 DOA: 01/06/2014 PCP: Celedonio Savage, MD  Summary:  This patient presented to the hospital with complaints of abdominal pain/distention which worsened over the past 7 days prior to admission. She had left at [place] on 9/24 with aorta managing her pain adequately and were not performing a paracentesis. Her primary care physician increased her dose of Lasix from 40 to 80 mg daily. She had noticed decreased urine output for one week prior to admission. She was evaluated in the emergency room and noted to be in acute renal failure, she was hypotensive and had significant ascites. She was admitted to the hospital for further treatments. She was started on intravenous Lasix with albumin infusions, as well as intravenous fluids. She was followed by nephrology and her renal function has since returned to normal range. She underwent paracentesis during her hospital stay with removal of 4.3 L of fluid. It does not appear she has any underlying infection at this time. She is noted to be significantly hypotensive, although she does not appear septic or toxic. She's been started on midodrine. We will also check TSH and cortisol. Once her blood pressure stabilizes, then she could be discharged home with home health services.   Assessment/Plan: 1. Acute renal failure. Possibly related to intravascular volume depletion in the setting of higher dose diuretics. She was on IV hydration. Nephrology following. Monitor urine output. Renal function has normalized. 2. Cirrhosis with ascites and anasarca. Patient has hypoalbuminemia that is likely contributing to her anasarca and ascites. She received albumin infusions coupled with Lasix.. She is also on aldactone. Her cirrhosis is likely secondary to alcohol and possibly fatty liver. Hepatitis panel was positive for C. antibody. HCV RNA. ANA, anti-smooth muscle antibody and  anti mitochondrial antibodies were checked recently and found to be negative. Paracentesis done on 9/29 with removal of 4.3 L of fluid. She will followup with her gastroenterologist and it in for further workup/management. 3. Hypotension. Echo shows normal ejection fraction.c, although she does report dizziness on standing. She's been started on midodrine. Patient does not appear to be significantly symptomatic. TSH and cortisol pending. 4. Hyponatremia. Hypervolemic secondary to cirrhosis. Improved with Lasix. 5. Anemia, likely related to chronic disease with an acute component related to hemodilution. Macrocytic anemia. B12 and folate elevated.  No evidence of gross bleeding. Stool occult blood is pending. She was transfused 2 units prbc thus far during this admit. 6. Hypothyroidism. Continue synthroid 7. Chronic pain. Cont opiates cautiously at this time due to low blood pressures. 8. COPD appears stable at this time. No shortness of breath or wheezing. 9. Encephalopathy. Likely secondary to elevated ammonia with concurrent UTI (see below). On abx. Have increased lactulose dose with resultant normalization of ammonia (62->29) 10. UTI without sepsis. Urine cx pending. On empiric rocephin. Leukocytosis improving. Follow urine cx results.  Code Status: Full Family Communication: Pt in room Disposition Plan: Pending   Consultants:  Nephrology  Procedures:    Antibiotics:  None  HPI/Subjective: Feels better. Eager to go home   Objective: Filed Vitals:   01/11/14 0500 01/11/14 1510 01/11/14 2143 01/12/14 0634  BP: 74/45 80/50 89/53  75/52  Pulse: 91 80 97 88  Temp: 99.3 F (37.4 C) 99 F (37.2 C) 99.4 F (37.4 C) 98.8 F (37.1 C)  TempSrc: Oral Oral Oral Oral  Resp: 18 18 20 20   Height:      Weight: 76.8 kg (169 lb 5 oz)  76.068 kg (167 lb 11.2 oz)  SpO2: 93% 92% 93% 95%    Intake/Output Summary (Last 24 hours) at 01/12/14 1004 Last data filed at 01/11/14 2145  Gross per  24 hour  Intake    530 ml  Output    250 ml  Net    280 ml   Filed Weights   01/10/14 0500 01/11/14 0500 01/12/14 0634  Weight: 82.328 kg (181 lb 8 oz) 76.8 kg (169 lb 5 oz) 76.068 kg (167 lb 11.2 oz)    Exam:   General:  Awake, in nad  Cardiovascular: regular,s1, s2  Respiratory: normal resp effort, no wheezing  Abdomen: soft,nondistended  Musculoskeletal: perfused, no clubbing   Data Reviewed: Basic Metabolic Panel:  Recent Labs Lab 01/08/14 0640 01/09/14 0638 01/10/14 0555 01/11/14 0658 01/12/14 0625  NA 128* 133* 134* 133* 137  K 4.8 4.3 5.0 5.0 5.8*  CL 96 99 99 100 101  CO2 22 22 23 24 24   GLUCOSE 86 152* 81 97 94  BUN 31* 24* 17 12 15   CREATININE 1.60* 1.14* 0.90 0.72 1.07  CALCIUM 8.1* 8.1* 8.2* 8.2* 8.6   Liver Function Tests:  Recent Labs Lab 01/06/14 1413 01/07/14 0622 01/10/14 0555 01/11/14 0658 01/12/14 0625  AST 31 30 31 27 27   ALT 13 11 9 9 10   ALKPHOS 213* 180* 147* 134* 136*  BILITOT 0.7 0.6 1.1 1.6* 1.0  PROT 7.5 6.1 6.2 5.9* 6.6  ALBUMIN 2.3* 1.9* 2.7* 2.5* 2.5*    Recent Labs Lab 01/06/14 1413  LIPASE 20    Recent Labs Lab 01/06/14 1413 01/07/14 1736 01/11/14 1345 01/12/14 0627  AMMONIA 37 49 62* 29   CBC:  Recent Labs Lab 01/06/14 1413  01/08/14 0640 01/09/14 0638 01/10/14 0555 01/11/14 0658 01/12/14 0625  WBC 15.0*  < > 5.5 6.5 8.0 13.4* 11.9*  NEUTROABS 12.1*  --   --   --  6.1  --  9.3*  HGB 9.3*  < > 6.7* 9.4* 10.0* 10.3* 10.9*  HCT 26.3*  < > 19.7* 27.0* 29.7* 29.6* 32.9*  MCV 102.7*  < > 104.2* 98.5 100.7* 101.4* 104.4*  PLT 138*  < > 125* 124* 134* 141* 133*  < > = values in this interval not displayed. Cardiac Enzymes: No results found for this basename: CKTOTAL, CKMB, CKMBINDEX, TROPONINI,  in the last 168 hours BNP (last 3 results)  Recent Labs  04/22/13 1047 10/30/13 1612  PROBNP 254.9* 111.9   CBG: No results found for this basename: GLUCAP,  in the last 168 hours  Recent Results  (from the past 240 hour(s))  BODY FLUID CULTURE     Status: None   Collection Time    01/06/14  4:28 PM      Result Value Ref Range Status   Specimen Description ASCITIC FLUID   Final   Special Requests NONE   Final   Gram Stain     Final   Value: NO WBC SEEN     NO ORGANISMS SEEN     Performed at [facility]   Culture     Final   Value: NO GROWTH 3 DAYS     Performed at [facility]   Report Status 01/10/2014 FINAL   Final  URINE CULTURE     Status: None   Collection Time    01/06/14  5:26 PM      Result Value Ref Range Status   Specimen Description URINE, CATHETERIZED   Final  Special Requests NONE   Final   Culture  Setup Time     Final   Value: 01/07/2014 00:32     Performed at [facility] Count     Final   Value: 40,000 COLONIES/ML     Performed at [facility]   Culture     Final   Value: YEAST     Performed at [facility]   Report Status 01/08/2014 FINAL   Final  BODY FLUID CULTURE     Status: None   Collection Time    01/10/14  1:50 PM      Result Value Ref Range Status   Specimen Description ASCITIC FLUID   Final   Special Requests NONE   Final   Gram Stain     Final   Value: RARE WBC PRESENT, PREDOMINANTLY MONONUCLEAR     NO ORGANISMS SEEN     Performed at [facility]   Culture     Final   Value: NO GROWTH 1 DAY     Performed at [facility]   Report Status PENDING   Incomplete  CULTURE, BLOOD (ROUTINE X 2)     Status: None   Collection Time    01/11/14 11:41 AM      Result Value Ref Range Status   Specimen Description BLOOD LEFT HAND   Final   Special Requests BOTTLES DRAWN AEROBIC AND ANAEROBIC 8CC EACH   Final   Culture PENDING   Incomplete   Report Status PENDING   Incomplete  CULTURE, BLOOD (ROUTINE X 2)     Status: None   Collection Time    01/11/14 11:46 AM      Result Value Ref Range Status   Specimen Description BLOOD LEFT HAND   Final   Special Requests BOTTLES DRAWN  AEROBIC AND ANAEROBIC 8CC EACH   Final   Culture PENDING   Incomplete   Report Status PENDING   Incomplete     Studies: US Paracentesis  01/10/2014   CLINICAL DATA:  Ascites  EXAM: ULTRASOUND GUIDED  PARACENTESIS  COMPARISON:  None.  PROCEDURE: An ultrasound guided paracentesis was thoroughly discussed with the patient and questions answered. The benefits, risks, alternatives and complications were also discussed. The patient understands and wishes to proceed with the procedure. Written consent was obtained.  Ultrasound was performed to localize and mark an adequate pocket of fluid in the left lower quadrant of the abdomen. The area was then prepped and draped in the normal sterile fashion. 1% Lidocaine was used for local anesthesia. Under ultrasound guidance a 19 gauge Yueh catheter was introduced. Paracentesis was performed. The catheter was removed and a dressing applied.  Complications: None.  FINDINGS: A total of approximately 4.3 L of clear amber fluid was removed. A fluid sample was sent for laboratory analysis.  IMPRESSION: Successful ultrasound guided paracentesis yielding 4.3 L of ascites.   Electronically Signed   By: Rolm Baptise M.D.   On: 01/10/2014 15:05   Dg Chest Port 1 View  01/11/2014   CLINICAL DATA:  Cough and elevated white blood cell count  EXAM: PORTABLE CHEST - 1 VIEW  COMPARISON:  January 06, 2014  FINDINGS: Lungs are clear. Heart is upper normal in size with pulmonary vascularity within normal limits. No adenopathy. No bone lesions.  IMPRESSION: No edema or consolidation.   Electronically Signed   By: Lowella Grip M.D.   On: 01/11/2014 14:34    Scheduled Meds: .  cefTRIAXone (ROCEPHIN)  IV  2 g Intravenous Q24H  . furosemide  40 mg Oral q morning - 10a  . heparin  5,000 Units Subcutaneous 3 times per day  . lactulose  30 g Oral TID  . levothyroxine  50 mcg Oral QAC breakfast  . midodrine  5 mg Oral TID WC  . nicotine  14 mg Transdermal Daily  . nystatin cream  1  application Topical BID  . pantoprazole  40 mg Oral BID AC  . pregabalin  50 mg Oral TID  . sodium chloride  250 mL Intravenous Once  . spironolactone  100 mg Oral BID  . thiamine  100 mg Oral Daily   Continuous Infusions:   Active Problems:   Hypothyroid   ETOH abuse   Chronic pain   Hepatitis C   Hypotension, unspecified   Anasarca   Hypoalbuminemia   Chronic liver disease   Ascites   Anemia   Acute renal failure   Hyponatremia  Time spent: 63min  [facility], [facility] Pager [phone]. If 7PM-7AM, please contact night-coverage at [email or web address], password [facility] 01/12/2014, 10:04 AM  LOS: 6 days

## 2014-01-12 NOTE — Progress Notes (Signed)
ANTIBIOTIC CONSULT NOTE - INITIAL  Pharmacy Consult for Vancomycin Indication: rule out sepsis  Allergies  Allergen Reactions  . Neurontin [Gabapentin] Anaphylaxis  . Acetaminophen Itching and Nausea And Vomiting  . Aspirin     Intolerance because of peptic ulcer  . Eggs Or Egg-Derived Products Nausea And Vomiting  . Tramadol     Nausea and vomiting  . Codeine Itching and Nausea And Vomiting    Patient is able to take morphine, demerol and fentanyl with out any type of side effects    Patient Measurements: Height: 5\' 4"  (162.6 cm) Weight: 167 lb 11.2 oz (76.068 kg) IBW/kg (Calculated) : 54.7  Vital Signs: Temp: 98 F (36.7 C) (10/01 1237) Temp Source: Oral (10/01 1237) BP: 70/47 mmHg (10/01 1237) Pulse Rate: 95 (10/01 1237) Intake/Output from previous day: 09/30 0701 - 10/01 0700 In: 650 [P.O.:600; IV Piggyback:50] Out: 250 [Urine:250] Intake/Output from this shift:    Labs:  Recent Labs  01/10/14 0555 01/11/14 0658 01/12/14 0625 01/12/14 1235 01/12/14 1750  WBC 8.0 13.4* 11.9*  --   --   HGB 10.0* 10.3* 10.9* 11.3* 10.6*  PLT 134* 141* 133*  --   --   CREATININE 0.90 0.72 1.07  --   --    Estimated Creatinine Clearance: 60.8 ml/min (by C-G formula based on Cr of 1.07). No results found for this basename: VANCOTROUGH, Corlis Leak, VANCORANDOM, [place], [place], [place], [place], TOBRAPEAK, TOBRARND, AMIKACINPEAK, AMIKACINTROU, AMIKACIN,  in the last 72 hours   Microbiology: Recent Results (from the past 720 hour(s))  BODY FLUID CULTURE     Status: None   Collection Time    01/06/14  4:28 PM      Result Value Ref Range Status   Specimen Description ASCITIC FLUID   Final   Special Requests NONE   Final   Gram Stain     Final   Value: NO WBC SEEN     NO ORGANISMS SEEN     Performed at [facility]   Culture     Final   Value: NO GROWTH 3 DAYS     Performed at [facility]   Report Status 01/10/2014 FINAL   Final  URINE CULTURE      Status: None   Collection Time    01/06/14  5:26 PM      Result Value Ref Range Status   Specimen Description URINE, CATHETERIZED   Final   Special Requests NONE   Final   Culture  Setup Time     Final   Value: 01/07/2014 00:32     Performed at [facility]     Final   Value: 40,000 COLONIES/ML     Performed at [facility]   Culture     Final   Value: YEAST     Performed at [facility]   Report Status 01/08/2014 FINAL   Final  BODY FLUID CULTURE     Status: None   Collection Time    01/10/14  1:50 PM      Result Value Ref Range Status   Specimen Description ASCITIC FLUID   Final   Special Requests NONE   Final   Gram Stain     Final   Value: RARE WBC PRESENT, PREDOMINANTLY MONONUCLEAR     NO ORGANISMS SEEN     Performed at [facility]   Culture     Final   Value: NO GROWTH 2 DAYS  Performed at [facility]   Report Status PENDING   Incomplete  CULTURE, BLOOD (ROUTINE X 2)     Status: None   Collection Time    01/11/14 11:41 AM      Result Value Ref Range Status   Specimen Description BLOOD LEFT HAND   Final   Special Requests BOTTLES DRAWN AEROBIC AND ANAEROBIC 8CC EACH   Final   Culture NO GROWTH 1 DAY   Final   Report Status PENDING   Incomplete  CULTURE, BLOOD (ROUTINE X 2)     Status: None   Collection Time    01/11/14 11:46 AM      Result Value Ref Range Status   Specimen Description BLOOD LEFT HAND   Final   Special Requests BOTTLES DRAWN AEROBIC AND ANAEROBIC 8CC EACH   Final   Culture     Final   Value: GRAM POSITIVE RODS     Gram Stain Report Called to,Read Back By and Verified With: THOMAS,K AT 2145 ON 01/12/2014 BY ISLEY,B    Report Status PENDING   Incomplete  URINE CULTURE     Status: None   Collection Time    01/11/14  3:55 PM      Result Value Ref Range Status   Specimen Description URINE, CATHETERIZED   Final   Special Requests NONE   Final   Culture  Setup Time     Final   Value:  01/11/2014 18:50     Performed at [place] PENDING   Incomplete   Culture     Final   Value: Culture reincubated for better growth     Performed at [facility]   Report Status PENDING   Incomplete    Medical History: Past Medical History  Diagnosis Date  . Guillain-Barre   . Blind   . Neuropathy   . Thyroid disease   . Back pain   . Arthritis   . Seizures   . UTI (lower urinary tract infection)   . Anxiety   . Depression   . DDD (degenerative disc disease), lumbar   . Hepatitis C antibody test positive     HCV RNA is negative 03/2013  . Mild diastolic dysfunction 2/75/1700  . Esophagitis, erosive 03/2014  . Hepatic steatosis 04/23/2013  . Hypomagnesemia 04/23/2013  . Cirrhosis of liver without mention of alcohol     ct on 06/06/13= Subtle nodular contour of the liver. Cirrhosis cannot be excluded.   . PUD (peptic ulcer disease)   . Tubular adenoma   . Chronic abdominal pain   . Drug-seeking behavior     per [facility] admission records 01/04/14  . Chronic hypotension   . History of ETOH abuse    Anti-infectives   Start     Dose/Rate Route Frequency Ordered Stop   01/13/14 1000  vancomycin (VANCOCIN) IVPB 1000 mg/200 mL premix     1,000 mg 200 mL/hr over 60 Minutes Intravenous Every 12 hours 01/12/14 2217     01/12/14 2300  vancomycin (VANCOCIN) 1,500 mg in sodium chloride 0.9 % 500 mL IVPB     1,500 mg 250 mL/hr over 120 Minutes Intravenous  Once 01/12/14 2216     01/12/14 1700  cefUROXime (CEFTIN) tablet 250 mg  Status:  Discontinued     250 mg Oral 2 times daily with meals 01/12/14 1253 01/12/14 2215   01/11/14 1800  cefTRIAXone (ROCEPHIN) 1 g in dextrose 5 % 50 mL IVPB  Status:  Discontinued     1 g 100 mL/hr over 30 Minutes Intravenous Every 24 hours 01/11/14 1703 01/11/14 1709   01/11/14 1800  cefTRIAXone (ROCEPHIN) 2 g in dextrose 5 % 50 mL IVPB  Status:  Discontinued     2 g 100 mL/hr over 30 Minutes Intravenous Every  24 hours 01/11/14 1709 01/12/14 1253      Assessment: Afebrile.  Good renal fxn.  Estimated Creatinine Clearance: 60.8 ml/min (by C-G formula based on Cr of 1.07). Asked to initiate Vancomycin for suspected sepsis. 1/2 blood cx with [facility]  Goal of Therapy:  Vancomycin trough level 15-20 mcg/ml  Plan:  Vancomycin 1500mg  tonight x 1 then Vancomycin 1000mg  IV q12hrs Check trough at steady state Monitor labs, renal fxn, and cultures  Hart Robinsons A 01/12/2014,10:17 PM

## 2014-01-13 ENCOUNTER — Encounter (HOSPITAL_COMMUNITY): Payer: Self-pay | Admitting: Gastroenterology

## 2014-01-13 DIAGNOSIS — D62 Acute posthemorrhagic anemia: Secondary | ICD-10-CM

## 2014-01-13 DIAGNOSIS — K7031 Alcoholic cirrhosis of liver with ascites: Principal | ICD-10-CM

## 2014-01-13 DIAGNOSIS — G610 Guillain-Barre syndrome: Secondary | ICD-10-CM

## 2014-01-13 LAB — CBC
HCT: 27.8 % — ABNORMAL LOW (ref 36.0–46.0)
Hemoglobin: 9.3 g/dL — ABNORMAL LOW (ref 12.0–15.0)
MCH: 34.6 pg — AB (ref 26.0–34.0)
MCHC: 33.5 g/dL (ref 30.0–36.0)
MCV: 103.3 fL — ABNORMAL HIGH (ref 78.0–100.0)
Platelets: 139 10*3/uL — ABNORMAL LOW (ref 150–400)
RBC: 2.69 MIL/uL — ABNORMAL LOW (ref 3.87–5.11)
RDW: 16.8 % — AB (ref 11.5–15.5)
WBC: 9.9 10*3/uL (ref 4.0–10.5)

## 2014-01-13 LAB — BASIC METABOLIC PANEL
Anion gap: 12 (ref 5–15)
BUN: 16 mg/dL (ref 6–23)
CO2: 21 mEq/L (ref 19–32)
CREATININE: 1.14 mg/dL — AB (ref 0.50–1.10)
Calcium: 7.6 mg/dL — ABNORMAL LOW (ref 8.4–10.5)
Chloride: 103 mEq/L (ref 96–112)
GFR calc non Af Amer: 54 mL/min — ABNORMAL LOW (ref >90)
GFR, EST AFRICAN AMERICAN: 62 mL/min — AB (ref >90)
Glucose, Bld: 106 mg/dL — ABNORMAL HIGH (ref 70–99)
Potassium: 4.1 mEq/L (ref 3.7–5.3)
Sodium: 136 mEq/L — ABNORMAL LOW (ref 137–147)

## 2014-01-13 MED ORDER — HYDROCORTISONE ACETATE 25 MG RE SUPP: 25.0000 mg | Freq: Two times a day (BID) | RECTAL | Status: DC

## 2014-01-13 MED ORDER — MORPHINE SULFATE 2 MG/ML IJ SOLN
1.0000 mg | INTRAMUSCULAR | Status: AC
  Administered 2014-01-13: 1 mg via INTRAVENOUS
  Filled 2014-01-13: qty 1

## 2014-01-13 MED ORDER — VANCOMYCIN HCL IN DEXTROSE 1-5 GM/200ML-% IV SOLN
1000.0000 mg | Freq: Two times a day (BID) | INTRAVENOUS | Status: DC
  Administered 2014-01-13 – 2014-01-14 (×2): 1000 mg via INTRAVENOUS
  Filled 2014-01-13 (×6): qty 200

## 2014-01-13 MED ORDER — MIDODRINE HCL 5 MG PO TABS: 5.0000 mg | ORAL_TABLET | Freq: Three times a day (TID) | ORAL | Status: DC

## 2014-01-13 MED ORDER — OXYCODONE HCL 5 MG PO TABS: 5.0000 mg | ORAL_TABLET | ORAL | Status: DC | PRN

## 2014-01-13 MED ORDER — DEXTROSE 5 % IV SOLN
1.0000 g | INTRAVENOUS | Status: DC
  Administered 2014-01-13: 1 g via INTRAVENOUS
  Filled 2014-01-13 (×3): qty 10

## 2014-01-13 MED ORDER — HYDROCORTISONE ACETATE 25 MG RE SUPP
25.0000 mg | Freq: Two times a day (BID) | RECTAL | Status: DC
  Administered 2014-01-13 – 2014-01-14 (×2): 25 mg via RECTAL
  Filled 2014-01-13 (×6): qty 1

## 2014-01-13 MED ORDER — FUROSEMIDE 40 MG PO TABS: 40.0000 mg | ORAL_TABLET | Freq: Every morning | ORAL | Status: AC

## 2014-01-13 NOTE — Progress Notes (Signed)
Phineas Real, RN notified nurse about pts BP 80/45. Notified [facility]. Pt asymptomatic at this time. Pt resting comfortably in bed at lowest position & call bell is within reach. Will continue to monitor & wait to hear from physician.

## 2014-01-13 NOTE — Consult Note (Signed)
Referring Provider: Dr. Wyline Copas Primary Care Physician:  Celedonio Savage, MD Primary Gastroenterologist:  Previously Dr. Gala Romney. Discharged from [facility] in July 2015.    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 She has actually been discharged from our practice due to non-compliance in July 2015. Presented this admission with acute renal failure, decompensated cirrhosis, encephalopathy. Paracentesis on 9/29 with 4.3 liters removed. Fluid analysis negative. No evidence for SBP. Upon review of notes, appears she was seen at [facility] on 9/24 but left AMA. Per documentation, Lasix had been increased from 40 mg to 80 mg daily. Decreased urine output prior to admission. Has received runs of Albumin infusions with IV lasix. Nephrology on board with improvement in renal function. Started on midodrine due to hypotension.   GI consulted due to rectal bleeding, worsening anemia. 2 units PRBCs received this admission. Hgb down to 9.3 today; appears historically ranges anywhere from 8-11. Bright red blood per rectum in stool yesterday. Patient is unaware of this. Had BM this morning without blood per RN. Complains of chronic pain. Chronic underlying nausea. No vomiting. No reflux exacerbations. Denies diarrhea. Denies confusion or mental status changes. 4 bowel movements yesterday, 2 thus far today. On lactulose. Denies dysphagia, chest pain, shortness of breath. Feels fatigued.    Last GI evaluation by Dr. Gala Romney: EGD with erosive reflux esophagitis-much improved over that seen previously. Hiatal hernia. Previously noted gastric ulcer completely healed.  Portal gastropathy. Colonoscopy revealed rectal varices/anal canal hemorrhoids. Portal colopathy.Colonic diverticulosis. Colonic  polyp--tubular adenoma.   Past Medical History  Diagnosis Date  . Guillain-Barre   . Blind   . Neuropathy   . Thyroid disease   . Back pain   . Arthritis   . Seizures   . UTI (lower urinary tract infection)   . Anxiety   . Depression   . DDD (degenerative disc disease), lumbar   . Hepatitis C antibody test positive     HCV RNA is negative 03/2013  . Mild diastolic dysfunction 9/76/7341  . Esophagitis, erosive 03/2014  . Hepatic steatosis 04/23/2013  . Hypomagnesemia 04/23/2013  . Cirrhosis of liver without mention of alcohol     ct on 06/06/13= Subtle nodular contour of the liver. Cirrhosis cannot be excluded.   . PUD (peptic ulcer disease)   . Tubular adenoma   . Chronic abdominal pain   . Drug-seeking behavior     per [facility] admission records 01/04/14  . Chronic hypotension   . History of ETOH abuse     Past Surgical History  Procedure Laterality Date  . Cholecystectomy    . Tubal ligation    . Femur fracture surgery    . Ankle surgery    . Back surgery    . Esophagogastroduodenoscopy N/A 03/21/2013    Dr. Gala Romney: severe erosive reflux esophagitis, hiatal hernia, gastric ulcer with element of partial gastric outlet obstruction secondary to ulcer effect. Likely r/t NSAIDs. negative H.pylori  . Colonoscopy with esophagogastroduodenoscopy (egd) N/A 06/24/2013    Dr. Gala Romney: Erosive reflux esophagitis-much improved over that seen previously. Hiatal hernia. Previously noted gastric ulcer completely healed.  Portal gastropathy/TCS:Rectal varices/anal canal hemorrhoids. Portal colopathy.Colonic diverticulosis. Colonic polyp--tubular adenoma    Prior to Admission medications   Medication Sig Start Date End Date Taking? Authorizing Provider  cyanocobalamin (,VITAMIN B-12,) 1000 MCG/ML injection Inject 1,000 mcg into the muscle every 30 (thirty) days. Sunday   Yes Historical Provider, MD  furosemide (LASIX) 80 MG tablet Take 80 mg by mouth daily.   Yes Historical Provider,  MD  ipratropium-albuterol (DUONEB) 0.5-2.5 (3) MG/3ML SOLN Take 3 mLs by nebulization 4 (four) times daily.   Yes Historical Provider, MD  lactulose (CHRONULAC) 10 GM/15ML solution TAKE TWO TABLESPOONSFUL (30 ML) BY MOUTH TWICE DAILY 12/15/13  Yes Orvil Feil, NP  levothyroxine (SYNTHROID, LEVOTHROID) 50 MCG tablet Take 50 mcg by mouth daily before breakfast.   Yes Historical Provider, MD  morphine (MSIR) 15 MG tablet Take 15 mg by mouth 4 (four) times daily.   Yes Historical Provider, MD  pantoprazole (PROTONIX) 40 MG tablet Take 1 tablet (40 mg total) by mouth 2 (two) times daily before a meal. 03/26/13  Yes Samuella Cota, MD  potassium chloride (K-DUR,KLOR-CON) 10 MEQ tablet Take 10 mEq by mouth 4 (four) times daily.   Yes Historical Provider, MD  pregabalin (LYRICA) 50 MG capsule Take 50 mg by mouth 3 (three) times daily.   Yes Historical Provider, MD  sennosides-docusate sodium (SENOKOT-S) 8.6-50 MG tablet Take 2 tablets by mouth daily.   Yes Historical Provider, MD  spironolactone (ALDACTONE) 100 MG tablet Take 1 tablet (100 mg total) by mouth 2 (two) times daily. 06/14/13  Yes Nita Sells, MD  thiamine 100 MG tablet Take 100 mg by mouth daily.   Yes Historical Provider, MD    Current Facility-Administered Medications  Medication Dose Route Frequency Provider Last Rate Last Dose  . furosemide (LASIX) tablet 40 mg  40 mg Oral q morning - 10a Harriett Sine, MD   40 mg at 01/13/14 1047  . heparin injection 5,000 Units  5,000 Units Subcutaneous 3 times per day Waldemar Dickens, MD   5,000 Units at 01/13/14 0524  . HYDROmorphone (DILAUDID) injection 1 mg  1 mg Intravenous Q4H PRN Oswald Hillock, MD   1 mg at 01/12/14 1714  . lactulose (CHRONULAC) 10 GM/15ML solution 30 g  30 g Oral BID Donne Hazel, MD   30 g at 01/13/14 1047  . levothyroxine (SYNTHROID, LEVOTHROID) tablet 50 mcg  50 mcg Oral QAC breakfast Waldemar Dickens, MD   50 mcg at 01/13/14 1048  . midodrine (PROAMATINE)  tablet 5 mg  5 mg Oral TID WC Kathie Dike, MD   5 mg at 01/13/14 1048  . nicotine (NICODERM CQ - dosed in mg/24 hours) patch 14 mg  14 mg Transdermal Daily Waldemar Dickens, MD   14 mg at 01/13/14 1047  . nystatin cream (MYCOSTATIN) 1 application  1 application Topical BID Kathie Dike, MD   1 application at 83/38/25 1048  . ondansetron (ZOFRAN) injection 4 mg  4 mg Intravenous Q6H PRN Kathie Dike, MD   4 mg at 01/11/14 1214  . oxyCODONE (Oxy IR/ROXICODONE) immediate release tablet 5 mg  5 mg Oral Q4H PRN Waldemar Dickens, MD   5 mg at 01/13/14 1047  . pantoprazole (PROTONIX) EC tablet 40 mg  40 mg Oral BID AC Waldemar Dickens, MD   40 mg at 01/13/14 1047  . pregabalin (LYRICA) capsule 50 mg  50 mg Oral TID Waldemar Dickens, MD   50 mg at 01/13/14 1047  . sodium chloride 0.9 % bolus 250 mL  250 mL Intravenous Once Francine Graven, DO      . thiamine (VITAMIN B-1) tablet  100 mg  100 mg Oral Daily Waldemar Dickens, MD   100 mg at 01/13/14 1047  . traZODone (DESYREL) tablet 50 mg  50 mg Oral QHS PRN Waldemar Dickens, MD   50 mg at 01/12/14 2314  . vancomycin (VANCOCIN) IVPB 1000 mg/200 mL premix  1,000 mg Intravenous Q12H Donne Hazel, MD   1,000 mg at 01/13/14 1047    Allergies as of 01/06/2014 - Review Complete 01/06/2014  Allergen Reaction Noted  . Neurontin [gabapentin] Anaphylaxis 07/25/2011  . Acetaminophen Itching and Nausea And Vomiting 07/25/2011  . Aspirin  06/20/2013  . Eggs or egg-derived products Nausea And Vomiting 07/25/2011  . Tramadol  06/20/2013  . Codeine Itching and Nausea And Vomiting 07/25/2011    Family History  Problem Relation Age of Onset  . Colon cancer Neg Hx   . Kidney disease Mother     History   Social History  . Marital Status: Married    Spouse Name: N/A    Number of Children: N/A  . Years of Education: N/A   Occupational History  . Not on file.   Social History Main Topics  . Smoking status: Current Every Day Smoker -- 0.50 packs/day     Types: Cigarettes  . Smokeless tobacco: Not on file  . Alcohol Use: No     Comment: hx of ETOH abuse, none in 6 months  . Drug Use: No  . Sexual Activity: No   Other Topics Concern  . Not on file   Social History Narrative  . No narrative on file    Review of Systems: As mentioned in HPI  Physical Exam: Vital signs in last 24 hours: Temp:  [98 F (36.7 C)-98.7 F (37.1 C)] 98.7 F (37.1 C) (10/02 0651) Pulse Rate:  [78-97] 78 (10/02 0651) Resp:  [18-20] 18 (10/02 0651) BP: (67-80)/(36-54) 76/52 mmHg (10/02 0659) SpO2:  [91 %-94 %] 91 % (10/02 0651) Weight:  [170 lb 1.6 oz (77.157 kg)] 170 lb 1.6 oz (77.157 kg) (10/02 0651) Last BM Date: 01/12/14 General:   Alert and oriented, appears older than stated age. Chronically ill appearing. Head:  Normocephalic and atraumatic. Eyes:  Sclera clear, no icterus.    Ears:  Normal auditory acuity. Nose:  No deformity, discharge,  or lesions. Mouth:  No deformity or lesions Lungs:  Clear throughout to auscultation.   Heart:  S1 S2 present without murmurs Abdomen:  +BS, non-tense ascites, +fluid wave. Non-tender. Obese. Rectal:  Patient on bed pan at time of consultation   Extremities:  Without lower extremity edema Neurologic:  Alert and  oriented x4; mild asterixis Psych:  Alert and cooperative. Normal mood and affect.  Intake/Output from previous day: 10/01 0701 - 10/02 0700 In: 1220 [P.O.:720; IV Piggyback:500] Out: -  Intake/Output this shift:    Lab Results:  Recent Labs  01/11/14 0658 01/12/14 0625 01/12/14 1235 01/12/14 1750 01/13/14 0643  WBC 13.4* 11.9*  --   --  9.9  HGB 10.3* 10.9* 11.3* 10.6* 9.3*  HCT 29.6* 32.9* 34.0* 31.6* 27.8*  PLT 141* 133*  --   --  139*   BMET  Recent Labs  01/11/14 0658 01/12/14 0625 01/13/14 0643  NA 133* 137 136*  K 5.0 5.8* 4.1  CL 100 101 103  CO2 24 24 21   GLUCOSE 97 94 106*  BUN 12 15 16   CREATININE 0.72 1.07 1.14*  CALCIUM 8.2* 8.6 7.6*   LFT  Recent  Labs  01/11/14 0658 01/12/14 2353  PROT 5.9* 6.6  ALBUMIN 2.5* 2.5*  AST 27 27  ALT 9 10  ALKPHOS 134* 136*  BILITOT 1.6* 1.0   Lab Results  Component Value Date   IRON 133 03/22/2013   TIBC NOT CALC 03/22/2013   FERRITIN 1004* 01/07/2014    Studies/Results: Dg Chest Port 1 View  01/11/2014   CLINICAL DATA:  Cough and elevated white blood cell count  EXAM: PORTABLE CHEST - 1 VIEW  COMPARISON:  January 06, 2014  FINDINGS: Lungs are clear. Heart is upper normal in size with pulmonary vascularity within normal limits. No adenopathy. No bone lesions.  IMPRESSION: No edema or consolidation.   Electronically Signed   By: Lowella Grip M.D.   On: 01/11/2014 14:34    Impression: 53 year old female with history of non-compliance, multiple medical issues and previously discharged from our practice, presenting with decompensated cirrhosis (MELD 17, Child-Pugh Class C) , acute renal failure, persistent hypotension, anemia, encephalopathy in the setting of UTI, now with moderate volume hematochezia. Last evidence of rectal bleeding yesterday; acute on chronic anemia this admission with need for transfusion. Hgb has historically ranged from 8-11. Colonoscopy and EGD both up-to-date as of March 2015. She has known rectal varices/anal canal hemorrhoids, portal colopathy.  No indication for repeat colonoscopy; would provide supportive measures at this time. Anemia multifactorial in setting of chronic disease, oozing likely throughout GI tract in setting of portal gastropathy, portal colopathy. Will continue to follow with you; as she has been discharged from our practice, may need to follow-up with new GI physician as outpatient. Will need to discuss with previous attending GI physician, Dr. Gala Romney.   Plan: Follow Hgb Monitor for worsening of rectal bleeding; no bleeding today PPI BID for GI prophylaxis  Lactulose titrated to achieve 3 soft bowel movements daily Will continue to follow with  you  Orvil Feil, ANP-BC [facility]      LOS: 7 days    01/13/2014, 11:12 AM

## 2014-01-13 NOTE — Progress Notes (Signed)
[facility] PROGRESS NOTE  [name] MHD:[id number] DOB: 06-14-1960 DOA: 01/06/2014 PCP: Celedonio Savage, MD  Summary:  This patient presented to the hospital with complaints of abdominal pain/distention which worsened over the past 7 days prior to admission. She had left at [place] on 9/24 with aorta managing her pain adequately and were not performing a paracentesis. Her primary care physician increased her dose of Lasix from 40 to 80 mg daily. She had noticed decreased urine output for one week prior to admission. She was evaluated in the emergency room and noted to be in acute renal failure, she was hypotensive and had significant ascites. She was admitted to the hospital for further treatments. She was started on intravenous Lasix with albumin infusions, as well as intravenous fluids. She was followed by nephrology and her renal function has since returned to normal range. She underwent paracentesis during her hospital stay with removal of 4.3 L of fluid. It does not appear she has any underlying infection at this time. She is noted to be significantly hypotensive, although she does not appear septic or toxic. She's been started on midodrine. We will also check TSH and cortisol. Once her blood pressure stabilizes, then she could be discharged home with home health services.   Assessment/Plan: 1. Acute renal failure. Likely related to intravascular volume depletion in the setting of higher dose diuretics. She was on IV hydration. Nephrology following. Monitor urine output. Renal function has normalized. 2. Cirrhosis with ascites and anasarca. Patient has hypoalbuminemia that is likely contributing to her anasarca and ascites. She received albumin infusions coupled with Lasix.. She is also on aldactone. Her cirrhosis is likely secondary to alcohol and possibly fatty liver. Hepatitis panel was positive for C. antibody. HCV RNA. ANA, anti-smooth muscle antibody and  anti mitochondrial antibodies were checked recently and found to be negative. Paracentesis done on 9/29 with removal of 4.3 L of fluid. She will followup with her gastroenterologist and it in for further workup/management. 3. Hypotension. Echo shows normal ejection fraction.c, although she does report dizziness on standing. She's been started on midodrine. Patient does not appear to be significantly symptomatic. Cortisol w/in normal limits.TSH elevated. Will check free t4 4. Hyponatremia. Hypervolemic secondary to cirrhosis. Improved with Lasix. 5. Anemia, likely related to chronic disease with an acute component related to hemodilution. Macrocytic anemia. B12 and folate elevated.  No evidence of gross bleeding. She was transfused 2 units prbc thus far during this admit. Pt noted to have BRBPR on 10/1. GI consulted. Hgb has slowly trended down overnight. 6. Hypothyroidism. Continue synthroid. Free T4 pending. 7. Chronic pain. Cont opiates cautiously at this time due to low blood pressures. 8. COPD appears stable at this time. No shortness of breath or wheezing. 9. Encephalopathy. Likely secondary to elevated ammonia with concurrent UTI (see below). On abx. Have increased lactulose dose with resultant normalization of ammonia (62->29) 10. UTI without sepsis. Urine cx pending. Initially on empiric rocephin. Leukocytosis improving. Transitioned to PO ceftin.  Code Status: Full Family Communication: Pt in room Disposition Plan: Pending   Consultants:  Nephrology  Procedures:    Antibiotics:  None  HPI/Subjective: Remains eager to go home   Objective: Filed Vitals:   01/13/14 0212 01/13/14 0330 01/13/14 0651 01/13/14 0659  BP: 69/45 67/36 68/43  76/52  Pulse: 89  78   Temp: 98.4 F (36.9 C)  98.7 F (37.1 C)   TempSrc: Oral  Oral   Resp: 20  18   Height:  Weight:   77.157 kg (170 lb 1.6 oz)   SpO2: 94%  91%     Intake/Output Summary (Last 24 hours) at 01/13/14 1444 Last  data filed at 01/13/14 0636  Gross per 24 hour  Intake    740 ml  Output      0 ml  Net    740 ml   Filed Weights   01/11/14 0500 01/12/14 0634 01/13/14 0651  Weight: 76.8 kg (169 lb 5 oz) 76.068 kg (167 lb 11.2 oz) 77.157 kg (170 lb 1.6 oz)    Exam:   General:  Awake, in nad  Cardiovascular: regular,s1, s2  Respiratory: normal resp effort, no wheezing  Abdomen: soft,nondistended  Musculoskeletal: perfused, no clubbing   Data Reviewed: Basic Metabolic Panel:  Recent Labs Lab 01/09/14 0638 01/10/14 0555 01/11/14 0658 01/12/14 0625 01/13/14 0643  NA 133* 134* 133* 137 136*  K 4.3 5.0 5.0 5.8* 4.1  CL 99 99 100 101 103  CO2 22 23 24 24 21   GLUCOSE 152* 81 97 94 106*  BUN 24* 17 12 15 16   CREATININE 1.14* 0.90 0.72 1.07 1.14*  CALCIUM 8.1* 8.2* 8.2* 8.6 7.6*   Liver Function Tests:  Recent Labs Lab 01/07/14 0622 01/10/14 0555 01/11/14 0658 01/12/14 0625  AST 30 31 27 27   ALT 11 9 9 10   ALKPHOS 180* 147* 134* 136*  BILITOT 0.6 1.1 1.6* 1.0  PROT 6.1 6.2 5.9* 6.6  ALBUMIN 1.9* 2.7* 2.5* 2.5*   No results found for this basename: LIPASE, AMYLASE,  in the last 168 hours  Recent Labs Lab 01/07/14 1736 01/11/14 1345 01/12/14 0627  AMMONIA 49 62* 29   CBC:  Recent Labs Lab 01/09/14 0638 01/10/14 0555 01/11/14 0658 01/12/14 0625 01/12/14 1235 01/12/14 1750 01/13/14 0643  WBC 6.5 8.0 13.4* 11.9*  --   --  9.9  NEUTROABS  --  6.1  --  9.3*  --   --   --   HGB 9.4* 10.0* 10.3* 10.9* 11.3* 10.6* 9.3*  HCT 27.0* 29.7* 29.6* 32.9* 34.0* 31.6* 27.8*  MCV 98.5 100.7* 101.4* 104.4*  --   --  103.3*  PLT 124* 134* 141* 133*  --   --  139*   Cardiac Enzymes: No results found for this basename: CKTOTAL, CKMB, CKMBINDEX, TROPONINI,  in the last 168 hours BNP (last 3 results)  Recent Labs  04/22/13 1047 10/30/13 1612  PROBNP 254.9* 111.9   CBG: No results found for this basename: GLUCAP,  in the last 168 hours  Recent Results (from the past 240  hour(s))  BODY FLUID CULTURE     Status: None   Collection Time    01/06/14  4:28 PM      Result Value Ref Range Status   Specimen Description ASCITIC FLUID   Final   Special Requests NONE   Final   Gram Stain     Final   Value: NO WBC SEEN     NO ORGANISMS SEEN     Performed at [facility]   Culture     Final   Value: NO GROWTH 3 DAYS     Performed at [facility]   Report Status 01/10/2014 FINAL   Final  URINE CULTURE     Status: None   Collection Time    01/06/14  5:26 PM      Result Value Ref Range Status   Specimen Description URINE, CATHETERIZED   Final   Special Requests NONE  Final   Culture  Setup Time     Final   Value: 01/07/2014 00:32     Performed at [place] Count     Final   Value: 40,000 COLONIES/ML     Performed at [facility]   Culture     Final   Value: YEAST     Performed at [facility]   Report Status 01/08/2014 FINAL   Final  BODY FLUID CULTURE     Status: None   Collection Time    01/10/14  1:50 PM      Result Value Ref Range Status   Specimen Description ASCITIC FLUID   Final   Special Requests NONE   Final   Gram Stain     Final   Value: RARE WBC PRESENT, PREDOMINANTLY MONONUCLEAR     NO ORGANISMS SEEN     Performed at [facility]   Culture     Final   Value: NO GROWTH 3 DAYS     Performed at [facility]   Report Status PENDING   Incomplete  CULTURE, BLOOD (ROUTINE X 2)     Status: None   Collection Time    01/11/14 11:41 AM      Result Value Ref Range Status   Specimen Description BLOOD LEFT HAND   Final   Special Requests BOTTLES DRAWN AEROBIC AND ANAEROBIC [place]   Final   Culture NO GROWTH 1 DAY   Final   Report Status PENDING   Incomplete  CULTURE, BLOOD (ROUTINE X 2)     Status: None   Collection Time    01/11/14 11:46 AM      Result Value Ref Range Status   Specimen Description BLOOD LEFT HAND   Final   Special Requests BOTTLES DRAWN AEROBIC AND  ANAEROBIC 8CC EACH   Final   Culture     Final   Value: GRAM POSITIVE RODS     Gram Stain Report Called to,Read Back By and Verified With: THOMAS,K AT 2145 ON 01/12/2014 BY ISLEY,B    Report Status PENDING   Incomplete  URINE CULTURE     Status: None   Collection Time    01/11/14  3:55 PM      Result Value Ref Range Status   Specimen Description URINE, CATHETERIZED   Final   Special Requests NONE   Final   Culture  Setup Time     Final   Value: 01/11/2014 18:50     Performed at [place] PENDING   Incomplete   Culture     Final   Value: Culture reincubated for better growth     Performed at [facility]   Report Status PENDING   Incomplete     Studies: No results found.  Scheduled Meds: . furosemide  40 mg Oral q morning - 10a  . heparin  5,000 Units Subcutaneous 3 times per day  . lactulose  30 g Oral BID  . levothyroxine  50 mcg Oral QAC breakfast  . midodrine  5 mg Oral TID WC  . nicotine  14 mg Transdermal Daily  . nystatin cream  1 application Topical BID  . pantoprazole  40 mg Oral BID AC  . pregabalin  50 mg Oral TID  . sodium chloride  250 mL Intravenous Once  . thiamine  100 mg Oral Daily   Continuous Infusions:   Active Problems:   Hypothyroid  ETOH abuse   Chronic pain   Hepatitis C   Hypotension, unspecified   Anasarca   Hypoalbuminemia   Chronic liver disease   Ascites   Anemia   Acute renal failure   Hyponatremia  Time spent: 5min  Mintie Witherington, [facility] Pager [phone]. If 7PM-7AM, please contact night-coverage at [email or web address], password [facility] 01/13/2014, 2:44 PM  LOS: 7 days

## 2014-01-13 NOTE — Consult Note (Signed)
REVIEWED. LAST TCS MAR 2015 HEMORRHOIDS AND RECTAL VARICES. PT D/C FROM PRACTICE. RECOMMEND ANUSOL HC SUPP BID FOR 12 DAYS AND OUTPATIENT F/U WITH ALTERNATIVE GI PRACTICE.

## 2014-01-13 NOTE — Progress Notes (Signed)
BP continues to run low. Pt asking for Dilaudid IV which has been held due to low BP. Called MD and discussed Pt's low BP and continuing to hold Dilaudid. No new orders at this time.

## 2014-01-13 NOTE — Progress Notes (Signed)
ANTIBIOTIC CONSULT NOTE - INITIAL  Pharmacy Consult for Vancomycin and Rocephin Indication: rule out sepsis, bacteremia, UTI  Allergies  Allergen Reactions  . Neurontin [Gabapentin] Anaphylaxis  . Acetaminophen Itching and Nausea And Vomiting  . Aspirin     Intolerance because of peptic ulcer  . Eggs Or Egg-Derived Products Nausea And Vomiting  . Tramadol     Nausea and vomiting  . Codeine Itching and Nausea And Vomiting    Patient is able to take morphine, demerol and fentanyl with out any type of side effects   Patient Measurements: Height: 5\' 4"  (162.6 cm) Weight: 170 lb 1.6 oz (77.157 kg) IBW/kg (Calculated) : 54.7  Vital Signs: Temp: 98.2 F (36.8 C) (10/02 1446) Temp Source: Oral (10/02 1446) BP: 79/52 mmHg (10/02 1446) Pulse Rate: 86 (10/02 1446) Intake/Output from previous day: 10/01 0701 - 10/02 0700 In: 1220 [P.O.:720; IV Piggyback:500] Out: -  Intake/Output from this shift:    Labs:  Recent Labs  01/11/14 0658 01/12/14 0625 01/12/14 1235 01/12/14 1750 01/13/14 0643  WBC 13.4* 11.9*  --   --  9.9  HGB 10.3* 10.9* 11.3* 10.6* 9.3*  PLT 141* 133*  --   --  139*  CREATININE 0.72 1.07  --   --  1.14*   Estimated Creatinine Clearance: 57.4 ml/min (by C-G formula based on Cr of 1.14). No results found for this basename: VANCOTROUGH, Corlis Leak, VANCORANDOM, [place], GENTPEAK, [place], [place], TOBRAPEAK, TOBRARND, AMIKACINPEAK, AMIKACINTROU, AMIKACIN,  in the last 72 hours   Microbiology: Recent Results (from the past 720 hour(s))  BODY FLUID CULTURE     Status: None   Collection Time    01/06/14  4:28 PM      Result Value Ref Range Status   Specimen Description ASCITIC FLUID   Final   Special Requests NONE   Final   Gram Stain     Final   Value: NO WBC SEEN     NO ORGANISMS SEEN     Performed at [facility]   Culture     Final   Value: NO GROWTH 3 DAYS     Performed at [facility]   Report Status 01/10/2014 FINAL    Final  URINE CULTURE     Status: None   Collection Time    01/06/14  5:26 PM      Result Value Ref Range Status   Specimen Description URINE, CATHETERIZED   Final   Special Requests NONE   Final   Culture  Setup Time     Final   Value: 01/07/2014 00:32     Performed at [place]     Final   Value: 40,000 COLONIES/ML     Performed at [facility]   Culture     Final   Value: YEAST     Performed at [facility]   Report Status 01/08/2014 FINAL   Final  BODY FLUID CULTURE     Status: None   Collection Time    01/10/14  1:50 PM      Result Value Ref Range Status   Specimen Description ASCITIC FLUID   Final   Special Requests NONE   Final   Gram Stain     Final   Value: RARE WBC PRESENT, PREDOMINANTLY MONONUCLEAR     NO ORGANISMS SEEN     Performed at [facility]   Culture     Final   Value: NO GROWTH 3  DAYS     Performed at [facility]   Report Status PENDING   Incomplete  CULTURE, BLOOD (ROUTINE X 2)     Status: None   Collection Time    01/11/14 11:41 AM      Result Value Ref Range Status   Specimen Description BLOOD LEFT HAND   Final   Special Requests BOTTLES DRAWN AEROBIC AND ANAEROBIC 8CC EACH   Final   Culture NO GROWTH 1 DAY   Final   Report Status PENDING   Incomplete  CULTURE, BLOOD (ROUTINE X 2)     Status: None   Collection Time    01/11/14 11:46 AM      Result Value Ref Range Status   Specimen Description BLOOD LEFT HAND   Final   Special Requests BOTTLES DRAWN AEROBIC AND ANAEROBIC 8CC EACH   Final   Culture     Final   Value: GRAM POSITIVE RODS     Gram Stain Report Called to,Read Back By and Verified With: THOMAS,K AT 2145 ON 01/12/2014 BY ISLEY,B    Report Status PENDING   Incomplete  URINE CULTURE     Status: None   Collection Time    01/11/14  3:55 PM      Result Value Ref Range Status   Specimen Description URINE, CATHETERIZED   Final   Special Requests NONE   Final   Culture  Setup Time      Final   Value: 01/11/2014 18:50     Performed at [place] PENDING   Incomplete   Culture     Final   Value: Culture reincubated for better growth     Performed at [facility]   Report Status PENDING   Incomplete   Medical History: Past Medical History  Diagnosis Date  . Guillain-Barre   . Blind   . Neuropathy   . Thyroid disease   . Back pain   . Arthritis   . Seizures   . UTI (lower urinary tract infection)   . Anxiety   . Depression   . DDD (degenerative disc disease), lumbar   . Hepatitis C antibody test positive     HCV RNA is negative 03/2013  . Mild diastolic dysfunction 1/66/0630  . Esophagitis, erosive 03/2014  . Hepatic steatosis 04/23/2013  . Hypomagnesemia 04/23/2013  . Cirrhosis of liver without mention of alcohol     ct on 06/06/13= Subtle nodular contour of the liver. Cirrhosis cannot be excluded.   . PUD (peptic ulcer disease)   . Tubular adenoma   . Chronic abdominal pain   . Drug-seeking behavior     per [facility] admission records 01/04/14  . Chronic hypotension   . History of ETOH abuse    Anti-infectives   Start     Dose/Rate Route Frequency Ordered Stop   01/13/14 2200  vancomycin (VANCOCIN) IVPB 1000 mg/200 mL premix     1,000 mg 200 mL/hr over 60 Minutes Intravenous Every 12 hours 01/13/14 1627     01/13/14 1800  cefTRIAXone (ROCEPHIN) 1 g in dextrose 5 % 50 mL IVPB     1 g 100 mL/hr over 30 Minutes Intravenous Every 24 hours 01/13/14 1628     01/13/14 1000  vancomycin (VANCOCIN) IVPB 1000 mg/200 mL premix  Status:  Discontinued     1,000 mg 200 mL/hr over 60 Minutes Intravenous Every 12 hours 01/12/14 2217 01/13/14 1203   01/12/14 2300  vancomycin (VANCOCIN)  1,500 mg in sodium chloride 0.9 % 500 mL IVPB     1,500 mg 250 mL/hr over 120 Minutes Intravenous  Once 01/12/14 2216 01/13/14 0254   01/12/14 1700  cefUROXime (CEFTIN) tablet 250 mg  Status:  Discontinued     250 mg Oral 2 times daily with  meals 01/12/14 1253 01/12/14 2215   01/11/14 1800  cefTRIAXone (ROCEPHIN) 1 g in dextrose 5 % 50 mL IVPB  Status:  Discontinued     1 g 100 mL/hr over 30 Minutes Intravenous Every 24 hours 01/11/14 1703 01/11/14 1709   01/11/14 1800  cefTRIAXone (ROCEPHIN) 2 g in dextrose 5 % 50 mL IVPB  Status:  Discontinued     2 g 100 mL/hr over 30 Minutes Intravenous Every 24 hours 01/11/14 1709 01/12/14 1253     Assessment: Afebrile.  Good renal fxn.  Estimated Creatinine Clearance: 57.4 ml/min (by C-G formula based on Cr of 1.14). Asked to restart Vancomycin for suspected sepsis / bacteremia. Rocephin for UTI. 1/2 blood cx with [facility] Patient received Vancomycin 1500mg  IV last night and 1000mg  IV this morning before being d/c'd.    Goal of Therapy:  Vancomycin trough level 15-20 mcg/ml  Plan:  Vancomycin 1000mg  IV q12hrs Check trough at steady state Rocephin 1gm IV q24hrs. Monitor labs, renal fxn, and cultures  Hart Robinsons A 01/13/2014,4:28 PM

## 2014-01-14 DIAGNOSIS — G8929 Other chronic pain: Secondary | ICD-10-CM

## 2014-01-14 DIAGNOSIS — R601 Generalized edema: Secondary | ICD-10-CM

## 2014-01-14 DIAGNOSIS — N179 Acute kidney failure, unspecified: Secondary | ICD-10-CM

## 2014-01-14 DIAGNOSIS — D649 Anemia, unspecified: Secondary | ICD-10-CM

## 2014-01-14 DIAGNOSIS — K625 Hemorrhage of anus and rectum: Secondary | ICD-10-CM

## 2014-01-14 LAB — CULTURE, BLOOD (ROUTINE X 2)

## 2014-01-14 LAB — BODY FLUID CULTURE: Culture: NO GROWTH

## 2014-01-14 LAB — URINE CULTURE: Colony Count: >=100000

## 2014-01-14 LAB — CBC
HEMATOCRIT: 28.4 % — AB (ref 36.0–46.0)
Hemoglobin: 9.5 g/dL — ABNORMAL LOW (ref 12.0–15.0)
MCH: 34.4 pg — AB (ref 26.0–34.0)
MCHC: 33.5 g/dL (ref 30.0–36.0)
MCV: 102.9 fL — ABNORMAL HIGH (ref 78.0–100.0)
Platelets: 142 10*3/uL — ABNORMAL LOW (ref 150–400)
RBC: 2.76 MIL/uL — ABNORMAL LOW (ref 3.87–5.11)
RDW: 17 % — ABNORMAL HIGH (ref 11.5–15.5)
WBC: 7 10*3/uL (ref 4.0–10.5)

## 2014-01-14 LAB — COMPREHENSIVE METABOLIC PANEL
ALK PHOS: 114 U/L (ref 39–117)
ALT: 8 U/L (ref 0–35)
ANION GAP: 14 (ref 5–15)
AST: 18 U/L (ref 0–37)
Albumin: 2.3 g/dL — ABNORMAL LOW (ref 3.5–5.2)
BUN: 16 mg/dL (ref 6–23)
CO2: 20 mEq/L (ref 19–32)
Calcium: 7.6 mg/dL — ABNORMAL LOW (ref 8.4–10.5)
Chloride: 103 mEq/L (ref 96–112)
Creatinine, Ser: 1.15 mg/dL — ABNORMAL HIGH (ref 0.50–1.10)
GFR calc Af Amer: 62 mL/min — ABNORMAL LOW (ref >90)
GFR calc non Af Amer: 53 mL/min — ABNORMAL LOW (ref >90)
Glucose, Bld: 87 mg/dL (ref 70–99)
POTASSIUM: 4.1 meq/L (ref 3.7–5.3)
SODIUM: 137 meq/L (ref 137–147)
TOTAL PROTEIN: 6.1 g/dL (ref 6.0–8.3)
Total Bilirubin: 0.7 mg/dL (ref 0.3–1.2)

## 2014-01-14 LAB — T4, FREE: FREE T4: 1.03 ng/dL (ref 0.80–1.80)

## 2014-01-14 MED ORDER — LACTULOSE 10 GM/15ML PO SOLN
30.0000 g | Freq: Every day | ORAL | Status: DC
  Filled 2014-01-14: qty 60

## 2014-01-14 MED ORDER — CEPHALEXIN 500 MG PO CAPS: 500.0000 mg | ORAL_CAPSULE | Freq: Two times a day (BID) | ORAL | Status: DC

## 2014-01-14 MED ORDER — LACTULOSE 10 GM/15ML PO SOLN: 30.0000 g | Freq: Every day | ORAL | Status: DC

## 2014-01-14 NOTE — Progress Notes (Signed)
Patient discharge teaching given, including activity, diet, follow-up appoints, and medications. Patient verbalized understanding of all discharge instructions. IV access was d/c'd. Vitals are stable. Skin is intact except as charted in most recent assessments. Pt to be escorted out by NT, to be driven home by Spouse.  George Hugh D, RN.

## 2014-01-14 NOTE — Discharge Summary (Signed)
Physician Discharge Summary  AYZIA DAY HLK:[id number] DOB: 07/17/1960 DOA: 01/06/2014  PCP: Celedonio Savage, MD  Admit date: 01/06/2014 Discharge date: 01/14/2014  Time spent: 35 minutes  Recommendations for Outpatient Follow-up:  1. Follow up with PCP in 1-2 weeks 2. Follow up with Nephrology as scheduled 3. Follow up with Gastroenterology at earliest appt 4. Would check free T4 as outpatient 5. Repeat renal panel in 1-2 weeks  Discharge Diagnoses:  Active Problems:   Hypothyroid   ETOH abuse   Chronic pain   Hepatitis C   Hypotension, unspecified   Anasarca   Hypoalbuminemia   Chronic liver disease   Ascites   Anemia   Acute renal failure   Hyponatremia   Discharge Condition: Improved  Diet recommendation: Low sodium, heart healthy  Filed Weights   01/12/14 0634 01/13/14 0651 01/14/14 0600  Weight: 76.068 kg (167 lb 11.2 oz) 77.157 kg (170 lb 1.6 oz) 77.701 kg (171 lb 4.8 oz)    History of present illness:  Please see admit h and p from 9/25 for details. Briefly, pt presents with complaints of abd pain, found to have hypotension and marked ascites. Pt was admitted for further work up and evaluation.  Hospital Course:  Summary:  This patient presented to the hospital with complaints of abdominal pain/distention which worsened over the past 7 days prior to admission. She had left at [place] on 9/24 with aorta managing her pain adequately and were not performing a paracentesis. Her primary care physician increased her dose of Lasix from 40 to 80 mg daily. She had noticed decreased urine output for one week prior to admission. She was evaluated in the emergency room and noted to be in acute renal failure, she was hypotensive and had significant ascites. She was admitted to the hospital for further treatments. She was started on intravenous Lasix with albumin infusions, as well as intravenous fluids. She was followed by nephrology and her renal  function has since returned to normal range. She underwent paracentesis during her hospital stay with removal of 4.3 L of fluid. It does not appear she has any underlying infection at this time. She is noted to be significantly hypotensive, although she does not appear septic or toxic. She's been started on midodrine. We will also check TSH and cortisol. Once her blood pressure stabilizes, then she could be discharged home with home health services.  1. Acute renal failure. Likely related to intravascular volume depletion in the setting of higher dose diuretics. She was on IV hydration. Nephrology was consulted and had been following. Renal function has since normalized. 2. Cirrhosis with ascites and anasarca. Patient has hypoalbuminemia that is likely contributing to her anasarca and ascites. She received albumin infusions coupled with Lasix.. She is also on aldactone. Her cirrhosis is likely secondary to alcohol and possibly fatty liver. Hepatitis panel was positive for C. antibody. HCV RNA. ANA, anti-smooth muscle antibody and anti mitochondrial antibodies were checked recently and found to be negative. Paracentesis done on 9/29 with removal of 4.3 L of fluid. She will followup with her gastroenterologist and it in for further workup/management. 3. Hypotension. Echo shows normal ejection fraction.c, although she does report dizziness on standing. She's been started on midodrine. Patient does not appear to be significantly symptomatic. Cortisol w/in normal limits.TSH elevated. Would recommend checking free t4 as an outpatient 4. Hyponatremia. Hypervolemic secondary to cirrhosis. Improved with Lasix. 5. Anemia, likely related to chronic disease with an acute component related to hemodilution.  Macrocytic anemia. B12 and folate elevated. No evidence of gross bleeding. She was transfused 2 units prbc thus far during this admit. Pt noted to have BRBPR on 10/1. GI consulted. Per GI, likely hemorrhoids with  recommendations for Anusol. 6. Hypothyroidism. Continued synthroid. Recommend follow up free T4 as outpatient 7. Chronic pain. Cont opiates cautiously at this time due to low blood pressures. 8. COPD appears stable at this time. No shortness of breath or wheezing. 9. Encephalopathy. Likely secondary to elevated ammonia with concurrent UTI (see below). On abx. Had increased lactulose dose with resultant normalization of ammonia (62->29).  10. UTI without sepsis. Urine cx pending. Initially on empiric rocephin. Leukocytosis improving. Urine cx with >100,000 staph aureus. Will d/c with keflex. Of note, pt was found to have 1/2 blood cultures of corynebacterium species, which is likely contaminant. Pt was empirically covered with vanc and zosyn.  Procedures:  Paracentesis 9/29  Consultations:  IR  GI  Nephrology  Discharge Exam: Filed Vitals:   01/13/14 0659 01/13/14 1446 01/13/14 2200 01/14/14 0600  BP: 76/52 79/52 80/45  81/56  Pulse:  86 72 81  Temp:  98.2 F (36.8 C) 98.9 F (37.2 C) 98.2 F (36.8 C)  TempSrc:  Oral Oral Oral  Resp:  18 20 18   Height:      Weight:    77.701 kg (171 lb 4.8 oz)  SpO2:  98% 95% 98%    General: Awake, in nad Cardiovascular: regular, s1, s2 Respiratory: normal resp effort, no wheezing  Discharge Instructions     Medication List    STOP taking these medications       spironolactone 100 MG tablet  Commonly known as:  ALDACTONE      TAKE these medications       cephALEXin 500 MG capsule  Commonly known as:  KEFLEX  Take 1 capsule (500 mg total) by mouth 2 (two) times daily.     cyanocobalamin 1000 MCG/ML injection  Commonly known as:  (VITAMIN B-12)  Inject 1,000 mcg into the muscle every 30 (thirty) days. Sunday     furosemide 40 MG tablet  Commonly known as:  LASIX  Take 1 tablet (40 mg total) by mouth every morning.     hydrocortisone 25 MG suppository  Commonly known as:  ANUSOL-HC  Place 1 suppository (25 mg total)  rectally 2 (two) times daily.     ipratropium-albuterol 0.5-2.5 (3) MG/3ML Soln  Commonly known as:  DUONEB  Take 3 mLs by nebulization 4 (four) times daily.     lactulose 10 GM/15ML solution  Commonly known as:  CHRONULAC  TAKE TWO TABLESPOONSFUL (30 ML) BY MOUTH TWICE DAILY     levothyroxine 50 MCG tablet  Commonly known as:  SYNTHROID, LEVOTHROID  Take 50 mcg by mouth daily before breakfast.     midodrine 5 MG tablet  Commonly known as:  PROAMATINE  Take 1 tablet (5 mg total) by mouth 3 (three) times daily with meals.     morphine 15 MG tablet  Commonly known as:  MSIR  Take 15 mg by mouth 4 (four) times daily.     oxyCODONE 5 MG immediate release tablet  Commonly known as:  Oxy IR/ROXICODONE  Take 1 tablet (5 mg total) by mouth every 4 (four) hours as needed for moderate pain.     pantoprazole 40 MG tablet  Commonly known as:  PROTONIX  Take 1 tablet (40 mg total) by mouth 2 (two) times daily before a meal.  potassium chloride 10 MEQ tablet  Commonly known as:  K-DUR,KLOR-CON  Take 10 mEq by mouth 4 (four) times daily.     pregabalin 50 MG capsule  Commonly known as:  LYRICA  Take 50 mg by mouth 3 (three) times daily.     sennosides-docusate sodium 8.6-50 MG tablet  Commonly known as:  SENOKOT-S  Take 2 tablets by mouth daily.     thiamine 100 MG tablet  Take 100 mg by mouth daily.       Allergies  Allergen Reactions  . Neurontin [Gabapentin] Anaphylaxis  . Acetaminophen Itching and Nausea And Vomiting  . Aspirin     Intolerance because of peptic ulcer  . Eggs Or Egg-Derived Products Nausea And Vomiting  . Tramadol     Nausea and vomiting  . Codeine Itching and Nausea And Vomiting    Patient is able to take morphine, demerol and fentanyl with out any type of side effects   Follow-up Information   Follow up with [facility], MD In 4 weeks.   Specialty:  Nephrology   Contact information:   [address]  [address] [phone]       Follow up with Celedonio Savage, MD. Schedule an appointment as soon as possible for a visit in 1 week.   Specialty:  Family Medicine   Contact information:   [address] [place] [address] [phone]        The results of significant diagnostics from this hospitalization (including imaging, microbiology, ancillary and laboratory) are listed below for reference.    Significant Diagnostic Studies: US Abdomen Limited  01/06/2014   CLINICAL DATA:  Distended abdomen.  EXAM: LIMITED ABDOMEN ULTRASOUND FOR ASCITES  TECHNIQUE: Limited ultrasound survey for ascites was performed in all four abdominal quadrants.  COMPARISON:  12/22/2013.  FINDINGS: Moderate, uncomplicated ascites distends the abdomen.  IMPRESSION: Moderate ascites.   Electronically Signed   By: Lajean Manes M.D.   On: 01/06/2014 15:38   US Paracentesis  01/10/2014   CLINICAL DATA:  Ascites  EXAM: ULTRASOUND GUIDED  PARACENTESIS  COMPARISON:  None.  PROCEDURE: An ultrasound guided paracentesis was thoroughly discussed with the patient and questions answered. The benefits, risks, alternatives and complications were also discussed. The patient understands and wishes to proceed with the procedure. Written consent was obtained.  Ultrasound was performed to localize and mark an adequate pocket of fluid in the left lower quadrant of the abdomen. The area was then prepped and draped in the normal sterile fashion. 1% Lidocaine was used for local anesthesia. Under ultrasound guidance a 19 gauge Yueh catheter was introduced. Paracentesis was performed. The catheter was removed and a dressing applied.  Complications: None.  FINDINGS: A total of approximately 4.3 L of clear amber fluid was removed. A fluid sample was sent for laboratory analysis.  IMPRESSION: Successful ultrasound guided paracentesis yielding 4.3 L of ascites.   Electronically Signed   By: Rolm Baptise M.D.   On: 01/10/2014 15:05   US  Paracentesis  01/09/2014   CLINICAL DATA:  Ascites.  EXAM: ULTRASOUND GUIDED  PARACENTESIS  COMPARISON:  None.  PROCEDURE: An ultrasound guided paracentesis was thoroughly discussed with the patient and questions answered. The benefits, risks, alternatives and complications were also discussed. The patient understands and wishes to proceed with the procedure. Written consent was obtained.  Ultrasound was performed to localize and mark an adequate pocket of fluid in the right lower quadrant of the abdomen. The area was then  prepped and draped in the normal sterile fashion. 1% Lidocaine was used for local anesthesia. Under ultrasound guidance a 19 gauge Yueh catheter was introduced. Paracentesis was performed. The catheter was removed and a dressing applied.  Complications: None.  FINDINGS: A total of approximately 4.6 L of clear yellow fluid was removed. A fluid sample was sent for laboratory analysis.  IMPRESSION: Successful ultrasound guided paracentesis yielding 4.6 L of ascites.   Electronically Signed   By: Rolm Baptise M.D.   On: 01/09/2014 07:06   Dg Chest Port 1 View  01/11/2014   CLINICAL DATA:  Cough and elevated white blood cell count  EXAM: PORTABLE CHEST - 1 VIEW  COMPARISON:  January 06, 2014  FINDINGS: Lungs are clear. Heart is upper normal in size with pulmonary vascularity within normal limits. No adenopathy. No bone lesions.  IMPRESSION: No edema or consolidation.   Electronically Signed   By: Lowella Grip M.D.   On: 01/11/2014 14:34   Dg Abd Acute W/chest  01/06/2014   CLINICAL DATA:  Abdominal pain and distension, shortness of breath, history cirrhosis, hepatic steatosis, Guillain-Barre, hepatitis-C  EXAM: ACUTE ABDOMEN SERIES (ABDOMEN 2 VIEW & CHEST 1 VIEW)  COMPARISON:  Chest radiograph 10/30/2013, abdominal radiographs 06/10/2013  FINDINGS: Upper normal heart size.  Normal mediastinal contours and pulmonary vascularity.  Lungs clear.  No pleural effusion or pneumothorax.  Surgical  clips RIGHT upper quadrant.  Increased attenuation of abdomen consistent with ascites.  Gaseous distention of transverse colon though this may be artifactually accentuated by significant abdominal distension / AP diameter of the abdomen.  No definite evidence of bowel obstruction or wall thickening.  No free intraperitoneal air.  IMPRESSION: No acute thoracic findings.  Ascites without evidence of bowel obstruction.   Electronically Signed   By: Lavonia Dana M.D.   On: 01/06/2014 16:07    Microbiology: Recent Results (from the past 240 hour(s))  BODY FLUID CULTURE     Status: None   Collection Time    01/06/14  4:28 PM      Result Value Ref Range Status   Specimen Description ASCITIC FLUID   Final   Special Requests NONE   Final   Gram Stain     Final   Value: NO WBC SEEN     NO ORGANISMS SEEN     Performed at [facility]   Culture     Final   Value: NO GROWTH 3 DAYS     Performed at [facility]   Report Status 01/10/2014 FINAL   Final  URINE CULTURE     Status: None   Collection Time    01/06/14  5:26 PM      Result Value Ref Range Status   Specimen Description URINE, CATHETERIZED   Final   Special Requests NONE   Final   Culture  Setup Time     Final   Value: 01/07/2014 00:32     Performed at [place]     Final   Value: 40,000 COLONIES/ML     Performed at [facility]   Culture     Final   Value: YEAST     Performed at [facility]   Report Status 01/08/2014 FINAL   Final  BODY FLUID CULTURE     Status: None   Collection Time    01/10/14  1:50 PM      Result Value Ref Range Status   Specimen Description ASCITIC  FLUID   Final   Special Requests NONE   Final   Gram Stain     Final   Value: RARE WBC PRESENT, PREDOMINANTLY MONONUCLEAR     NO ORGANISMS SEEN     Performed at [facility]   Culture     Final   Value: NO GROWTH 3 DAYS     Performed at [facility]   Report Status 01/14/2014 FINAL    Final  CULTURE, BLOOD (ROUTINE X 2)     Status: None   Collection Time    01/11/14 11:41 AM      Result Value Ref Range Status   Specimen Description BLOOD LEFT HAND   Final   Special Requests BOTTLES DRAWN AEROBIC AND ANAEROBIC 8CC EACH   Final   Culture NO GROWTH 1 DAY   Final   Report Status PENDING   Incomplete  CULTURE, BLOOD (ROUTINE X 2)     Status: None   Collection Time    01/11/14 11:46 AM      Result Value Ref Range Status   Specimen Description BLOOD LEFT HAND   Final   Special Requests BOTTLES DRAWN AEROBIC AND ANAEROBIC [facility] EACH   Final   Culture  Setup Time     Final   Value: 01/12/2014 21:05     Performed at [facility]   Culture     Final   Value: DIPHTHEROIDS(CORYNEBACTERIUM SPECIES)     Note: Standardized susceptibility testing for this organism is not available.     Note: Gram Stain Report Called to,Read Back By and Verified With: THOMAS K @2145  01/12/14 BY ISLEY B     Performed at [facility]   Report Status 01/14/2014 FINAL   Final  URINE CULTURE     Status: None   Collection Time    01/11/14  3:55 PM      Result Value Ref Range Status   Specimen Description URINE, CATHETERIZED   Final   Special Requests NONE   Final   Culture  Setup Time     Final   Value: 01/11/2014 18:50     Performed at [place]     Final   Value: >=100,000 COLONIES/ML     Performed at [facility]   Culture     Final   Value: STAPHYLOCOCCUS SPECIES     Note: RIFAMPIN AND GENTAMICIN SHOULD NOT BE USED AS SINGLE DRUGS FOR TREATMENT OF STAPH INFECTIONS.     Performed at [facility]   Report Status PENDING   Incomplete     Labs: Basic Metabolic Panel:  Recent Labs Lab 01/10/14 0555 01/11/14 0658 01/12/14 0625 01/13/14 0643 01/14/14 0642  NA 134* 133* 137 136* 137  K 5.0 5.0 5.8* 4.1 4.1  CL 99 100 101 103 103  CO2 23 24 24 21 20   GLUCOSE 81 97 94 106* 87  BUN 17 12 15 16 16   CREATININE 0.90 0.72 1.07 1.14*  1.15*  CALCIUM 8.2* 8.2* 8.6 7.6* 7.6*   Liver Function Tests:  Recent Labs Lab 01/10/14 0555 01/11/14 0658 01/12/14 0625 01/14/14 0642  AST 31 27 27 18   ALT 9 9 10 8   ALKPHOS 147* 134* 136* 114  BILITOT 1.1 1.6* 1.0 0.7  PROT 6.2 5.9* 6.6 6.1  ALBUMIN 2.7* 2.5* 2.5* 2.3*   No results found for this basename: LIPASE, AMYLASE,  in the last 168 hours  Recent Labs Lab 01/07/14 1736  01/11/14 1345 01/12/14 0627  AMMONIA 49 62* 29   CBC:  Recent Labs Lab 01/10/14 0555 01/11/14 0658 01/12/14 0625 01/12/14 1235 01/12/14 1750 01/13/14 0643 01/14/14 0642  WBC 8.0 13.4* 11.9*  --   --  9.9 7.0  NEUTROABS 6.1  --  9.3*  --   --   --   --   HGB 10.0* 10.3* 10.9* 11.3* 10.6* 9.3* 9.5*  HCT 29.7* 29.6* 32.9* 34.0* 31.6* 27.8* 28.4*  MCV 100.7* 101.4* 104.4*  --   --  103.3* 102.9*  PLT 134* 141* 133*  --   --  139* 142*   Cardiac Enzymes: No results found for this basename: CKTOTAL, CKMB, CKMBINDEX, TROPONINI,  in the last 168 hours BNP: BNP (last 3 results)  Recent Labs  04/22/13 1047 10/30/13 1612  PROBNP 254.9* 111.9   CBG: No results found for this basename: GLUCAP,  in the last 168 hours  Signed:  CHIU, STEPHEN K  [facility] 01/14/2014, 1:45 PM

## 2014-01-16 LAB — CULTURE, BLOOD (ROUTINE X 2): Culture: NO GROWTH

## 2014-01-16 NOTE — Progress Notes (Signed)
UR chart review completed.  

## 2014-01-31 ENCOUNTER — Telehealth: Payer: Self-pay | Admitting: Internal Medicine

## 2014-01-31 NOTE — Telephone Encounter (Signed)
Pt was discharged from our practice and her home health nurse Arbie Cookey) called to see if [facility] would consider taking her as a patient. 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 Her family resources haven't been helping out like they should, but the nurse said that she has spoke with the family about how important is was for they to step up and get patient to her doctor's appointments. I told her that I would have to get approval from [facility] before scheduling and someone would call her back. The nurse asked for [place] to call her at [phone] Please advise.  I recently got a release of records to forward her records to Dr [facility] office, but she didn't like him and now wants to see [facility].

## 2014-01-31 NOTE — Telephone Encounter (Signed)
Patient was a patient of mine. She was discharged. I suggest she seek out GI care elsewhere.

## 2014-01-31 NOTE — Telephone Encounter (Signed)
PT IS A [facility]MR PT. WOULD DISCUSS WITH HIM.

## 2014-01-31 NOTE — Telephone Encounter (Signed)
This patient was previously discharged from the practice. I would be inclined not to change her status. Doctor's cross cover in this practice.

## 2014-02-01 NOTE — Telephone Encounter (Signed)
I tried to call the home health nurse back to let he know Dr. Roseanne Kaufman decesion, no answer,lmom

## 2014-02-07 NOTE — Telephone Encounter (Signed)
Pt is aware of the discharge

## 2014-02-20 ENCOUNTER — Emergency Department (HOSPITAL_COMMUNITY): Payer: Medicare Other

## 2014-02-20 ENCOUNTER — Emergency Department (HOSPITAL_COMMUNITY)
Admission: EM | Admit: 2014-02-20 | Discharge: 2014-02-21 | Disposition: A | Discharge status: Home / Self Care | Payer: Medicare Other | Attending: Emergency Medicine | Admitting: Emergency Medicine

## 2014-02-20 ENCOUNTER — Encounter (HOSPITAL_COMMUNITY): Payer: Self-pay | Admitting: Emergency Medicine

## 2014-02-20 DIAGNOSIS — Z87440 Personal history of urinary (tract) infections: Secondary | ICD-10-CM | POA: Insufficient documentation

## 2014-02-20 DIAGNOSIS — R059 Cough, unspecified: Secondary | ICD-10-CM

## 2014-02-20 DIAGNOSIS — E079 Disorder of thyroid, unspecified: Secondary | ICD-10-CM | POA: Insufficient documentation

## 2014-02-20 DIAGNOSIS — Z8659 Personal history of other mental and behavioral disorders: Secondary | ICD-10-CM | POA: Insufficient documentation

## 2014-02-20 DIAGNOSIS — Z8719 Personal history of other diseases of the digestive system: Secondary | ICD-10-CM | POA: Insufficient documentation

## 2014-02-20 DIAGNOSIS — R05 Cough: Secondary | ICD-10-CM | POA: Diagnosis not present

## 2014-02-20 DIAGNOSIS — G8929 Other chronic pain: Secondary | ICD-10-CM | POA: Diagnosis not present

## 2014-02-20 DIAGNOSIS — Z8739 Personal history of other diseases of the musculoskeletal system and connective tissue: Secondary | ICD-10-CM | POA: Diagnosis not present

## 2014-02-20 DIAGNOSIS — I9589 Other hypotension: Secondary | ICD-10-CM | POA: Diagnosis not present

## 2014-02-20 DIAGNOSIS — Z720 Tobacco use: Secondary | ICD-10-CM | POA: Insufficient documentation

## 2014-02-20 DIAGNOSIS — Z8711 Personal history of peptic ulcer disease: Secondary | ICD-10-CM | POA: Diagnosis not present

## 2014-02-20 DIAGNOSIS — R188 Other ascites: Secondary | ICD-10-CM | POA: Diagnosis present

## 2014-02-20 DIAGNOSIS — Z79899 Other long term (current) drug therapy: Secondary | ICD-10-CM | POA: Insufficient documentation

## 2014-02-20 DIAGNOSIS — R0602 Shortness of breath: Secondary | ICD-10-CM | POA: Diagnosis not present

## 2014-02-20 DIAGNOSIS — Z7952 Long term (current) use of systemic steroids: Secondary | ICD-10-CM | POA: Insufficient documentation

## 2014-02-20 DIAGNOSIS — Z86010 Personal history of colonic polyps: Secondary | ICD-10-CM | POA: Insufficient documentation

## 2014-02-20 LAB — URINALYSIS, ROUTINE W REFLEX MICROSCOPIC
Bilirubin Urine: NEGATIVE
Glucose, UA: NEGATIVE mg/dL
Hgb urine dipstick: NEGATIVE
KETONES UR: NEGATIVE mg/dL
Leukocytes, UA: NEGATIVE
NITRITE: NEGATIVE
PROTEIN: NEGATIVE mg/dL
Specific Gravity, Urine: <1.005 — ABNORMAL LOW (ref 1.005–1.030)
Urobilinogen, UA: 0.2 mg/dL (ref 0.0–1.0)
pH: 6 (ref 5.0–8.0)

## 2014-02-20 LAB — CBC WITH DIFFERENTIAL/PLATELET
BASOS PCT: 1 % (ref 0–1)
Basophils Absolute: 0 10*3/uL (ref 0.0–0.1)
Eosinophils Absolute: 0.1 10*3/uL (ref 0.0–0.7)
Eosinophils Relative: 1 % (ref 0–5)
HCT: 30.3 % — ABNORMAL LOW (ref 36.0–46.0)
Hemoglobin: 10.5 g/dL — ABNORMAL LOW (ref 12.0–15.0)
Lymphocytes Relative: 29 % (ref 12–46)
Lymphs Abs: 1.8 10*3/uL (ref 0.7–4.0)
MCH: 34.1 pg — ABNORMAL HIGH (ref 26.0–34.0)
MCHC: 34.7 g/dL (ref 30.0–36.0)
MCV: 98.4 fL (ref 78.0–100.0)
MONOS PCT: 8 % (ref 3–12)
Monocytes Absolute: 0.5 10*3/uL (ref 0.1–1.0)
NEUTROS ABS: 3.9 10*3/uL (ref 1.7–7.7)
Neutrophils Relative %: 61 % (ref 43–77)
Platelets: 183 10*3/uL (ref 150–400)
RBC: 3.08 MIL/uL — ABNORMAL LOW (ref 3.87–5.11)
RDW: 16.4 % — ABNORMAL HIGH (ref 11.5–15.5)
WBC: 6.3 10*3/uL (ref 4.0–10.5)

## 2014-02-20 LAB — COMPREHENSIVE METABOLIC PANEL
ALT: 8 U/L (ref 0–35)
ANION GAP: 16 — AB (ref 5–15)
AST: 33 U/L (ref 0–37)
Albumin: 2.5 g/dL — ABNORMAL LOW (ref 3.5–5.2)
Alkaline Phosphatase: 197 U/L — ABNORMAL HIGH (ref 39–117)
BILIRUBIN TOTAL: 0.5 mg/dL (ref 0.3–1.2)
BUN: 3 mg/dL — AB (ref 6–23)
CO2: 22 meq/L (ref 19–32)
CREATININE: 0.74 mg/dL (ref 0.50–1.10)
Calcium: 8.9 mg/dL (ref 8.4–10.5)
Chloride: 93 mEq/L — ABNORMAL LOW (ref 96–112)
Glucose, Bld: 86 mg/dL (ref 70–99)
Potassium: 3.3 mEq/L — ABNORMAL LOW (ref 3.7–5.3)
Sodium: 131 mEq/L — ABNORMAL LOW (ref 137–147)
Total Protein: 7.3 g/dL (ref 6.0–8.3)

## 2014-02-20 LAB — PRO B NATRIURETIC PEPTIDE: Pro B Natriuretic peptide (BNP): 154.7 pg/mL — ABNORMAL HIGH (ref 0–125)

## 2014-02-20 LAB — TROPONIN I: Troponin I: <0.3 ng/mL (ref <0.30)

## 2014-02-20 LAB — PROTIME-INR
INR: 1.17 (ref 0.00–1.49)
Prothrombin Time: 15.1 seconds (ref 11.6–15.2)

## 2014-02-20 MED ORDER — HYDROMORPHONE HCL 1 MG/ML IJ SOLN
0.5000 mg | Freq: Once | INTRAMUSCULAR | Status: AC
  Administered 2014-02-20: 0.5 mg via INTRAMUSCULAR
  Filled 2014-02-20: qty 1

## 2014-02-20 MED ORDER — MORPHINE SULFATE 15 MG PO TABS: 15.0000 mg | ORAL_TABLET | Freq: Four times a day (QID) | ORAL | Status: DC

## 2014-02-20 MED ORDER — HYDROMORPHONE HCL 1 MG/ML IJ SOLN
1.0000 mg | Freq: Once | INTRAMUSCULAR | Status: AC
  Administered 2014-02-20: 1 mg via INTRAVENOUS
  Filled 2014-02-20: qty 1

## 2014-02-20 MED ORDER — HYDROMORPHONE HCL 1 MG/ML IJ SOLN
0.5000 mg | Freq: Once | INTRAMUSCULAR | Status: DC
  Filled 2014-02-20: qty 1

## 2014-02-20 NOTE — ED Provider Notes (Signed)
CSN: [id number]     Arrival date & time 02/20/14  1800 History   First MD Initiated Contact with Patient 02/20/14 1917     Chief Complaint  Patient presents with  . Ascites     (Consider location/radiation/quality/duration/timing/severity/associated sxs/prior Treatment) HPI 53 year old female with history of ascites, chronic abdominal and back pain, ran out of her oxycodone, chronic edema, history of alcohol use, history of hepatitis C, several weeks ago had a large volume paracentesis which helped to decrease her abdominal girth temporarily however over the last several weeks since discharge she has had gradual recurrence of abdominal enlargement with gradually worsening bilateral leg edema despite taking her diuretics as directed, she also has had a cough intermittently for the last week, she has no chest pain no shortness of breath however her abdominal girth makes it feel as if it's hard to take a deep breath at times, she has no fever no vomiting no diarrhea no confusion, she started seeing a new GI doctor several weeks ago and would like a large volume paracentesis in the emergency department for symptomatic improvement of her chronic abdominal pain and distention from ascites. Past Medical History  Diagnosis Date  . Guillain-Barre   . Blind   . Neuropathy   . Thyroid disease   . Back pain   . Arthritis   . Seizures   . UTI (lower urinary tract infection)   . Anxiety   . Depression   . DDD (degenerative disc disease), lumbar   . Hepatitis C antibody test positive     HCV RNA is negative 03/2013  . Mild diastolic dysfunction 5/39/7673  . Esophagitis, erosive 03/2014  . Hepatic steatosis 04/23/2013  . Hypomagnesemia 04/23/2013  . Cirrhosis of liver without mention of alcohol     ct on 06/06/13= Subtle nodular contour of the liver. Cirrhosis cannot be excluded.   . PUD (peptic ulcer disease)   . Tubular adenoma   . Chronic abdominal pain   . Drug-seeking behavior     per [facility] admission records 01/04/14  . Chronic hypotension   . History of ETOH abuse    Past Surgical History  Procedure Laterality Date  . Cholecystectomy    . Tubal ligation    . Femur fracture surgery    . Ankle surgery    . Back surgery    . Esophagogastroduodenoscopy N/A 03/21/2013    Dr. Gala Romney: severe erosive reflux esophagitis, hiatal hernia, gastric ulcer with element of partial gastric outlet obstruction secondary to ulcer effect. Likely r/t NSAIDs. negative H.pylori  . Colonoscopy with esophagogastroduodenoscopy (egd) N/A 06/24/2013    Dr. Gala Romney: Erosive reflux esophagitis-much improved over that seen previously. Hiatal hernia. Previously noted gastric ulcer completely healed.  Portal gastropathy/TCS:Rectal varices/anal canal hemorrhoids. Portal colopathy.Colonic diverticulosis. Colonic polyp--tubular adenoma   Family History  Problem Relation Age of Onset  . Colon cancer Neg Hx   . Kidney disease Mother    History  Substance Use Topics  . Smoking status: Current Every Day Smoker -- 0.50 packs/day    Types: Cigarettes  . Smokeless tobacco: Not on file  . Alcohol Use: No     Comment: hx of ETOH abuse, none in 6 months   OB History    No data available     Review of Systems 10 Systems reviewed and are negative for acute change except as noted in the HPI.   Allergies  Neurontin; Acetaminophen; Aspirin; Eggs or egg-derived products; Tramadol; and Codeine  Home Medications  Prior to Admission medications   Medication Sig Start Date End Date Taking? Authorizing Provider  cyanocobalamin (,VITAMIN B-12,) 1000 MCG/ML injection Inject 1,000 mcg into the muscle every 30 (thirty) days. Sunday   Yes Historical Provider, MD  furosemide (LASIX) 40 MG tablet Take 1 tablet (40 mg total) by mouth every morning. 01/13/14  Yes Donne Hazel, MD  hydrocortisone (ANUSOL-HC) 25 MG suppository Place 1 suppository (25 mg total) rectally 2 (two) times daily. 01/13/14  Yes Donne Hazel,  MD  ipratropium-albuterol (DUONEB) 0.5-2.5 (3) MG/3ML SOLN Take 3 mLs by nebulization every 6 (six) hours as needed (for shortness of breath).    Yes Historical Provider, MD  lactulose (CHRONULAC) 10 GM/15ML solution Take 20 g by mouth 2 (two) times daily.   Yes Historical Provider, MD  levothyroxine (SYNTHROID, LEVOTHROID) 50 MCG tablet Take 50 mcg by mouth daily before breakfast.   Yes Historical Provider, MD  pantoprazole (PROTONIX) 40 MG tablet Take 1 tablet (40 mg total) by mouth 2 (two) times daily before a meal. 03/26/13  Yes Samuella Cota, MD  potassium chloride (K-DUR,KLOR-CON) 10 MEQ tablet Take 10 mEq by mouth 4 (four) times daily.   Yes Historical Provider, MD  pregabalin (LYRICA) 50 MG capsule Take 50-100 mg by mouth 2 (two) times daily. 1 in the morning and two at bedtime   Yes Historical Provider, MD  thiamine 100 MG tablet Take 100 mg by mouth daily.   Yes Historical Provider, MD  cephALEXin (KEFLEX) 500 MG capsule Take 1 capsule (500 mg total) by mouth 2 (two) times daily. Patient not taking: Reported on 02/20/2014 01/14/14   Donne Hazel, MD  lactulose (CHRONULAC) 10 GM/15ML solution TAKE TWO TABLESPOONSFUL (30 ML) BY MOUTH TWICE DAILY Patient not taking: Reported on 02/20/2014 12/15/13   Orvil Feil, NP  midodrine (PROAMATINE) 5 MG tablet Take 1 tablet (5 mg total) by mouth 3 (three) times daily with meals. Patient not taking: Reported on 02/20/2014 01/13/14   Donne Hazel, MD  morphine (MSIR) 15 MG tablet Take 1 tablet (15 mg total) by mouth 4 (four) times daily. 02/20/14   Babette Relic, MD  sennosides-docusate sodium (SENOKOT-S) 8.6-50 MG tablet Take 2 tablets by mouth daily.    Historical Provider, MD   BP 94/70 mmHg  Pulse 74  Temp(Src) 97.8 F (36.6 C) (Oral)  Resp 18  Ht 5\' 4"  (1.626 m)  Wt 175 lb (79.379 kg)  BMI 30.02 kg/m2  SpO2 97% Physical Exam  Constitutional:  Awake, alert, nontoxic appearance.  HENT:  Head: Atraumatic.  Eyes: Right eye exhibits no  discharge. Left eye exhibits no discharge.  Neck: Neck supple.  Cardiovascular: Normal rate and regular rhythm.   No murmur heard. Pulmonary/Chest: Effort normal and breath sounds normal. No respiratory distress. She has no wheezes. She has no rales. She exhibits no tenderness.  Pulse oximetry normal room air 100%  Abdominal: Soft. Bowel sounds are normal. She exhibits distension. She exhibits no mass. There is tenderness. There is no rebound and no guarding.  Mild diffuse tenderness without rebound  Musculoskeletal: She exhibits edema. She exhibits no tenderness.  Baseline ROM, no obvious new focal weakness.mild to moderate edema both lower legs with stasis dermatitis without acute cellulitis noted.  Neurological: She is alert.  Mental status and motor strength appears baseline for patient and situation.  Skin: No rash noted.  Psychiatric: She has a normal mood and affect.  Nursing note and vitals reviewed.   ED Course  Procedures (including critical care time) I discussed this case with the patient's gastroenterologist in [facility] Dr. Britta Mccreedy and I do not think the patient needs emergency large volume paracentesis tonight and believe she is stable for discharge home to consider outpatient large-volume repeat paracentesis the patient's doctor agrees. [place]  CBC WITH DIFFERENTIAL - Abnormal; Notable for the following:    RBC 3.08 (*)    Hemoglobin 10.5 (*)    HCT 30.3 (*)    MCH 34.1 (*)    RDW 16.4 (*)    All other components within normal limits  COMPREHENSIVE METABOLIC PANEL - Abnormal; Notable for the following:    Sodium 131 (*)    Potassium 3.3 (*)    Chloride 93 (*)    BUN 3 (*)    Albumin 2.5 (*)    Alkaline Phosphatase 197 (*)    Anion gap 16 (*)    All other components within normal limits  URINALYSIS, ROUTINE W REFLEX MICROSCOPIC - Abnormal; Notable for the following:    Specific Gravity, Urine <1.005 (*)    All other components within normal  limits  PRO B NATRIURETIC PEPTIDE - Abnormal; Notable for the following:    Pro B Natriuretic peptide (BNP) 154.7 (*)    All other components within normal limits  PROTIME-INR  TROPONIN I    Imaging Review No results found.   EKG Interpretation   Date/Time:  Monday February 20 2014 20:11:40 EST Ventricular Rate:  91 PR Interval:  108 QRS Duration: 127 QT Interval:  433 QTC Calculation: 533 R Axis:   2 Text Interpretation:  Sinus rhythm Artifact No significant change since  last tracing Confirmed by [facility]  MD, Jenny Reichmann ([id number]) on 02/20/2014 8:45:00 PM      MDM   Final diagnoses:  Shortness of breath  Cough  Ascites  Chronic pain    Patient / Family / Caregiver informed of clinical course, understand medical decision-making process, and agree with plan. I doubt any other EMC precluding discharge at this time including, but not necessarily limited to the following:SBI.    Babette Relic, MD 03/09/14 [phone]

## 2014-02-20 NOTE — ED Notes (Signed)
Pt states that she has been having worstening in her edema over past week end and is also having leaking from her legs . Pt states that she took some cough (vicks) before she came in for a cough that she has also had for about a week

## 2014-02-20 NOTE — ED Notes (Signed)
Pt requesting one more dose of IV pain medication before she leaves

## 2014-02-20 NOTE — Discharge Instructions (Signed)
Abdominal (belly) pain can be caused by many things. Your caregiver performed an examination and possibly ordered blood/urine tests and imaging (CT scan, x-rays, ultrasound). Many cases can be observed and treated at home after initial evaluation in the emergency department. Even though you are being discharged home, abdominal pain can be unpredictable. Therefore, you need a repeated exam if your pain does not resolve, returns, or worsens. Most patients with abdominal pain don't have to be admitted to the hospital or have surgery, but serious problems like appendicitis and gallbladder attacks can start out as nonspecific pain. Many abdominal conditions cannot be diagnosed in one visit, so follow-up evaluations are very important. °SEEK IMMEDIATE MEDICAL ATTENTION IF: °The pain does not go away or becomes severe.  °A temperature above 101 develops.  °Repeated vomiting occurs (multiple episodes).  °The pain becomes localized to portions of the abdomen. The right side could possibly be appendicitis. In an adult, the left lower portion of the abdomen could be colitis or diverticulitis.  °Blood is being passed in stools or vomit (bright red or black tarry stools).  °Return also if you develop chest pain, difficulty breathing, dizziness or fainting, or become confused, poorly responsive, or inconsolable (young children). ° °Chronic Pain Discharge Instructions  °Emergency care providers appreciate that many patients coming to us are in severe pain and we wish to address their pain in the safest, most responsible manner.  It is important to recognize however, that the proper treatment of chronic pain differs from that of the pain of injuries and acute illnesses.  Our goal is to provide quality, safe, personalized care and we thank you for giving us the opportunity to serve you. °The use of narcotics and related agents for chronic pain syndromes may lead to additional physical and psychological problems.  Nearly as many  people die from prescription narcotics each year as die from car crashes.  Additionally, this risk is increased if such prescriptions are obtained from a variety of sources.  Therefore, only your primary care physician or a pain management specialist is able to safely treat such syndromes with narcotic medications long-term.   ° °Documentation revealing such prescriptions have been sought from multiple sources may prohibit us from providing a refill or different narcotic medication.  Your name may be checked first through the [place] Controlled Substances Reporting System.  This database is a record of controlled substance medication prescriptions that the patient has received.  This has been established by [place] in an effort to eliminate the dangerous, and often life threatening, practice of obtaining multiple prescriptions from different medical providers.  ° °If you have a chronic pain syndrome (i.e. chronic headaches, recurrent back or neck pain, dental pain, abdominal or pelvis pain without a specific diagnosis, or neuropathic pain such as fibromyalgia) or recurrent visits for the same condition without an acute diagnosis, you may be treated with non-narcotics and other non-addictive medicines.  Allergic reactions or negative side effects that may be reported by a patient to such medications will not typically lead to the use of a narcotic analgesic or other controlled substance as an alternative. °  °Patients managing chronic pain with a personal physician should have provisions in place for breakthrough pain.  If you are in crisis, you should call your physician.  If your physician directs you to the emergency department, please have the doctor call and speak to our attending physician concerning your care. °  °When patients come to the Emergency Department (ED) with acute   medical conditions in which the Emergency Department physician feels appropriate to prescribe narcotic or sedating pain  medication, the physician will prescribe these in very limited quantities.  The amount of these medications will last only until you can see your primary care physician in his/her office.  Any patient who returns to the ED seeking refills should expect only non-narcotic pain medications.  ° °In the event of an acute medical condition exists and the emergency physician feels it is necessary that the patient be given a narcotic or sedating medication -  a responsible adult driver should be present in the room prior to the medication being given by the nurse. °  °Prescriptions for narcotic or sedating medications that have been lost, stolen or expired will not be refilled in the Emergency Department.   ° °Patients who have chronic pain may receive non-narcotic prescriptions until seen by their primary care physician.  It is every patient’s personal responsibility to maintain active prescriptions with his or her primary care physician or specialist. °

## 2014-03-14 DIAGNOSIS — K2210 Ulcer of esophagus without bleeding: Secondary | ICD-10-CM

## 2014-03-14 HISTORY — DX: Ulcer of esophagus without bleeding: K22.10

## 2014-04-28 ENCOUNTER — Encounter (HOSPITAL_COMMUNITY): Payer: Self-pay | Admitting: *Deleted

## 2014-04-28 ENCOUNTER — Emergency Department (HOSPITAL_COMMUNITY): Payer: Medicare Other

## 2014-04-28 ENCOUNTER — Inpatient Hospital Stay (HOSPITAL_COMMUNITY)
Admission: EM | Admit: 2014-04-28 | Discharge: 2014-05-01 | DRG: 853 | Disposition: A | Discharge status: Hospice | Payer: Medicare Other | Attending: Internal Medicine | Admitting: Internal Medicine

## 2014-04-28 DIAGNOSIS — K760 Fatty (change of) liver, not elsewhere classified: Secondary | ICD-10-CM | POA: Diagnosis present

## 2014-04-28 DIAGNOSIS — F419 Anxiety disorder, unspecified: Secondary | ICD-10-CM | POA: Diagnosis present

## 2014-04-28 DIAGNOSIS — I9589 Other hypotension: Secondary | ICD-10-CM | POA: Diagnosis present

## 2014-04-28 DIAGNOSIS — E039 Hypothyroidism, unspecified: Secondary | ICD-10-CM | POA: Diagnosis present

## 2014-04-28 DIAGNOSIS — E860 Dehydration: Secondary | ICD-10-CM | POA: Diagnosis present

## 2014-04-28 DIAGNOSIS — M5136 Other intervertebral disc degeneration, lumbar region: Secondary | ICD-10-CM | POA: Diagnosis present

## 2014-04-28 DIAGNOSIS — Z91012 Allergy to eggs: Secondary | ICD-10-CM | POA: Diagnosis not present

## 2014-04-28 DIAGNOSIS — G8929 Other chronic pain: Secondary | ICD-10-CM | POA: Diagnosis present

## 2014-04-28 DIAGNOSIS — Z79899 Other long term (current) drug therapy: Secondary | ICD-10-CM | POA: Diagnosis not present

## 2014-04-28 DIAGNOSIS — D500 Iron deficiency anemia secondary to blood loss (chronic): Secondary | ICD-10-CM

## 2014-04-28 DIAGNOSIS — K769 Liver disease, unspecified: Secondary | ICD-10-CM | POA: Diagnosis present

## 2014-04-28 DIAGNOSIS — M1990 Unspecified osteoarthritis, unspecified site: Secondary | ICD-10-CM | POA: Diagnosis present

## 2014-04-28 DIAGNOSIS — Z885 Allergy status to narcotic agent status: Secondary | ICD-10-CM | POA: Diagnosis not present

## 2014-04-28 DIAGNOSIS — R188 Other ascites: Secondary | ICD-10-CM

## 2014-04-28 DIAGNOSIS — B1920 Unspecified viral hepatitis C without hepatic coma: Secondary | ICD-10-CM | POA: Diagnosis present

## 2014-04-28 DIAGNOSIS — Z886 Allergy status to analgesic agent status: Secondary | ICD-10-CM | POA: Diagnosis not present

## 2014-04-28 DIAGNOSIS — D649 Anemia, unspecified: Secondary | ICD-10-CM | POA: Diagnosis present

## 2014-04-28 DIAGNOSIS — Z9049 Acquired absence of other specified parts of digestive tract: Secondary | ICD-10-CM | POA: Diagnosis present

## 2014-04-28 DIAGNOSIS — Z720 Tobacco use: Secondary | ICD-10-CM | POA: Diagnosis not present

## 2014-04-28 DIAGNOSIS — D126 Benign neoplasm of colon, unspecified: Secondary | ICD-10-CM | POA: Diagnosis present

## 2014-04-28 DIAGNOSIS — Z888 Allergy status to other drugs, medicaments and biological substances status: Secondary | ICD-10-CM

## 2014-04-28 DIAGNOSIS — R569 Unspecified convulsions: Secondary | ICD-10-CM | POA: Diagnosis present

## 2014-04-28 DIAGNOSIS — R109 Unspecified abdominal pain: Secondary | ICD-10-CM | POA: Diagnosis present

## 2014-04-28 DIAGNOSIS — K7040 Alcoholic hepatic failure without coma: Secondary | ICD-10-CM | POA: Diagnosis present

## 2014-04-28 DIAGNOSIS — Z8711 Personal history of peptic ulcer disease: Secondary | ICD-10-CM

## 2014-04-28 DIAGNOSIS — K7031 Alcoholic cirrhosis of liver with ascites: Secondary | ICD-10-CM | POA: Diagnosis present

## 2014-04-28 DIAGNOSIS — N19 Unspecified kidney failure: Secondary | ICD-10-CM | POA: Diagnosis present

## 2014-04-28 DIAGNOSIS — Z66 Do not resuscitate: Secondary | ICD-10-CM | POA: Diagnosis present

## 2014-04-28 DIAGNOSIS — F329 Major depressive disorder, single episode, unspecified: Secondary | ICD-10-CM | POA: Diagnosis present

## 2014-04-28 DIAGNOSIS — Z515 Encounter for palliative care: Secondary | ICD-10-CM | POA: Diagnosis not present

## 2014-04-28 DIAGNOSIS — N390 Urinary tract infection, site not specified: Secondary | ICD-10-CM | POA: Diagnosis present

## 2014-04-28 DIAGNOSIS — K767 Hepatorenal syndrome: Secondary | ICD-10-CM | POA: Diagnosis present

## 2014-04-28 DIAGNOSIS — R531 Weakness: Secondary | ICD-10-CM

## 2014-04-28 DIAGNOSIS — E872 Acidosis, unspecified: Secondary | ICD-10-CM | POA: Diagnosis present

## 2014-04-28 DIAGNOSIS — K7460 Unspecified cirrhosis of liver: Secondary | ICD-10-CM | POA: Diagnosis present

## 2014-04-28 DIAGNOSIS — Z79891 Long term (current) use of opiate analgesic: Secondary | ICD-10-CM | POA: Diagnosis not present

## 2014-04-28 DIAGNOSIS — N179 Acute kidney failure, unspecified: Secondary | ICD-10-CM | POA: Diagnosis present

## 2014-04-28 DIAGNOSIS — G610 Guillain-Barre syndrome: Secondary | ICD-10-CM | POA: Diagnosis present

## 2014-04-28 DIAGNOSIS — A419 Sepsis, unspecified organism: Principal | ICD-10-CM | POA: Diagnosis present

## 2014-04-28 DIAGNOSIS — E7220 Disorder of urea cycle metabolism, unspecified: Secondary | ICD-10-CM

## 2014-04-28 DIAGNOSIS — B9689 Other specified bacterial agents as the cause of diseases classified elsewhere: Secondary | ICD-10-CM | POA: Diagnosis present

## 2014-04-28 DIAGNOSIS — F1021 Alcohol dependence, in remission: Secondary | ICD-10-CM | POA: Diagnosis present

## 2014-04-28 DIAGNOSIS — L89152 Pressure ulcer of sacral region, stage 2: Secondary | ICD-10-CM | POA: Diagnosis present

## 2014-04-28 DIAGNOSIS — H540 Blindness, both eyes: Secondary | ICD-10-CM | POA: Diagnosis present

## 2014-04-28 DIAGNOSIS — J449 Chronic obstructive pulmonary disease, unspecified: Secondary | ICD-10-CM | POA: Diagnosis present

## 2014-04-28 LAB — CBC WITH DIFFERENTIAL/PLATELET
Basophils Absolute: 0 10*3/uL (ref 0.0–0.1)
Basophils Relative: 1 % (ref 0–1)
EOS ABS: 0.1 10*3/uL (ref 0.0–0.7)
EOS PCT: 2 % (ref 0–5)
HCT: 22.6 % — ABNORMAL LOW (ref 36.0–46.0)
HEMOGLOBIN: 7.8 g/dL — AB (ref 12.0–15.0)
LYMPHS ABS: 1.2 10*3/uL (ref 0.7–4.0)
Lymphocytes Relative: 22 % (ref 12–46)
MCH: 33.6 pg (ref 26.0–34.0)
MCHC: 34.5 g/dL (ref 30.0–36.0)
MCV: 97.4 fL (ref 78.0–100.0)
Monocytes Absolute: 0.5 10*3/uL (ref 0.1–1.0)
Monocytes Relative: 9 % (ref 3–12)
NEUTROS PCT: 66 % (ref 43–77)
Neutro Abs: 3.6 10*3/uL (ref 1.7–7.7)
Platelets: 117 10*3/uL — ABNORMAL LOW (ref 150–400)
RBC: 2.32 MIL/uL — ABNORMAL LOW (ref 3.87–5.11)
RDW: 15 % (ref 11.5–15.5)
WBC: 5.5 10*3/uL (ref 4.0–10.5)

## 2014-04-28 LAB — COMPREHENSIVE METABOLIC PANEL
ALBUMIN: 2.4 g/dL — AB (ref 3.5–5.2)
ALK PHOS: 110 U/L (ref 39–117)
ALT: 14 U/L (ref 0–35)
ANION GAP: 14 (ref 5–15)
AST: 33 U/L (ref 0–37)
BUN: 56 mg/dL — ABNORMAL HIGH (ref 6–23)
CHLORIDE: 102 meq/L (ref 96–112)
CO2: 17 mmol/L — ABNORMAL LOW (ref 19–32)
CREATININE: 3.44 mg/dL — AB (ref 0.50–1.10)
Calcium: 8.8 mg/dL (ref 8.4–10.5)
GFR calc non Af Amer: 14 mL/min — ABNORMAL LOW (ref >90)
GFR, EST AFRICAN AMERICAN: 16 mL/min — AB (ref >90)
Glucose, Bld: 104 mg/dL — ABNORMAL HIGH (ref 70–99)
Potassium: 4.1 mmol/L (ref 3.5–5.1)
Sodium: 133 mmol/L — ABNORMAL LOW (ref 135–145)
TOTAL PROTEIN: 6.3 g/dL (ref 6.0–8.3)
Total Bilirubin: 0.9 mg/dL (ref 0.3–1.2)

## 2014-04-28 LAB — URINE MICROSCOPIC-ADD ON

## 2014-04-28 LAB — URINALYSIS, ROUTINE W REFLEX MICROSCOPIC
Bilirubin Urine: NEGATIVE
Glucose, UA: NEGATIVE mg/dL
KETONES UR: NEGATIVE mg/dL
Nitrite: NEGATIVE
PROTEIN: NEGATIVE mg/dL
SPECIFIC GRAVITY, URINE: 1.015 (ref 1.005–1.030)
Urobilinogen, UA: 0.2 mg/dL (ref 0.0–1.0)
pH: 5.5 (ref 5.0–8.0)

## 2014-04-28 LAB — LIPASE, BLOOD: Lipase: 25 U/L (ref 11–59)

## 2014-04-28 LAB — TYPE AND SCREEN
ABO/RH(D): B POS
Antibody Screen: NEGATIVE

## 2014-04-28 LAB — PROTIME-INR
INR: 1.18 (ref 0.00–1.49)
Prothrombin Time: 15.1 seconds (ref 11.6–15.2)

## 2014-04-28 LAB — AMMONIA: AMMONIA: 92 umol/L — AB (ref 11–32)

## 2014-04-28 LAB — I-STAT CG4 LACTIC ACID, ED
LACTIC ACID, VENOUS: 3.14 mmol/L — AB (ref 0.5–2.2)
LACTIC ACID, VENOUS: 0.76 mmol/L (ref 0.5–2.2)

## 2014-04-28 MED ORDER — SODIUM CHLORIDE 0.9 % IV SOLN
INTRAVENOUS | Status: AC
  Administered 2014-04-28: 20:00:00 via INTRAVENOUS

## 2014-04-28 MED ORDER — IPRATROPIUM-ALBUTEROL 0.5-2.5 (3) MG/3ML IN SOLN
3.0000 mL | Freq: Once | RESPIRATORY_TRACT | Status: AC
  Administered 2014-04-28: 3 mL via RESPIRATORY_TRACT
  Filled 2014-04-28: qty 3

## 2014-04-28 MED ORDER — SODIUM CHLORIDE 0.9 % IV BOLUS (SEPSIS)
1000.0000 mL | Freq: Once | INTRAVENOUS | Status: AC
  Administered 2014-04-28: 1000 mL via INTRAVENOUS

## 2014-04-28 MED ORDER — SODIUM CHLORIDE 0.9 % IV BOLUS (SEPSIS)
500.0000 mL | Freq: Once | INTRAVENOUS | Status: AC
  Administered 2014-04-28: 1000 mL via INTRAVENOUS

## 2014-04-28 MED ORDER — VANCOMYCIN HCL IN DEXTROSE 1-5 GM/200ML-% IV SOLN
1000.0000 mg | INTRAVENOUS | Status: DC
Start: 2014-04-30 — End: 2014-04-29

## 2014-04-28 MED ORDER — SODIUM CHLORIDE 0.9 % IV SOLN: INTRAVENOUS | Status: DC

## 2014-04-28 MED ORDER — PIPERACILLIN-TAZOBACTAM 3.375 G IVPB 30 MIN
3.3750 g | Freq: Once | INTRAVENOUS | Status: AC
  Administered 2014-04-28: 3.375 g via INTRAVENOUS
  Filled 2014-04-28: qty 50

## 2014-04-28 MED ORDER — PIPERACILLIN-TAZOBACTAM IN DEX 2-0.25 GM/50ML IV SOLN
2.2500 g | Freq: Three times a day (TID) | INTRAVENOUS | Status: DC
  Administered 2014-04-29: 2.25 g via INTRAVENOUS
  Filled 2014-04-28 (×6): qty 50

## 2014-04-28 MED ORDER — VANCOMYCIN HCL IN DEXTROSE 1-5 GM/200ML-% IV SOLN
1000.0000 mg | Freq: Once | INTRAVENOUS | Status: AC
  Administered 2014-04-28: 1000 mg via INTRAVENOUS
  Filled 2014-04-28: qty 200

## 2014-04-28 NOTE — ED Notes (Signed)
Lab at bedside

## 2014-04-28 NOTE — ED Notes (Signed)
RT made aware of breathing tx. 

## 2014-04-28 NOTE — H&P (Signed)
PCP:   Celedonio Savage, MD   Chief Complaint:  Generalized weakness  HPI:  53 year old female who  has a past medical history of Guillain-Barre; Blind; Neuropathy; Thyroid disease; Back pain; Arthritis; Seizures; UTI (lower urinary tract infection); Anxiety; Depression; DDD (degenerative disc disease), lumbar; Hepatitis C antibody test positive; Mild diastolic dysfunction (5/63/8756); Esophagitis, erosive (03/2014); Hepatic steatosis (04/23/2013); Hypomagnesemia (04/23/2013); Cirrhosis of liver without mention of alcohol; PUD (peptic ulcer disease); Tubular adenoma; Chronic abdominal pain; Drug-seeking behavior; Chronic hypotension; and History of ETOH abuse. Patient was brought to the hospital today for fatigue and generalized weakness, decreased urine output. Patient has history of liver cirrhosis, ascites and has been on Lasix 80 mg in the morning and 40 at bedtime. Over the past few days patient experienced more fatigue, general is weakness, gait imbalance. Her appetite has been fair, she had one episode of vomiting today. Denies any fever no chest pain or shortness of breath. No dysuria urgency frequency of urination. In the ED patient was found to be extremely dehydrated with creatinine 3.44, her baseline creatinine in November was 0.74. Also patient was found to have elevated lactic acid 3.14 which surprisingly became normal 0.76 after it was rechecked in 2 hours, after patient was given IV fluids. Patient also had mildly abnormal UA, started on vancomycin and Zosyn empirically for possible sepsis. Patient has ammonia level of 92, she is alert and oriented 3, but has been mildly confused at home as per patient's husband at bedside.  Allergies:   Allergies  Allergen Reactions  . Neurontin [Gabapentin] Anaphylaxis  . Acetaminophen Itching and Nausea And Vomiting  . Aspirin     Intolerance because of peptic ulcer  . Eggs Or Egg-Derived Products Nausea And Vomiting  . Tramadol     Nausea and  vomiting  . Codeine Itching and Nausea And Vomiting    Patient is able to take morphine, demerol and fentanyl with out any type of side effects      Past Medical History  Diagnosis Date  . Guillain-Barre   . Blind   . Neuropathy   . Thyroid disease   . Back pain   . Arthritis   . Seizures   . UTI (lower urinary tract infection)   . Anxiety   . Depression   . DDD (degenerative disc disease), lumbar   . Hepatitis C antibody test positive     HCV RNA is negative 03/2013  . Mild diastolic dysfunction 4/33/2951  . Esophagitis, erosive 03/2014  . Hepatic steatosis 04/23/2013  . Hypomagnesemia 04/23/2013  . Cirrhosis of liver without mention of alcohol     ct on 06/06/13= Subtle nodular contour of the liver. Cirrhosis cannot be excluded.   . PUD (peptic ulcer disease)   . Tubular adenoma   . Chronic abdominal pain   . Drug-seeking behavior     per [facility] admission records 01/04/14  . Chronic hypotension   . History of ETOH abuse     Past Surgical History  Procedure Laterality Date  . Cholecystectomy    . Tubal ligation    . Femur fracture surgery    . Ankle surgery    . Back surgery    . Esophagogastroduodenoscopy N/A 03/21/2013    Dr. Gala Romney: severe erosive reflux esophagitis, hiatal hernia, gastric ulcer with element of partial gastric outlet obstruction secondary to ulcer effect. Likely r/t NSAIDs. negative H.pylori  . Colonoscopy with esophagogastroduodenoscopy (egd) N/A 06/24/2013    Dr. Gala Romney: Erosive reflux esophagitis-much improved over  that seen previously. Hiatal hernia. Previously noted gastric ulcer completely healed.  Portal gastropathy/TCS:Rectal varices/anal canal hemorrhoids. Portal colopathy.Colonic diverticulosis. Colonic polyp--tubular adenoma    Prior to Admission medications   Medication Sig Start Date End Date Taking? Authorizing Provider  cephALEXin (KEFLEX) 500 MG capsule Take 1 capsule (500 mg total) by mouth 2 (two) times daily. Patient  taking differently: Take 500 mg by mouth 3 (three) times daily. 10 day course starting on 04/26/2014 01/14/14  Yes Donne Hazel, MD  clotrimazole (LOTRIMIN) 1 % cream Apply 1 application topically 2 (two) times daily.   Yes Historical Provider, MD  cyanocobalamin (,VITAMIN B-12,) 1000 MCG/ML injection Inject 1,000 mcg into the muscle every 30 (thirty) days. Sunday   Yes Historical Provider, MD  furosemide (LASIX) 40 MG tablet Take 1 tablet (40 mg total) by mouth every morning. Patient taking differently: Take 40-80 mg by mouth 2 (two) times daily. 80mg  in the morning and 40mg  at bedtime 01/13/14  Yes Donne Hazel, MD  ipratropium-albuterol (DUONEB) 0.5-2.5 (3) MG/3ML SOLN Take 3 mLs by nebulization every 6 (six) hours as needed (for shortness of breath).    Yes Historical Provider, MD  lactulose (CHRONULAC) 10 GM/15ML solution TAKE TWO TABLESPOONSFUL (30 ML) BY MOUTH TWICE DAILY 12/15/13  Yes Orvil Feil, NP  levothyroxine (SYNTHROID, LEVOTHROID) 50 MCG tablet Take 50 mcg by mouth daily before breakfast.   Yes Historical Provider, MD  pantoprazole (PROTONIX) 40 MG tablet Take 1 tablet (40 mg total) by mouth 2 (two) times daily before a meal. 03/26/13  Yes Samuella Cota, MD  pregabalin (LYRICA) 50 MG capsule Take 50-100 mg by mouth 2 (two) times daily. 1 in the morning and two at bedtime   Yes Historical Provider, MD  spironolactone (ALDACTONE) 100 MG tablet Take 100 mg by mouth every morning.   Yes Historical Provider, MD  thiamine 100 MG tablet Take 100 mg by mouth daily.   Yes Historical Provider, MD  hydrocortisone (ANUSOL-HC) 25 MG suppository Place 1 suppository (25 mg total) rectally 2 (two) times daily. Patient not taking: Reported on 04/28/2014 01/13/14   Donne Hazel, MD  morphine (MSIR) 15 MG tablet Take 1 tablet (15 mg total) by mouth 4 (four) times daily. 02/20/14   Babette Relic, MD    Social History:  reports that she has been smoking Cigarettes.  She has been smoking about 0.50  packs per day. She does not have any smokeless tobacco history on file. She reports that she does not drink alcohol or use illicit drugs.  Family History  Problem Relation Age of Onset  . Colon cancer Neg Hx   . Kidney disease Mother      All the positives are listed in BOLD  Review of Systems:  HEENT: Headache, blurred vision, runny nose, sore throat Neck: Hypothyroidism, hyperthyroidism,,lymphadenopathy Chest : Shortness of breath, history of COPD, Asthma Heart : Chest pain, history of coronary arterey disease GI:  Nausea, vomiting, diarrhea, constipation, GERD GU: Dysuria, urgency, frequency of urination, hematuria Neuro: Stroke, seizures, syncope Psych: Depression, anxiety, hallucinations   Physical Exam: Blood pressure 90/74, pulse 89, temperature 97.9 F (36.6 C), temperature source Oral, resp. rate 22, height 5\' 4"  (1.626 m), weight 72.576 kg (160 lb), SpO2 97 %. Constitutional:   Patient is a well-developed and well-nourished *female in no acute distress and cooperative with exam. Head: Normocephalic and atraumatic Mouth: Mucus membranes moist Eyes: PERRL, EOMI, conjunctivae normal Neck: Supple, No Thyromegaly Cardiovascular: RRR, S1 normal, S2  normal Pulmonary/Chest: CTAB, no wheezes, rales, or rhonchi Abdominal: Soft. Non-tender, distended, bowel sounds are normal, no masses, organomegaly, or guarding present.  Skin - stage II ulcer noted on the sacrum. Neurological: A&O x3, Strength is normal and symmetric bilaterally, cranial nerve II-XII are grossly intact, no focal motor deficit, sensory intact to light touch bilaterally.  Extremities : No Cyanosis, Clubbing or trace edema of the lower extremities  Labs on Admission:  Basic Metabolic Panel:  Recent Labs Lab 04/28/14 1745  NA 133*  K 4.1  CL 102  CO2 17*  GLUCOSE 104*  BUN 56*  CREATININE 3.44*  CALCIUM 8.8   Liver Function Tests:  Recent Labs Lab 04/28/14 1745  AST 33  ALT 14  ALKPHOS 110    BILITOT 0.9  PROT 6.3  ALBUMIN 2.4*    Recent Labs Lab 04/28/14 1745  LIPASE 25    Recent Labs Lab 04/28/14 1740  AMMONIA 92*   CBC:  Recent Labs Lab 04/28/14 1745  WBC 5.5  NEUTROABS 3.6  HGB 7.8*  HCT 22.6*  MCV 97.4  PLT 117*   Cardiac Enzymes: No results for input(s): CKTOTAL, CKMB, CKMBINDEX, TROPONINI in the last 168 hours.  BNP (last 3 results)  Recent Labs  10/30/13 1612 02/20/14 1900  PROBNP 111.9 154.7*   CBG: No results for input(s): GLUCAP in the last 168 hours.  Radiological Exams on Admission: Dg Chest Port 1 View  04/28/2014   CLINICAL DATA:  Weakness, shortness of breath and confusion for several days.  EXAM: PORTABLE CHEST - 1 VIEW  COMPARISON:  Single view of the dens 02/20/2014 and 01/11/2014.  FINDINGS: Lungs are clear. Heart size is normal. There is no pneumothorax or pleural effusion.  IMPRESSION: Negative exam.   Electronically Signed   By: Inge Rise M.D.   On: 04/28/2014 18:50    EKG: Independently reviewed. Normal sinus rhythm   Assessment/Plan Active Problems:   Metabolic acidosis   UTI (urinary tract infection)   probable Cirrhosis of liver   Chronic liver disease   Ascites   Anemia   Renal failure   AKI (acute kidney injury)   Sepsis  Sepsis Patient presenting with hypotension, mild confusion, elevated lactic acid started on vancomycin and Zosyn empirically for possible sepsis. Blood cultures and urine cultures have been obtained. Urine shows too numerous to count WBCs with negative nitrite. Will continue vancomycin and Zosyn at this time, antibiotics can be tapered based on the urine culture results.  Ascites Patient has tense ascites, will order ultrasound-guided paracentesis in a.m. Will send the fluid for analysis for Gram stain and culture. Follow the culture results  Acute kidney injury Patient has been on high-dose Lasix at home, will discontinue Lasix at this time, and start gentle IV hydration with  normal saline 75 per hour.  Elevated ammonia Patient's level of ammonia was 92, though she is alert and oriented 3. I will hold lactulose at this time due to the acute kidney injury and hypotension, once patient's blood pressure improves she can be restarted on lactulose. Will check the level of ammonia in a.m.  Anemia Hemoglobin is 7.8, patient's baseline and no globe and is around 9- 10. Will check CBC in a.m. Will check stool for occult blood.  Chronic liver disease/liver cirrhosis Patient has liver cirrhosis, hold*spironolactone Lasix at this time due to hypotension.  Stage II decubitus ulcer Does not appear to be infected Air overlay mattress, skin care per nursing  COPD Patient has COPD, will continue  with DuoNeb nebulizers every 6 hours when necessary  Hypothyroidism Continue Synthroid  DVT prophylaxis SCDs   Patient was transferred to [facility] for ultrasound-guided paracentesis in a.m., no IR available over the weekend at [place]. Dr. Jana Hakim has accepted the patient  Code status: Patient is full code  Family discussion: Admission, patients condition and plan of care including tests being ordered have been discussed with the patient and her husband at bedside* who indicate understanding and agree with the plan and Code Status.   Time Spent on Admission: 65 minutes  [facility] Pager: [phone] 04/28/2014, 8:37 PM  If 7PM-7AM, please contact night-coverage  [email or web address]  Password [email or web address]

## 2014-04-28 NOTE — ED Notes (Signed)
Pt comes in from home. States she feels generalized weakness. Per family, pt is not eating the same as usual and vomited x1 yesterday. Pt is alert and oriented with NAD noted.

## 2014-04-28 NOTE — ED Notes (Signed)
Lab made aware of blood cultures that need to be drawn.

## 2014-04-28 NOTE — Progress Notes (Signed)
Pharmacy Note:  Initial antibiotics for Vancomycin and Zosyn ordered by EDP for Sepsis.  Estimated Creatinine Clearance: 18.5 mL/min (by C-G formula based on Cr of 3.44).   Allergies  Allergen Reactions  . Neurontin [Gabapentin] Anaphylaxis  . Acetaminophen Itching and Nausea And Vomiting  . Aspirin     Intolerance because of peptic ulcer  . Eggs Or Egg-Derived Products Nausea And Vomiting  . Tramadol     Nausea and vomiting  . Codeine Itching and Nausea And Vomiting    Patient is able to take morphine, demerol and fentanyl with out any type of side effects    Filed Vitals:   04/28/14 1733  BP: 90/74  Pulse: 89  Temp: 97.9 F (36.6 C)  Resp: 22    Anti-infectives    Start     Dose/Rate Route Frequency Ordered Stop   04/28/14 1930  piperacillin-tazobactam (ZOSYN) IVPB 3.375 g     3.375 g100 mL/hr over 30 Minutes Intravenous  Once 04/28/14 1926     04/28/14 1930  vancomycin (VANCOCIN) IVPB 1000 mg/200 mL premix     1,000 mg200 mL/hr over 60 Minutes Intravenous  Once 04/28/14 1926        Plan: Initial doses of Vancomycin 1gm and Zosyn 3.375gm X 1 ordered. F/U admission orders for further dosing if therapy continued.  Pricilla Larsson, [facility] 04/28/2014 7:31 PM

## 2014-04-28 NOTE — ED Provider Notes (Signed)
CSN: [id number]     Arrival date & time 04/28/14  1722 History  This chart was scribed for Brandy Sorrow, MD by Delphia Grates, ED Scribe. This patient was seen in room APA06/APA06 and the patient's care was started at 6:02 PM.   Chief Complaint  Patient presents with  . Weakness   LEVEL 5 CAVEAT: ALTERED MENTAL STATUS  Patient is a 54 y.o. female presenting with weakness. The history is provided by the patient, a relative and medical records. The history is limited by the condition of the patient. No language interpreter was used.  Weakness This is a new problem. The current episode started yesterday. The problem has not changed since onset.    HPI Comments: [name] is a 54 y.o. female who presents to the Emergency Department complaining of generalized weakness that began yesterday. Patient states she is dizzy and confused. Family was concerned due to patient not eating well and becoming very weak. Patient has history of COPD, ascites, cirrhosis of the liver.    Past Medical History  Diagnosis Date  . Guillain-Barre   . Blind   . Neuropathy   . Thyroid disease   . Back pain   . Arthritis   . Seizures   . UTI (lower urinary tract infection)   . Anxiety   . Depression   . DDD (degenerative disc disease), lumbar   . Hepatitis C antibody test positive     HCV RNA is negative 03/2013  . Mild diastolic dysfunction 9/50/9326  . Esophagitis, erosive 03/2014  . Hepatic steatosis 04/23/2013  . Hypomagnesemia 04/23/2013  . Cirrhosis of liver without mention of alcohol     ct on 06/06/13= Subtle nodular contour of the liver. Cirrhosis cannot be excluded.   . PUD (peptic ulcer disease)   . Tubular adenoma   . Chronic abdominal pain   . Drug-seeking behavior     per [facility] admission records 01/04/14  . Chronic hypotension   . History of ETOH abuse    Past Surgical History  Procedure Laterality Date  . Cholecystectomy    . Tubal ligation    . Femur fracture  surgery    . Ankle surgery    . Back surgery    . Esophagogastroduodenoscopy N/A 03/21/2013    Dr. Gala Romney: severe erosive reflux esophagitis, hiatal hernia, gastric ulcer with element of partial gastric outlet obstruction secondary to ulcer effect. Likely r/t NSAIDs. negative H.pylori  . Colonoscopy with esophagogastroduodenoscopy (egd) N/A 06/24/2013    Dr. Gala Romney: Erosive reflux esophagitis-much improved over that seen previously. Hiatal hernia. Previously noted gastric ulcer completely healed.  Portal gastropathy/TCS:Rectal varices/anal canal hemorrhoids. Portal colopathy.Colonic diverticulosis. Colonic polyp--tubular adenoma   Family History  Problem Relation Age of Onset  . Colon cancer Neg Hx   . Kidney disease Mother    History  Substance Use Topics  . Smoking status: Current Every Day Smoker -- 0.50 packs/day    Types: Cigarettes  . Smokeless tobacco: Not on file  . Alcohol Use: No     Comment: hx of ETOH abuse, none in 6 months   OB History    No data available     Review of Systems  Unable to perform ROS: Mental status change      Allergies  Neurontin; Acetaminophen; Aspirin; Eggs or egg-derived products; Tramadol; and Codeine  Home Medications   Prior to Admission medications   Medication Sig Start Date End Date Taking? Authorizing Provider  cephALEXin (KEFLEX) 500 MG capsule Take  1 capsule (500 mg total) by mouth 2 (two) times daily. Patient taking differently: Take 500 mg by mouth 3 (three) times daily. 10 day course starting on 04/26/2014 01/14/14  Yes Donne Hazel, MD  clotrimazole (LOTRIMIN) 1 % cream Apply 1 application topically 2 (two) times daily.   Yes Historical Provider, MD  cyanocobalamin (,VITAMIN B-12,) 1000 MCG/ML injection Inject 1,000 mcg into the muscle every 30 (thirty) days. Sunday   Yes Historical Provider, MD  furosemide (LASIX) 40 MG tablet Take 1 tablet (40 mg total) by mouth every morning. Patient taking differently: Take 40-80 mg by mouth 2  (two) times daily. 80mg  in the morning and 40mg  at bedtime 01/13/14  Yes Donne Hazel, MD  ipratropium-albuterol (DUONEB) 0.5-2.5 (3) MG/3ML SOLN Take 3 mLs by nebulization every 6 (six) hours as needed (for shortness of breath).    Yes Historical Provider, MD  lactulose (CHRONULAC) 10 GM/15ML solution TAKE TWO TABLESPOONSFUL (30 ML) BY MOUTH TWICE DAILY 12/15/13  Yes Orvil Feil, NP  levothyroxine (SYNTHROID, LEVOTHROID) 50 MCG tablet Take 50 mcg by mouth daily before breakfast.   Yes Historical Provider, MD  pantoprazole (PROTONIX) 40 MG tablet Take 1 tablet (40 mg total) by mouth 2 (two) times daily before a meal. 03/26/13  Yes Samuella Cota, MD  pregabalin (LYRICA) 50 MG capsule Take 50-100 mg by mouth 2 (two) times daily. 1 in the morning and two at bedtime   Yes Historical Provider, MD  spironolactone (ALDACTONE) 100 MG tablet Take 100 mg by mouth every morning.   Yes Historical Provider, MD  thiamine 100 MG tablet Take 100 mg by mouth daily.   Yes Historical Provider, MD  hydrocortisone (ANUSOL-HC) 25 MG suppository Place 1 suppository (25 mg total) rectally 2 (two) times daily. Patient not taking: Reported on 04/28/2014 01/13/14   Donne Hazel, MD  morphine (MSIR) 15 MG tablet Take 1 tablet (15 mg total) by mouth 4 (four) times daily. 02/20/14   Babette Relic, MD   Triage Vitals: BP 90/74 mmHg  Pulse 89  Temp(Src) 97.9 F (36.6 C) (Oral)  Resp 22  Ht 5\' 4"  (1.626 m)  Wt 160 lb (72.576 kg)  BMI 27.45 kg/m2  SpO2 100%  Physical Exam  Constitutional: She is oriented to person, place, and time. She appears well-developed and well-nourished. No distress.  HENT:  Head: Normocephalic and atraumatic.  Eyes: EOM are normal.  Pale sclera.  Neck: Neck supple. No tracheal deviation present.  Cardiovascular: Normal rate, regular rhythm and normal heart sounds.   Pulmonary/Chest: Effort normal. No respiratory distress. She has wheezes.  Bilateral mild wheezing  Abdominal: Bowel sounds are  normal. She exhibits distension. There is no tenderness.  Musculoskeletal: Normal range of motion. She exhibits edema.  Swelling in both legs.  Neurological: She is alert and oriented to person, place, and time.  Skin: Skin is warm and dry. Bruising noted.  Bruising and sores noted to both legs. Bruising all over skin suspicion of bleeding easily due to liver.  Psychiatric: She has a normal mood and affect. Her behavior is normal.  Nursing note and vitals reviewed.   ED Course  Procedures (including critical care time)  DIAGNOSTIC STUDIES: Oxygen Saturation is 100% on room air, normal by my interpretation.    COORDINATION OF CARE: At 3267 Discussed treatment plan with patient. Patient agrees.   Labs Review Labs Reviewed  CBC WITH DIFFERENTIAL - Abnormal; Notable for the following:    RBC 2.32 (*)  Hemoglobin 7.8 (*)    HCT 22.6 (*)    Platelets 117 (*)    All other components within normal limits  COMPREHENSIVE METABOLIC PANEL - Abnormal; Notable for the following:    Sodium 133 (*)    CO2 17 (*)    Glucose, Bld 104 (*)    BUN 56 (*)    Creatinine, Ser 3.44 (*)    Albumin 2.4 (*)    GFR calc non Af Amer 14 (*)    GFR calc Af Amer 16 (*)    All other components within normal limits  URINALYSIS, ROUTINE W REFLEX MICROSCOPIC - Abnormal; Notable for the following:    APPearance CLOUDY (*)    Hgb urine dipstick SMALL (*)    Leukocytes, UA LARGE (*)    All other components within normal limits  AMMONIA - Abnormal; Notable for the following:    Ammonia 92 (*)    All other components within normal limits  URINE MICROSCOPIC-ADD ON - Abnormal; Notable for the following:    Squamous Epithelial / LPF MANY (*)    Bacteria, UA MANY (*)    All other components within normal limits  I-STAT CG4 LACTIC ACID, ED - Abnormal; Notable for the following:    Lactic Acid, Venous 3.14 (*)    All other components within normal limits  CULTURE, BLOOD (ROUTINE X 2)  CULTURE, BLOOD (ROUTINE  X 2)  URINE CULTURE  LIPASE, BLOOD  PROTIME-INR  I-STAT CG4 LACTIC ACID, ED  TYPE AND SCREEN   Results for orders placed or performed during the hospital encounter of 04/28/14  Culture, blood (routine x 2)  Result Value Ref Range   Specimen Description BLOOD LEFT HAND    Special Requests BOTTLES DRAWN AEROBIC ONLY 5CC    Culture PENDING    Report Status PENDING   Culture, blood (routine x 2)  Result Value Ref Range   Specimen Description BLOOD LEFT HAND    Special Requests BOTTLES DRAWN AEROBIC AND ANAEROBIC 5CC    Culture PENDING    Report Status PENDING   CBC with Differential  Result Value Ref Range   WBC 5.5 4.0 - 10.5 K/uL   RBC 2.32 (L) 3.87 - 5.11 MIL/uL   Hemoglobin 7.8 (L) 12.0 - 15.0 g/dL   HCT 22.6 (L) 36.0 - 46.0 %   MCV 97.4 78.0 - 100.0 fL   MCH 33.6 26.0 - 34.0 pg   MCHC 34.5 30.0 - 36.0 g/dL   RDW 15.0 11.5 - 15.5 %   Platelets 117 (L) 150 - 400 K/uL   Neutrophils Relative % 66 43 - 77 %   Neutro Abs 3.6 1.7 - 7.7 K/uL   Lymphocytes Relative 22 12 - 46 %   Lymphs Abs 1.2 0.7 - 4.0 K/uL   Monocytes Relative 9 3 - 12 %   Monocytes Absolute 0.5 0.1 - 1.0 K/uL   Eosinophils Relative 2 0 - 5 %   Eosinophils Absolute 0.1 0.0 - 0.7 K/uL   Basophils Relative 1 0 - 1 %   Basophils Absolute 0.0 0.0 - 0.1 K/uL  Comprehensive metabolic panel  Result Value Ref Range   Sodium 133 (L) 135 - 145 mmol/L   Potassium 4.1 3.5 - 5.1 mmol/L   Chloride 102 96 - 112 mEq/L   CO2 17 (L) 19 - 32 mmol/L   Glucose, Bld 104 (H) 70 - 99 mg/dL   BUN 56 (H) 6 - 23 mg/dL   Creatinine, Ser 3.44 (H) 0.50 -  1.10 mg/dL   Calcium 8.8 8.4 - 10.5 mg/dL   Total Protein 6.3 6.0 - 8.3 g/dL   Albumin 2.4 (L) 3.5 - 5.2 g/dL   AST 33 0 - 37 U/L   ALT 14 0 - 35 U/L   Alkaline Phosphatase 110 39 - 117 U/L   Total Bilirubin 0.9 0.3 - 1.2 mg/dL   GFR calc non Af Amer 14 (L) >90 mL/min   GFR calc Af Amer 16 (L) >90 mL/min   Anion gap 14 5 - 15  Lipase, blood  Result Value Ref Range   Lipase  25 11 - 59 U/L  Protime-INR  Result Value Ref Range   Prothrombin Time 15.1 11.6 - 15.2 seconds   INR 1.18 0.00 - 1.49  Urinalysis, Routine w reflex microscopic  Result Value Ref Range   Color, Urine YELLOW YELLOW   APPearance CLOUDY (A) CLEAR   Specific Gravity, Urine 1.015 1.005 - 1.030   pH 5.5 5.0 - 8.0   Glucose, UA NEGATIVE NEGATIVE mg/dL   Hgb urine dipstick SMALL (A) NEGATIVE   Bilirubin Urine NEGATIVE NEGATIVE   Ketones, ur NEGATIVE NEGATIVE mg/dL   Protein, ur NEGATIVE NEGATIVE mg/dL   Urobilinogen, UA 0.2 0.0 - 1.0 mg/dL   Nitrite NEGATIVE NEGATIVE   Leukocytes, UA LARGE (A) NEGATIVE  Ammonia  Result Value Ref Range   Ammonia 92 (H) 11 - 32 umol/L  Urine microscopic-add on  Result Value Ref Range   Squamous Epithelial / LPF MANY (A) RARE   WBC, UA TOO NUMEROUS TO COUNT <3 WBC/hpf   RBC / HPF 3-6 <3 RBC/hpf   Bacteria, UA MANY (A) RARE  I-Stat CG4 Lactic Acid, ED  Result Value Ref Range   Lactic Acid, Venous 3.14 (H) 0.5 - 2.2 mmol/L     Imaging Review Dg Chest Port 1 View  04/28/2014   CLINICAL DATA:  Weakness, shortness of breath and confusion for several days.  EXAM: PORTABLE CHEST - 1 VIEW  COMPARISON:  Single view of the dens 02/20/2014 and 01/11/2014.  FINDINGS: Lungs are clear. Heart size is normal. There is no pneumothorax or pleural effusion.  IMPRESSION: Negative exam.   Electronically Signed   By: Inge Rise M.D.   On: 04/28/2014 18:50     EKG Interpretation   Date/Time:  Friday April 28 2014 17:32:47 EST Ventricular Rate:  90 PR Interval:  142 QRS Duration: 85 QT Interval:  431 QTC Calculation: 527 R Axis:   34 Text Interpretation:  Sinus rhythm Low voltage, extremity and precordial  leads Minimal ST elevation, lateral leads Prolonged QT interval Baseline  wander in lead(s) V6 Artifact Confirmed by Kada Friesen  MD, Shylo Zamor [phone]) on  04/28/2014 5:40:26 PM     CRITICAL CARE Performed by: [name] Total critical care time:  30 Critical care time was exclusive of separately billable procedures and treating other patients. Critical care was necessary to treat or prevent imminent or life-threatening deterioration. Critical care was time spent personally by me on the following activities: development of treatment plan with patient and/or surrogate as well as nursing, discussions with consultants, evaluation of patient's response to treatment, examination of patient, obtaining history from patient or surrogate, ordering and performing treatments and interventions, ordering and review of laboratory studies, ordering and review of radiographic studies, pulse oximetry and re-evaluation of patient's condition.  MDM   Final diagnoses:  Weakness  Other specified hypotension  Serum ammonia increased  Renal failure  Anemia, unspecified anemia type  UTI (lower  urinary tract infection)    Patient with multiple concerns upon arrival. Including hypotension and occasional blood pressures of systolic 16-10. Currently following fluids systolic is 94. Patient never tachycardic not with fever however temperature little on the low side at 97.9. Oxygen saturation is reasonable. Chest x-ray negative for any acute findings including pneumonia pulmonary edema or pneumothorax. Patient's workup was for sepsis or pre-sepsis. Patient had blood cultures urine culture urinalysis although has many squamous cells should've been done on Foley catheterization should be accurate as too numerous to count whites. Patient started on septic antibiotics per protocol. Patient received fluids. Blood pressure improved. Ammonia levels elevated renal function seems to be consistent with acute renal failure and failure probably prerenal. Patient also has an anemia with hemoglobin less than 8. Patient will require admission probably blood transfusion. Antibiotics are being started. Patient also had some bilateral wheezing upon arrival and received breathing treatment  for that with clearing of the wheezing.  Patient's underlying problem seems to be cirrhosis. There is marked distention of the abdomen probably with ascites however no significant belly tenderness. Not concerned about bacterial peritonitis. Patient also most likely has a bleeding tendency since she has bruises through out most of her extremities. Mental status-wise despite the slight elevation in the ammonia level is reasonable patient will answer questions. Patient when she first came in was somewhat out of it with the fluids is improving. I think dehydration is playing significant role here. Patient's potassium is normal. Probably  I personally performed the services described in this documentation, which was scribed in my presence. The recorded information has been reviewed and is accurate.     Brandy Sorrow, MD 04/28/14 [id number]

## 2014-04-28 NOTE — Progress Notes (Signed)
ANTIBIOTIC CONSULT NOTE  Pharmacy Consult for Vancomycin and Zosyn  Indication: rule out sepsis  Allergies  Allergen Reactions  . Neurontin [Gabapentin] Anaphylaxis  . Acetaminophen Itching and Nausea And Vomiting  . Aspirin     Intolerance because of peptic ulcer  . Eggs Or Egg-Derived Products Nausea And Vomiting  . Tramadol     Nausea and vomiting  . Codeine Itching and Nausea And Vomiting    Patient is able to take morphine, demerol and fentanyl with out any type of side effects    Patient Measurements: Height: 5\' 4"  (162.6 cm) Weight: 160 lb (72.576 kg) IBW/kg (Calculated) : 54.7  Vital Signs: Temp: 97.9 F (36.6 C) (01/15 1733) Temp Source: Oral (01/15 1733) BP: 90/74 mmHg (01/15 1733) Pulse Rate: 89 (01/15 1733) Intake/Output from previous day:   Intake/Output from this shift:    Labs:  Recent Labs  04/28/14 1745  WBC 5.5  HGB 7.8*  PLT 117*  CREATININE 3.44*   Estimated Creatinine Clearance: 18.5 mL/min (by C-G formula based on Cr of 3.44). No results for input(s): VANCOTROUGH, VANCOPEAK, VANCORANDOM, GENTTROUGH, GENTPEAK, GENTRANDOM, TOBRATROUGH, TOBRAPEAK, TOBRARND, AMIKACINPEAK, AMIKACINTROU, AMIKACIN in the last 72 hours.   Microbiology: Recent Results (from the past 720 hour(s))  Culture, blood (routine x 2)     Status: None (Preliminary result)   Collection Time: 04/28/14  5:45 PM  Result Value Ref Range Status   Specimen Description BLOOD LEFT HAND  Final   Special Requests BOTTLES DRAWN AEROBIC ONLY 5CC  Final   Culture PENDING  Incomplete   Report Status PENDING  Incomplete  Culture, blood (routine x 2)     Status: None (Preliminary result)   Collection Time: 04/28/14  6:33 PM  Result Value Ref Range Status   Specimen Description BLOOD LEFT HAND  Final   Special Requests BOTTLES DRAWN AEROBIC AND ANAEROBIC 5CC  Final   Culture PENDING  Incomplete   Report Status PENDING  Incomplete    Anti-infectives    Start     Dose/Rate Route  Frequency Ordered Stop   04/28/14 1930  piperacillin-tazobactam (ZOSYN) IVPB 3.375 g     3.375 g100 mL/hr over 30 Minutes Intravenous  Once 04/28/14 1926 04/28/14 2023   04/28/14 1930  vancomycin (VANCOCIN) IVPB 1000 mg/200 mL premix     1,000 mg200 mL/hr over 60 Minutes Intravenous  Once 04/28/14 1926 04/28/14 2053      Assessment: Okay for Protocol, hypotension, mild confusion, elevated lactic acid started on vancomycin and Zosyn empirically for possible sepsis. Acute renal failure.  Initial doses given in ED.  Goal of Therapy:  Vancomycin trough level 15-20 mcg/ml  Plan:  Zosyn 2.25GM IV every 8 hours. Vancomycin 1gm IV every 48 hours. Measure antibiotic drug levels at steady state Follow up culture results  [name] 04/28/2014,9:14 PM

## 2014-04-28 NOTE — Progress Notes (Signed)
Patient being transferred to another facility per bed control.

## 2014-04-29 ENCOUNTER — Inpatient Hospital Stay (HOSPITAL_COMMUNITY): Payer: Medicare Other

## 2014-04-29 DIAGNOSIS — N179 Acute kidney failure, unspecified: Secondary | ICD-10-CM

## 2014-04-29 DIAGNOSIS — K7031 Alcoholic cirrhosis of liver with ascites: Secondary | ICD-10-CM

## 2014-04-29 DIAGNOSIS — D6489 Other specified anemias: Secondary | ICD-10-CM

## 2014-04-29 DIAGNOSIS — R6521 Severe sepsis with septic shock: Secondary | ICD-10-CM

## 2014-04-29 DIAGNOSIS — A419 Sepsis, unspecified organism: Principal | ICD-10-CM

## 2014-04-29 LAB — ALBUMIN, FLUID (OTHER): Albumin, Fluid: <1 g/dL

## 2014-04-29 LAB — PROTEIN, BODY FLUID: Total protein, fluid: <4 g/dL

## 2014-04-29 LAB — COMPREHENSIVE METABOLIC PANEL
ALBUMIN: 2 g/dL — AB (ref 3.5–5.2)
ALK PHOS: 100 U/L (ref 39–117)
ALT: 20 U/L (ref 0–35)
ANION GAP: 8 (ref 5–15)
AST: 35 U/L (ref 0–37)
BUN: 48 mg/dL — ABNORMAL HIGH (ref 6–23)
CO2: 20 mmol/L (ref 19–32)
CREATININE: 3.06 mg/dL — AB (ref 0.50–1.10)
Calcium: 8 mg/dL — ABNORMAL LOW (ref 8.4–10.5)
Chloride: 104 mEq/L (ref 96–112)
GFR calc Af Amer: 19 mL/min — ABNORMAL LOW (ref >90)
GFR calc non Af Amer: 16 mL/min — ABNORMAL LOW (ref >90)
Glucose, Bld: 86 mg/dL (ref 70–99)
Potassium: 4.5 mmol/L (ref 3.5–5.1)
Sodium: 132 mmol/L — ABNORMAL LOW (ref 135–145)
Total Bilirubin: 1.1 mg/dL (ref 0.3–1.2)
Total Protein: 5.8 g/dL — ABNORMAL LOW (ref 6.0–8.3)

## 2014-04-29 LAB — AMMONIA: Ammonia: 97 umol/L — ABNORMAL HIGH (ref 11–32)

## 2014-04-29 LAB — CBC
HCT: 21.7 % — ABNORMAL LOW (ref 36.0–46.0)
Hemoglobin: 7.7 g/dL — ABNORMAL LOW (ref 12.0–15.0)
MCH: 33.8 pg (ref 26.0–34.0)
MCHC: 35.5 g/dL (ref 30.0–36.0)
MCV: 95.2 fL (ref 78.0–100.0)
Platelets: 109 10*3/uL — ABNORMAL LOW (ref 150–400)
RBC: 2.28 MIL/uL — AB (ref 3.87–5.11)
RDW: 14.7 % (ref 11.5–15.5)
WBC: 5.1 10*3/uL (ref 4.0–10.5)

## 2014-04-29 LAB — BODY FLUID CELL COUNT WITH DIFFERENTIAL: Total Nucleated Cell Count, Fluid: 5 cu mm (ref 0–1000)

## 2014-04-29 LAB — MRSA PCR SCREENING: MRSA BY PCR: NEGATIVE

## 2014-04-29 LAB — LACTIC ACID, PLASMA: Lactic Acid, Venous: 1.6 mmol/L (ref 0.5–2.2)

## 2014-04-29 LAB — SODIUM, URINE, RANDOM: Sodium, Ur: 26 mmol/L

## 2014-04-29 MED ORDER — IPRATROPIUM-ALBUTEROL 0.5-2.5 (3) MG/3ML IN SOLN
3.0000 mL | Freq: Four times a day (QID) | RESPIRATORY_TRACT | Status: DC | PRN
  Administered 2014-04-30: 3 mL via RESPIRATORY_TRACT
  Filled 2014-04-29: qty 3

## 2014-04-29 MED ORDER — LACTULOSE 10 GM/15ML PO SOLN
30.0000 g | Freq: Two times a day (BID) | ORAL | Status: DC
  Administered 2014-04-29 – 2014-05-01 (×4): 30 g via ORAL
  Filled 2014-04-29 (×8): qty 45

## 2014-04-29 MED ORDER — VANCOMYCIN HCL IN DEXTROSE 1-5 GM/200ML-% IV SOLN
1000.0000 mg | INTRAVENOUS | Status: DC
  Administered 2014-04-29: 1000 mg via INTRAVENOUS
  Filled 2014-04-29 (×2): qty 200

## 2014-04-29 MED ORDER — SODIUM CHLORIDE 0.9 % IV BOLUS (SEPSIS)
1000.0000 mL | Freq: Once | INTRAVENOUS | Status: AC
  Administered 2014-04-29: 1000 mL via INTRAVENOUS

## 2014-04-29 MED ORDER — LEVOTHYROXINE SODIUM 50 MCG PO TABS
50.0000 ug | ORAL_TABLET | Freq: Every day | ORAL | Status: DC
  Administered 2014-04-29 – 2014-05-01 (×2): 50 ug via ORAL
  Filled 2014-04-29 (×5): qty 1

## 2014-04-29 MED ORDER — CHLORHEXIDINE GLUCONATE 0.12 % MT SOLN
15.0000 mL | Freq: Two times a day (BID) | OROMUCOSAL | Status: DC
  Administered 2014-04-29 – 2014-05-01 (×3): 15 mL via OROMUCOSAL
  Filled 2014-04-29 (×7): qty 15

## 2014-04-29 MED ORDER — MORPHINE SULFATE 2 MG/ML IJ SOLN
2.0000 mg | INTRAMUSCULAR | Status: DC | PRN
  Administered 2014-04-29 – 2014-04-30 (×5): 2 mg via INTRAVENOUS
  Filled 2014-04-29 (×4): qty 1

## 2014-04-29 MED ORDER — PIPERACILLIN-TAZOBACTAM 3.375 G IVPB
3.3750 g | Freq: Three times a day (TID) | INTRAVENOUS | Status: DC
  Administered 2014-04-29 – 2014-04-30 (×3): 3.375 g via INTRAVENOUS
  Filled 2014-04-29 (×5): qty 50

## 2014-04-29 MED ORDER — ONDANSETRON HCL 4 MG PO TABS: 4.0000 mg | ORAL_TABLET | Freq: Four times a day (QID) | ORAL | Status: DC | PRN

## 2014-04-29 MED ORDER — ALBUMIN HUMAN 25 % IV SOLN
50.0000 g | Freq: Three times a day (TID) | INTRAVENOUS | Status: DC
Start: 2014-04-29 — End: 2014-04-30
  Administered 2014-04-29 – 2014-04-30 (×4): 50 g via INTRAVENOUS
  Filled 2014-04-29 (×7): qty 200

## 2014-04-29 MED ORDER — PANTOPRAZOLE SODIUM 40 MG PO TBEC
40.0000 mg | DELAYED_RELEASE_TABLET | Freq: Two times a day (BID) | ORAL | Status: DC
  Administered 2014-04-29 (×2): 40 mg via ORAL
  Filled 2014-04-29 (×3): qty 1

## 2014-04-29 MED ORDER — BOOST / RESOURCE BREEZE PO LIQD
1.0000 | Freq: Three times a day (TID) | ORAL | Status: DC
  Administered 2014-04-29 – 2014-05-01 (×5): 1 via ORAL

## 2014-04-29 MED ORDER — ALBUMIN HUMAN 25 % IV SOLN
50.0000 g | Freq: Three times a day (TID) | INTRAVENOUS | Status: DC
  Filled 2014-04-29 (×3): qty 200

## 2014-04-29 MED ORDER — CETYLPYRIDINIUM CHLORIDE 0.05 % MT LIQD
7.0000 mL | Freq: Two times a day (BID) | OROMUCOSAL | Status: DC
  Administered 2014-04-29 – 2014-04-30 (×3): 7 mL via OROMUCOSAL

## 2014-04-29 MED ORDER — LIDOCAINE HCL (PF) 1 % IJ SOLN
INTRAMUSCULAR | Status: AC
  Filled 2014-04-29: qty 10

## 2014-04-29 MED ORDER — MORPHINE SULFATE 2 MG/ML IJ SOLN
1.0000 mg | INTRAMUSCULAR | Status: DC | PRN
  Administered 2014-04-29: 1 mg via INTRAVENOUS
  Filled 2014-04-29 (×2): qty 1

## 2014-04-29 MED ORDER — SODIUM CHLORIDE 0.9 % IV SOLN
INTRAVENOUS | Status: DC
  Administered 2014-04-29: via INTRAVENOUS

## 2014-04-29 MED ORDER — IBUPROFEN 200 MG PO TABS
200.0000 mg | ORAL_TABLET | Freq: Once | ORAL | Status: AC
  Administered 2014-04-29: 200 mg via ORAL
  Filled 2014-04-29: qty 1

## 2014-04-29 MED ORDER — ONDANSETRON HCL 4 MG/2ML IJ SOLN
4.0000 mg | Freq: Four times a day (QID) | INTRAMUSCULAR | Status: DC | PRN
  Administered 2014-04-30: 4 mg via INTRAVENOUS
  Filled 2014-04-29: qty 2

## 2014-04-29 MED ORDER — MIDODRINE HCL 5 MG PO TABS
10.0000 mg | ORAL_TABLET | Freq: Three times a day (TID) | ORAL | Status: DC
  Administered 2014-04-29 – 2014-04-30 (×3): 10 mg via ORAL
  Filled 2014-04-29 (×6): qty 2

## 2014-04-29 NOTE — Progress Notes (Signed)
Administered 2mg  morphine at 1515 per Dr. Jonnie Finner. At this time there was an existing order for 1mg  morphine. Removed a 2mg /4mL vial from pyxis and administered full 2 mg. Also received a prompt to waste 1 mg. Order for the 2mg  morphine was placed a little later. Called pharmacy to see how best to go about fixing the pyxis without getting an occurrence. They said to waste medication in pyxis and write a note and they would also give a note to the person doing the narcotics count.

## 2014-04-29 NOTE — Progress Notes (Signed)
   04/29/14 2000  Clinical Encounter Type  Visited With Patient;Health care provider  Visit Type Initial;Spiritual support;Social support  Referral From Nurse  Stress Factors  Patient Stress Factors Exhausted;Family relationships;Loss of control;Major life changes   Chaplain was paged to patient's room 8:08 PM. Patient was having a difficult night and wanted to speak with chaplain. Patient explained that she has a pretty serious medical condition involving her liver and that it is likely not to get better. 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 Patient said she "slipped" and began drinking heavily again after her brother passed away tragically. Patient said this has been a difficult time for her and her family and neither herself or her family has fully accepted her medical condition. Patient also mentioned some feelings of grief after the loss of a pet recently. Patient asked for prayer and highlighted her husband as a major concern. Chaplain prayed with patient. Chaplain will continue to provide emotional and spiritual support for patient and patient's family as needed. Gar Ponto, Chaplain  8:58 PM

## 2014-04-29 NOTE — Progress Notes (Signed)
ANTIBIOTIC CONSULT NOTE--Followup  Pharmacy Consult for Vancomycin and Zosyn  Indication: rule out sepsis  Allergies  Allergen Reactions  . Neurontin [Gabapentin] Anaphylaxis  . Acetaminophen Itching and Nausea And Vomiting  . Aspirin     Intolerance because of peptic ulcer  . Eggs Or Egg-Derived Products Nausea And Vomiting  . Tramadol     Nausea and vomiting  . Codeine Itching and Nausea And Vomiting    Patient is able to take morphine, demerol and fentanyl with out any type of side effects    Patient Measurements: Height: 5\' 4"  (162.6 cm) Weight: 179 lb 10.8 oz (81.5 kg) IBW/kg (Calculated) : 54.7  Vital Signs: Temp: 97.7 F (36.5 C) (01/16 1145) Temp Source: Oral (01/16 1145) BP: 79/55 mmHg (01/16 1145) Pulse Rate: 91 (01/16 1145) Intake/Output from previous day: 01/15 0701 - 01/16 0700 In: 3350 [I.V.:300; IV Piggyback:3050] Out: 375 [Urine:375] Intake/Output from this shift: Total I/O In: 200 [IV Piggyback:200] Out: -   Labs:  Recent Labs  04/28/14 1745 04/29/14 0355  WBC 5.5 5.1  HGB 7.8* 7.7*  PLT 117* 109*  CREATININE 3.44* 3.06*   Estimated Creatinine Clearance: 22 mL/min (by C-G formula based on Cr of 3.06). No results for input(s): VANCOTROUGH, VANCOPEAK, VANCORANDOM, GENTTROUGH, GENTPEAK, GENTRANDOM, TOBRATROUGH, TOBRAPEAK, TOBRARND, AMIKACINPEAK, AMIKACINTROU, AMIKACIN in the last 72 hours.   Microbiology: Recent Results (from the past 720 hour(s))  Culture, blood (routine x 2)     Status: None (Preliminary result)   Collection Time: 04/28/14  5:45 PM  Result Value Ref Range Status   Specimen Description BLOOD LEFT HAND  Final   Special Requests BOTTLES DRAWN AEROBIC ONLY 5CC  Final   Culture PENDING  Incomplete   Report Status PENDING  Incomplete  Culture, blood (routine x 2)     Status: None (Preliminary result)   Collection Time: 04/28/14  6:33 PM  Result Value Ref Range Status   Specimen Description BLOOD LEFT HAND  Final   Special  Requests BOTTLES DRAWN AEROBIC AND ANAEROBIC 5CC  Final   Culture PENDING  Incomplete   Report Status PENDING  Incomplete  MRSA PCR Screening     Status: None   Collection Time: 04/29/14  1:26 AM  Result Value Ref Range Status   MRSA by PCR NEGATIVE NEGATIVE Final    Comment:        The GeneXpert MRSA Assay (FDA approved for NASAL specimens only), is one component of a comprehensive MRSA colonization surveillance program. It is not intended to diagnose MRSA infection nor to guide or monitor treatment for MRSA infections.     Anti-infectives    Start     Dose/Rate Route Frequency Ordered Stop   04/30/14 1600  vancomycin (VANCOCIN) IVPB 1000 mg/200 mL premix     1,000 mg200 mL/hr over 60 Minutes Intravenous Every 48 hours 04/28/14 2121     04/29/14 1200  piperacillin-tazobactam (ZOSYN) IVPB 3.375 g     3.375 g12.5 mL/hr over 240 Minutes Intravenous Every 8 hours 04/29/14 1141     04/29/14 0400  piperacillin-tazobactam (ZOSYN) IVPB 2.25 g  Status:  Discontinued     2.25 g100 mL/hr over 30 Minutes Intravenous Every 8 hours 04/28/14 2121 04/29/14 1141   04/28/14 1930  piperacillin-tazobactam (ZOSYN) IVPB 3.375 g     3.375 g100 mL/hr over 30 Minutes Intravenous  Once 04/28/14 1926 04/28/14 2023   04/28/14 1930  vancomycin (VANCOCIN) IVPB 1000 mg/200 mL premix     1,000 mg200 mL/hr over 60 Minutes  Intravenous  Once 04/28/14 1926 04/28/14 2053      Assessment: 32 yof originally presented to [facility] with hypotension, mild confusion, fatigue, generalized weakness and AKI. Pharmacy consulted to start vancomycin and Zosyn empirically for possible sepsis. Initial doses given in ED. SCr has improved 3.44>>3.06, will dose adjust abx for renal improvement.  Goal of Therapy:  Vancomycin trough level 15-20 mcg/ml  Plan:  Increase to Zosyn 3.375g IV q8h (4 hour infusion) Increase to vancomycin 1gm IV q24h  Measure antibiotic drug levels at steady state Follow up culture results  Keymani Glynn  E. Tymeir Weathington, Pharm.D Clinical Pharmacy Resident Pager: [phone] 04/29/2014 12:04 PM

## 2014-04-29 NOTE — Progress Notes (Signed)
Pt transferred from [facility] from [facility]. Patient A&Ox4, not in acute distress. C/o 7/10 generalized chronic pain despite pain medicine given by [facility]. Oriented to unit and room, instructed on callbell and placed within reach. 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 Patient tearful, depressed. Emotional support given. No family at bedside. Will continue to monitor.

## 2014-04-29 NOTE — Consult Note (Signed)
PULMONARY / CRITICAL CARE MEDICINE   Name: [name] MRN: [id number] DOB: May 03, 1960    ADMISSION DATE:  04/28/2014 CONSULTATION DATE:  04/29/2014  REFERRING MD :  TRH  CHIEF COMPLAINT:  Septic Shock  INITIAL PRESENTATION: 54 year old female with extensive PMH who presents to the hospital with lethargy.  She is a chronic alcoholic with hepatitis C who presents with increased ascites and now renal failure.  Baseline SBP of 90's now in the 70's and UOP has been very poor.  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  BP responds to IVF at times but then deteriorates again.  PAST MEDICAL HISTORY :   has a past medical history of Guillain-Barre; Blind; Neuropathy; Thyroid disease; Back pain; Arthritis; Seizures; UTI (lower urinary tract infection); Anxiety; Depression; DDD (degenerative disc disease), lumbar; Hepatitis C antibody test positive; Mild diastolic dysfunction (05/28/863); Esophagitis, erosive (03/2014); Hepatic steatosis (04/23/2013); Hypomagnesemia (04/23/2013); Cirrhosis of liver without mention of alcohol; PUD (peptic ulcer disease); Tubular adenoma; Chronic abdominal pain; Drug-seeking behavior; Chronic hypotension; and History of ETOH abuse.  has past surgical history that includes Cholecystectomy; Tubal ligation; Femur fracture surgery; Ankle surgery; Back surgery; Esophagogastroduodenoscopy (N/A, 03/21/2013); and Colonoscopy with esophagogastroduodenoscopy (egd) (N/A, 06/24/2013). Prior to Admission medications   Medication Sig Start Date End Date Taking? Authorizing Provider  cephALEXin (KEFLEX) 500 MG capsule Take 1 capsule (500 mg total) by mouth 2 (two) times daily. Patient taking differently: Take 500 mg by mouth 3 (three) times daily. 10 day course starting on 04/26/2014 01/14/14  Yes Donne Hazel, MD  clotrimazole (LOTRIMIN) 1 % cream Apply 1 application topically 2 (two) times daily.   Yes Historical Provider, MD  cyanocobalamin (,VITAMIN B-12,) 1000 MCG/ML injection  Inject 1,000 mcg into the muscle every 30 (thirty) days. Sunday   Yes Historical Provider, MD  furosemide (LASIX) 40 MG tablet Take 1 tablet (40 mg total) by mouth every morning. Patient taking differently: Take 40-80 mg by mouth 2 (two) times daily. 80mg  in the morning and 40mg  at bedtime 01/13/14  Yes Donne Hazel, MD  ipratropium-albuterol (DUONEB) 0.5-2.5 (3) MG/3ML SOLN Take 3 mLs by nebulization every 6 (six) hours as needed (for shortness of breath).    Yes Historical Provider, MD  lactulose (CHRONULAC) 10 GM/15ML solution TAKE TWO TABLESPOONSFUL (30 ML) BY MOUTH TWICE DAILY 12/15/13  Yes Orvil Feil, NP  levothyroxine (SYNTHROID, LEVOTHROID) 50 MCG tablet Take 50 mcg by mouth daily before breakfast.   Yes Historical Provider, MD  pantoprazole (PROTONIX) 40 MG tablet Take 1 tablet (40 mg total) by mouth 2 (two) times daily before a meal. 03/26/13  Yes Samuella Cota, MD  pregabalin (LYRICA) 50 MG capsule Take 50-100 mg by mouth 2 (two) times daily. 1 in the morning and two at bedtime   Yes Historical Provider, MD  spironolactone (ALDACTONE) 100 MG tablet Take 100 mg by mouth every morning.   Yes Historical Provider, MD  thiamine 100 MG tablet Take 100 mg by mouth daily.   Yes Historical Provider, MD  hydrocortisone (ANUSOL-HC) 25 MG suppository Place 1 suppository (25 mg total) rectally 2 (two) times daily. Patient not taking: Reported on 04/28/2014 01/13/14   Donne Hazel, MD  morphine (MSIR) 15 MG tablet Take 1 tablet (15 mg total) by mouth 4 (four) times daily. 02/20/14   Babette Relic, MD   Allergies  Allergen Reactions  . Neurontin [Gabapentin] Anaphylaxis  . Acetaminophen Itching and Nausea And Vomiting  . Aspirin  Intolerance because of peptic ulcer  . Eggs Or Egg-Derived Products Nausea And Vomiting  . Tramadol     Nausea and vomiting  . Codeine Itching and Nausea And Vomiting    Patient is able to take morphine, demerol and fentanyl with out any type of side effects     FAMILY HISTORY:  indicated that her mother is deceased.  SOCIAL HISTORY:  reports that she has been smoking Cigarettes.  She has been smoking about 0.50 packs per day. She does not have any smokeless tobacco history on file. She reports that she does not drink alcohol or use illicit drugs.  REVIEW OF SYSTEMS:  Unattainable, patient is confused with hepatic encephalopathy.  SUBJECTIVE:   VITAL SIGNS: Temp:  [97.4 F (36.3 C)-98.3 F (36.8 C)] 97.6 F (36.4 C) (01/16 1145) Pulse Rate:  [83-99] 83 (01/16 1430) Resp:  [9-22] 9 (01/16 1430) BP: (67-112)/(40-95) 76/49 mmHg (01/16 1430) SpO2:  [95 %-100 %] 100 % (01/16 1430) Weight:  [72.576 kg (160 lb)-81.5 kg (179 lb 10.8 oz)] 81.5 kg (179 lb 10.8 oz) (01/16 0000) HEMODYNAMICS:   VENTILATOR SETTINGS:   INTAKE / OUTPUT:  Intake/Output Summary (Last 24 hours) at 04/29/14 1523 Last data filed at 04/29/14 1400  Gross per 24 hour  Intake   4350 ml  Output    525 ml  Net   3825 ml    PHYSICAL EXAMINATION: General:  Chronically ill appearing blind cachectic female. Neuro:  Awake but confused at times, follows simple commands, moves all ext to command. HEENT:  /AT, pupils are not reactive, EOM spontaneous, DMM. Cardiovascular:  RRR, Nl S1/S2, -M/R/G. Lungs:  Bibasilar crackles. Abdomen:  Soft, NT, distended and hypoactive bowel sounds. Musculoskeletal:  Intact. Skin:  Intact.  LABS:  CBC  Recent Labs Lab 04/28/14 1745 04/29/14 0355  WBC 5.5 5.1  HGB 7.8* 7.7*  HCT 22.6* 21.7*  PLT 117* 109*   Coag's  Recent Labs Lab 04/28/14 1745  INR 1.18   BMET  Recent Labs Lab 04/28/14 1745 04/29/14 0355  NA 133* 132*  K 4.1 4.5  CL 102 104  CO2 17* 20  BUN 56* 48*  CREATININE 3.44* 3.06*  GLUCOSE 104* 86   Electrolytes  Recent Labs Lab 04/28/14 1745 04/29/14 0355  CALCIUM 8.8 8.0*   Sepsis Markers  Recent Labs Lab 04/28/14 1810 04/28/14 1944 04/29/14 0933  LATICACIDVEN 3.14* 0.76 1.6    ABG No results for input(s): PHART, PCO2ART, PO2ART in the last 168 hours. Liver Enzymes  Recent Labs Lab 04/28/14 1745 04/29/14 0355  AST 33 35  ALT 14 20  ALKPHOS 110 100  BILITOT 0.9 1.1  ALBUMIN 2.4* 2.0*   Cardiac Enzymes No results for input(s): TROPONINI, PROBNP in the last 168 hours. Glucose No results for input(s): GLUCAP in the last 168 hours.  Imaging Dg Chest Port 1 View  04/28/2014   CLINICAL DATA:  Weakness, shortness of breath and confusion for several days.  EXAM: PORTABLE CHEST - 1 VIEW  COMPARISON:  Single view of the dens 02/20/2014 and 01/11/2014.  FINDINGS: Lungs are clear. Heart size is normal. There is no pneumothorax or pleural effusion.  IMPRESSION: Negative exam.   Electronically Signed   By: Inge Rise M.D.   On: 04/28/2014 18:50     ASSESSMENT / PLAN: 54 year old female with an extensive PMH who is now developing renal failure with liver failure and heart failure, confused due to hepatic encephalopathy.  PCCM consulted for hypotension.  I  had an extensive discussion with the patient's husband over the phone.  She has clearly stated prior that she did not want aggressive medical care.  After I described what would be necessary to keep her alive to include a central line, pressors, likely a ventilator, CPR and cardioversion, specially that the renal service does not believe the patient is dialysis candidate.  After discussion decision was made that we will continue aggressive medical care.  To include fluid resuscitation and abx but will make patient DNR, no CPR/cardioversion/intubation/ventilation/central access or pressors.  This information was relayed to nephrology and the primary team.  Recommend fluid resuscitation, agree with abx choices.  PCCM will sign off, please call back if needed.  The patient is critically ill with multiple organ systems failure and requires high complexity decision making for assessment and support, frequent  evaluation and titration of therapies, application of advanced monitoring technologies and extensive interpretation of multiple databases.   Critical Care Time devoted to patient care services described in this note is  35  Minutes. This time reflects time of care of this signee Dr Jennet Maduro. This critical care time does not reflect procedure time, or teaching time or supervisory time of PA/NP/Med student/Med Resident etc but could involve care discussion time.  Crossan Farmer, M.D. [facility] Pulmonary/Critical Care Medicine. Pager: [phone]. After hours pager: [phone].  04/29/2014, 3:23 PM

## 2014-04-29 NOTE — Consult Note (Signed)
Renal Service Consult Note [facility]  [name] 04/29/2014 [place] Requesting Physician:  Dr. Sloan Leiter  Reason for Consult:  Acute renal failure HPI: The patient is a 54 y.o. year-old with hx of depression, anxiety, GBS, chronic pain, recurrent UTI, etoh abuse and progressive liver failure with cirrhosis and ascites.  Was here in Feb 2015 w resp failure and massive ascites requiring paracentesis. Admitted in July for abd pain, n/v/d and in September for ascites worsening with mild ARF treated with IVF's.  Admitted now after presenting with gen weakness from home, not eating, vomited x 1.  Creat was 3.4 in ED, UA +for WBC's, admitted and given IVF's and abx on SDU. Today creat down 3.0 after 3L fluids but UOP remains poor. Asked to see for AKI.  Patient is in pain, begging for pain medications. No sob , cough or CP  No n/v/d, +abd pain and distension.  NH3 was 95   Chart review: 9/09 - acute Guilain-Barre syndrome, severe weakness/ deconditoing, severe anxiety, chronic pain 10/09 - rehab stay after above admit 2/11 - viral GE, acute MI, low T4, hx GBS 6/11 - unintentional OD ov sedating mediations, acute resp failure, obesity, anxiety, hx GBS, hep C, FM, vit D def 1/14 - diarrhea, acute due to viral GI 7/14 - N/V diarrhea, abd pain > no diarrhea in hosp, w/u not done. Ecoli UTI rx with abx. ^AST , hx hep C 12/14 - n/vd and abd pain, ^LFTS, UTI , hepatomegaly and ^bilirubin > diagnosis was partial gastric outlet obstruction d/t PUD and severe reflux esophagitis, improved. ^LFT's unclear cause, hep C viral load was undetactable. Poss hepatic steatosis vs etoh vs other.  Fall, legally blind, smoker, hx GBS.   1/15 - dehydration, low K, low Mg, LE edema, malnutrtion, fatty liver w ^LFT's, prob cirrhosis.  Hep C Ab + but HCV RNA negative.   2/15 - acute resp failure d/t massive ascites, cirrhosis, hx etoh abuse in past,   7/15 - abd pain, n/v/d. Cdif was negative,  likely viral GE.  DC'd home.  9/25 - 01/14/14 > abd distension, ascites/ cirrhosis/ etoh+ fatty liver , dec'd UOP rx with IV fluids   ROS  no rash  no HA  no LE swelling or jt pain  Past Medical History  Past Medical History  Diagnosis Date  . Guillain-Barre   . Blind   . Neuropathy   . Thyroid disease   . Back pain   . Arthritis   . Seizures   . UTI (lower urinary tract infection)   . Anxiety   . Depression   . DDD (degenerative disc disease), lumbar   . Hepatitis C antibody test positive     HCV RNA is negative 03/2013  . Mild diastolic dysfunction 0/86/7619  . Esophagitis, erosive 03/2014  . Hepatic steatosis 04/23/2013  . Hypomagnesemia 04/23/2013  . Cirrhosis of liver without mention of alcohol     ct on 06/06/13= Subtle nodular contour of the liver. Cirrhosis cannot be excluded.   . PUD (peptic ulcer disease)   . Tubular adenoma   . Chronic abdominal pain   . Drug-seeking behavior     per [facility] admission records 01/04/14  . Chronic hypotension   . History of ETOH abuse    Past Surgical History  Past Surgical History  Procedure Laterality Date  . Cholecystectomy    . Tubal ligation    . Femur fracture surgery    . Ankle surgery    .  Back surgery    . Esophagogastroduodenoscopy N/A 03/21/2013    Dr. Gala Romney: severe erosive reflux esophagitis, hiatal hernia, gastric ulcer with element of partial gastric outlet obstruction secondary to ulcer effect. Likely r/t NSAIDs. negative H.pylori  . Colonoscopy with esophagogastroduodenoscopy (egd) N/A 06/24/2013    Dr. Gala Romney: Erosive reflux esophagitis-much improved over that seen previously. Hiatal hernia. Previously noted gastric ulcer completely healed.  Portal gastropathy/TCS:Rectal varices/anal canal hemorrhoids. Portal colopathy.Colonic diverticulosis. Colonic polyp--tubular adenoma   Family History  Family History  Problem Relation Age of Onset  . Colon cancer Neg Hx   . Kidney disease Mother    Social  History  reports that she has been smoking Cigarettes.  She has been smoking about 0.50 packs per day. She does not have any smokeless tobacco history on file. She reports that she does not drink alcohol or use illicit drugs. Allergies  Allergies  Allergen Reactions  . Neurontin [Gabapentin] Anaphylaxis  . Acetaminophen Itching and Nausea And Vomiting  . Aspirin     Intolerance because of peptic ulcer  . Eggs Or Egg-Derived Products Nausea And Vomiting  . Tramadol     Nausea and vomiting  . Codeine Itching and Nausea And Vomiting    Patient is able to take morphine, demerol and fentanyl with out any type of side effects   Home medications Prior to Admission medications   Medication Sig Start Date End Date Taking? Authorizing Provider  cephALEXin (KEFLEX) 500 MG capsule Take 1 capsule (500 mg total) by mouth 2 (two) times daily. Patient taking differently: Take 500 mg by mouth 3 (three) times daily. 10 day course starting on 04/26/2014 01/14/14  Yes Donne Hazel, MD  clotrimazole (LOTRIMIN) 1 % cream Apply 1 application topically 2 (two) times daily.   Yes Historical Provider, MD  cyanocobalamin (,VITAMIN B-12,) 1000 MCG/ML injection Inject 1,000 mcg into the muscle every 30 (thirty) days. Sunday   Yes Historical Provider, MD  furosemide (LASIX) 40 MG tablet Take 1 tablet (40 mg total) by mouth every morning. Patient taking differently: Take 40-80 mg by mouth 2 (two) times daily. 80mg  in the morning and 40mg  at bedtime 01/13/14  Yes Donne Hazel, MD  ipratropium-albuterol (DUONEB) 0.5-2.5 (3) MG/3ML SOLN Take 3 mLs by nebulization every 6 (six) hours as needed (for shortness of breath).    Yes Historical Provider, MD  lactulose (CHRONULAC) 10 GM/15ML solution TAKE TWO TABLESPOONSFUL (30 ML) BY MOUTH TWICE DAILY 12/15/13  Yes Orvil Feil, NP  levothyroxine (SYNTHROID, LEVOTHROID) 50 MCG tablet Take 50 mcg by mouth daily before breakfast.   Yes Historical Provider, MD  pantoprazole (PROTONIX)  40 MG tablet Take 1 tablet (40 mg total) by mouth 2 (two) times daily before a meal. 03/26/13  Yes Samuella Cota, MD  pregabalin (LYRICA) 50 MG capsule Take 50-100 mg by mouth 2 (two) times daily. 1 in the morning and two at bedtime   Yes Historical Provider, MD  spironolactone (ALDACTONE) 100 MG tablet Take 100 mg by mouth every morning.   Yes Historical Provider, MD  thiamine 100 MG tablet Take 100 mg by mouth daily.   Yes Historical Provider, MD  hydrocortisone (ANUSOL-HC) 25 MG suppository Place 1 suppository (25 mg total) rectally 2 (two) times daily. Patient not taking: Reported on 04/28/2014 01/13/14   Donne Hazel, MD  morphine (MSIR) 15 MG tablet Take 1 tablet (15 mg total) by mouth 4 (four) times daily. 02/20/14   Babette Relic, MD   Liver  Function Tests  Recent Labs Lab 04/28/14 1745 04/29/14 0355  AST 33 35  ALT 14 20  ALKPHOS 110 100  BILITOT 0.9 1.1  PROT 6.3 5.8*  ALBUMIN 2.4* 2.0*    Recent Labs Lab 04/28/14 1745  LIPASE 25   CBC  Recent Labs Lab 04/28/14 1745 04/29/14 0355  WBC 5.5 5.1  NEUTROABS 3.6  --   HGB 7.8* 7.7*  HCT 22.6* 21.7*  MCV 97.4 95.2  PLT 117* 413*   Basic Metabolic Panel  Recent Labs Lab 04/28/14 1745 04/29/14 0355  NA 133* 132*  K 4.1 4.5  CL 102 104  CO2 17* 20  GLUCOSE 104* 86  BUN 56* 48*  CREATININE 3.44* 3.06*  CALCIUM 8.8 8.0*    Filed Vitals:   04/29/14 1230 04/29/14 1300 04/29/14 1330 04/29/14 1400  BP: 79/51 86/52 92/57  112/95  Pulse: 92   88  Temp:      TempSrc:      Resp: 17 15 13 15   Height:      Weight:      SpO2: 97%   99%   Exam Chronically ill appearing, tearful No rash, cyanosis or gangrene Sclera anicteric, throat clear No jvd Chest insp rales R base, L base some exp wheezes RRR no MRG Abd marked ascites, nontender, +BS 2+ pitting edema bilat hip areas and 1+ edema LE's Neuro mild asterixis, Ox 3  CXR 1/15 no active disease UA TNTC wbc's, 3-6 rbc, +bact, 1.015, protein  neg   Assessment: 1. Acute renal failure - likely hepatorenal syndrome, sending UNa. Creat down but his is only hemodilution as she is not making adequate urine.  If aggressive Rx desired, should have vasopressors to get BP up over SBP 105-110 or higher, in addition to IV albumin 100 gm / day in divided doses.  Prognosis is not good in my opinion with or without aggressive treatment.  She is not a candidate for RRT with severe comorbidities and debility.  Have d/w primary MD. 2. Cirrhosis / ascites 3. Volume - is vol overloaded at this time , CXR yest was neg for edema  Rec - as above  Kelly Splinter MD (pgr) [phone]    ([phone] 04/29/2014, 2:33 PM

## 2014-04-29 NOTE — Procedures (Signed)
US guided diagnostic/therapeutic paracentesis performed yielding 2 liters (maximum ordered) yellow fluid. A portion of the fluid was sent to the lab for preordered studies. No immediate complications.

## 2014-04-29 NOTE — Progress Notes (Signed)
INITIAL NUTRITION ASSESSMENT  DOCUMENTATION CODES Per approved criteria  -Not Applicable   INTERVENTION: Continue Resource Breeze po TID, each supplement provides 250 kcal and 9 grams of protein  NUTRITION DIAGNOSIS: Increased nutrient needs related to wound as evidenced by estimated needs.   Goal: Pt to meet >/= 90% of their estimated nutrition needs   Monitor:  PO intake  Reason for Assessment: Pt identified as at nutrition risk on the Malnutrition Screen Tool  54 y.o. female  Admitting Dx: Weakness  ASSESSMENT: Pt with hx of guillain-barre, blind, hepatitis C, cirrhosis, and ETOH.  Pt being treated for sepsis, had ascites with plan for paracentesis, AKI, elevated ammonia, and stage II ulcer.  Pt in ultrasound for paracentesis.  Difficult to determine weight hx with ascites.  Pt has been educated multiple times on previous visits (6 visits in 2015)  Height: Ht Readings from Last 1 Encounters:  04/29/14 5\' 4"  (1.626 m)    Weight: Wt Readings from Last 1 Encounters:  04/29/14 179 lb 10.8 oz (81.5 kg)    Ideal Body Weight: 54 kg   % Ideal Body Weight: 151%  Wt Readings from Last 10 Encounters:  04/29/14 179 lb 10.8 oz (81.5 kg)  02/20/14 175 lb (79.379 kg)  01/14/14 171 lb 4.8 oz (77.701 kg)  10/30/13 175 lb 4.8 oz (79.516 kg)  06/24/13 170 lb (77.111 kg)  06/20/13 176 lb (79.833 kg)  06/14/13 191 lb 9.3 oz (86.9 kg)  03/19/13 198 lb 10.2 oz (90.1 kg)  01/17/13 180 lb (81.647 kg)  11/01/12 201 lb 11.5 oz (91.5 kg)    Usual Body Weight: 170-175 lb   % Usual Body Weight: >100% (ascites)  BMI:  Body mass index is 30.83 kg/(m^2). (ascites)  Estimated Nutritional Needs: Kcal: 1800-2000 Protein: 100-120 grams Fluid: > 1.8 L/day  Skin: stage II on sacrum   Diet Order: Diet clear liquid  EDUCATION NEEDS: -No education needs identified at this time   Intake/Output Summary (Last 24 hours) at 04/29/14 1018 Last data filed at 04/29/14 0947  Gross per  24 hour  Intake   3400 ml  Output    175 ml  Net   3225 ml    Last BM: PTA   Labs:   Recent Labs Lab 04/28/14 1745 04/29/14 0355  NA 133* 132*  K 4.1 4.5  CL 102 104  CO2 17* 20  BUN 56* 48*  CREATININE 3.44* 3.06*  CALCIUM 8.8 8.0*  GLUCOSE 104* 86    CBG (last 3)  No results for input(s): GLUCAP in the last 72 hours.  Scheduled Meds: . albumin human  50 g Intravenous TID  . antiseptic oral rinse  7 mL Mouth Rinse q12n4p  . chlorhexidine  15 mL Mouth Rinse BID  . feeding supplement (RESOURCE BREEZE)  1 Container Oral TID BM  . lactulose  30 g Oral BID  . levothyroxine  50 mcg Oral QAC breakfast  . midodrine  10 mg Oral TID WC  . pantoprazole  40 mg Oral BID AC  . piperacillin-tazobactam (ZOSYN)  IV  2.25 g Intravenous Q8H  . [START ON 04/30/2014] vancomycin  1,000 mg Intravenous Q48H    Continuous Infusions: . sodium chloride 75 mL/hr at 04/29/14 0014    Past Medical History  Diagnosis Date  . Guillain-Barre   . Blind   . Neuropathy   . Thyroid disease   . Back pain   . Arthritis   . Seizures   . UTI (lower urinary tract  infection)   . Anxiety   . Depression   . DDD (degenerative disc disease), lumbar   . Hepatitis C antibody test positive     HCV RNA is negative 03/2013  . Mild diastolic dysfunction 1/77/9390  . Esophagitis, erosive 03/2014  . Hepatic steatosis 04/23/2013  . Hypomagnesemia 04/23/2013  . Cirrhosis of liver without mention of alcohol     ct on 06/06/13= Subtle nodular contour of the liver. Cirrhosis cannot be excluded.   . PUD (peptic ulcer disease)   . Tubular adenoma   . Chronic abdominal pain   . Drug-seeking behavior     per [facility] admission records 01/04/14  . Chronic hypotension   . History of ETOH abuse     Past Surgical History  Procedure Laterality Date  . Cholecystectomy    . Tubal ligation    . Femur fracture surgery    . Ankle surgery    . Back surgery    . Esophagogastroduodenoscopy N/A 03/21/2013     Dr. Gala Romney: severe erosive reflux esophagitis, hiatal hernia, gastric ulcer with element of partial gastric outlet obstruction secondary to ulcer effect. Likely r/t NSAIDs. negative H.pylori  . Colonoscopy with esophagogastroduodenoscopy (egd) N/A 06/24/2013    Dr. Gala Romney: Erosive reflux esophagitis-much improved over that seen previously. Hiatal hernia. Previously noted gastric ulcer completely healed.  Portal gastropathy/TCS:Rectal varices/anal canal hemorrhoids. Portal colopathy.Colonic diverticulosis. Colonic polyp--tubular adenoma    Maylon Peppers RD, LDN, CNSC [phone] Pager [phone] After Hours Pager

## 2014-04-29 NOTE — Progress Notes (Addendum)
PATIENT DETAILS Name: [name] Age: 54 y.o. Sex: female Date of Birth: [birth year] Admit Date: 04/28/2014 Admitting Physician Theressa Millard, MD [id number], Selinda Flavin, MD  Subjective: No major events overnight, complains of pain "all over"  Assessment/Plan: Active Problems:   ARF: Suspect this is mostly prerenal azotemia given hypertension and diuretic use. However given her history of liver cirrhosis, hepatorenal syndrome is a possibility. She has tense ascites, and 2+ pitting edema. We will minimize IV fluids, and instead give albumin. I have consulted nephrology, urine sodium pending.    Hepatic encephalopathy: Start lactulose, mildly encephalopathic. Follow.    Hypotension: Reviewed prior hospitalization records, patient seems to have chronic hypotension. Suspect this has worsened from diuretic and narcotic use. Cortisol level on 9/30 appropriate. Although hypotensive, seems to be mostly asymptomatic-and renal function seems to be improving. 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 However unfortunately because of hypotension, cannot use diuretics at this time. Will manage with when necessary paracentesis and albumin infusion. Seems to be slowly deteriorating, numerous recent hospitalization, have consulted palliative care    ?Sepsis: Not sure of presentation is consistent with sepsis, UA not a good sample. Await ascites fluid analysis to rule out SBP. For now continue with empiric antibiotics, if culture data continues to be negative, suspect we can stop all antibiotics.     Anemia: No overt evidence of blood loss. Recent endoscopy on 06/2013 showed no esophageal varices. We'll continue to monitor and transfuse as needed.     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 Was supposed to see neurology as an outpatient. For now we need to concentrate on acute renal failure, hypertension. Is able to move bilateral leg side-to-side, and able to lift against gravity. Will have PT evaluate. Once acute issues are resolved, neurology consultation can be obtained.   Disposition: Remain inpatient  Antibiotics:  See below   Anti-infectives    Start     Dose/Rate Route Frequency Ordered Stop   04/30/14 1600  vancomycin (VANCOCIN) IVPB 1000 mg/200 mL premix  Status:  Discontinued     1,000 mg200 mL/hr over 60 Minutes Intravenous Every 48 hours 04/28/14 2121 04/29/14 1206   04/29/14 2000  vancomycin (VANCOCIN) IVPB 1000 mg/200 mL premix     1,000 mg200 mL/hr over 60 Minutes Intravenous Every 24 hours 04/29/14 1206     04/29/14 1200  piperacillin-tazobactam (ZOSYN) IVPB 3.375 g     3.375 g12.5 mL/hr over 240 Minutes Intravenous Every 8 hours 04/29/14 1141     04/29/14 0400  piperacillin-tazobactam (ZOSYN) IVPB 2.25 g  Status:  Discontinued     2.25 g100 mL/hr over 30 Minutes Intravenous Every 8 hours 04/28/14 2121 04/29/14 1141   04/28/14 1930  piperacillin-tazobactam (ZOSYN) IVPB 3.375 g     3.375 g100 mL/hr over 30 Minutes Intravenous  Once 04/28/14 1926 04/28/14 2023   04/28/14 1930  vancomycin (VANCOCIN) IVPB 1000 mg/200 mL premix     1,000 mg200 mL/hr over 60 Minutes Intravenous  Once 04/28/14 1926 04/28/14 2053  DVT Prophylaxis:  SCD's  Code Status: Full code   Family Communication Spouse at bedside  Procedures: Paracentesis 1/16   CONSULTS:  Palliative Care   Nephrology   Time spent 40 minutes-which includes 50% of the time with face-to-face with patient/ family and coordinating care  related to the above assessment and plan.  MEDICATIONS: Scheduled Meds: . albumin human  50 g Intravenous TID  . antiseptic oral rinse  7 mL Mouth Rinse q12n4p  . chlorhexidine  15 mL Mouth Rinse BID  . feeding supplement (RESOURCE BREEZE)  1 Container Oral TID BM  . lactulose  30 g Oral BID  . levothyroxine  50 mcg Oral QAC breakfast  . lidocaine (PF)      . midodrine  10 mg Oral TID WC  . pantoprazole  40 mg Oral BID AC  . piperacillin-tazobactam (ZOSYN)  IV  3.375 g Intravenous Q8H  . vancomycin  1,000 mg Intravenous Q24H   Continuous Infusions: . sodium chloride 75 mL/hr at 04/29/14 0014   PRN Meds:.ipratropium-albuterol, morphine injection, ondansetron **OR** ondansetron (ZOFRAN) IV    PHYSICAL EXAM: Vital signs in last 24 hours: Filed Vitals:   04/29/14 1230 04/29/14 1300 04/29/14 1330 04/29/14 1400  BP: 79/51 86/52 92/57  112/95  Pulse: 92   88  Temp:      TempSrc:      Resp: 17 15 13 15   Height:      Weight:      SpO2: 97%   99%    Weight change:  Filed Weights   04/28/14 1733 04/29/14 0000  Weight: 72.576 kg (160 lb) 81.5 kg (179 lb 10.8 oz)   Body mass index is 30.83 kg/(m^2).   Gen Exam: Awake and alert, slightly lethargic  Neck: Supple, No JVD.   Chest: B/L Clear.   CVS: S1 S2 Regular, no murmurs.  Abdomen: soft, BS +, non tender, non distended.  Extremities: no edema, lower extremities warm to touch. Neurologic: 3/5-4/5-b/l lower ext weakness Skin: No Rash.    Intake/Output from previous day:  Intake/Output Summary (Last 24 hours) at 04/29/14 1432 Last data filed at 04/29/14 1231  Gross per 24 hour  Intake   3550 ml  Output    525 ml  Net   3025 ml     LAB RESULTS: CBC  Recent Labs Lab 04/28/14 1745 04/29/14 0355  WBC 5.5 5.1  HGB 7.8* 7.7*  HCT 22.6* 21.7*  PLT 117* 109*  MCV 97.4 95.2  MCH 33.6 33.8  MCHC 34.5 35.5  RDW 15.0 14.7  LYMPHSABS 1.2  --   MONOABS 0.5  --   EOSABS 0.1  --   BASOSABS 0.0  --     Chemistries     Recent Labs Lab 04/28/14 1745 04/29/14 0355  NA 133* 132*  K 4.1 4.5  CL 102 104  CO2 17* 20  GLUCOSE 104* 86  BUN 56* 48*  CREATININE 3.44* 3.06*  CALCIUM 8.8 8.0*    CBG: No results for input(s): GLUCAP in the last 168 hours.  GFR Estimated Creatinine Clearance: 22 mL/min (by C-G formula based on Cr of 3.06).  Coagulation profile  Recent Labs Lab 04/28/14 1745  INR 1.18    Cardiac Enzymes No results for input(s): CKMB, TROPONINI, MYOGLOBIN in the last 168 hours.  Invalid input(s): CK  Invalid input(s): POCBNP No results for input(s): DDIMER in the last 72 hours. No results for input(s): HGBA1C in the last 72 hours. No results for input(s): CHOL, HDL, LDLCALC,  TRIG, CHOLHDL, LDLDIRECT in the last 72 hours. No results for input(s): TSH, T4TOTAL, T3FREE, THYROIDAB in the last 72 hours.  Invalid input(s): FREET3 No results for input(s): VITAMINB12, FOLATE, FERRITIN, TIBC, IRON, RETICCTPCT in the last 72 hours.  Recent Labs  04/28/14 1745  LIPASE 25    Urine Studies No results for input(s): UHGB, CRYS in the last 72 hours.  Invalid input(s): UACOL, UAPR, USPG, UPH, UTP, UGL, UKET, UBIL, UNIT, UROB, ULEU, UEPI, UWBC, URBC, UBAC, CAST, UCOM, BILUA  MICROBIOLOGY: Recent Results (from the past 240 hour(s))  Culture, blood (routine x 2)     Status: None (Preliminary result)   Collection Time: 04/28/14  5:45 PM  Result Value Ref Range Status   Specimen Description BLOOD LEFT HAND  Final   Special Requests BOTTLES DRAWN AEROBIC ONLY 5CC  Final   Culture PENDING  Incomplete   Report Status PENDING  Incomplete  Culture, blood (routine x 2)     Status: None (Preliminary result)   Collection Time: 04/28/14  6:33 PM  Result Value Ref Range Status   Specimen Description BLOOD LEFT HAND  Final   Special Requests BOTTLES DRAWN AEROBIC AND ANAEROBIC 5CC  Final   Culture PENDING  Incomplete   Report Status PENDING  Incomplete  MRSA PCR Screening     Status: None    Collection Time: 04/29/14  1:26 AM  Result Value Ref Range Status   MRSA by PCR NEGATIVE NEGATIVE Final    Comment:        The GeneXpert MRSA Assay (FDA approved for NASAL specimens only), is one component of a comprehensive MRSA colonization surveillance program. It is not intended to diagnose MRSA infection nor to guide or monitor treatment for MRSA infections.     RADIOLOGY STUDIES/RESULTS: US Paracentesis  04/29/2014   INDICATION: Patient with history of hepatitis-C, cirrhosis, hypotension, renal insufficiency, recurrent ascites. Request is made for diagnostic and therapeutic paracentesis up to 2 liters.  EXAM: ULTRASOUND-GUIDED DIAGNOSTIC AND THERAPEUTIC PARACENTESIS  COMPARISON:  Prior paracentesis on 01/10/2014  MEDICATIONS: None.  COMPLICATIONS: None immediate  TECHNIQUE: Informed written consent was obtained from the patient after a discussion of the risks, benefits and alternatives to treatment. A timeout was performed prior to the initiation of the procedure.  Initial ultrasound scanning demonstrates a large amount of ascites within the right lower abdominal quadrant. The right lower abdomen was prepped and draped in the usual sterile fashion. 1% lidocaine was used for local anesthesia. Under direct ultrasound guidance, a 19 gauge, 7-cm, Yueh catheter was introduced. An ultrasound image was saved for documentation purposed. The paracentesis was performed. The catheter was removed and a dressing was applied. The patient tolerated the procedure well without immediate post procedural complication.  FINDINGS: A total of approximately 2 liters of yellow fluid was removed. Samples were sent to the laboratory as requested by the clinical team.  IMPRESSION: Successful ultrasound-guided diagnostic and therapeutic paracentesis yielding 2 liters of peritoneal fluid.  Read by: Rowe Robert, PA-C   Electronically Signed   By: Markus Daft M.D.   On: 04/29/2014 12:55   Dg Chest Port 1  View  04/28/2014   CLINICAL DATA:  Weakness, shortness of breath and confusion for several days.  EXAM: PORTABLE CHEST - 1 VIEW  COMPARISON:  Single view of the dens 02/20/2014 and 01/11/2014.  FINDINGS: Lungs are clear. Heart size is normal. There is no pneumothorax or pleural effusion.  IMPRESSION: Negative exam.   Electronically Signed   By: Marcello Moores  Dalessio M.D.   On: 04/28/2014 18:50    Oren Binet, MD  [facility] Pager:[phone]  If 7PM-7AM, please contact night-coverage [email or web address] Password [email or web address] 04/29/2014, 2:32 PM   LOS: 1 day

## 2014-04-30 ENCOUNTER — Inpatient Hospital Stay (HOSPITAL_COMMUNITY): Payer: Medicare Other

## 2014-04-30 DIAGNOSIS — R188 Other ascites: Secondary | ICD-10-CM

## 2014-04-30 DIAGNOSIS — D649 Anemia, unspecified: Secondary | ICD-10-CM

## 2014-04-30 LAB — BASIC METABOLIC PANEL
ANION GAP: 3 — AB (ref 5–15)
BUN: 39 mg/dL — ABNORMAL HIGH (ref 6–23)
CO2: 22 mmol/L (ref 19–32)
Calcium: 8 mg/dL — ABNORMAL LOW (ref 8.4–10.5)
Chloride: 110 mEq/L (ref 96–112)
Creatinine, Ser: 2.45 mg/dL — ABNORMAL HIGH (ref 0.50–1.10)
GFR calc non Af Amer: 21 mL/min — ABNORMAL LOW (ref >90)
GFR, EST AFRICAN AMERICAN: 25 mL/min — AB (ref >90)
GLUCOSE: 81 mg/dL (ref 70–99)
POTASSIUM: 3.3 mmol/L — AB (ref 3.5–5.1)
SODIUM: 135 mmol/L (ref 135–145)

## 2014-04-30 LAB — HEPATIC FUNCTION PANEL
ALT: 10 U/L (ref 0–35)
AST: 26 U/L (ref 0–37)
Albumin: 3.5 g/dL (ref 3.5–5.2)
Alkaline Phosphatase: 56 U/L (ref 39–117)
Bilirubin, Direct: 0.7 mg/dL — ABNORMAL HIGH (ref 0.0–0.3)
Indirect Bilirubin: 1.1 mg/dL — ABNORMAL HIGH (ref 0.3–0.9)
TOTAL PROTEIN: 5.9 g/dL — AB (ref 6.0–8.3)
Total Bilirubin: 1.8 mg/dL — ABNORMAL HIGH (ref 0.3–1.2)

## 2014-04-30 LAB — AMMONIA: AMMONIA: 107 umol/L — AB (ref 11–32)

## 2014-04-30 LAB — CBC
HEMATOCRIT: 16.7 % — AB (ref 36.0–46.0)
Hemoglobin: 5.8 g/dL — CL (ref 12.0–15.0)
MCH: 32.8 pg (ref 26.0–34.0)
MCHC: 34.7 g/dL (ref 30.0–36.0)
MCV: 94.4 fL (ref 78.0–100.0)
Platelets: 78 10*3/uL — ABNORMAL LOW (ref 150–400)
RBC: 1.77 MIL/uL — AB (ref 3.87–5.11)
RDW: 15 % (ref 11.5–15.5)
WBC: 3.5 10*3/uL — ABNORMAL LOW (ref 4.0–10.5)

## 2014-04-30 LAB — TSH: TSH: 0.914 u[IU]/mL (ref 0.350–4.500)

## 2014-04-30 LAB — OCCULT BLOOD X 1 CARD TO LAB, STOOL: FECAL OCCULT BLD: POSITIVE — AB

## 2014-04-30 LAB — PREPARE RBC (CROSSMATCH)

## 2014-04-30 LAB — ABO/RH: ABO/RH(D): B POS

## 2014-04-30 MED ORDER — MORPHINE SULFATE (CONCENTRATE) 10 MG/0.5ML PO SOLN
15.0000 mg | ORAL | Status: DC | PRN
  Administered 2014-04-30 – 2014-05-01 (×2): 15 mg via ORAL
  Filled 2014-04-30 (×2): qty 1

## 2014-04-30 MED ORDER — LORAZEPAM 1 MG PO TABS: 1.0000 mg | ORAL_TABLET | ORAL | Status: DC | PRN

## 2014-04-30 MED ORDER — LORAZEPAM 2 MG/ML IJ SOLN
1.0000 mg | Freq: Four times a day (QID) | INTRAMUSCULAR | Status: DC | PRN
  Administered 2014-04-30: 1 mg via INTRAVENOUS
  Filled 2014-04-30: qty 1

## 2014-04-30 MED ORDER — PROMETHAZINE HCL 25 MG/ML IJ SOLN
25.0000 mg | Freq: Four times a day (QID) | INTRAMUSCULAR | Status: DC | PRN
  Administered 2014-04-30: 25 mg via INTRAVENOUS
  Filled 2014-04-30: qty 1

## 2014-04-30 MED ORDER — SODIUM CHLORIDE 0.9 % IV BOLUS (SEPSIS)
1000.0000 mL | Freq: Once | INTRAVENOUS | Status: AC
  Administered 2014-04-30: 1000 mL via INTRAVENOUS

## 2014-04-30 MED ORDER — SODIUM CHLORIDE 0.9 % IV SOLN: Freq: Once | INTRAVENOUS | Status: DC

## 2014-04-30 MED ORDER — LIDOCAINE HCL (PF) 1 % IJ SOLN
INTRAMUSCULAR | Status: AC
  Filled 2014-04-30: qty 10

## 2014-04-30 MED ORDER — ALBUMIN HUMAN 25 % IV SOLN
50.0000 g | Freq: Two times a day (BID) | INTRAVENOUS | Status: DC
  Filled 2014-04-30: qty 200

## 2014-04-30 MED ORDER — LORAZEPAM 2 MG/ML PO CONC: 1.0000 mg | ORAL | Status: DC | PRN

## 2014-04-30 MED ORDER — MORPHINE SULFATE 4 MG/ML IJ SOLN
4.0000 mg | INTRAMUSCULAR | Status: DC | PRN
  Administered 2014-04-30 – 2014-05-01 (×4): 4 mg via INTRAVENOUS
  Filled 2014-04-30 (×4): qty 1

## 2014-04-30 NOTE — Procedures (Addendum)
US guided therapeutic bedside paracentesis performed yielding 5 liters (maximum ordered)  yellow fluid. No immediate complications. Postprocedure BP 78/59.

## 2014-04-30 NOTE — Progress Notes (Signed)
Orders for blood transfusion. Pt unable to sign consent. Attempted to call pt's husband but he did not answer. Will continue to attempt to contact him. No s/s of acute distress noted.

## 2014-04-30 NOTE — Progress Notes (Addendum)
PATIENT DETAILS Name: [name] Age: 54 y.o. Sex: female Date of Birth: 31-Jul-1960 Admit Date: 04/28/2014 Admitting Physician Theressa Millard, MD [id number], Selinda Flavin, MD  Subjective: Continues to be intermittently hypotensive. 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 Unfortunately, irrespective of etiology, given ongoing hypotension, overall clinical deterioration over the past few months, suspect that her prognosis is very grim. Per nephrology, she is not a candidate for long-term dialysis. Seen by pulmonary critical care on 04/29/14, after explaining to patient and family that patient would need invasive hemodynamic monitoring with central line, pressors and likely intubated for ventilator, both patient and husband decided against these aggressive measures. She was then made a DO NOT RESUSCITATE. Stop IV fluids, as she still is significantly volume overloaded. After a prolonged discussion with patient, and husband, we have now decided to mostly focus on comfort. Family and patient agreeable to slowly withdrawing some of her medications.     Hepatic encephalopathy: Continue with lactulose as much as possible, still mildly encephalopathic. Follow.    Hypotension: Reviewed prior hospitalization records, patient seems to have chronic hypotension. Suspect this has worsened from diuretic and narcotic use. Cortisol level on 9/30 appropriate. Suspect that this is also causing her to go into renal failure. Started on intravenous and albumin with very limited success, blood pressure continues to be soft and patient continues to have intermittent hypotension. Since we have transitioned to comfort care, we will start narcotics to make patient comfortable. We'll slowly minimize other medications at this time.    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 Ascites fluid analysis not consistent with as BP, and all antibiotics now discontinued. Since continues to deteriorate, and has rapid accumulation of ascites, underwent 5 L of paracentesis on 04/30/14. Focuses mostly on comfort, okay for large volume paracentesis. Given soft BP, unable to use diuretics, but no focus mostly on comfort.    ?Sepsis: Not sure of presentation is consistent with sepsis, UA not a good sample. Ascites fluid analysis not consistent with SBP, so antibiotics discontinued. Focuses mostly on comfort.      Anemia: No overt evidence of blood loss, however think that this is mostly secondary to worsening renal function, acute illness. Transfuse 2 units of PRBC on 1/17, since now being transitioned to comfort care, no further transfusion will be done. Recent endoscopy on 06/2013 showed no esophageal varices.      Stage II decubitus ulcer: Supportive care.     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 Was supposed to see neurology as an outpatient.  Is able to move bilateral leg side-to-side, and able to lift against gravity. No further workup required as now comfort care.     Palliative care/ethics:  -Seen by PCCM on 04/29/14 after explaining to patient and family that patient would need invasive hemodynamic monitoring with central line, pressors and likely intubated for ventilator, both patient and husband decided against these aggressive measures. She was then made a DO NOT RESUSCITATE.  -After bedside discussion with husband, and patient by this M.D. on 04/30/14  we have decided on the following   -Focus mostly on comfort and symptom management   -Will start  narcotics to alleviate pain (has chronic pain) irrespective of hypotension   -Large volume paracentesis as needed for tense ascites irrespective of renal function and hypotension   -Minimize some of her medications   -If family unable to take care of patient home, then will need residential hospice on discharge.CM/SW                                        consulted   Disposition: Remain inpatient-Home Hospice vs Residential Hospice on discharge  Antibiotics:  See below   Anti-infectives    Start     Dose/Rate Route Frequency Ordered Stop   04/30/14 1600  vancomycin (VANCOCIN) IVPB 1000 mg/200 mL premix  Status:  Discontinued     1,000 mg200 mL/hr over 60 Minutes Intravenous Every 48 hours 04/28/14 2121 04/29/14 1206   04/29/14 2000  vancomycin (VANCOCIN) IVPB 1000 mg/200 mL premix  Status:  Discontinued     1,000 mg200 mL/hr over 60 Minutes Intravenous Every 24 hours 04/29/14 1206 04/30/14 1017   04/29/14 1200  piperacillin-tazobactam (ZOSYN) IVPB 3.375 g  Status:  Discontinued     3.375 g12.5 mL/hr over 240 Minutes Intravenous Every 8 hours 04/29/14 1141 04/30/14 1017   04/29/14 0400  piperacillin-tazobactam (ZOSYN) IVPB 2.25 g  Status:  Discontinued     2.25 g100 mL/hr over 30 Minutes Intravenous Every 8 hours 04/28/14 2121 04/29/14 1141   04/28/14 1930  piperacillin-tazobactam (ZOSYN) IVPB 3.375 g     3.375 g100 mL/hr over 30 Minutes Intravenous  Once 04/28/14 1926 04/28/14 2023   04/28/14 1930  vancomycin (VANCOCIN) IVPB 1000 mg/200 mL premix     1,000 mg200 mL/hr over 60 Minutes Intravenous  Once 04/28/14 1926 04/28/14 2053      DVT Prophylaxis:  SCD's  Code Status: Full code   Family Communication Spouse at bedside  Procedures: Paracentesis 1/16,1/17  CONSULTS:  Palliative Care   Nephrology   PCCM  MEDICATIONS: Scheduled Meds: . albumin human  50 g Intravenous BID  . antiseptic oral rinse  7 mL Mouth Rinse q12n4p  . chlorhexidine  15 mL Mouth Rinse BID  .  feeding supplement (RESOURCE BREEZE)  1 Container Oral TID BM  . lactulose  30 g Oral BID  . levothyroxine  50 mcg Oral QAC breakfast  . lidocaine (PF)      . midodrine  10 mg Oral TID WC   Continuous Infusions:   PRN Meds:.ipratropium-albuterol, LORazepam, morphine injection, morphine CONCENTRATE, promethazine    PHYSICAL EXAM: Vital signs in last 24 hours: Filed Vitals:   04/30/14 1220 04/30/14 1231 04/30/14 1232 04/30/14 1247  BP: 75/54 78/59  81/62  Pulse: 90 91 91 94  Temp:  97.6 F (36.4 C)  98.1 F (36.7 C)  TempSrc:  Axillary  Axillary  Resp: 6 6 7 14   Height:      Weight:      SpO2: 97% 96% 97% 96%    Weight change: 11.225 kg (24 lb 11.9 oz) Filed Weights   04/28/14 1733 04/29/14 0000 04/30/14 0315  Weight: 72.576 kg (160 lb) 81.5 kg (179 lb 10.8 oz) 83.8 kg (184 lb 11.9 oz)   Body mass index is 31.7 kg/(m^2).   Gen Exam: Awake and alert, lethargic -confused at times Neck: Supple, No JVD.  Chest: B/L Clear.   CVS: S1 S2 Regular, no murmurs.  Abdomen: soft, BS +, non tender, tense and distended.  Extremities: no edema, lower extremities warm to touch. Neurologic: 3/5-4/5-b/l lower ext weakness Skin: No Rash.    Intake/Output from previous day:  Intake/Output Summary (Last 24 hours) at 04/30/14 1422 Last data filed at 04/30/14 1247  Gross per 24 hour  Intake 2679.08 ml  Output   1050 ml  Net 1629.08 ml     LAB RESULTS: CBC  Recent Labs Lab 04/28/14 1745 04/29/14 0355 04/30/14 0345  WBC 5.5 5.1 3.5*  HGB 7.8* 7.7* 5.8*  HCT 22.6* 21.7* 16.7*  PLT 117* 109* 78*  MCV 97.4 95.2 94.4  MCH 33.6 33.8 32.8  MCHC 34.5 35.5 34.7  RDW 15.0 14.7 15.0  LYMPHSABS 1.2  --   --   MONOABS 0.5  --   --   EOSABS 0.1  --   --   BASOSABS 0.0  --   --     Chemistries   Recent Labs Lab 04/28/14 1745 04/29/14 0355 04/30/14 0345  NA 133* 132* 135  K 4.1 4.5 3.3*  CL 102 104 110  CO2 17* 20 22  GLUCOSE 104* 86 81  BUN 56* 48* 39*  CREATININE  3.44* 3.06* 2.45*  CALCIUM 8.8 8.0* 8.0*    CBG: No results for input(s): GLUCAP in the last 168 hours.  GFR Estimated Creatinine Clearance: 27.8 mL/min (by C-G formula based on Cr of 2.45).  Coagulation profile  Recent Labs Lab 04/28/14 1745  INR 1.18    Cardiac Enzymes No results for input(s): CKMB, TROPONINI, MYOGLOBIN in the last 168 hours.  Invalid input(s): CK  Invalid input(s): POCBNP No results for input(s): DDIMER in the last 72 hours. No results for input(s): HGBA1C in the last 72 hours. No results for input(s): CHOL, HDL, LDLCALC, TRIG, CHOLHDL, LDLDIRECT in the last 72 hours.  Recent Labs  04/30/14 0345  TSH 0.914   No results for input(s): VITAMINB12, FOLATE, FERRITIN, TIBC, IRON, RETICCTPCT in the last 72 hours.  Recent Labs  04/28/14 1745  LIPASE 25    Urine Studies No results for input(s): UHGB, CRYS in the last 72 hours.  Invalid input(s): UACOL, UAPR, USPG, UPH, UTP, UGL, UKET, UBIL, UNIT, UROB, ULEU, UEPI, UWBC, URBC, UBAC, CAST, UCOM, BILUA  MICROBIOLOGY: Recent Results (from the past 240 hour(s))  Culture, blood (routine x 2)     Status: None (Preliminary result)   Collection Time: 04/28/14  5:45 PM  Result Value Ref Range Status   Specimen Description BLOOD LEFT HAND  Final   Special Requests BOTTLES DRAWN AEROBIC ONLY 5CC  Final   Culture NO GROWTH 2 DAYS  Final   Report Status PENDING  Incomplete  Culture, blood (routine x 2)     Status: None (Preliminary result)   Collection Time: 04/28/14  6:33 PM  Result Value Ref Range Status   Specimen Description BLOOD LEFT HAND  Final   Special Requests BOTTLES DRAWN AEROBIC AND ANAEROBIC 5CC  Final   Culture NO GROWTH 2 DAYS  Final   Report Status PENDING  Incomplete  MRSA PCR Screening     Status: None   Collection Time: 04/29/14  1:26 AM  Result Value Ref Range Status   MRSA by PCR NEGATIVE NEGATIVE Final    Comment:        The GeneXpert MRSA Assay (FDA approved for NASAL  specimens only), is one component of a comprehensive MRSA  colonization surveillance program. It is not intended to diagnose MRSA infection nor to guide or monitor treatment for MRSA infections.   Body fluid culture     Status: None (Preliminary result)   Collection Time: 04/29/14 11:37 AM  Result Value Ref Range Status   Specimen Description PLEURAL ABDOMEN  Final   Special Requests NONE  Final   Gram Stain   Final    RARE WBC PRESENT, PREDOMINANTLY MONONUCLEAR NO ORGANISMS SEEN Performed at [facility]    Culture   Final    NO GROWTH 1 DAY Performed at [facility]    Report Status PENDING  Incomplete    RADIOLOGY STUDIES/RESULTS: US Paracentesis  04/30/2014   INDICATION: Patient with history of hepatitis-C, cirrhosis, hypotension, renal insufficiency, encephalopathy, recurrent ascites. Request is made for therapeutic paracentesis up to 5 liters.  EXAM: ULTRASOUND-GUIDED THERAPEUTIC PARACENTESIS  COMPARISON:  Prior paracentesis on 04/29/2014.  MEDICATIONS: None.  COMPLICATIONS: None immediate  TECHNIQUE: Informed written consent was obtained from the patient after a discussion of the risks, benefits and alternatives to treatment. A timeout was performed prior to the initiation of the procedure.  Initial ultrasound scanning demonstrates a large amount of ascites within the right lower abdominal quadrant. The right lower abdomen was prepped and draped in the usual sterile fashion. 1% lidocaine was used for local anesthesia. Under direct ultrasound guidance, a 19 gauge, 15-cm, Yueh catheter was introduced. An ultrasound image was saved for documentation purposed. The paracentesis was performed. The catheter was removed and a dressing was applied. The patient tolerated the procedure well without immediate post procedural complication.  FINDINGS: A total of approximately 5 liters of yellow fluid was removed.  IMPRESSION: Successful ultrasound-guided therapeutic paracentesis  yielding 5 liters of peritoneal fluid.  Read by: Rowe Robert, PA-C   Electronically Signed   By: Markus Daft M.D.   On: 04/30/2014 12:44   US Paracentesis  04/29/2014   INDICATION: Patient with history of hepatitis-C, cirrhosis, hypotension, renal insufficiency, recurrent ascites. Request is made for diagnostic and therapeutic paracentesis up to 2 liters.  EXAM: ULTRASOUND-GUIDED DIAGNOSTIC AND THERAPEUTIC PARACENTESIS  COMPARISON:  Prior paracentesis on 01/10/2014  MEDICATIONS: None.  COMPLICATIONS: None immediate  TECHNIQUE: Informed written consent was obtained from the patient after a discussion of the risks, benefits and alternatives to treatment. A timeout was performed prior to the initiation of the procedure.  Initial ultrasound scanning demonstrates a large amount of ascites within the right lower abdominal quadrant. The right lower abdomen was prepped and draped in the usual sterile fashion. 1% lidocaine was used for local anesthesia. Under direct ultrasound guidance, a 19 gauge, 7-cm, Yueh catheter was introduced. An ultrasound image was saved for documentation purposed. The paracentesis was performed. The catheter was removed and a dressing was applied. The patient tolerated the procedure well without immediate post procedural complication.  FINDINGS: A total of approximately 2 liters of yellow fluid was removed. Samples were sent to the laboratory as requested by the clinical team.  IMPRESSION: Successful ultrasound-guided diagnostic and therapeutic paracentesis yielding 2 liters of peritoneal fluid.  Read by: Rowe Robert, PA-C   Electronically Signed   By: Markus Daft M.D.   On: 04/29/2014 12:55   Dg Chest Port 1 View  04/28/2014   CLINICAL DATA:  Weakness, shortness of breath and confusion for several days.  EXAM: PORTABLE CHEST - 1 VIEW  COMPARISON:  Single view of the dens 02/20/2014 and 01/11/2014.  FINDINGS: Lungs are clear. Heart size is normal.  There is no pneumothorax or pleural effusion.   IMPRESSION: Negative exam.   Electronically Signed   By: Inge Rise M.D.   On: 04/28/2014 18:50    Oren Binet, MD  [facility] Pager:[phone]  If 7PM-7AM, please contact night-coverage [email or web address] Password [email or web address] 04/30/2014, 2:22 PM   LOS: 2 days

## 2014-04-30 NOTE — Progress Notes (Signed)
Attempted report x 2 

## 2014-04-30 NOTE — Care Management Note (Addendum)
    Page 1 of 1   04/30/2014     3:10:26 PM CARE MANAGEMENT NOTE 04/30/2014  Patient:  [name], [name]   Account Number:  [id number]  Date Initiated:  04/30/2014  Documentation initiated by:  [facility]  Subjective/Objective Assessment:   MMN:[id number] weakness; XY:[id number] of liver     Action/Plan:   [place] planning   Anticipated DC Date:  05/01/2014   Anticipated DC Plan:  [place]  CM consult      Choice offered to / List presented to:             Status of service:  Completed, signed off Medicare Important Message given?   (If response is "NO", the following Medicare IM given date fields will be blank) Date Medicare IM given:   Medicare IM given by:   Date Additional Medicare IM given:   Additional Medicare IM given by:    Discharge Disposition:  [place]  Per UR Regulation:    If discussed at Long Length of Stay Meetings, dates discussed:    Comments:  04/30/14 15:00 Per MD telephone request, CM called CSW Crystal to notify her of pt transfer to floor and pt is ready for hospice placement.  No other CM needs were communicated.  Mariane Masters, BSN, [place] [phone]. 04/30/14 10:20 CM received call from MD requesting I meet with pt and family to discuss hospice.  Pt has NOT had a [place] meeting with Palliative.  Pt states she has a son [name] but defers all decisions to her husband, [name] [phone] C: [phone]); [name] is at the bedside.  Pt very concerned about husband being able to continue working through this journey.  [name] is available for a Palliative  meeting anytime after 3:00pm 1/18 or anytime today. [name] states he thinks a Ecologist would be best because of limited resources and limited family members available for caretaking.  Pt states she spoke, by phone to [place] this morning and all are in agreement.  Waiting for [place] meeting with Palliative; have called CSW to make  aware Palliative has NOT consulted yet but will be following for direction.  Mariane Masters, BSN, CM [phone].

## 2014-04-30 NOTE — Progress Notes (Signed)
CRITICAL VALUE ALERT  Critical value received:  Hgb 5.8  Date of notification:  04/30/14  Time of notification:  0525  Critical value read back:Yes.    Nurse who received alert:  D. Aleene Davidson, RN  MD notified (1st page):  M. Donnal Debar, NP  Time of first page:  0529  MD notified (2nd page):  Time of second page:  Responding MD:  M. Donnal Debar, NP  Time MD responded:  [phone]  New orders received.

## 2014-04-30 NOTE — Progress Notes (Addendum)
Chaplain attempted to do a follow up visit after patient's extended conversation with Sharol Harness last night.  Nurse was feeding patient upon arrival. Initially, patient seemed to indicate understanding that I was a chaplain and appeared to lose interest in eating and to want to talk.  Patient made some statements that neither the RN nor I could understand.  I sat with patient and tried to initiate conversation. Patient's breathing is very labored and patient drifts in and out of sleep, unable to sustain conversation.  After sitting with patient for a few minutes, chaplain offered a silent prayer and left.  Please communicate with family that a visit was made and that spiritual care is available at any time for patient or family.   Minus Liberty, [place]  I called patient's husband to let him know of my attempt to visit patient and of the availability of our services. He asked if I would be here in the morning.  I told him a chaplain would be available and to please ask the nurse to page spiritual care.  He said he would and expressed appreciation for my call.     04/30/14 1700  Clinical Encounter Type  Visited With Patient  Visit Type Spiritual support  Consult/Referral To Chaplain  Stress Factors  Patient Stress Factors Exhausted;Major life changes;Lack of knowledge

## 2014-04-30 NOTE — Progress Notes (Addendum)
REport called to RN on 6N. No s/s of acute distress noted. Will transport to 6N. Husband Sam notified of transfer.

## 2014-04-30 NOTE — Progress Notes (Signed)
Patient transferred to floor and report received from [place], [place].

## 2014-04-30 NOTE — Progress Notes (Signed)
  [facility] Progress Note   Subjective: UOP a little better, 750 cc yest and creat down today to 2.45  Filed Vitals:   04/30/14 0325 04/30/14 0330 04/30/14 0400 04/30/14 0700  BP:  100/73 117/98 79/51  Pulse:  101 99   Temp:    97.5 F (36.4 C)  TempSrc:    Oral  Resp:  13 14 7   Height:      Weight:      SpO2: 95% 97% 100% 100%   Exam: Chronically ill, slurred speech unchanged, responsive, no distress No jvd Chest some faint basilar rales R side RRR no MRG Abd marked ascites, nontender, +BS 2+ bilat LE pitting edema Neuro is drowsy, Ox 3  CXR 1/15 no active disease UA tntc WBC's, 3-6 rbc's, +bact, 1.015, protein neg UNa 26       Assessment: 1. Acute renal failure due to hepatorenal syndrome - improving some, on midodrine 2. UTI - cx pending on Zosyn 3. Advanced cirrhosis / ascites / anasarca / hypotension due to cirrhosis physiology - poor long-term prognosis, on midodrine now  10 mg tid; conservative management, DNR/ no pressors 4. Hx etoh abuse  Plan- hopefully she will continue to improve; nothing to add at this time, will sign off. Please call as needed.     Kelly Splinter MD  pager [phone]    cell [phone]  04/30/2014, 8:58 AM     Recent Labs Lab 04/28/14 1745 04/29/14 0355 04/30/14 0345  NA 133* 132* 135  K 4.1 4.5 3.3*  CL 102 104 110  CO2 17* 20 22  GLUCOSE 104* 86 81  BUN 56* 48* 39*  CREATININE 3.44* 3.06* 2.45*  CALCIUM 8.8 8.0* 8.0*    Recent Labs Lab 04/28/14 1745 04/29/14 0355 04/30/14 0345  AST 33 35 26  ALT 14 20 10   ALKPHOS 110 100 56  BILITOT 0.9 1.1 1.8*  PROT 6.3 5.8* 5.9*  ALBUMIN 2.4* 2.0* 3.5    Recent Labs Lab 04/28/14 1745 04/29/14 0355 04/30/14 0345  WBC 5.5 5.1 3.5*  NEUTROABS 3.6  --   --   HGB 7.8* 7.7* 5.8*  HCT 22.6* 21.7* 16.7*  MCV 97.4 95.2 94.4  PLT 117* 109* 78*   . sodium chloride   Intravenous Once  . albumin human  50 g Intravenous TID  . antiseptic oral rinse  7 mL Mouth  Rinse q12n4p  . chlorhexidine  15 mL Mouth Rinse BID  . feeding supplement (RESOURCE BREEZE)  1 Container Oral TID BM  . lactulose  30 g Oral BID  . levothyroxine  50 mcg Oral QAC breakfast  . midodrine  10 mg Oral TID WC  . pantoprazole  40 mg Oral BID AC  . piperacillin-tazobactam (ZOSYN)  IV  3.375 g Intravenous Q8H  . vancomycin  1,000 mg Intravenous Q24H   . sodium chloride 40 mL/hr at 04/29/14 1900   ipratropium-albuterol, morphine injection, ondansetron **OR** ondansetron (ZOFRAN) IV

## 2014-05-01 DIAGNOSIS — N179 Acute kidney failure, unspecified: Secondary | ICD-10-CM

## 2014-05-01 DIAGNOSIS — Z515 Encounter for palliative care: Secondary | ICD-10-CM

## 2014-05-01 LAB — TYPE AND SCREEN
ABO/RH(D): B POS
Antibody Screen: NEGATIVE
Unit division: 0
Unit division: 0

## 2014-05-01 LAB — CBC
HEMATOCRIT: 27.1 % — AB (ref 36.0–46.0)
HEMOGLOBIN: 9.3 g/dL — AB (ref 12.0–15.0)
MCH: 31.6 pg (ref 26.0–34.0)
MCHC: 34.3 g/dL (ref 30.0–36.0)
MCV: 92.2 fL (ref 78.0–100.0)
Platelets: 80 10*3/uL — ABNORMAL LOW (ref 150–400)
RBC: 2.94 MIL/uL — ABNORMAL LOW (ref 3.87–5.11)
RDW: 16.2 % — ABNORMAL HIGH (ref 11.5–15.5)
WBC: 5.1 10*3/uL (ref 4.0–10.5)

## 2014-05-01 LAB — BASIC METABOLIC PANEL
Anion gap: 8 (ref 5–15)
BUN: 32 mg/dL — ABNORMAL HIGH (ref 6–23)
CALCIUM: 8.9 mg/dL (ref 8.4–10.5)
CHLORIDE: 112 meq/L (ref 96–112)
CO2: 19 mmol/L (ref 19–32)
Creatinine, Ser: 1.66 mg/dL — ABNORMAL HIGH (ref 0.50–1.10)
GFR calc Af Amer: 40 mL/min — ABNORMAL LOW (ref >90)
GFR calc non Af Amer: 34 mL/min — ABNORMAL LOW (ref >90)
Glucose, Bld: 105 mg/dL — ABNORMAL HIGH (ref 70–99)
POTASSIUM: 3.2 mmol/L — AB (ref 3.5–5.1)
Sodium: 139 mmol/L (ref 135–145)

## 2014-05-01 LAB — PATHOLOGIST SMEAR REVIEW

## 2014-05-01 MED ORDER — FUROSEMIDE 40 MG PO TABS
40.0000 mg | ORAL_TABLET | Freq: Every day | ORAL | Status: DC
  Administered 2014-05-01: 40 mg via ORAL
  Filled 2014-05-01: qty 1

## 2014-05-01 MED ORDER — LORAZEPAM 2 MG/ML PO CONC: 1.0000 mg | ORAL | Status: AC | PRN

## 2014-05-01 MED ORDER — MORPHINE SULFATE (CONCENTRATE) 10 MG/0.5ML PO SOLN: 20.0000 mg | ORAL | Status: AC | PRN

## 2014-05-01 MED ORDER — ONDANSETRON HCL 4 MG/2ML IJ SOLN
4.0000 mg | Freq: Four times a day (QID) | INTRAMUSCULAR | Status: DC | PRN
  Administered 2014-05-01: 4 mg via INTRAVENOUS

## 2014-05-01 MED ORDER — ONDANSETRON HCL 4 MG/2ML IJ SOLN
INTRAMUSCULAR | Status: AC
  Filled 2014-05-01: qty 2

## 2014-05-01 MED ORDER — MORPHINE SULFATE (CONCENTRATE) 10 MG /0.5 ML PO SOLN: 20.0000 mg | Freq: Four times a day (QID) | ORAL | Status: AC

## 2014-05-01 MED FILL — Fentanyl Citrate Inj 0.05 MG/ML: INTRAMUSCULAR | Qty: 4 | Status: AC

## 2014-05-01 MED FILL — Ondansetron HCl Inj 4 MG/2ML (2 MG/ML): INTRAMUSCULAR | Qty: 2 | Status: AC

## 2014-05-01 NOTE — Consult Note (Signed)
Patient VQ:[id number] NIGERIA LASSETER      DOB: 1960-09-07      OVF:[id number]     Consult Note from the Palliative Medicine Team at [facility] Requested by: Dr. Sloan Leiter     PCP: Celedonio Savage, MD Reason for Consultation: [place], hospice     Phone [phone]  Assessment of patients Current state: I met today with [name] and her husband at bedside. Her sister-in-law is present but not participant in discussion. [name] is lethargic and somewhat confused and refers to her husband for any decisions. I discussed with her husband, Inocente Salles, who tells me that he knew this day would come and that she was not doing well the past few months. They have talked as a family and understand that there are no good options and are prepared for comfort. We discussed comfort and hospice and they agree to hospice facility.    Goals of Care: 1.  Code Status: DNR   2. Scope of Treatment: Comfort measures.    4. Disposition: Hopeful for hospice facility.    3. Symptom Management:   1. Anxiety: Recommend Lorazepam 0.5 mg every 4 hours prn.  2. Pain/dyspnea: Recommend Roxanol 5-10 mg every hour prn.  3. Nausea/Vomiting: Recommend Zofran 4 mg every 8 hours prn.  4. Consider IR to place drain for continued drainage of ascites.   4. Psychosocial: Emotional support provided to patient and family.    Brief HPI: 54 yo female admitted with general weakness r/t liver cirrhosis complicated by acute renal failure with extremely poor prognosis. They have chosen to focus on comfort measures and will pursue hospice care. PMH reviewed below.    ROS: + generalized pain, + pressure/fullness in abdomen, + nausea    PMH:  Past Medical History  Diagnosis Date  . Guillain-Barre   . Blind   . Neuropathy   . Thyroid disease   . Back pain   . Arthritis   . Seizures   . UTI (lower urinary tract infection)   . Anxiety   . Depression   . DDD (degenerative disc disease), lumbar   . Hepatitis C antibody test  positive     HCV RNA is negative 03/2013  . Mild diastolic dysfunction 8/41/6606  . Esophagitis, erosive 03/2014  . Hepatic steatosis 04/23/2013  . Hypomagnesemia 04/23/2013  . Cirrhosis of liver without mention of alcohol     ct on 06/06/13= Subtle nodular contour of the liver. Cirrhosis cannot be excluded.   . PUD (peptic ulcer disease)   . Tubular adenoma   . Chronic abdominal pain   . Drug-seeking behavior     per [facility] admission records 01/04/14  . Chronic hypotension   . History of ETOH abuse      PSH: Past Surgical History  Procedure Laterality Date  . Cholecystectomy    . Tubal ligation    . Femur fracture surgery    . Ankle surgery    . Back surgery    . Esophagogastroduodenoscopy N/A 03/21/2013    Dr. Gala Romney: severe erosive reflux esophagitis, hiatal hernia, gastric ulcer with element of partial gastric outlet obstruction secondary to ulcer effect. Likely r/t NSAIDs. negative H.pylori  . Colonoscopy with esophagogastroduodenoscopy (egd) N/A 06/24/2013    Dr. Gala Romney: Erosive reflux esophagitis-much improved over that seen previously. Hiatal hernia. Previously noted gastric ulcer completely healed.  Portal gastropathy/TCS:Rectal varices/anal canal hemorrhoids. Portal colopathy.Colonic diverticulosis. Colonic polyp--tubular adenoma   I have reviewed the FH and SH and  If appropriate update it with new information. Allergies  Allergen Reactions  . Neurontin [Gabapentin] Anaphylaxis  . Acetaminophen Itching and Nausea And Vomiting  . Aspirin     Intolerance because of peptic ulcer  . Eggs Or Egg-Derived Products Nausea And Vomiting  . Tramadol     Nausea and vomiting  . Codeine Itching and Nausea And Vomiting    Patient is able to take morphine, demerol and fentanyl with out any type of side effects   Scheduled Meds: . antiseptic oral rinse  7 mL Mouth Rinse q12n4p  . chlorhexidine  15 mL Mouth Rinse BID  . feeding supplement (RESOURCE BREEZE)  1 Container Oral  TID BM  . furosemide  40 mg Oral Daily  . lactulose  30 g Oral BID  . levothyroxine  50 mcg Oral QAC breakfast  . ondansetron       Continuous Infusions:  PRN Meds:.ipratropium-albuterol, LORazepam, morphine injection, morphine CONCENTRATE, ondansetron (ZOFRAN) IV, promethazine    BP 112/70 mmHg  Pulse 109  Temp(Src) 98.2 F (36.8 C) (Axillary)  Resp 14  Ht _0  (1.626 m)  Wt 83.8 kg (184 lb 11.9 oz)  BMI 31.70 kg/m2  SpO2 95%   PPS: 20% at best   Intake/Output Summary (Last 24 hours) at 05/01/14 1120 Last data filed at 05/01/14 0931  Gross per 24 hour  Intake 756.58 ml  Output   1950 ml  Net -1193.42 ml    Physical Exam:  General: NAD, obese HEENT: [place]/AT, moist mucous membranes Chest: No labored breathing, symmetric CVS: RRR Abdomen: Soft, distended, + ascites Ext: MAE, BLE trace edema, warm to touch Neuro: Lethargic, confused in conversation  Labs: CBC    Component Value Date/Time   WBC 5.1 05/01/2014 0450   RBC 2.94* 05/01/2014 0450   RBC 2.16* 01/07/2014 0622   HGB 9.3* 05/01/2014 0450   HCT 27.1* 05/01/2014 0450   PLT 80* 05/01/2014 0450   MCV 92.2 05/01/2014 0450   MCH 31.6 05/01/2014 0450   MCHC 34.3 05/01/2014 0450   RDW 16.2* 05/01/2014 0450   LYMPHSABS 1.2 04/28/2014 1745   MONOABS 0.5 04/28/2014 1745   EOSABS 0.1 04/28/2014 1745   BASOSABS 0.0 04/28/2014 1745    BMET    Component Value Date/Time   NA 139 05/01/2014 0450   K 3.2* 05/01/2014 0450   CL 112 05/01/2014 0450   CO2 19 05/01/2014 0450   GLUCOSE 105* 05/01/2014 0450   BUN 32* 05/01/2014 0450   CREATININE 1.66* 05/01/2014 0450   CALCIUM 8.9 05/01/2014 0450   GFRNONAA 34* 05/01/2014 0450   GFRAA 40* 05/01/2014 0450    CMP     Component Value Date/Time   NA 139 05/01/2014 0450   K 3.2* 05/01/2014 0450   CL 112 05/01/2014 0450   CO2 19 05/01/2014 0450   GLUCOSE 105* 05/01/2014 0450   BUN 32* 05/01/2014 0450   CREATININE 1.66* 05/01/2014 0450   CALCIUM 8.9  05/01/2014 0450   PROT 5.9* 04/30/2014 0345   ALBUMIN 3.5 04/30/2014 0345   AST 26 04/30/2014 0345   ALT 10 04/30/2014 0345   ALKPHOS 56 04/30/2014 0345   BILITOT 1.8* 04/30/2014 0345   GFRNONAA 34* 05/01/2014 0450   GFRAA 40* 05/01/2014 0450    60 minutes   Greater than 50%  of this time was spent counseling and coordinating care related to the above assessment and plan.  Vinie Sill, NP Palliative Medicine Team Pager # [phone] (M-F 8a-5p) Team Phone # [phone] (  Nights/Weekends)

## 2014-05-01 NOTE — Clinical Social Work Note (Signed)
Spoke to patient and husband, patient will be going to residential [facility].  Palliative nurse spoke to patient's husband about care plan.  Patient's husband in agreement to residential hospice due to husband working all day and can not take care of her.  Jones Broom. [place], MSW, [place] 05/01/2014 4:23 PM

## 2014-05-01 NOTE — Clinical Social Work Psychosocial (Addendum)
Clinical Social Work Department BRIEF PSYCHOSOCIAL ASSESSMENT 05/01/2014  Patient:  STEPHANYE, FINNICUM     Account Number:  [id number]     Admit date:  04/28/2014  Clinical Social Worker:  Dian Queen  Date/Time:  05/01/2014 04:11 PM  Referred by:  Physician  Date Referred:  05/01/2014 Referred for  Residential hospice placement   Other Referral:   Interview type:  Family Other interview type:    PSYCHOSOCIAL DATA Living Status:  FAMILY Admitted from facility:   Level of care:   Primary support name:  Sam Stubblefield Primary support relationship to patient:  SPOUSE Degree of support available:   Patient lives with her husband, but now her health has deteriorated.  Patient will be going to [facility] [place].    CURRENT CONCERNS Current Concerns  Post-Acute Placement   Other Concerns:    SOCIAL WORK ASSESSMENT / PLAN Patient was not able to express how she is feeling. Patient is a 54 year old female who lives with her husband. Patient's health has deteriorated, palliative care and CSW have spoken with husband, and plan is to discharge to [facility] [place].  Patient and husband are agreeable to this decision.  Patient will discharge to hospice residential facility once discharge orders have been received.   Assessment/plan status:  Psychosocial Support/Ongoing Assessment of Needs Other assessment/ plan:   Information/referral to community resources:    PATIENT'S/FAMILY'S RESPONSE TO PLAN OF CARE: Patient and husband agreeable to discharge to residential hospice facility.   Jones Broom. [place], MSW, [place] 05/01/2014 4:19 PM

## 2014-05-01 NOTE — Clinical Social Work Note (Signed)
Notified patient's husband to inform him that patient has been picked up from her room and is on route to hospice home.  Patient's husband Sam grateful for help being provided for patient.  Jones Broom. [place], MSW, [place] 05/01/2014 4:24 PM

## 2014-05-01 NOTE — Discharge Summary (Signed)
PATIENT DETAILS Name: [name] Age: 54 y.o. Sex: female Date of Birth: Oct 25, 1960 MRN: [id number]. Admitting Physician: Theressa Millard, MD [id number], Selinda Flavin, MD  Admit Date: 04/28/2014 Discharge date: 05/01/2014  Recommendations for Outpatient Follow-up:  1. Optimize comfort medications.  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 Guillain-Barre   . Blind   . Neuropathy   . Thyroid disease   . Back pain   . Arthritis   . Seizures   . UTI (lower urinary tract infection)   . Anxiety   . Depression   . DDD (degenerative disc disease), lumbar   . Hepatitis C antibody test positive     HCV RNA is negative 03/2013  . Mild diastolic dysfunction 10/09/3149  . Esophagitis, erosive 03/2014  . Hepatic steatosis 04/23/2013  . Hypomagnesemia 04/23/2013  . Cirrhosis of liver without mention of alcohol     ct on 06/06/13= Subtle nodular contour of the liver. Cirrhosis cannot be excluded.   . PUD (peptic ulcer disease)   . Tubular adenoma   . Chronic abdominal pain   . Drug-seeking behavior     per [facility] admission records 01/04/14  . Chronic hypotension   . History of ETOH abuse     DISCHARGE MEDICATIONS: Current Discharge Medication List    START taking these medications   Details  LORazepam (ATIVAN) 2 MG/ML concentrated solution Take 0.5 mLs (1 mg total) by mouth every 4 (four) hours as needed for anxiety or sleep. Qty: 30 mL, Refills: 0    !! Morphine Sulfate (MORPHINE CONCENTRATE) 10 mg / 0.5 ml concentrated solution Take 1 mL (20 mg total) by mouth every 6 (six) hours. Refills: 0    !! Morphine Sulfate (MORPHINE CONCENTRATE) 10 MG/0.5ML SOLN concentrated solution Take 1 mL (20 mg total) by mouth  every 2 (two) hours as needed for moderate pain, anxiety or shortness of breath. Qty: 30 mL, Refills: 0     !! - Potential duplicate medications found. Please discuss with provider.    CONTINUE these medications which have NOT CHANGED   Details  furosemide (LASIX) 40 MG tablet Take 1 tablet (40 mg total) by mouth every morning. Qty: 30 tablet, Refills: 0    ipratropium-albuterol (DUONEB) 0.5-2.5 (3) MG/3ML SOLN Take 3 mLs by nebulization every 6 (six) hours as needed (for shortness of breath).     lactulose (CHRONULAC) 10 GM/15ML solution TAKE TWO TABLESPOONSFUL (30 ML) BY MOUTH TWICE DAILY Qty: 2400 mL, Refills: 5    levothyroxine (SYNTHROID, LEVOTHROID) 50 MCG tablet Take 50 mcg by mouth daily before breakfast.    pantoprazole (PROTONIX) 40 MG tablet Take 1 tablet (40 mg total) by mouth 2 (two) times daily before a meal. Qty: 60 tablet, Refills: 0      STOP taking these medications     cephALEXin (KEFLEX) 500 MG capsule      clotrimazole (LOTRIMIN) 1 % cream      cyanocobalamin (,VITAMIN B-12,) 1000 MCG/ML injection      pregabalin (LYRICA) 50 MG capsule      spironolactone (ALDACTONE) 100 MG tablet      thiamine 100 MG tablet      hydrocortisone (ANUSOL-HC)  25 MG suppository      morphine (MSIR) 15 MG tablet         ALLERGIES:   Allergies  Allergen Reactions  . Neurontin [Gabapentin] Anaphylaxis  . Acetaminophen Itching and Nausea And Vomiting  . Aspirin     Intolerance because of peptic ulcer  . Eggs Or Egg-Derived Products Nausea And Vomiting  . Tramadol     Nausea and vomiting  . Codeine Itching and Nausea And Vomiting    Patient is able to take morphine, demerol and fentanyl with out any type of side effects    BRIEF HPI:  See H&P, Labs, Consult and Test reports for all details in brief, patient is a 54 year old female with a history of alcoholic liver disease, chronic lower extremity weakness who was admitted for decreased urine output, generalized  weakness and fatigue.  CONSULTATIONS:   pulmonary/intensive care and nephrology  PERTINENT RADIOLOGIC STUDIES: US Paracentesis  04/30/2014   INDICATION: Patient with history of hepatitis-C, cirrhosis, hypotension, renal insufficiency, encephalopathy, recurrent ascites. Request is made for therapeutic paracentesis up to 5 liters.  EXAM: ULTRASOUND-GUIDED THERAPEUTIC PARACENTESIS  COMPARISON:  Prior paracentesis on 04/29/2014.  MEDICATIONS: None.  COMPLICATIONS: None immediate  TECHNIQUE: Informed written consent was obtained from the patient after a discussion of the risks, benefits and alternatives to treatment. A timeout was performed prior to the initiation of the procedure.  Initial ultrasound scanning demonstrates a large amount of ascites within the right lower abdominal quadrant. The right lower abdomen was prepped and draped in the usual sterile fashion. 1% lidocaine was used for local anesthesia. Under direct ultrasound guidance, a 19 gauge, 15-cm, Yueh catheter was introduced. An ultrasound image was saved for documentation purposed. The paracentesis was performed. The catheter was removed and a dressing was applied. The patient tolerated the procedure well without immediate post procedural complication.  FINDINGS: A total of approximately 5 liters of yellow fluid was removed.  IMPRESSION: Successful ultrasound-guided therapeutic paracentesis yielding 5 liters of peritoneal fluid.  Read by: Rowe Robert, PA-C   Electronically Signed   By: Markus Daft M.D.   On: 04/30/2014 12:44   US Paracentesis  04/29/2014   INDICATION: Patient with history of hepatitis-C, cirrhosis, hypotension, renal insufficiency, recurrent ascites. Request is made for diagnostic and therapeutic paracentesis up to 2 liters.  EXAM: ULTRASOUND-GUIDED DIAGNOSTIC AND THERAPEUTIC PARACENTESIS  COMPARISON:  Prior paracentesis on 01/10/2014  MEDICATIONS: None.  COMPLICATIONS: None immediate  TECHNIQUE: Informed written consent was  obtained from the patient after a discussion of the risks, benefits and alternatives to treatment. A timeout was performed prior to the initiation of the procedure.  Initial ultrasound scanning demonstrates a large amount of ascites within the right lower abdominal quadrant. The right lower abdomen was prepped and draped in the usual sterile fashion. 1% lidocaine was used for local anesthesia. Under direct ultrasound guidance, a 19 gauge, 7-cm, Yueh catheter was introduced. An ultrasound image was saved for documentation purposed. The paracentesis was performed. The catheter was removed and a dressing was applied. The patient tolerated the procedure well without immediate post procedural complication.  FINDINGS: A total of approximately 2 liters of yellow fluid was removed. Samples were sent to the laboratory as requested by the clinical team.  IMPRESSION: Successful ultrasound-guided diagnostic and therapeutic paracentesis yielding 2 liters of peritoneal fluid.  Read by: Rowe Robert, PA-C   Electronically Signed   By: Markus Daft M.D.   On: 04/29/2014 12:55   Dg Chest Port 1 View  04/28/2014  CLINICAL DATA:  Weakness, shortness of breath and confusion for several days.  EXAM: PORTABLE CHEST - 1 VIEW  COMPARISON:  Single view of the dens 02/20/2014 and 01/11/2014.  FINDINGS: Lungs are clear. Heart size is normal. There is no pneumothorax or pleural effusion.  IMPRESSION: Negative exam.   Electronically Signed   By: Inge Rise M.D.   On: 04/28/2014 18:50     PERTINENT LAB RESULTS: CBC:  Recent Labs  04/30/14 0345 05/01/14 0450  WBC 3.5* 5.1  HGB 5.8* 9.3*  HCT 16.7* 27.1*  PLT 78* 80*   CMET CMP     Component Value Date/Time   NA 139 05/01/2014 0450   K 3.2* 05/01/2014 0450   CL 112 05/01/2014 0450   CO2 19 05/01/2014 0450   GLUCOSE 105* 05/01/2014 0450   BUN 32* 05/01/2014 0450   CREATININE 1.66* 05/01/2014 0450   CALCIUM 8.9 05/01/2014 0450   PROT 5.9* 04/30/2014 0345    ALBUMIN 3.5 04/30/2014 0345   AST 26 04/30/2014 0345   ALT 10 04/30/2014 0345   ALKPHOS 56 04/30/2014 0345   BILITOT 1.8* 04/30/2014 0345   GFRNONAA 34* 05/01/2014 0450   GFRAA 40* 05/01/2014 0450    GFR Estimated Creatinine Clearance: 41 mL/min (by C-G formula based on Cr of 1.66).  Recent Labs  04/28/14 1745  LIPASE 25   No results for input(s): CKTOTAL, CKMB, CKMBINDEX, TROPONINI in the last 72 hours. Invalid input(s): POCBNP No results for input(s): DDIMER in the last 72 hours. No results for input(s): HGBA1C in the last 72 hours. No results for input(s): CHOL, HDL, LDLCALC, TRIG, CHOLHDL, LDLDIRECT in the last 72 hours.  Recent Labs  04/30/14 0345  TSH 0.914   No results for input(s): VITAMINB12, FOLATE, FERRITIN, TIBC, IRON, RETICCTPCT in the last 72 hours. Coags:  Recent Labs  04/28/14 1745  INR 1.18   Microbiology: Recent Results (from the past 240 hour(s))  Culture, blood (routine x 2)     Status: None (Preliminary result)   Collection Time: 04/28/14  5:45 PM  Result Value Ref Range Status   Specimen Description BLOOD LEFT HAND  Final   Special Requests BOTTLES DRAWN AEROBIC ONLY 5CC  Final   Culture NO GROWTH 2 DAYS  Final   Report Status PENDING  Incomplete  Culture, blood (routine x 2)     Status: None (Preliminary result)   Collection Time: 04/28/14  6:33 PM  Result Value Ref Range Status   Specimen Description BLOOD LEFT HAND  Final   Special Requests BOTTLES DRAWN AEROBIC AND ANAEROBIC 5CC  Final   Culture NO GROWTH 2 DAYS  Final   Report Status PENDING  Incomplete  Urine culture     Status: None (Preliminary result)   Collection Time: 04/28/14  6:49 PM  Result Value Ref Range Status   Specimen Description URINE, CLEAN CATCH  Final   Special Requests NONE  Final   Colony Count   Final    75,000 COLONIES/ML Performed at [facility]    Culture   Final    [place] Performed at [facility]    Report Status PENDING   Incomplete  MRSA PCR Screening     Status: None   Collection Time: 04/29/14  1:26 AM  Result Value Ref Range Status   MRSA by PCR NEGATIVE NEGATIVE Final    Comment:        The GeneXpert MRSA Assay (FDA approved for NASAL specimens only), is one  component of a comprehensive MRSA colonization surveillance program. It is not intended to diagnose MRSA infection nor to guide or monitor treatment for MRSA infections.   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 Unfortunately, irrespective of etiology, given ongoing hypotension, overall clinical deterioration over the past few months, suspect that her prognosis is very grim. Per nephrology, she is not a candidate for long-term dialysis. Seen by pulmonary critical care on 04/29/14, after explaining to patient and family that patient would need invasive hemodynamic monitoring with central line, pressors and likely intubated for ventilator, both patient and husband decided against these aggressive measures. She was then made a DO NOT RESUSCITATE.After a prolonged discussion with patient, and husband, we have now decided to mostly focus on comfort. She is now being discharged to a residential hospice.   Hepatic encephalopathy: Continue with lactulose as much as possible, still  encephalopathic.    Hypotension: Reviewed prior hospitalization records, patient seems to have chronic hypotension. Suspect this has worsened from  diuretic and narcotic use. Cortisol level on 9/30 appropriate. Suspect that this is also causing her to go into renal failure. Started on intravenous albumin and midodrine with very limited success, blood pressure continues to be soft and patient continues to have intermittent hypotension. Since we have transitioned to comfort care, we will start narcotics to make patient comfortable. We will no longer be using midodrine as focus is now for comfort care.   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 Ascites fluid analysis not consistent with as BP, and all antibiotics now discontinued. Since continues to deteriorate, and has rapid accumulation of ascites, underwent 5 L of paracentesis on 04/30/14. Focuses mostly on comfort, okay for large volume paracentesis. Evem soft BP, will use Lasix to see if we can keep the fluids off-primarily for comfort.   ?Sepsis: Not sure of presentation is consistent with sepsis, UA not a good sample. Ascites fluid analysis not consistent with SBP, so antibiotics discontinued. Focuses mostly on comfort.    Anemia: No overt evidence of blood loss, however think that this is mostly secondary to worsening renal function, acute illness. Transfuse 2 units of PRBC on 1/17, since now being transitioned to comfort care, no further transfusion will be done. Recent endoscopy on 06/2013 showed no esophageal varices.    Stage II decubitus ulcer: Supportive care.   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 Was supposed to see neurology as an outpatient. Is able to move bilateral leg side-to-side, and able to lift against gravity. No further workup required as now comfort  care.   Palliative care/ethics: -Seen by PCCM on 04/29/14 after  explaining to patient and family that patient would need invasive hemodynamic monitoring with central line, pressors and likely intubated for ventilator, both patient and husband decided against these aggressive measures. She was then made a DO NOT RESUSCITATE. -After bedside discussion with husband, and patient by this M.D. on 04/30/14 we have decided on the following -Focus mostly on comfort and symptom management -Will start narcotics to alleviate pain (has chronic pain) irrespective of hypotension -Large volume paracentesis as needed for tense ascites irrespective of renal function and hypotension -Minimize some of her medications   -Comfort care at residential hospice   -DNR   TODAY-DAY OF DISCHARGE:  Subjective:   Angelina Ok today continues to have some mild confusion and encephalopathy. Very poor appetite. But otherwise remains stable  Objective:   Blood pressure 112/70, pulse 109, temperature 98.2 F (36.8 C), temperature source Axillary, resp. rate 14, height 5\' 4"  (1.626 m), weight 83.8 kg (184 lb 11.9 oz), SpO2 95 %.  Intake/Output Summary (Last 24 hours) at 05/01/14 1236 Last data filed at 05/01/14 0931  Gross per 24 hour  Intake  407.5 ml  Output   1650 ml  Net -1242.5 ml   Filed Weights   04/28/14 1733 04/29/14 0000 04/30/14 0315  Weight: 72.576 kg (160 lb) 81.5 kg (179 lb 10.8 oz) 83.8 kg (184 lb 11.9 oz)    Exam Awake but encephalopathic and confused.  [place].AT,PERRAL Supple Neck,No JVD, No cervical lymphadenopathy appriciated.  Wheezing scattered all over. RRR,No Gallops,Rubs or new Murmurs, No Parasternal Heave +ve B.Sounds, Abd Soft, Non tender, some moderate amount of ascites present  No Cyanosis, Clubbing or edema, No new Rash or bruise  DISCHARGE  CONDITION: Stable  DISPOSITION: Residential Hospice  DISCHARGE INSTRUCTIONS:    Activity:  As tolerated with Full fall precautions use walker/cane & assistance as needed  Diet recommendation: Heart Healthy diet  Discharge Instructions    Diet - low sodium heart healthy    Complete by:  As directed      Increase activity slowly    Complete by:  As directed            Follow-up Information    Follow up with Celedonio Savage, MD.   Specialty:  Family Medicine   Why:  As needed   Contact information:   [place] [address] [phone]      Total Time spent on discharge equals 45 minutes.  SignedOren Binet 05/01/2014 12:36 PM

## 2014-05-01 NOTE — Progress Notes (Signed)
Report called to Sena Hitch, RN at [facility]. Await transport later.

## 2014-05-02 LAB — URINE CULTURE: Colony Count: 75000

## 2014-05-03 LAB — BODY FLUID CULTURE: CULTURE: NO GROWTH

## 2014-05-03 LAB — CULTURE, BLOOD (ROUTINE X 2)
CULTURE: NO GROWTH
Culture: NO GROWTH

## 2014-05-15 DEATH — deceased

## 2015-03-01 IMAGING — CT CT ABD-PELV W/ CM
2 of 5 series · 16 of 46 positions shown, 18 images · IV contrast (omnipaque)
Comparison: 04/29/2012

CLINICAL DATA: Fall 1 week ago with right-sided pain.

EXAM:
CT ABDOMEN AND PELVIS WITH CONTRAST
TECHNIQUE: Multidetector CT imaging of the abdomen and pelvis was performed
using the standard protocol following bolus administration of
intravenous contrast.
CONTRAST:  100mL OMNIPAQUE IOHEXOL 300 MG/ML  SOLN

[Series 2: abd_pel_with 5.0 b40f · axial · 0.76mm/px · z∈[-422,-12]mm · 13 of 92 slices shown, 15 images]
[im 5/92  soft-tissue]
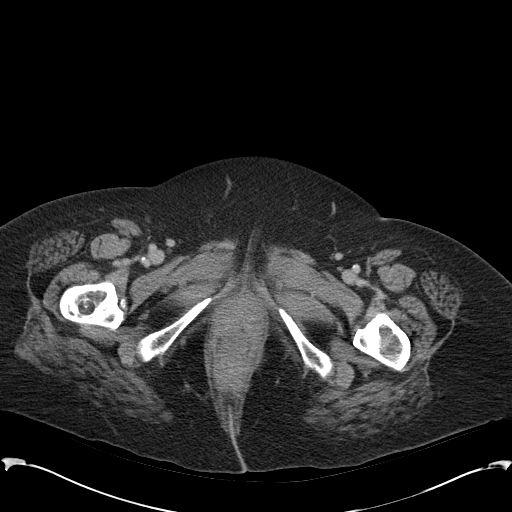
[im 5/92  bone]
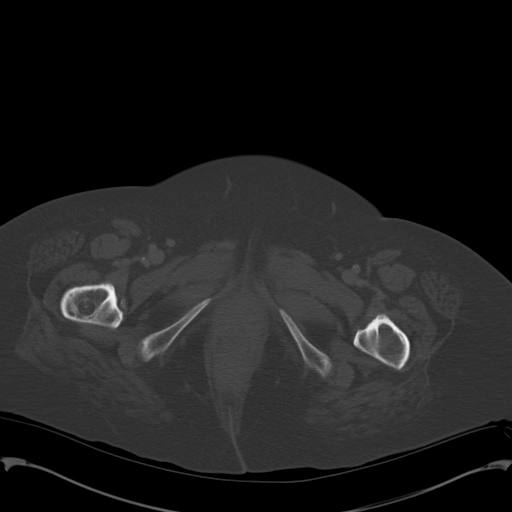
[im 15/92  soft-tissue]
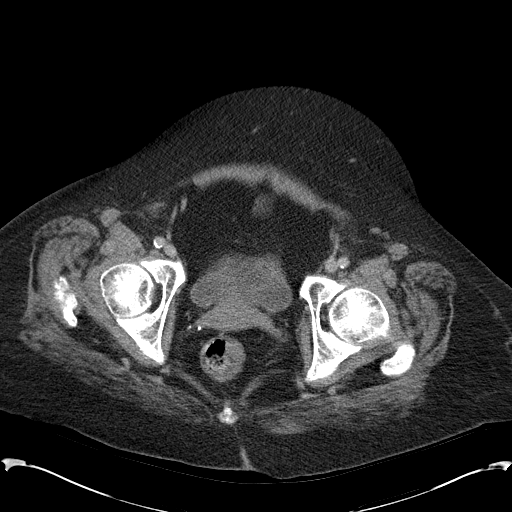
[im 20/92  soft-tissue]
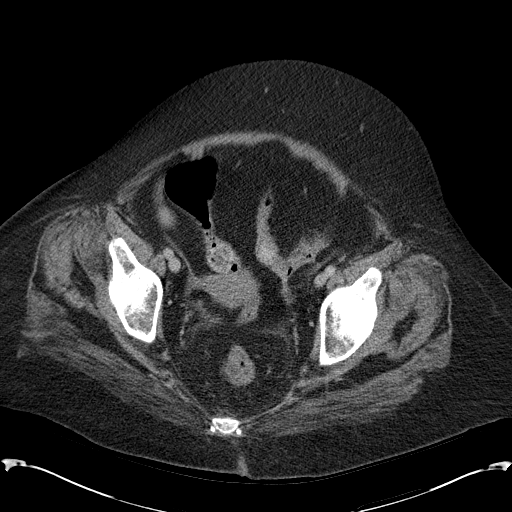
[im 24/92  soft-tissue]
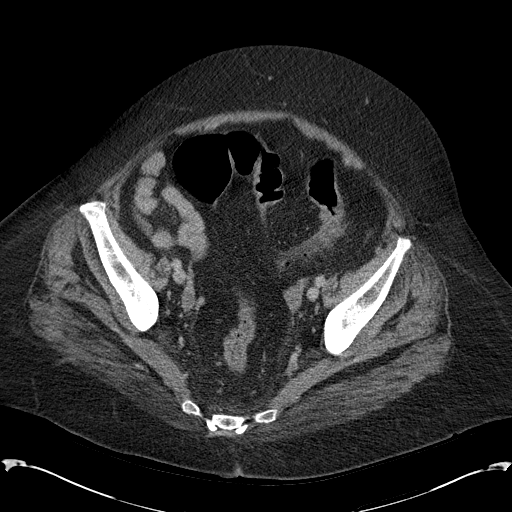
[im 34/92  soft-tissue]
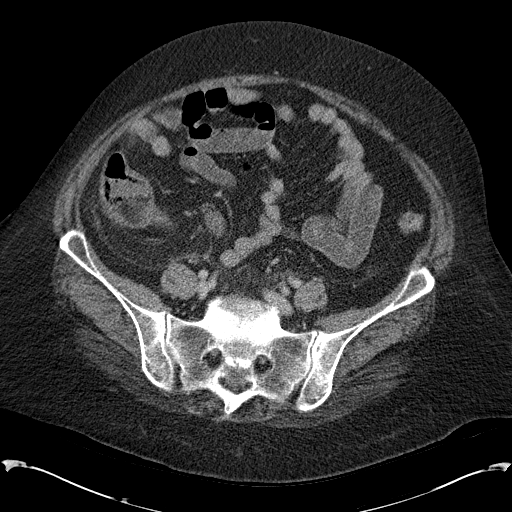
[im 39/92  soft-tissue]
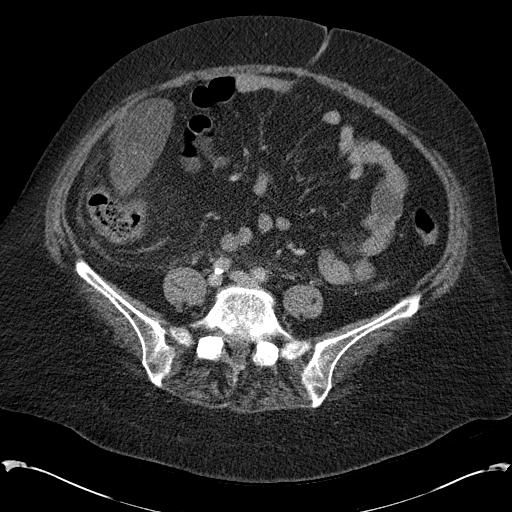
[im 48/92  soft-tissue]
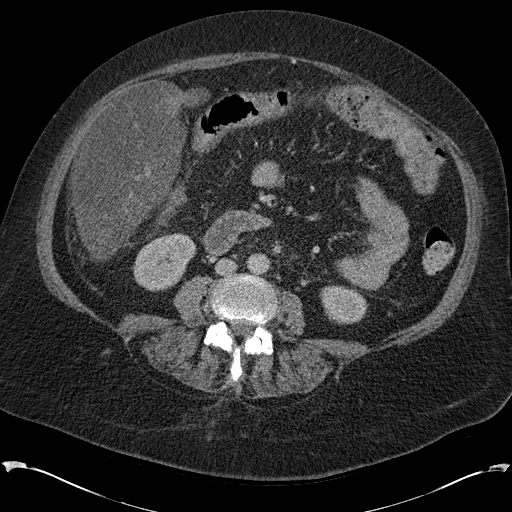
[im 53/92  soft-tissue]
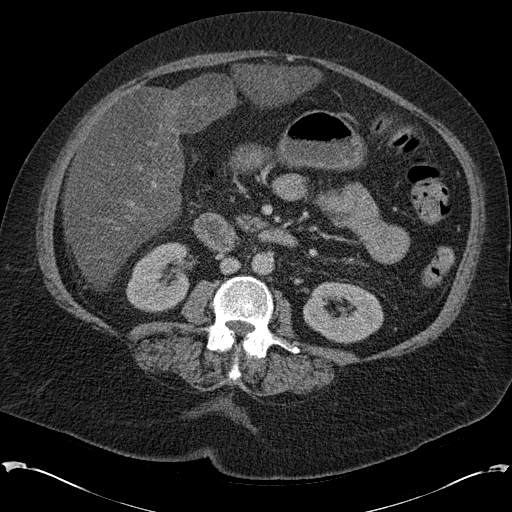
[im 58/92  soft-tissue]
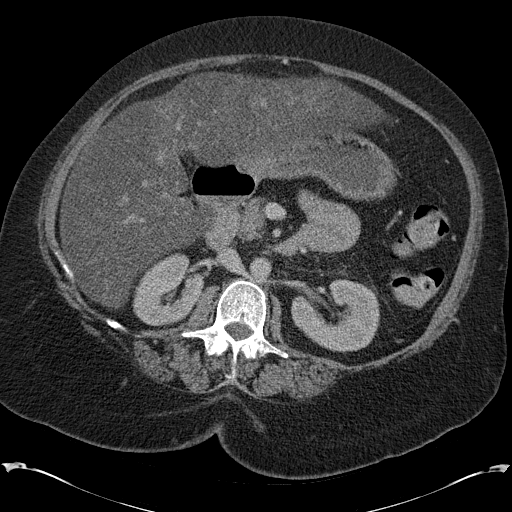
[im 58/92  bone]
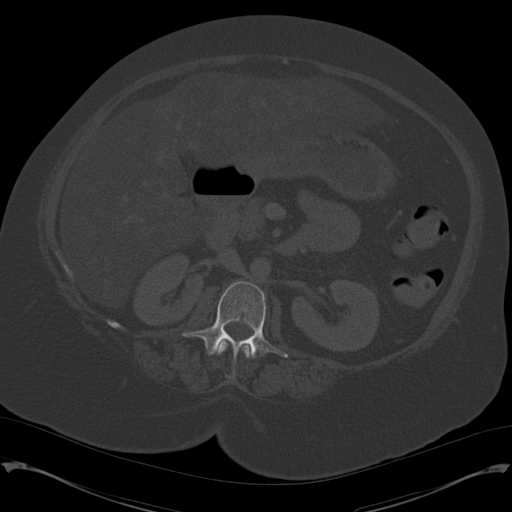
[im 68/92  soft-tissue]
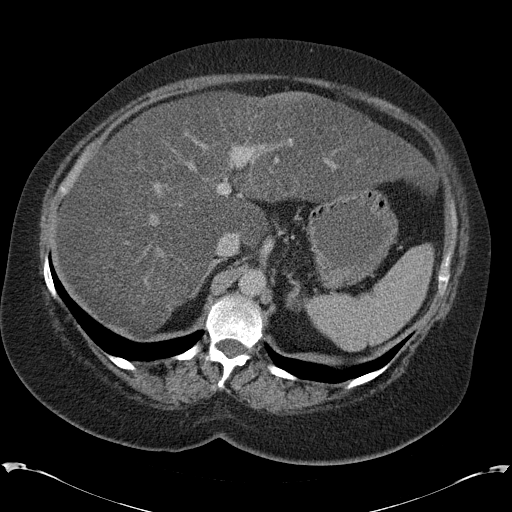
[im 72/92  soft-tissue]
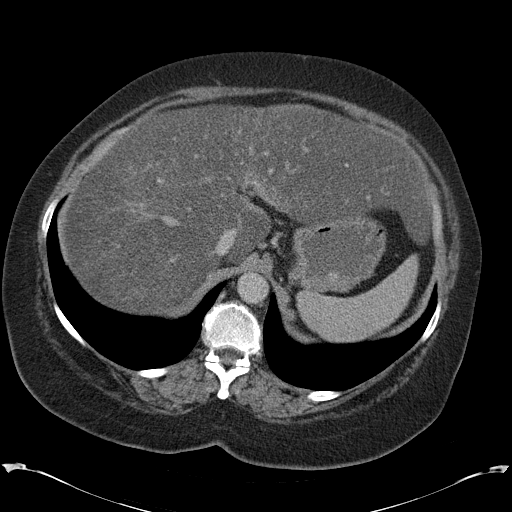
[im 77/92  soft-tissue]
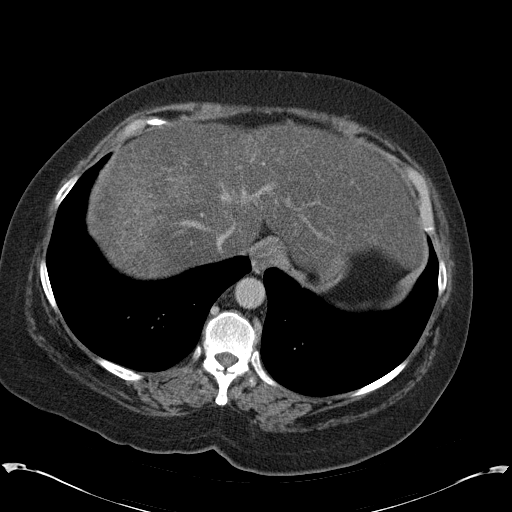
[im 87/92  soft-tissue]
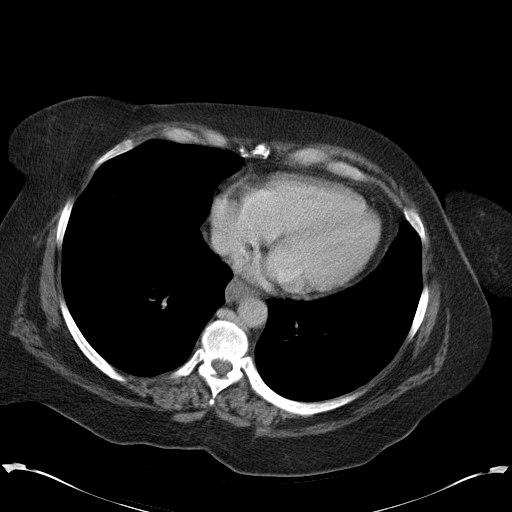

[Series 4: abd_pel_with 3.0 spo · coronal · 0.71mm/px · 3 of 110 slices shown]
[im 37/110  soft-tissue]
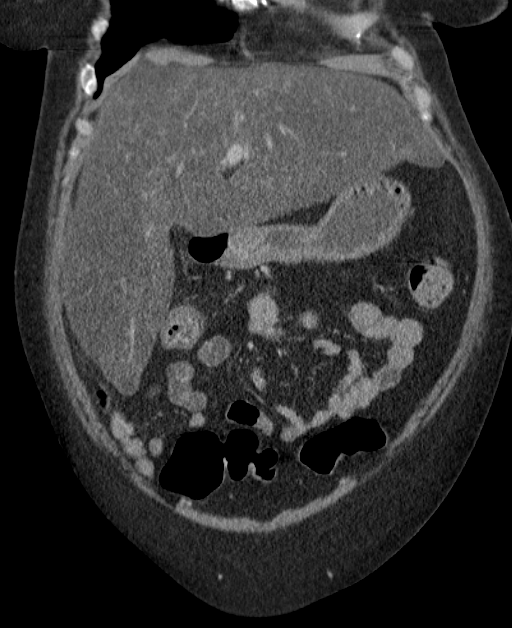
[im 49/110  soft-tissue]
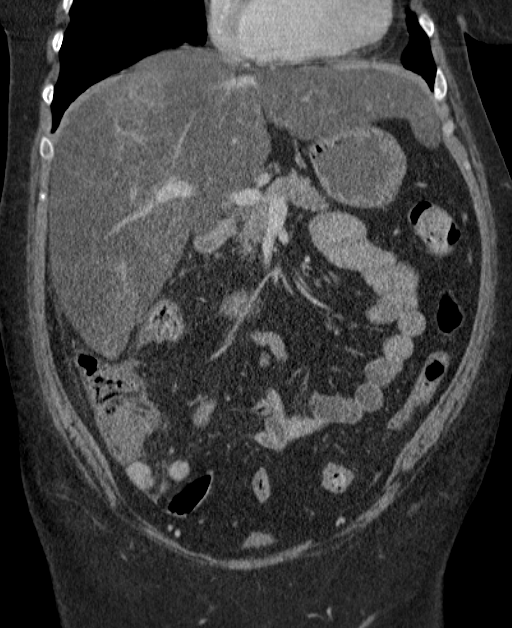
[im 61/110  soft-tissue]
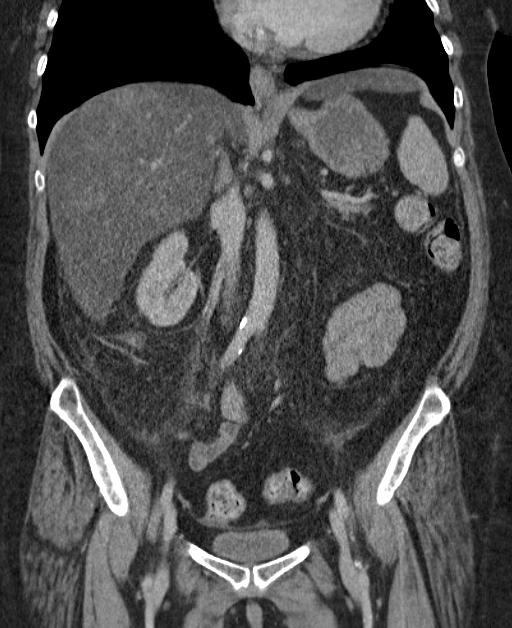

[16 of 46 positions shown; findings below may reference images not displayed]

FINDINGS: Lung bases are clear.  Negative for free air.

There is diffuse low-attenuation of the liver consistent with
hepatic steatosis. Liver is large for size. Scattered areas of fatty
sparing throughout the liver. The gallbladder has been removed.
Normal appearance of the spleen, adrenal glands, pancreas and both
kidneys. There is chronic stranding in the retroperitoneum and right
paracolic gutter. Incidentally, the patient has a retroaortic left
renal vein. Stable stranding around the root of the IMA. No
significant free fluid or lymphadenopathy. Atherosclerotic
calcifications in the aorta and visceral vessels without aneurysmal
dilatation. No gross abnormality to the uterus or adnexa tissue.
Small amount of fluid in the urinary bladder. No gross abnormality
to the appendix. No acute bone abnormality.
IMPRESSION: Hepatomegaly with diffuse low-attenuation of the liver. Findings are
suggestive for hepatic steatosis. Cirrhosis cannot be excluded.

There is chronic stranding in the abdomen, particularly in the
retroperitoneum. Findings could represent prior inflammation or
fibrosis.

## 2015-07-23 ENCOUNTER — Encounter: Payer: Self-pay | Admitting: Physical Medicine & Rehabilitation
